# Patient Record
Sex: Male | Born: 1960 | Race: Black or African American | Hispanic: No | Marital: Single
Health system: Southern US, Community
[De-identification: ages and names within clinical notes are randomized; demographics above are authoritative.]

## PROBLEM LIST (undated history)

## (undated) DIAGNOSIS — I82409 Acute embolism and thrombosis of unspecified deep veins of unspecified lower extremity: Secondary | ICD-10-CM

## (undated) DIAGNOSIS — I10 Essential (primary) hypertension: Secondary | ICD-10-CM

## (undated) HISTORY — DX: Acute embolism and thrombosis of unspecified deep veins of unspecified lower extremity: I82.409

## (undated) HISTORY — DX: Essential (primary) hypertension: I10

---

## 2001-01-06 ENCOUNTER — Emergency Department (HOSPITAL_COMMUNITY): Admission: EM | Admit: 2001-01-06 | Discharge: 2001-01-06 | Payer: Self-pay

## 2002-10-10 ENCOUNTER — Encounter: Payer: Self-pay | Admitting: Emergency Medicine

## 2002-10-10 ENCOUNTER — Emergency Department (HOSPITAL_COMMUNITY): Admission: EM | Admit: 2002-10-10 | Discharge: 2002-10-10 | Payer: Self-pay | Admitting: Emergency Medicine

## 2017-12-03 ENCOUNTER — Emergency Department (HOSPITAL_COMMUNITY)
Admission: EM | Admit: 2017-12-03 | Discharge: 2017-12-03 | Disposition: A | Discharge status: Home / Self Care | Payer: Managed Care, Other (non HMO) | Attending: Physician Assistant | Admitting: Physician Assistant

## 2017-12-03 ENCOUNTER — Ambulatory Visit (HOSPITAL_BASED_OUTPATIENT_CLINIC_OR_DEPARTMENT_OTHER)
Admit: 2017-12-03 | Discharge: 2017-12-03 | Disposition: A | Discharge status: Home / Self Care | Payer: Managed Care, Other (non HMO)

## 2017-12-03 ENCOUNTER — Ambulatory Visit (HOSPITAL_COMMUNITY)
Admission: EM | Admit: 2017-12-03 | Discharge: 2017-12-03 | Disposition: A | Discharge status: Nursing Home (Includes ICF) | Payer: Managed Care, Other (non HMO) | Source: Home / Self Care | Attending: Internal Medicine | Admitting: Internal Medicine

## 2017-12-03 ENCOUNTER — Encounter (HOSPITAL_COMMUNITY): Payer: Self-pay | Admitting: Emergency Medicine

## 2017-12-03 ENCOUNTER — Encounter (HOSPITAL_COMMUNITY): Payer: Self-pay | Admitting: Physician Assistant

## 2017-12-03 ENCOUNTER — Other Ambulatory Visit: Payer: Self-pay

## 2017-12-03 DIAGNOSIS — Z833 Family history of diabetes mellitus: Secondary | ICD-10-CM

## 2017-12-03 DIAGNOSIS — I82411 Acute embolism and thrombosis of right femoral vein: Secondary | ICD-10-CM | POA: Insufficient documentation

## 2017-12-03 DIAGNOSIS — Z8249 Family history of ischemic heart disease and other diseases of the circulatory system: Secondary | ICD-10-CM | POA: Insufficient documentation

## 2017-12-03 DIAGNOSIS — F17200 Nicotine dependence, unspecified, uncomplicated: Secondary | ICD-10-CM | POA: Diagnosis not present

## 2017-12-03 DIAGNOSIS — F17210 Nicotine dependence, cigarettes, uncomplicated: Secondary | ICD-10-CM

## 2017-12-03 DIAGNOSIS — M7989 Other specified soft tissue disorders: Secondary | ICD-10-CM

## 2017-12-03 DIAGNOSIS — R2241 Localized swelling, mass and lump, right lower limb: Secondary | ICD-10-CM

## 2017-12-03 DIAGNOSIS — I82441 Acute embolism and thrombosis of right tibial vein: Secondary | ICD-10-CM | POA: Insufficient documentation

## 2017-12-03 DIAGNOSIS — I82431 Acute embolism and thrombosis of right popliteal vein: Secondary | ICD-10-CM

## 2017-12-03 LAB — CBC WITH DIFFERENTIAL/PLATELET
Basophils Absolute: 0.1 10*3/uL (ref 0.0–0.1)
Basophils Relative: 1 %
Eosinophils Absolute: 0.4 10*3/uL (ref 0.0–0.7)
Eosinophils Relative: 4 %
HCT: 41.2 % (ref 39.0–52.0)
Hemoglobin: 13.8 g/dL (ref 13.0–17.0)
Lymphocytes Relative: 37 %
Lymphs Abs: 3.7 10*3/uL (ref 0.7–4.0)
MCH: 29.7 pg (ref 26.0–34.0)
MCHC: 33.5 g/dL (ref 30.0–36.0)
MCV: 88.6 fL (ref 78.0–100.0)
Monocytes Absolute: 0.7 10*3/uL (ref 0.1–1.0)
Monocytes Relative: 7 %
Neutro Abs: 5.3 10*3/uL (ref 1.7–7.7)
Neutrophils Relative %: 51 %
Platelets: 350 10*3/uL (ref 150–400)
RBC: 4.65 MIL/uL (ref 4.22–5.81)
RDW: 13.6 % (ref 11.5–15.5)
WBC: 10 10*3/uL (ref 4.0–10.5)

## 2017-12-03 LAB — COMPREHENSIVE METABOLIC PANEL
ALK PHOS: 67 U/L (ref 38–126)
ALT: 28 U/L (ref 17–63)
ANION GAP: 8 (ref 5–15)
AST: 24 U/L (ref 15–41)
Albumin: 3.1 g/dL — ABNORMAL LOW (ref 3.5–5.0)
BILIRUBIN TOTAL: 0.5 mg/dL (ref 0.3–1.2)
BUN: 9 mg/dL (ref 6–20)
CALCIUM: 8.7 mg/dL — AB (ref 8.9–10.3)
CO2: 27 mmol/L (ref 22–32)
Chloride: 105 mmol/L (ref 101–111)
Creatinine, Ser: 0.88 mg/dL (ref 0.61–1.24)
Glucose, Bld: 88 mg/dL (ref 65–99)
Potassium: 3.6 mmol/L (ref 3.5–5.1)
SODIUM: 140 mmol/L (ref 135–145)
TOTAL PROTEIN: 7.2 g/dL (ref 6.5–8.1)

## 2017-12-03 LAB — PROTIME-INR
INR: 1.04
PROTHROMBIN TIME: 13.5 s (ref 11.4–15.2)

## 2017-12-03 MED ORDER — RIVAROXABAN (XARELTO) VTE STARTER PACK (15 & 20 MG): ORAL_TABLET | ORAL | 0 refills | Status: DC

## 2017-12-03 MED ORDER — RIVAROXABAN 15 MG PO TABS
15.0000 mg | ORAL_TABLET | Freq: Two times a day (BID) | ORAL | Status: DC
  Administered 2017-12-03: 15 mg via ORAL
  Filled 2017-12-03: qty 1

## 2017-12-03 MED ORDER — RIVAROXABAN (XARELTO) EDUCATION KIT FOR DVT/PE PATIENTS
PACK | Freq: Once | Status: AC
  Administered 2017-12-03: 17:00:00
  Filled 2017-12-03: qty 1

## 2017-12-03 NOTE — ED Triage Notes (Addendum)
PT seen at [facility] and sent to ED after vascular US showed....."acute DVT in the right femoral, popliteal, posterior tibial, and peroneal veins."  Reports R groin pain and R leg pain x 2 weeks.  Denies SOB.  Spoke with EDP about pt and orders received.

## 2017-12-03 NOTE — ED Notes (Signed)
Pharmacy to come down to review Xarelto packet with patient

## 2017-12-03 NOTE — ED Provider Notes (Signed)
[place]    CSN: [id number] Arrival date & time: 12/03/17  1204     History   Chief Complaint Chief Complaint  Patient presents with  . Leg Pain    HPI Nahum RAWN QUIROA is a 56 y.o. male.   56 year old male comes in for 1 week history of right leg swelling.  He states that whole right lower leg has been tight, taking Tylenol without relief.  He denies any injuries.  Is able to walk without problems.  He has had recent long travel to [place] in a train.  Current every day smoker, 2 packs/day, about 30 years.  Denies chest pain, shortness of breath, palpitations, weakness, dizziness.  Denies history of heart disease.  Denies erythema, has had increased warmth of the right leg.  Denies fever, chills, night sweats.      History reviewed. No pertinent past medical history.  There are no active problems to display for this patient.   History reviewed. No pertinent surgical history.     Home Medications    Prior to Admission medications   Medication Sig Start Date End Date Taking? Authorizing Provider  Rivaroxaban 15 & 20 MG TBPK Take as directed on package: Start with one 15mg  tablet by mouth twice a day with food. On Day 22, switch to one 20mg  tablet once a day with food. 12/03/17   Mackuen, Fredia Sorrow, MD    Family History Family History  Problem Relation Age of Onset  . Hypertension Mother   . Diabetes Mother     Social History Social History   Tobacco Use  . Smoking status: Current Every Day Smoker  . Smokeless tobacco: Never Used  Substance Use Topics  . Alcohol use: Yes  . Drug use: No     Allergies   Patient has no known allergies.   Review of Systems Review of Systems  Reason unable to perform ROS: See HPI as above.     Physical Exam Triage Vital Signs ED Triage Vitals [12/03/17 1234]  Enc Vitals Group     BP (!) 187/110     Pulse Rate 74     Resp 16     Temp 97.8 F (36.6 C)     Temp Source Oral     SpO2 95 %   Weight      Height      Head Circumference      Peak Flow      Pain Score      Pain Loc      Pain Edu?      Excl. in [place]?    No data found.  Updated Vital Signs BP (!) 187/110 (BP Location: Left Arm) Comment: Notified RN  Pulse 74   Temp 97.8 F (36.6 C) (Oral)   Resp 16   SpO2 95%    Physical Exam  Constitutional: He is oriented to person, place, and time. He appears well-developed and well-nourished. No distress.  HENT:  Head: Normocephalic and atraumatic.  Eyes: Conjunctivae are normal. Pupils are equal, round, and reactive to light.  Neck: Normal range of motion. Neck supple.  Cardiovascular: Normal rate, regular rhythm and normal heart sounds. Exam reveals no gallop and no friction rub.  No murmur heard. Pulmonary/Chest: Effort normal and breath sounds normal. He has no wheezes. He has no rales.  Musculoskeletal:  Right lower leg swelling.  No obvious erythema.  Increased warmth.  No tenderness to palpation.  +2 pitting edema up to the knee.  Full range of motion of knee and ankle.  Strength normal and equal bilaterally.  Sensation intact and equal bilaterally.  Negative Homans.  Right calf-- 18 in (1/3 below knee), 12 in (1/3 above ankle) Left calf-- 16 in (1/3 below knee), 10 in (1/3 above ankle)  Neurological: He is alert and oriented to person, place, and time.  Skin: Skin is warm and dry.     UC Treatments / Results  Labs (all labs ordered are listed, but only abnormal results are displayed) Labs Reviewed - No data to display  EKG  EKG Interpretation None       Radiology No results found.  Procedures Procedures (including critical care time)  Medications Ordered in UC Medications - No data to display   Initial Impression / Assessment and Plan / UC Course  I have reviewed the triage vital signs and the nursing notes.  Pertinent labs & imaging results that were available during my care of the patient were reviewed by me and considered in my  medical decision making (see chart for details).    Given history and exam, will discharge patient for DVT ultrasound.  Informed patient we will give further instructions after DVT results.  Patient expresses understanding and agrees to plan.  Contacted by vascular ultrasound tech, positive DVT results.  Patient informed to go to the emergency department for further evaluation.    Final Clinical Impressions(s) / UC Diagnoses   Final diagnoses:  Right leg swelling    ED Discharge Orders        Ordered    VAS [place] LOWER EXTREMITY VENOUS (DVT)     12/03/17 [place], Amy V, PA-C 12/03/17 1644

## 2017-12-03 NOTE — Discharge Instructions (Addendum)
You saw our case management, you will need to follow-up with them for primary care physician appointment.  Will be given the first little bit of Xarelto.  Please follow the instructions about this medication very carefully.  Do not take medication such as ibuprofen.  Follow-up with your primary care within 2 weeks.  Information on my medicine - XARELTO (rivaroxaban)  This medication education was reviewed with me or my healthcare representative as part of my discharge preparation.  WHY WAS XARELTO PRESCRIBED FOR YOU? Xarelto was prescribed to treat blood clots that may have been found in the veins of your legs (deep vein thrombosis) or in your lungs (pulmonary embolism) and to reduce the risk of them occurring again.  WHAT DO YOU NEED TO KNOW ABOUT XARELTO? The starting dose is one 15 mg tablet taken TWICE daily with food for the FIRST 21 DAYS then on 1/21  the dose is changed to one 20 mg tablet taken ONCE A DAY with your evening meal.  DO NOT stop taking Xarelto without talking to the health care provider who prescribed the medication.  Refill your prescription for 20 mg tablets before you run out.  After discharge, you should have regular check-up appointments with your healthcare provider that is prescribing your Xarelto.  In the future your dose may need to be changed if your kidney function changes by a significant amount.  WHAT DO YOU DO IF YOU MISS A DOSE? If you are taking Xarelto TWICE DAILY and you miss a dose, take it as soon as you remember. You may take two 15 mg tablets (total 30 mg) at the same time then resume your regularly scheduled 15 mg twice daily the next day.  If you are taking Xarelto ONCE DAILY and you miss a dose, take it as soon as you remember on the same day then continue your regularly scheduled once daily regimen the next day. Do not take two doses of Xarelto at the same time.   IMPORTANT SAFETY INFORMATION Xarelto is a blood thinner medicine that can  cause bleeding. You should call your healthcare provider right away if you experience any of the following: Bleeding from an injury or your nose that does not stop. Unusual colored urine (red or dark brown) or unusual colored stools (red or black). Unusual bruising for unknown reasons. A serious fall or if you hit your head (even if there is no bleeding).  Some medicines may interact with Xarelto and might increase your risk of bleeding while on Xarelto. To help avoid this, consult your healthcare provider or pharmacist prior to using any new prescription or non-prescription medications, including herbals, vitamins, non-steroidal anti-inflammatory drugs (NSAIDs) and supplements.  This website has more information on Xarelto: [email or web address]-benson.[email or web address]

## 2017-12-03 NOTE — Care Management (Signed)
ED CM consulted concerning patient being started on Xarelto and needing to establish primary health care. CM met with patient and wife at bedside patient confirmed information. CM discussed 30 day trail free Xarelto savings card and how to redeem. CM offered assistance with referral to PCP patient is agreeable. CM provided information for the [facility] on [address], Information placed on patient's AVS Patient verbalized understanding and teach back done. No further ED CM needs identified

## 2017-12-03 NOTE — Progress Notes (Signed)
VASCULAR LAB PRELIMINARY  PRELIMINARY  PRELIMINARY  PRELIMINARY  Right lower extremity venous duplex completed.    Preliminary report:  There is acute DVT in the right femoral, popliteal, posterior tibial, and peroneal veins.    Called report to Cathlean Sauer, PA-C who asked for patient to check in to ED.  Danyal Adorno, RVT 12/03/2017, 2:23 PM

## 2017-12-03 NOTE — ED Provider Notes (Signed)
[place] [facility] EMERGENCY DEPARTMENT Provider Note   CSN: [id number] Arrival date & time: 12/03/17  1432     History   Chief Complaint Chief Complaint  Patient presents with  . DVT    HPI Dustin Jones is a 56 y.o. male.  HPI   Patient is a 56 year old male presenting with DVT.  Patient was seen outpatient clinic and sent for ultrasound.  Ultrasound is positive.  Patient had recent 9-hour long trip to [place] on a train.  And then a subsequent 9-hour trip back down 2 days later.  Patient did not walk at all during this change it.  I believe that this is the provoking episode.  Patient has no PCP no allergies no past medical history.  History reviewed. No pertinent past medical history.  There are no active problems to display for this patient.   History reviewed. No pertinent surgical history.     Home Medications    Prior to Admission medications   Not on File    Family History Family History  Problem Relation Age of Onset  . Hypertension Mother   . Diabetes Mother     Social History Social History   Tobacco Use  . Smoking status: Current Every Day Smoker  . Smokeless tobacco: Never Used  Substance Use Topics  . Alcohol use: Yes  . Drug use: No     Allergies   Patient has no known allergies.   Review of Systems Review of Systems  Constitutional: Negative for fatigue and fever.  Respiratory: Negative for cough, chest tightness and shortness of breath.   Cardiovascular: Positive for leg swelling.  All other systems reviewed and are negative.    Physical Exam Updated Vital Signs BP (!) 200/119 (BP Location: Right Arm)   Pulse 66   Temp 98.2 F (36.8 C) (Oral)   Resp 16   Ht '5\' 11"'$  (1.803 m)   Wt 99.8 kg (220 lb)   SpO2 97%   BMI 30.68 kg/m   Physical Exam  Constitutional: He is oriented to person, place, and time. He appears well-nourished.  HENT:  Head: Normocephalic.  Eyes: Conjunctivae are normal.    Cardiovascular: Normal rate and regular rhythm.  Pulmonary/Chest: Effort normal and breath sounds normal. No stridor. No respiratory distress.  Musculoskeletal:  Right lower externally with swelling.  Neurological: He is oriented to person, place, and time. Sensory deficit:    Skin: Skin is warm and dry. He is not diaphoretic.  Psychiatric: He has a normal mood and affect. His behavior is normal.     ED Treatments / Results  Labs (all labs ordered are listed, but only abnormal results are displayed) Labs Reviewed  CBC WITH DIFFERENTIAL/PLATELET  COMPREHENSIVE METABOLIC PANEL  PROTIME-INR    EKG  EKG Interpretation None       Radiology No results found.  Procedures Procedures (including critical care time)  Medications Ordered in ED Medications  rivaroxaban (XARELTO) Education Kit for DVT/PE patients (not administered)  Rivaroxaban (XARELTO) tablet 15 mg (not administered)     Initial Impression / Assessment and Plan / ED Course  I have reviewed the triage vital signs and the nursing notes.  Pertinent labs & imaging results that were available during my care of the patient were reviewed by me and considered in my medical decision making (see chart for details).     Patient is a 56 year old male presenting with DVT.  Patient was seen outpatient clinic and sent for ultrasound.  Ultrasound  is positive.  Patient had recent 9-hour long trip to [place] on a train.  And then a subsequent 9-hour trip back down 2 days later.  Patient did not walk at all during this change it.  I believe that this is the provoking episode.  Patient has no PCP no allergies no past medical history.  4:15 PM Patient seems a good candidate for outpatient NOACt.  Will discuss with pharmacy and case management.  Receiving case management saw patient and he is agreeable for discharge and has a plan for outpatient care.  Final Clinical Impressions(s) / ED Diagnoses   Final diagnoses:  None     ED Discharge Orders    None       Macarthur Critchley, MD 12/03/17 2017

## 2017-12-03 NOTE — ED Notes (Signed)
Patient able to ambulate independently  

## 2017-12-03 NOTE — Discharge Instructions (Addendum)
Go to the ED for ultrasound of your leg.

## 2017-12-03 NOTE — ED Triage Notes (Signed)
Right leg is swollen, no injury.  Woke a week ago with right groin pain.  Leg is throbbing.  Right leg has pitting edema and is tight.

## 2017-12-04 LAB — POCT I-STAT, CHEM 8
BUN: 10 mg/dL (ref 6–20)
CHLORIDE: 102 mmol/L (ref 101–111)
Calcium, Ion: 1.16 mmol/L (ref 1.15–1.40)
Creatinine, Ser: 0.8 mg/dL (ref 0.61–1.24)
GLUCOSE: 105 mg/dL — AB (ref 65–99)
HEMATOCRIT: 41 % (ref 39.0–52.0)
HEMOGLOBIN: 13.9 g/dL (ref 13.0–17.0)
POTASSIUM: 4 mmol/L (ref 3.5–5.1)
SODIUM: 143 mmol/L (ref 135–145)
TCO2: 28 mmol/L (ref 22–32)

## 2017-12-20 ENCOUNTER — Ambulatory Visit (INDEPENDENT_AMBULATORY_CARE_PROVIDER_SITE_OTHER): Payer: Managed Care, Other (non HMO) | Admitting: Family Medicine

## 2017-12-20 ENCOUNTER — Encounter: Payer: Self-pay | Admitting: Family Medicine

## 2017-12-20 VITALS — BP 160/98 | Temp 98.2°F | Resp 16 | Ht 71.0 in | Wt 223.0 lb

## 2017-12-20 DIAGNOSIS — Z1211 Encounter for screening for malignant neoplasm of colon: Secondary | ICD-10-CM

## 2017-12-20 DIAGNOSIS — Z131 Encounter for screening for diabetes mellitus: Secondary | ICD-10-CM

## 2017-12-20 DIAGNOSIS — F17200 Nicotine dependence, unspecified, uncomplicated: Secondary | ICD-10-CM | POA: Diagnosis not present

## 2017-12-20 DIAGNOSIS — Z23 Encounter for immunization: Secondary | ICD-10-CM | POA: Diagnosis not present

## 2017-12-20 DIAGNOSIS — Z86718 Personal history of other venous thrombosis and embolism: Secondary | ICD-10-CM

## 2017-12-20 DIAGNOSIS — I10 Essential (primary) hypertension: Secondary | ICD-10-CM | POA: Diagnosis not present

## 2017-12-20 LAB — POCT URINALYSIS DIP (DEVICE)
BILIRUBIN URINE: NEGATIVE
GLUCOSE, UA: NEGATIVE mg/dL
Hgb urine dipstick: NEGATIVE
KETONES UR: NEGATIVE mg/dL
Leukocytes, UA: NEGATIVE
Nitrite: NEGATIVE
PROTEIN: NEGATIVE mg/dL
SPECIFIC GRAVITY, URINE: 1.02 (ref 1.005–1.030)
Urobilinogen, UA: 0.2 mg/dL (ref 0.0–1.0)
pH: 7.5 (ref 5.0–8.0)

## 2017-12-20 LAB — POCT GLYCOSYLATED HEMOGLOBIN (HGB A1C): Hemoglobin A1C: 5.6

## 2017-12-20 MED ORDER — AMLODIPINE BESYLATE 5 MG PO TABS: 5.0000 mg | ORAL_TABLET | Freq: Every day | ORAL | 5 refills | Status: DC

## 2017-12-20 MED ORDER — RIVAROXABAN 20 MG PO TABS: 20.0000 mg | ORAL_TABLET | Freq: Every day | ORAL | 4 refills | Status: DC

## 2017-12-20 NOTE — Patient Instructions (Addendum)
Patient has a history of lower extremity DVT, will continue Xarelto as previously prescribed.  You will be on Xarelto for a total of 6 months.  Take medication consistently, try not to skip doses.  Will check your creatinine level every 3 months while on Xarelto.  Continue to look for signs of bleeding (urine, stools, gums). Your blood pressure is 168/98, which is above goal.  With hypertension, blood pressure goal is less than 140/90.  We will start a trial of amlodipine 5 mg daily with breakfast.  Check blood pressures periodically.  You will return in 1 week for blood pressure check make sure that you take your medication prior to arrival.  He will follow-up in 1 month for hypertension, and we will do a prostate exam at that time. I have sent a referral to gastroenterology for a consult for screening colonoscopy. I have also sent a referral to ophthalmology for routine eye exam due to your history of hypertension.  - Continue medication, monitor blood pressure at home. Continue DASH diet. Reminder to go to the ER if any CP, SOB, nausea, dizziness, severe HA, changes vision/speech, left arm numbness and tingling and jaw pain.       DASH Eating Plan DASH stands for "Dietary Approaches to Stop Hypertension." The DASH eating plan is a healthy eating plan that has been shown to reduce high blood pressure (hypertension). It may also reduce your risk for type 2 diabetes, heart disease, and stroke. The DASH eating plan may also help with weight loss. What are tips for following this plan? General guidelines  Avoid eating more than 2,300 mg (milligrams) of salt (sodium) a day. If you have hypertension, you may need to reduce your sodium intake to 1,500 mg a day.  Limit alcohol intake to no more than 1 drink a day for nonpregnant women and 2 drinks a day for men. One drink equals 12 oz of beer, 5 oz of wine, or 1 oz of hard liquor.  Work with your health care provider to maintain a healthy body  weight or to lose weight. Ask what an ideal weight is for you.  Get at least 30 minutes of exercise that causes your heart to beat faster (aerobic exercise) most days of the week. Activities may include walking, swimming, or biking.  Work with your health care provider or diet and nutrition specialist (dietitian) to adjust your eating plan to your individual calorie needs. Reading food labels  Check food labels for the amount of sodium per serving. Choose foods with less than 5 percent of the Daily Value of sodium. Generally, foods with less than 300 mg of sodium per serving fit into this eating plan.  To find whole grains, look for the word "whole" as the first word in the ingredient list. Shopping  Buy products labeled as "low-sodium" or "no salt added."  Buy fresh foods. Avoid canned foods and premade or frozen meals. Cooking  Avoid adding salt when cooking. Use salt-free seasonings or herbs instead of table salt or sea salt. Check with your health care provider or pharmacist before using salt substitutes.  Do not fry foods. Cook foods using healthy methods such as baking, boiling, grilling, and broiling instead.  Cook with heart-healthy oils, such as olive, canola, soybean, or sunflower oil. Meal planning   Eat a balanced diet that includes: ? 5 or more servings of fruits and vegetables each day. At each meal, try to fill half of your plate with fruits and vegetables. ?  Up to 6-8 servings of whole grains each day. ? Less than 6 oz of lean meat, poultry, or fish each day. A 3-oz serving of meat is about the same size as a deck of cards. One egg equals 1 oz. ? 2 servings of low-fat dairy each day. ? A serving of nuts, seeds, or beans 5 times each week. ? Heart-healthy fats. Healthy fats called Omega-3 fatty acids are found in foods such as flaxseeds and coldwater fish, like sardines, salmon, and mackerel.  Limit how much you eat of the following: ? Canned or prepackaged  foods. ? Food that is high in trans fat, such as fried foods. ? Food that is high in saturated fat, such as fatty meat. ? Sweets, desserts, sugary drinks, and other foods with added sugar. ? Full-fat dairy products.  Do not salt foods before eating.  Try to eat at least 2 vegetarian meals each week.  Eat more home-cooked food and less restaurant, buffet, and fast food.  When eating at a restaurant, ask that your food be prepared with less salt or no salt, if possible. What foods are recommended? The items listed may not be a complete list. Talk with your dietitian about what dietary choices are best for you. Grains Whole-grain or whole-wheat bread. Whole-grain or whole-wheat pasta. Brown rice. [place]. Bulgur. Whole-grain and low-sodium cereals. Pita bread. Low-fat, low-sodium crackers. Whole-wheat flour tortillas. Vegetables Fresh or frozen vegetables (raw, steamed, roasted, or grilled). Low-sodium or reduced-sodium tomato and vegetable juice. Low-sodium or reduced-sodium tomato sauce and tomato paste. Low-sodium or reduced-sodium canned vegetables. Fruits All fresh, dried, or frozen fruit. Canned fruit in natural juice (without added sugar). Meat and other protein foods Skinless chicken or [place]. Ground chicken or [place]. Pork with fat trimmed off. Fish and seafood. Egg whites. Dried beans, peas, or lentils. Unsalted nuts, nut butters, and seeds. Unsalted canned beans. Lean cuts of beef with fat trimmed off. Low-sodium, lean deli meat. Dairy Low-fat (1%) or fat-free (skim) milk. Fat-free, low-fat, or reduced-fat cheeses. Nonfat, low-sodium ricotta or cottage cheese. Low-fat or nonfat yogurt. Low-fat, low-sodium cheese. Fats and oils Soft margarine without trans fats. Vegetable oil. Low-fat, reduced-fat, or light mayonnaise and salad dressings (reduced-sodium). Canola, safflower, olive, soybean, and sunflower oils. Avocado. Seasoning and other foods Herbs. Spices. Seasoning  mixes without salt. Unsalted popcorn and pretzels. Fat-free sweets. What foods are not recommended? The items listed may not be a complete list. Talk with your dietitian about what dietary choices are best for you. Grains Baked goods made with fat, such as croissants, muffins, or some breads. Dry pasta or rice meal packs. Vegetables Creamed or fried vegetables. Vegetables in a cheese sauce. Regular canned vegetables (not low-sodium or reduced-sodium). Regular canned tomato sauce and paste (not low-sodium or reduced-sodium). Regular tomato and vegetable juice (not low-sodium or reduced-sodium). Angie Fava. Olives. Fruits Canned fruit in a light or heavy syrup. Fried fruit. Fruit in cream or butter sauce. Meat and other protein foods Fatty cuts of meat. Ribs. Fried meat. Berniece Salines. Sausage. Bologna and other processed lunch meats. Salami. Fatback. Hotdogs. Bratwurst. Salted nuts and seeds. Canned beans with added salt. Canned or smoked fish. Whole eggs or egg yolks. Chicken or [place] with skin. Dairy Whole or 2% milk, cream, and half-and-half. Whole or full-fat cream cheese. Whole-fat or sweetened yogurt. Full-fat cheese. Nondairy creamers. Whipped toppings. Processed cheese and cheese spreads. Fats and oils Butter. Stick margarine. Lard. Shortening. Ghee. Bacon fat. Tropical oils, such as coconut, palm kernel, or palm oil.  Seasoning and other foods Salted popcorn and pretzels. Onion salt, garlic salt, seasoned salt, table salt, and sea salt. Worcestershire sauce. Tartar sauce. Barbecue sauce. Teriyaki sauce. Soy sauce, including reduced-sodium. Steak sauce. Canned and packaged gravies. Fish sauce. Oyster sauce. Cocktail sauce. Horseradish that you find on the shelf. Ketchup. Mustard. Meat flavorings and tenderizers. Bouillon cubes. Hot sauce and Tabasco sauce. Premade or packaged marinades. Premade or packaged taco seasonings. Relishes. Regular salad dressings. Where to find more information:  National  Heart, Lung, and [place]: https://wilson-eaton.com/  American Heart Association: www.heart.org Summary  The DASH eating plan is a healthy eating plan that has been shown to reduce high blood pressure (hypertension). It may also reduce your risk for type 2 diabetes, heart disease, and stroke.  With the DASH eating plan, you should limit salt (sodium) intake to 2,300 mg a day. If you have hypertension, you may need to reduce your sodium intake to 1,500 mg a day.  When on the DASH eating plan, aim to eat more fresh fruits and vegetables, whole grains, lean proteins, low-fat dairy, and heart-healthy fats.  Work with your health care provider or diet and nutrition specialist (dietitian) to adjust your eating plan to your individual calorie needs. This information is not intended to replace advice given to you by your health care provider. Make sure you discuss any questions you have with your health care provider. Document Released: 11/10/2011 Document Revised: 11/14/2016 Document Reviewed: 11/14/2016 Elsevier Interactive Patient Education  [employer].

## 2017-12-20 NOTE — Progress Notes (Signed)
Subjective:    Patient ID: Dustin Jones, male    DOB: 07-03-1961, 57 y.o.   MRN: [id number]  HPI Dustin Jones, a 57 year old male presents to establish care.  Patient states that he has been generally healthy and has not had a primary provider.  Patient was evaluated in the emergency department and urgent care on 12/03/2017.  He states that he had a long train ride to [place] that was greater than 8 hours.  After arriving home patient noticed that he had extreme right leg swelling.  He reported to urgent care and was transition to the emergency department.  Patient was found to have an acute DVT to the right femoral vein.  No right femoral obstruction was noted.  Patient was discharged home on anticoagulation therapy.  He has been taking Xarelto consistently since hospital discharge.  Patient was also noted to have an elevated blood pressure during previous emergency room visit.  He was not started on antihypertensive therapy at the time and has not been diagnosed with hypertension.  He currently denies dizziness, chest pains, heart palpitations, syncope, nausea, vomiting, or dysuria.  Patient endorses swelling to right lower extremity.  Patient is a chronic everyday tobacco user.  He states that he has been smoking since he was 57 years old.  He states that he smokes 1/2 pack of cigarettes per day. Past Medical History:  Diagnosis Date  . DVT, lower extremity ([place])   . Hypertension    Social History   Socioeconomic History  . Marital status: Single    Spouse name: Not on file  . Number of children: Not on file  . Years of education: Not on file  . Highest education level: Not on file  Social Needs  . Financial resource strain: Not on file  . Food insecurity - worry: Not on file  . Food insecurity - inability: Not on file  . Transportation needs - medical: Not on file  . Transportation needs - non-medical: Not on file  Occupational History  . Not on file  Tobacco Use  .  Smoking status: Current Every Day Smoker    Packs/day: 0.50  . Smokeless tobacco: Never Used  Substance and Sexual Activity  . Alcohol use: No    Frequency: Never  . Drug use: No  . Sexual activity: No  Other Topics Concern  . Not on file  Social History Narrative  . Not on file   Immunization History  Administered Date(s) Administered  . Pneumococcal Polysaccharide-23 12/20/2017  . Tdap 12/20/2017   Review of Systems  Constitutional: Negative.  Negative for fatigue, fever and unexpected weight change.  Eyes: Negative for photophobia, redness and visual disturbance.  Respiratory: Negative.   Cardiovascular: Positive for leg swelling.  Endocrine: Negative for polydipsia, polyphagia and polyuria.  Genitourinary: Negative.   Musculoskeletal: Negative.   Allergic/Immunologic: Negative.   Neurological: Negative.   Hematological: Negative.   Psychiatric/Behavioral: Negative.        Objective:   Physical Exam BP (!) 160/98 (BP Location: Left Arm, Patient Position: Sitting, Cuff Size: Normal) Comment: manually  Temp 98.2 F (36.8 C) (Oral)   Resp 16   Ht 5\' 11"  (1.803 m)   Wt 223 lb (101.2 kg)   SpO2 97%   BMI 31.10 kg/m   General Appearance:    Alert, cooperative, no distress, appears stated age  Head:    Normocephalic, without obvious abnormality, atraumatic  Eyes:    PERRL, conjunctiva/corneas clear, EOM's intact,  fundi    benign, both eyes       Ears:    Normal TM's and external ear canals, both ears  Nose:   Nares normal, septum midline, mucosa normal, no drainage   or sinus tenderness  Throat:   Lips, mucosa, and tongue normal; teeth and gums normal  Neck:   Supple, symmetrical, trachea midline, no adenopathy;       thyroid:  No enlargement/tenderness/nodules; no carotid   bruit or JVD  Back:     Symmetric, no curvature, ROM normal, no CVA tenderness  Lungs:     Clear to auscultation bilaterally, respirations unlabored  Chest wall:    No tenderness or  deformity  Heart:    Regular rate and rhythm, S1 and S2 normal, no murmur, rub   or gallop  Abdomen:     Soft, non-tender, bowel sounds active all four quadrants,    no masses, no organomegaly  Extremities:   Extremities normal, atraumatic, no cyanosis. Right lower extremity, 2 + pitting edema  Pulses:   2+ and symmetric all extremities  Skin:   Skin color, texture, turgor normal, no rashes or lesions  Lymph nodes:   Cervical, supraclavicular, and axillary nodes normal  Neurologic:   CNII-XII intact. Normal strength, sensation and reflexes      throughout       BP (!) 160/98 (BP Location: Left Arm, Patient Position: Sitting, Cuff Size: Normal) Comment: manually  Temp 98.2 F (36.8 C) (Oral)   Resp 16   Ht 5\' 11"  (1.803 m)   Wt 223 lb (101.2 kg)   SpO2 97%   BMI 31.10 kg/m  Assessment & Plan:  1. Essential hypertension Blood pressure is above goal at 160/98.  We will start a trial of amlodipine 5 mg daily.  Will return in 1 week for blood pressure check.  Review urinalysis, no proteinuria present.  Will review renal functioning as results become available - Comprehensive metabolic panel - Ambulatory referral to Ophthalmology - TSH - Microalbumin/Creatinine Ratio, Urine  2. Diabetes mellitus screening - HgB A1c  3. Colon cancer screening - Ambulatory referral to Gastroenterology  4. History of DVT of lower extremity We will continue Xarelto for 6 months as prescribed.  We will also review creatinine level as results become available  5. Tobacco dependence Smoking cessation instruction/counseling given:  counseled patient on the dangers of tobacco use, advised patient to stop smoking, and reviewed strategies to maximize success  6. Need for Tdap vaccination - Tdap vaccine greater than or equal to 7yo IM  7. Immunization due - Pneumococcal polysaccharide vaccine 23-valent greater than or equal to 2yo subcutaneous/IM   RTC: One week for blood pressure check in 1 month for  hypertension and prostate exam   Dustin Pounds  MSN, FNP-C [facility] [address] [place], [place] [address] [phone]

## 2017-12-21 LAB — COMPREHENSIVE METABOLIC PANEL
ALT: 22 IU/L (ref 0–44)
AST: 23 IU/L (ref 0–40)
Albumin/Globulin Ratio: 1.3 (ref 1.2–2.2)
Albumin: 4.1 g/dL (ref 3.5–5.5)
Alkaline Phosphatase: 76 IU/L (ref 39–117)
BILIRUBIN TOTAL: 0.3 mg/dL (ref 0.0–1.2)
BUN/Creatinine Ratio: 10 (ref 9–20)
BUN: 10 mg/dL (ref 6–24)
CHLORIDE: 101 mmol/L (ref 96–106)
CO2: 27 mmol/L (ref 20–29)
CREATININE: 0.98 mg/dL (ref 0.76–1.27)
Calcium: 9.2 mg/dL (ref 8.7–10.2)
GFR calc non Af Amer: 86 mL/min/{1.73_m2} (ref >59)
GFR, EST AFRICAN AMERICAN: 99 mL/min/{1.73_m2} (ref >59)
Globulin, Total: 3.1 g/dL (ref 1.5–4.5)
Glucose: 98 mg/dL (ref 65–99)
Potassium: 4.4 mmol/L (ref 3.5–5.2)
Sodium: 140 mmol/L (ref 134–144)
TOTAL PROTEIN: 7.2 g/dL (ref 6.0–8.5)

## 2017-12-21 LAB — MICROALBUMIN / CREATININE URINE RATIO
Creatinine, Urine: 86.7 mg/dL
MICROALBUM., U, RANDOM: 21.9 ug/mL
Microalb/Creat Ratio: 25.3 mg/g creat (ref 0.0–30.0)

## 2017-12-21 LAB — TSH: TSH: 3.56 u[IU]/mL (ref 0.450–4.500)

## 2017-12-27 MED FILL — AMLODIPINE BESYLATE 5 MG TA: 5 | 30 days supply | Qty: 30 | Fill #0

## 2017-12-27 MED FILL — XARELTO 20 MG TABLET: 20 | 30 days supply | Qty: 30 | Fill #0

## 2018-01-03 ENCOUNTER — Ambulatory Visit: Payer: Managed Care, Other (non HMO)

## 2018-01-03 ENCOUNTER — Other Ambulatory Visit: Payer: Self-pay | Admitting: Family Medicine

## 2018-01-03 VITALS — BP 158/90

## 2018-01-03 DIAGNOSIS — I10 Essential (primary) hypertension: Secondary | ICD-10-CM

## 2018-01-03 MED ORDER — AMLODIPINE BESYLATE 10 MG PO TABS: 10.0000 mg | ORAL_TABLET | Freq: Every day | ORAL | 5 refills | Status: DC

## 2018-01-03 NOTE — Progress Notes (Signed)
  Dustin Jones, a 57 year old male with a history of hypertension presented for a bp check. Blood pressure remains above goal. Will increase Amlodipine to the following:   Meds ordered this encounter  Medications  . amLODipine (NORVASC) 10 MG tablet    Sig: Take 1 tablet (10 mg total) by mouth daily.    Dispense:  30 tablet    Refill:  5    Will follow up in 1 week for bp check.     [name]  MSN, FNP-C [facility] [address] [place], [place] [address] [phone]

## 2018-01-10 ENCOUNTER — Ambulatory Visit (INDEPENDENT_AMBULATORY_CARE_PROVIDER_SITE_OTHER): Payer: Managed Care, Other (non HMO) | Admitting: Family Medicine

## 2018-01-10 VITALS — BP 124/86 | HR 64

## 2018-01-10 DIAGNOSIS — I10 Essential (primary) hypertension: Secondary | ICD-10-CM | POA: Diagnosis not present

## 2018-01-10 NOTE — Progress Notes (Signed)
Patient blood pressure was stable and he was advise no changes to his medication.

## 2018-01-15 ENCOUNTER — Other Ambulatory Visit: Payer: Self-pay

## 2018-01-15 DIAGNOSIS — I10 Essential (primary) hypertension: Secondary | ICD-10-CM

## 2018-01-15 MED ORDER — AMLODIPINE BESYLATE 10 MG PO TABS: 10.0000 mg | ORAL_TABLET | Freq: Every day | ORAL | 5 refills | Status: AC

## 2018-01-17 ENCOUNTER — Encounter (HOSPITAL_COMMUNITY): Payer: Self-pay | Admitting: Emergency Medicine

## 2018-01-17 ENCOUNTER — Emergency Department (HOSPITAL_COMMUNITY): Payer: Managed Care, Other (non HMO)

## 2018-01-17 ENCOUNTER — Emergency Department (HOSPITAL_COMMUNITY)
Admission: EM | Admit: 2018-01-17 | Discharge: 2018-01-17 | Disposition: A | Discharge status: Home / Self Care | Payer: Managed Care, Other (non HMO) | Attending: Emergency Medicine | Admitting: Emergency Medicine

## 2018-01-17 ENCOUNTER — Other Ambulatory Visit: Payer: Self-pay

## 2018-01-17 DIAGNOSIS — S8392XA Sprain of unspecified site of left knee, initial encounter: Secondary | ICD-10-CM

## 2018-01-17 DIAGNOSIS — F17200 Nicotine dependence, unspecified, uncomplicated: Secondary | ICD-10-CM | POA: Insufficient documentation

## 2018-01-17 DIAGNOSIS — X509XXA Other and unspecified overexertion or strenuous movements or postures, initial encounter: Secondary | ICD-10-CM | POA: Insufficient documentation

## 2018-01-17 DIAGNOSIS — Y9301 Activity, walking, marching and hiking: Secondary | ICD-10-CM | POA: Diagnosis not present

## 2018-01-17 DIAGNOSIS — S8992XA Unspecified injury of left lower leg, initial encounter: Secondary | ICD-10-CM | POA: Diagnosis present

## 2018-01-17 DIAGNOSIS — Z7901 Long term (current) use of anticoagulants: Secondary | ICD-10-CM | POA: Insufficient documentation

## 2018-01-17 DIAGNOSIS — I10 Essential (primary) hypertension: Secondary | ICD-10-CM | POA: Diagnosis not present

## 2018-01-17 DIAGNOSIS — Z79899 Other long term (current) drug therapy: Secondary | ICD-10-CM | POA: Insufficient documentation

## 2018-01-17 DIAGNOSIS — Y9289 Other specified places as the place of occurrence of the external cause: Secondary | ICD-10-CM | POA: Diagnosis not present

## 2018-01-17 DIAGNOSIS — Y999 Unspecified external cause status: Secondary | ICD-10-CM | POA: Diagnosis not present

## 2018-01-17 MED ORDER — ACETAMINOPHEN 325 MG PO TABS
650.0000 mg | ORAL_TABLET | Freq: Once | ORAL | Status: AC
  Administered 2018-01-17: 650 mg via ORAL
  Filled 2018-01-17: qty 2

## 2018-01-17 NOTE — Discharge Instructions (Signed)
As we discussed today, your x-rays did not reveal any broken bones.  But there is concern for possible tendon or ligament injury.  This will need to be evaluated by orthopedic doctor which I have provided in the referral section.  Call their office tomorrow to arrange for an appointment.  Follow the RICE (Rest, Ice, Compression, Elevation) protocol as directed.   You can take Tylenol or Ibuprofen as directed for pain. You can alternate Tylenol and Ibuprofen every 4 hours. If you take Tylenol at 1pm, then you can take Ibuprofen at 5pm. Then you can take Tylenol again at 9pm.   Return to the emergency department for any worsening pain, redness or swelling of the knee, fevers, numbness/weakness of the foot or any other worsening or concerning symptoms.

## 2018-01-17 NOTE — ED Provider Notes (Signed)
[facility] EMERGENCY DEPARTMENT Provider Note   CSN: [id number] Arrival date & time: 01/17/18  1327     History   Chief Complaint Chief Complaint  Patient presents with  . Knee Injury    HPI Dustin Jones is a 57 y.o. male with PMH/o HTN, DVT (currently on Xarelto) who presents for evaluation of left knee pain that began last night at approximately 6 pm. Patient reports that he was walking down a hill when his foot got caught, causing his knee to twist around.  He reports at that time he heard a pop sensation.  He states that he was still able to bear weight on the leg after the incident.  He reports swelling and pain to the anterior aspect of the left knee.  Patient reports that he has not taken any medications for the pain.  He is still able to ambulate but with worsening pain.  He denies any overlying warmth, erythema, fevers, numbness.  The history is provided by the patient.    Past Medical History:  Diagnosis Date  . DVT, lower extremity ([place])   . Hypertension     Patient Active Problem List   Diagnosis Date Noted  . Essential hypertension 12/20/2017    History reviewed. No pertinent surgical history.     Home Medications    Prior to Admission medications   Medication Sig Start Date End Date Taking? Authorizing Provider  amLODipine (NORVASC) 10 MG tablet Take 1 tablet (10 mg total) by mouth daily. 01/15/18   Scot Jun, FNP  rivaroxaban (XARELTO) 20 MG TABS tablet Take 1 tablet (20 mg total) by mouth daily with supper. 12/20/17   Dorena Dew, FNP  Rivaroxaban 15 & 20 MG TBPK Take as directed on package: Start with one 15mg  tablet by mouth twice a day with food. On Day 22, switch to one 20mg  tablet once a day with food. 12/03/17   Mackuen, Fredia Sorrow, MD    Family History Family History  Problem Relation Age of Onset  . Hypertension Mother   . Diabetes Mother     Social History Social History   Tobacco Use  . Smoking  status: Current Every Day Smoker    Packs/day: 0.50  . Smokeless tobacco: Never Used  Substance Use Topics  . Alcohol use: No    Frequency: Never  . Drug use: No     Allergies   Patient has no known allergies.   Review of Systems Review of Systems  Constitutional: Negative for fever.  Musculoskeletal:       Knee Pain  Skin: Negative for color change.  Neurological: Negative for weakness and numbness.     Physical Exam Updated Vital Signs BP (!) 136/103 (BP Location: Right Arm)   Pulse 65   Temp 98.6 F (37 C) (Oral)   Resp 16   Ht 6' (1.829 m)   Wt 101.2 kg (223 lb)   SpO2 99%   BMI 30.24 kg/m   Physical Exam  Constitutional: He appears well-developed and well-nourished.  Sitting comfortably on examination table  HENT:  Head: Normocephalic and atraumatic.  Eyes: Conjunctivae and EOM are normal. Right eye exhibits no discharge. Left eye exhibits no discharge. No scleral icterus.  Cardiovascular:  Pulses:      Dorsalis pedis pulses are 2+ on the right side, and 2+ on the left side.  Pulmonary/Chest: Effort normal.  Musculoskeletal:  Tenderness palpation overlying the anterior aspect of the left knee with overlying soft  tissue swelling.  No warmth or erythema noted.  Flexion/extension intact.  Patient is unable to hold the left lower extremity in extension against gravity.  Negative posterior and anterior drawer test.  No instability noted on varus or valgus stress.  No abnormalities of the right lower extremity.  No tenderness palpation to bilateral ankles.  No deformity or crepitus noted.  Neurological: He is alert.  Skin: Skin is warm and dry.  Psychiatric: He has a normal mood and affect. His speech is normal and behavior is normal.  Nursing note and vitals reviewed.    ED Treatments / Results  Labs (all labs ordered are listed, but only abnormal results are displayed) Labs Reviewed - No data to display  EKG  EKG Interpretation None        Radiology Dg Knee Complete 4 Views Left  Result Date: 01/17/2018 CLINICAL DATA:  Left knee pain. EXAM: LEFT KNEE - COMPLETE 4+ VIEW COMPARISON:  None. FINDINGS: There is no fracture or dislocation. There is prominent soft tissue swelling overlying the patella and distal quadriceps tendon. Are some dystrophic calcifications in the distal quadriceps tendon. Joint effusion. Tiny marginal osteophyte on the lower pole of the patella. IMPRESSION: Probable rupture of the distal quadriceps tendon.  Joint effusion. Electronically Signed   By: Lorriane Shire M.D.   On: 01/17/2018 16:24    Procedures Procedures (including critical care time)  Medications Ordered in ED Medications  acetaminophen (TYLENOL) tablet 650 mg (650 mg Oral Given 01/17/18 1550)     Initial Impression / Assessment and Plan / ED Course  I have reviewed the triage vital signs and the nursing notes.  Pertinent labs & imaging results that were available during my care of the patient were reviewed by me and considered in my medical decision making (see chart for details).     57 y.o. male past medical history of hypertension and DVT who presents for evaluation of left knee pain that began after a mechanical fall yesterday.  He was walking down a hill when his foot got caught in the ground, causing his knee to completely twist and turn.  At that time, he heard a pop sensation.  Has been able to ambulate but with worsening pain.  No numbness, fevers, warmth, redness. Patient is afebrile, non-toxic appearing, sitting comfortably on examination table. Vital signs reviewed and stable. Patient is neurovascularly intact. On exam, patient has tenderness to the anterior aspect of his left knee.  There does appear to be some overlying soft tissue swelling consistent with an effusion.  No warmth or erythema that would be suggestive of septic arthritis.  Patient is unable to hold his left lower extremity in extension against gravity.   History/physical exam is concerning for a tendon rupture versus ligament sprain.  X-rays ordered at triage.  Patient is currently undergoing treatment for DVT of the right lower extremity.  Patient has equal DP pulses in bilateral lower extremities and good distal cap refill.  History/physical exam not concerning for DVT of left lower extremity, septic arthritis, acute arterial embolism.  X-rays reviewed.  Negative for any acute fracture dislocation.  Discussed results with patient.  Given findings on x-ray and given patient's findings on exam that appear to be consistent with tendon injury, will plan to treat as tendon rupture.  Patient instructed to follow-up with referred outpatient orthopedic doctor.  Conservatives therapies discussed with patient. Patient had ample opportunity for questions and discussion. All patient's questions were answered with full understanding. Strict return precautions  discussed. Patient expresses understanding and agreement to plan.     Final Clinical Impressions(s) / ED Diagnoses   Final diagnoses:  Sprain of left knee, unspecified ligament, initial encounter    ED Discharge Orders    None       Desma Mcgregor 01/17/18 1826    Hayden Rasmussen, MD 01/18/18 1134

## 2018-01-17 NOTE — ED Provider Notes (Signed)
  Patient placed in Quick Look pathway, seen and evaluated for chief complaint of left knee pain after an injury today.   Pertinent H&P findings include mild swelling to left anterior knee. Speech is clear and coherent, non-toxic appearing, no respiratory distress.  .  Based on initial evaluation, labs are not indicated and radiology studies are indicated.  Patient counseled on process, plan, and necessity for staying for completing the evaluation.  Vitals:   01/17/18 1520  BP: (!) 157/103  Pulse: 79  Resp: 16  Temp: 98.6 F (37 C)  TempSrc: Oral  SpO2: 96%      Waynetta Pean, PA-C 01/17/18 1545    Hayden Rasmussen, MD 01/18/18 1133

## 2018-01-17 NOTE — Progress Notes (Signed)
Orthopedic Tech Progress Note Patient Details:  Dustin Jones Aug 31, 1961 [id number]  Ortho Devices Type of Ortho Device: Crutches, Knee Immobilizer Ortho Device/Splint Location: LLE Ortho Device/Splint Interventions: Ordered, Application   Post Interventions Patient Tolerated: Well Instructions Provided: Care of device   Braulio Bosch 01/17/2018, 5:57 PM

## 2018-01-17 NOTE — ED Triage Notes (Signed)
Pt presents with L knee pain after walking to job; pt states felt a pop; swelling noted

## 2018-01-22 ENCOUNTER — Encounter: Payer: Self-pay | Admitting: Family Medicine

## 2018-01-22 ENCOUNTER — Ambulatory Visit (INDEPENDENT_AMBULATORY_CARE_PROVIDER_SITE_OTHER): Payer: Self-pay | Admitting: Physician Assistant

## 2018-01-22 ENCOUNTER — Encounter (INDEPENDENT_AMBULATORY_CARE_PROVIDER_SITE_OTHER): Payer: Self-pay | Admitting: Physician Assistant

## 2018-01-22 ENCOUNTER — Ambulatory Visit (INDEPENDENT_AMBULATORY_CARE_PROVIDER_SITE_OTHER): Payer: Managed Care, Other (non HMO) | Admitting: Physician Assistant

## 2018-01-22 DIAGNOSIS — S76112A Strain of left quadriceps muscle, fascia and tendon, initial encounter: Secondary | ICD-10-CM | POA: Insufficient documentation

## 2018-01-22 DIAGNOSIS — M25562 Pain in left knee: Secondary | ICD-10-CM | POA: Diagnosis not present

## 2018-01-22 DIAGNOSIS — G8929 Other chronic pain: Secondary | ICD-10-CM | POA: Diagnosis not present

## 2018-01-22 NOTE — Progress Notes (Signed)
Office Visit Note   Patient: Dustin Jones           Date of Birth: 08/12/1961           MRN: [id number] Visit Date: 01/22/2018              Requested by: Dustin Dew, FNP [address], [place] [address] PCP: Dustin Dew, FNP   Assessment & Plan: Visit Diagnoses:  1. Chronic pain of left knee   2. Quadriceps tendon rupture, left, initial encounter     Plan: Impression is left knee acute quadriceps tendon rupture.  At this point we will have Dustin Jones continue in his knee immobilizer.  We will obtain an MRI of the left knee, and he will follow-up with [place] after.  He will call with concerns or questions in the meantime.  Follow-Up Instructions: Return in about 10 days (around 02/01/2018) for after mri.   Orders:  Orders Placed This Encounter  Procedures  . MR Knee Left w/o contrast   No orders of the defined types were placed in this encounter.     Procedures: No procedures performed   Clinical Data: No additional findings.   Subjective: Chief Complaint  Patient presents with  . Left Knee - Pain    HPI Dustin Jones is a pleasant 57 year old gentleman new patient who presents to our clinic today with a new injury to his left knee.  On 01/16/2018, he was in the parking lot at work walking down the hill to the building when he slipped twisting and flexing his left knee.  He had immediate pain and swelling.  He was unable to get up off the ground.  He was seen in the ED following day where x-rays were obtained.  These were negative for fracture/dislocation.  He was placed in a knee immobilizer and told to follow-up with [place].  He comes in today for follow-up.  He has pain to the anterior aspect of his knee.  This is constant but is tolerable.  This is aggravated with walking.  He is unable to walk without his knee immobilizer.  Of note, he is on Xarelto for a blood clot he developed to the right lower extremity back in December 2019 following a train ride to  [place].  He is supposed to be on this for 6 months.  Review of Systems as detailed in HPI.  All others are negative.   Objective: Vital Signs: There were no vitals taken for this visit.  Physical Exam well-developed well-nourished gentleman no acute distress.  Alert and oriented x3.  Ortho Exam examination of his left knee reveals a 2+ effusion.  Minimal tenderness throughout.  He does have a palpable defect to the distal quad tendon.  He is unable to actively straighten his leg.  Is unable to straight leg raise.  No calf tenderness.  He is neurovascular intact distally.  Specialty Comments:  No specialty comments available.  Imaging: No new imaging today   PMFS History: Patient Active Problem List   Diagnosis Date Noted  . Quadriceps tendon rupture, left, initial encounter 01/22/2018  . Essential hypertension 12/20/2017   Past Medical History:  Diagnosis Date  . DVT, lower extremity ([place])   . Hypertension     Family History  Problem Relation Age of Onset  . Hypertension Mother   . Diabetes Mother     History reviewed. No pertinent surgical history. Social History   Occupational History  . Not on  file  Tobacco Use  . Smoking status: Current Every Day Smoker    Packs/day: 0.50  . Smokeless tobacco: Never Used  Substance and Sexual Activity  . Alcohol use: No    Frequency: Never  . Drug use: No  . Sexual activity: No

## 2018-01-24 ENCOUNTER — Ambulatory Visit (INDEPENDENT_AMBULATORY_CARE_PROVIDER_SITE_OTHER): Payer: Managed Care, Other (non HMO) | Admitting: Family Medicine

## 2018-01-24 ENCOUNTER — Encounter: Payer: Self-pay | Admitting: Family Medicine

## 2018-01-24 VITALS — BP 134/72 | HR 69 | Temp 98.2°F | Resp 16 | Ht 71.0 in | Wt 221.0 lb

## 2018-01-24 DIAGNOSIS — I10 Essential (primary) hypertension: Secondary | ICD-10-CM | POA: Diagnosis not present

## 2018-01-24 DIAGNOSIS — Z86718 Personal history of other venous thrombosis and embolism: Secondary | ICD-10-CM

## 2018-01-24 DIAGNOSIS — Z7901 Long term (current) use of anticoagulants: Secondary | ICD-10-CM | POA: Diagnosis not present

## 2018-01-24 DIAGNOSIS — F17200 Nicotine dependence, unspecified, uncomplicated: Secondary | ICD-10-CM | POA: Diagnosis not present

## 2018-01-24 NOTE — Patient Instructions (Addendum)
Will continue Amlodipine 10 mg daily  Continue medication, monitor blood pressure at home. Continue DASH diet. Reminder to go to the ER if any CP, SOB, nausea, dizziness, severe HA, changes vision/speech, left arm numbness and tingling and jaw pain.   DASH Eating Plan DASH stands for "Dietary Approaches to Stop Hypertension." The DASH eating plan is a healthy eating plan that has been shown to reduce high blood pressure (hypertension). It may also reduce your risk for type 2 diabetes, heart disease, and stroke. The DASH eating plan may also help with weight loss. What are tips for following this plan? General guidelines  Avoid eating more than 2,300 mg (milligrams) of salt (sodium) a day. If you have hypertension, you may need to reduce your sodium intake to 1,500 mg a day.  Limit alcohol intake to no more than 1 drink a day for nonpregnant women and 2 drinks a day for men. One drink equals 12 oz of beer, 5 oz of wine, or 1 oz of hard liquor.  Work with your health care provider to maintain a healthy body weight or to lose weight. Ask what an ideal weight is for you.  Get at least 30 minutes of exercise that causes your heart to beat faster (aerobic exercise) most days of the week. Activities may include walking, swimming, or biking.  Work with your health care provider or diet and nutrition specialist (dietitian) to adjust your eating plan to your individual calorie needs. Reading food labels  Check food labels for the amount of sodium per serving. Choose foods with less than 5 percent of the Daily Value of sodium. Generally, foods with less than 300 mg of sodium per serving fit into this eating plan.  To find whole grains, look for the word "whole" as the first word in the ingredient list. Shopping  Buy products labeled as "low-sodium" or "no salt added."  Buy fresh foods. Avoid canned foods and premade or frozen meals. Cooking  Avoid adding salt when cooking. Use salt-free  seasonings or herbs instead of table salt or sea salt. Check with your health care provider or pharmacist before using salt substitutes.  Do not fry foods. Cook foods using healthy methods such as baking, boiling, grilling, and broiling instead.  Cook with heart-healthy oils, such as olive, canola, soybean, or sunflower oil. Meal planning   Eat a balanced diet that includes: ? 5 or more servings of fruits and vegetables each day. At each meal, try to fill half of your plate with fruits and vegetables. ? Up to 6-8 servings of whole grains each day. ? Less than 6 oz of lean meat, poultry, or fish each day. A 3-oz serving of meat is about the same size as a deck of cards. One egg equals 1 oz. ? 2 servings of low-fat dairy each day. ? A serving of nuts, seeds, or beans 5 times each week. ? Heart-healthy fats. Healthy fats called Omega-3 fatty acids are found in foods such as flaxseeds and coldwater fish, like sardines, salmon, and mackerel.  Limit how much you eat of the following: ? Canned or prepackaged foods. ? Food that is high in trans fat, such as fried foods. ? Food that is high in saturated fat, such as fatty meat. ? Sweets, desserts, sugary drinks, and other foods with added sugar. ? Full-fat dairy products.  Do not salt foods before eating.  Try to eat at least 2 vegetarian meals each week.  Eat more home-cooked food and less restaurant,  buffet, and fast food.  When eating at a restaurant, ask that your food be prepared with less salt or no salt, if possible. What foods are recommended? The items listed may not be a complete list. Talk with your dietitian about what dietary choices are best for you. Grains Whole-grain or whole-wheat bread. Whole-grain or whole-wheat pasta. Brown rice. [place]. Bulgur. Whole-grain and low-sodium cereals. Pita bread. Low-fat, low-sodium crackers. Whole-wheat flour tortillas. Vegetables Fresh or frozen vegetables (raw, steamed, roasted,  or grilled). Low-sodium or reduced-sodium tomato and vegetable juice. Low-sodium or reduced-sodium tomato sauce and tomato paste. Low-sodium or reduced-sodium canned vegetables. Fruits All fresh, dried, or frozen fruit. Canned fruit in natural juice (without added sugar). Meat and other protein foods Skinless chicken or [place]. Ground chicken or [place]. Pork with fat trimmed off. Fish and seafood. Egg whites. Dried beans, peas, or lentils. Unsalted nuts, nut butters, and seeds. Unsalted canned beans. Lean cuts of beef with fat trimmed off. Low-sodium, lean deli meat. Dairy Low-fat (1%) or fat-free (skim) milk. Fat-free, low-fat, or reduced-fat cheeses. Nonfat, low-sodium ricotta or cottage cheese. Low-fat or nonfat yogurt. Low-fat, low-sodium cheese. Fats and oils Soft margarine without trans fats. Vegetable oil. Low-fat, reduced-fat, or light mayonnaise and salad dressings (reduced-sodium). Canola, safflower, olive, soybean, and sunflower oils. Avocado. Seasoning and other foods Herbs. Spices. Seasoning mixes without salt. Unsalted popcorn and pretzels. Fat-free sweets. What foods are not recommended? The items listed may not be a complete list. Talk with your dietitian about what dietary choices are best for you. Grains Baked goods made with fat, such as croissants, muffins, or some breads. Dry pasta or rice meal packs. Vegetables Creamed or fried vegetables. Vegetables in a cheese sauce. Regular canned vegetables (not low-sodium or reduced-sodium). Regular canned tomato sauce and paste (not low-sodium or reduced-sodium). Regular tomato and vegetable juice (not low-sodium or reduced-sodium). Angie Fava. Olives. Fruits Canned fruit in a light or heavy syrup. Fried fruit. Fruit in cream or butter sauce. Meat and other protein foods Fatty cuts of meat. Ribs. Fried meat. Berniece Salines. Sausage. Bologna and other processed lunch meats. Salami. Fatback. Hotdogs. Bratwurst. Salted nuts and seeds. Canned beans  with added salt. Canned or smoked fish. Whole eggs or egg yolks. Chicken or [place] with skin. Dairy Whole or 2% milk, cream, and half-and-half. Whole or full-fat cream cheese. Whole-fat or sweetened yogurt. Full-fat cheese. Nondairy creamers. Whipped toppings. Processed cheese and cheese spreads. Fats and oils Butter. Stick margarine. Lard. Shortening. Ghee. Bacon fat. Tropical oils, such as coconut, palm kernel, or palm oil. Seasoning and other foods Salted popcorn and pretzels. Onion salt, garlic salt, seasoned salt, table salt, and sea salt. Worcestershire sauce. Tartar sauce. Barbecue sauce. Teriyaki sauce. Soy sauce, including reduced-sodium. Steak sauce. Canned and packaged gravies. Fish sauce. Oyster sauce. Cocktail sauce. Horseradish that you find on the shelf. Ketchup. Mustard. Meat flavorings and tenderizers. Bouillon cubes. Hot sauce and Tabasco sauce. Premade or packaged marinades. Premade or packaged taco seasonings. Relishes. Regular salad dressings. Where to find more information:  National Heart, Lung, and [place]: https://wilson-eaton.com/  American Heart Association: www.heart.org Summary  The DASH eating plan is a healthy eating plan that has been shown to reduce high blood pressure (hypertension). It may also reduce your risk for type 2 diabetes, heart disease, and stroke.  With the DASH eating plan, you should limit salt (sodium) intake to 2,300 mg a day. If you have hypertension, you may need to reduce your sodium intake to 1,500 mg a day.  When  on the DASH eating plan, aim to eat more fresh fruits and vegetables, whole grains, lean proteins, low-fat dairy, and heart-healthy fats.  Work with your health care provider or diet and nutrition specialist (dietitian) to adjust your eating plan to your individual calorie needs. This information is not intended to replace advice given to you by your health care provider. Make sure you discuss any questions you have with your health  care provider. Document Released: 11/10/2011 Document Revised: 11/14/2016 Document Reviewed: 11/14/2016 Elsevier Interactive Patient Education  2018 [place] Risks of Smoking Smoking cigarettes is very bad for your health. Tobacco smoke has over 200 known poisons in it. It contains the poisonous gases nitrogen oxide and carbon monoxide. There are over 60 chemicals in tobacco smoke that cause cancer. Smoking is difficult to quit because a chemical in tobacco, called nicotine, causes addiction or dependence. When you smoke and inhale, nicotine is absorbed rapidly into the bloodstream through your lungs. Both inhaled and non-inhaled nicotine may be addictive. What are the risks of cigarette smoke? Cigarette smokers have an increased risk of many serious medical problems, including:  Lung cancer.  Lung disease, such as pneumonia, bronchitis, and emphysema.  Chest pain (angina) and heart attack because the heart is not getting enough oxygen.  Heart disease and peripheral blood vessel disease.  High blood pressure (hypertension).  Stroke.  Oral cancer, including cancer of the lip, mouth, or voice box.  Bladder cancer.  Pancreatic cancer.  Cervical cancer.  Pregnancy complications, including premature birth.  Stillbirths and smaller newborn babies, birth defects, and genetic damage to sperm.  Early menopause.  Lower estrogen level for women.  Infertility.  Facial wrinkles.  Blindness.  Increased risk of broken bones (fractures).  Senile dementia.  Stomach ulcers and internal bleeding.  Delayed wound healing and increased risk of complications during surgery.  Even smoking lightly shortens your life expectancy by several years.  Because of secondhand smoke exposure, children of smokers have an increased risk of the following:  Sudden infant death syndrome (SIDS).  Respiratory infections.  Lung cancer.  Heart disease.  Ear infections.  What  are the benefits of quitting? There are many health benefits of quitting smoking. Here are some of them:  Within days of quitting smoking, your risk of having a heart attack decreases, your blood flow improves, and your lung capacity improves. Blood pressure, pulse rate, and breathing patterns start returning to normal soon after quitting.  Within months, your lungs may clear up completely.  Quitting for 10 years reduces your risk of developing lung cancer and heart disease to almost that of a nonsmoker.  People who quit may see an improvement in their overall quality of life.  How do I quit smoking? Smoking is an addiction with both physical and psychological effects, and longtime habits can be hard to change. Your health care provider can recommend:  Programs and community resources, which may include group support, education, or talk therapy.  Prescription medicines to help reduce cravings.  Nicotine replacement products, such as patches, gum, and nasal sprays. Use these products only as directed. Do not replace cigarette smoking with electronic cigarettes, which are commonly called e-cigarettes. The safety of e-cigarettes is not known, and some may contain harmful chemicals.  A combination of two or more of these methods.  Where to find more information:  American Lung Association: www.lung.org  American Cancer Society: www.cancer.org Summary  Smoking cigarettes is very bad for your health. Cigarette smokers have  an increased risk of many serious medical problems, including several cancers, heart disease, and stroke.  Smoking is an addiction with both physical and psychological effects, and longtime habits can be hard to change.  By stopping right away, you can greatly reduce the risk of medical problems for you and your family.  To help you quit smoking, your health care provider can recommend programs, community resources, prescription medicines, and nicotine replacement  products such as patches, gum, and nasal sprays. This information is not intended to replace advice given to you by your health care provider. Make sure you discuss any questions you have with your health care provider. Document Released: 12/29/2004 Document Revised: 11/25/2016 Document Reviewed: 11/25/2016 Elsevier Interactive Patient Education  2017 [employer].

## 2018-01-24 NOTE — Progress Notes (Signed)
Subjective:    Patient ID: Dustin Jones, male    DOB: 06-Mar-1961, 57 y.o.   MRN: [id number]  HPI Dustin Jones, a 57 year old male with a history of DVT and hypertension presents for a 1 month follow up of hypertension. Patient has been taking all prescribed medications consistently. He sustained a quadriceps tendon rupture 1 week ago and is currently followed by orthopedic services. He says that pain is controlled on current regimen.   Patient has a history of DVT. He is on chronic anticoagulation therapy. He denies any signs of bleeding.   Patient has a history of hypertension. He has been taking amlodipine 10 mg consistnelty over the past several months. He currently denies dizziness, chest pains, heart palpitations, syncope, nausea, vomiting, or dysuria.  Patient endorses swelling to right lower extremity.  Patient is a chronic everyday tobacco user.  He states that he has been smoking since he was 57 years old.  He states that he smokes 1/2 pack of cigarettes per day. Past Medical History:  Diagnosis Date  . DVT, lower extremity ([place])   . Hypertension    Social History   Socioeconomic History  . Marital status: Single    Spouse name: Not on file  . Number of children: Not on file  . Years of education: Not on file  . Highest education level: Not on file  Social Needs  . Financial resource strain: Not on file  . Food insecurity - worry: Not on file  . Food insecurity - inability: Not on file  . Transportation needs - medical: Not on file  . Transportation needs - non-medical: Not on file  Occupational History  . Not on file  Tobacco Use  . Smoking status: Current Every Day Smoker    Packs/day: 0.50  . Smokeless tobacco: Never Used  Substance and Sexual Activity  . Alcohol use: No    Frequency: Never  . Drug use: No  . Sexual activity: No  Other Topics Concern  . Not on file  Social History Narrative  . Not on file   Immunization History  Administered Date(s)  Administered  . Pneumococcal Polysaccharide-23 12/20/2017  . Tdap 12/20/2017   Review of Systems  Constitutional: Negative.  Negative for fatigue, fever and unexpected weight change.  Eyes: Negative for photophobia, redness and visual disturbance.  Respiratory: Negative.   Cardiovascular: Negative.   Gastrointestinal: Negative.   Endocrine: Negative for polydipsia, polyphagia and polyuria.  Genitourinary: Negative.   Musculoskeletal: Negative.   Allergic/Immunologic: Negative.   Neurological: Negative.   Hematological: Negative.   Psychiatric/Behavioral: Negative.        Objective:   Physical Exam BP 134/72 (BP Location: Right Arm, Patient Position: Sitting, Cuff Size: Large) Comment: manually  Pulse 69   Temp 98.2 F (36.8 C) (Oral)   Resp 16   Ht 5\' 11"  (1.803 m)   Wt 221 lb (100.2 kg)   SpO2 100%   BMI 30.82 kg/m   General Appearance:    Alert, cooperative, no distress, appears stated age  Head:    Normocephalic, without obvious abnormality, atraumatic  Eyes:    PERRL, conjunctiva/corneas clear, EOM's intact, fundi    benign, both eyes       Ears:    Normal TM's and external ear canals, both ears  Nose:   Nares normal, septum midline, mucosa normal, no drainage   or sinus tenderness  Throat:   Lips, mucosa, and tongue normal; teeth and gums normal  Neck:  Supple, symmetrical, trachea midline, no adenopathy;       thyroid:  No enlargement/tenderness/nodules; no carotid   bruit or JVD  Back:     Symmetric, no curvature, ROM normal, no CVA tenderness  Lungs:     Clear to auscultation bilaterally, respirations unlabored  Chest wall:    No tenderness or deformity  Heart:    Regular rate and rhythm, S1 and S2 normal, no murmur, rub   or gallop  Abdomen:     Soft, non-tender, bowel sounds active all four quadrants,    no masses, no organomegaly  Extremities:   Extremities normal, atraumatic, no cyanosis.Patient has immobilizer to left leg.   Pulses:   2+ and  symmetric all extremities  Skin:   Skin color, texture, turgor normal, no rashes or lesions  Lymph nodes:   Cervical, supraclavicular, and axillary nodes normal  Neurologic:   CNII-XII intact. Normal strength, sensation and reflexes      throughout       BP 134/72 (BP Location: Right Arm, Patient Position: Sitting, Cuff Size: Large) Comment: manually  Pulse 69   Temp 98.2 F (36.8 C) (Oral)   Resp 16   Ht 5\' 11"  (1.803 m)   Wt 221 lb (100.2 kg)   SpO2 100%   BMI 30.82 kg/m  Assessment & Plan:  1. Essential hypertension Blood pressure is controlled on current medication regimen.  Will continue amlodipine 10 mg daily.  Continue medication, monitor blood pressure at home. Continue DASH diet. Reminder to go to the ER if any CP, SOB, nausea, dizziness, severe HA, changes vision/speech, left arm numbness and tingling and jaw pain. - Basic Metabolic Panel; Future  2. Tobacco dependence Smoking cessation instruction/counseling given:  counseled patient on the dangers of tobacco use, advised patient to stop smoking, and reviewed strategies to maximize success  3. Chronic anticoagulation Continue Xarelto 20 mg daily for 6 months as directed Will review creatinine level as results become available  RTC: 3 months for labs, 6 months for hypertension  [name]  MSN, FNP-C Patient [place] [address] [place], [place] [address] [phone]

## 2018-01-28 DIAGNOSIS — Z7901 Long term (current) use of anticoagulants: Secondary | ICD-10-CM | POA: Insufficient documentation

## 2018-01-28 DIAGNOSIS — Z86718 Personal history of other venous thrombosis and embolism: Secondary | ICD-10-CM | POA: Insufficient documentation

## 2018-02-05 ENCOUNTER — Ambulatory Visit (INDEPENDENT_AMBULATORY_CARE_PROVIDER_SITE_OTHER): Payer: Managed Care, Other (non HMO) | Admitting: Orthopaedic Surgery

## 2018-02-06 MED FILL — XARELTO 20 MG TABLET: 20 | 30 days supply | Qty: 30 | Fill #1

## 2018-02-19 ENCOUNTER — Encounter (INDEPENDENT_AMBULATORY_CARE_PROVIDER_SITE_OTHER): Payer: Self-pay | Admitting: *Deleted

## 2018-03-18 MED FILL — XARELTO 20 MG TABLET: 20 | 30 days supply | Qty: 30 | Fill #2

## 2018-03-19 MED FILL — AMLODIPINE BESYLATE 5 MG TA: 5 | 30 days supply | Qty: 30 | Fill #1

## 2018-04-30 MED FILL — XARELTO 20 MG TABLET: 20 | 30 days supply | Qty: 30 | Fill #3

## 2018-05-01 MED FILL — AMLODIPINE BESYLATE 5 MG TA: 5 | 30 days supply | Qty: 30 | Fill #2

## 2018-05-24 ENCOUNTER — Ambulatory Visit: Payer: Managed Care, Other (non HMO) | Admitting: Family Medicine

## 2018-09-21 MED FILL — XARELTO 20 MG TABLET: 20 | 30 days supply | Qty: 30 | Fill #4

## 2018-09-21 MED FILL — AMLODIPINE BESYLATE 5 MG TA: 5 | 30 days supply | Qty: 30 | Fill #3

## 2019-03-22 ENCOUNTER — Encounter: Payer: Self-pay | Admitting: *Deleted

## 2019-03-22 NOTE — Congregational Nurse Program (Signed)
[place] Screening: Patient states he has hypertension that is being managed by HCTZ. Currently receiving his HCTZ but is not able to receive his Xarelto for his Hx of blood clots.   Referral sent to [facility] for case management assistance with getting his Xarelto.

## 2019-03-25 ENCOUNTER — Telehealth: Payer: Self-pay | Admitting: Pediatric Intensive Care

## 2019-03-25 NOTE — Telephone Encounter (Signed)
Left HIPAA compliant message for client on [facility] .VM room 515. Advised to call back at his convenience. [phone]

## 2019-03-27 IMAGING — DX DG KNEE COMPLETE 4+V*L*
5 series · 5 of 5 positions shown · non-contrast
Comparison: None.

CLINICAL DATA: Left knee pain.

EXAM:
LEFT KNEE - COMPLETE 4+ VIEW

[knee ap]
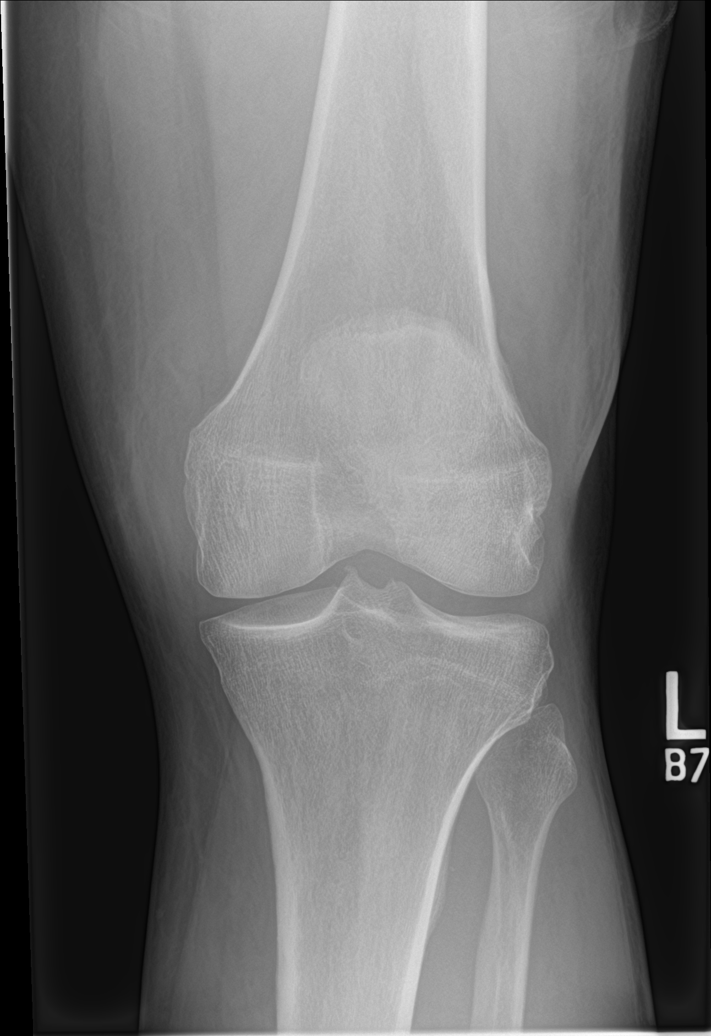

[knee lat]
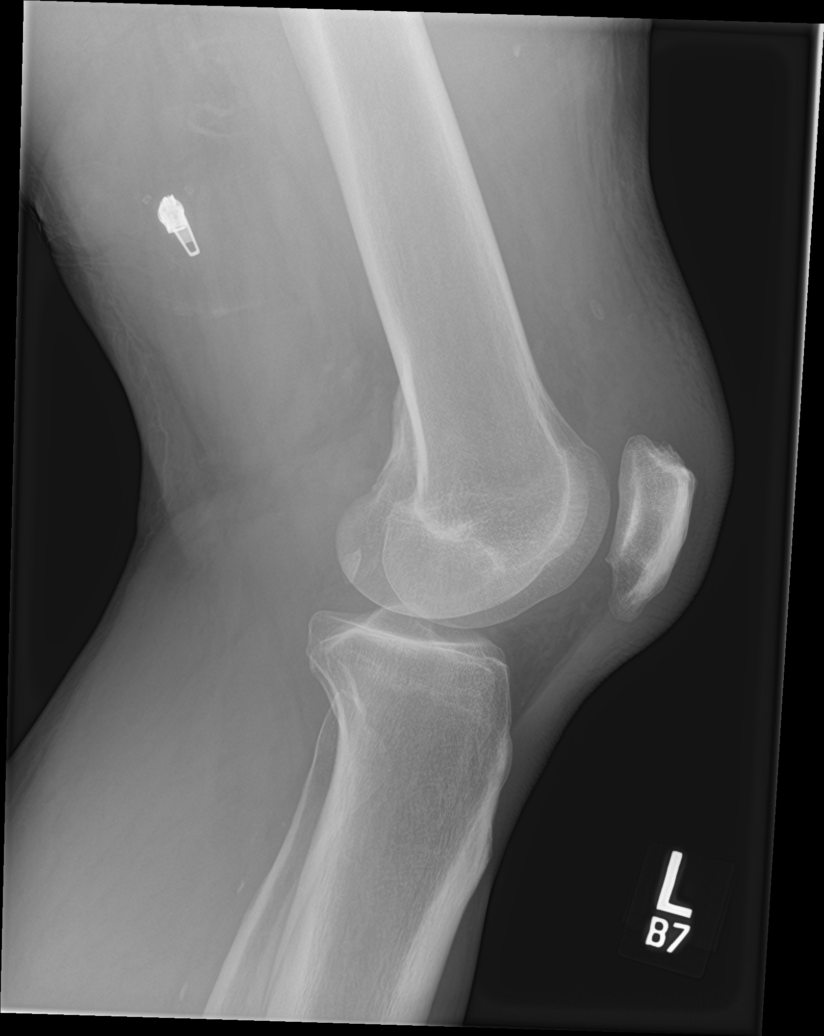

[knee obl (1 of 3)]
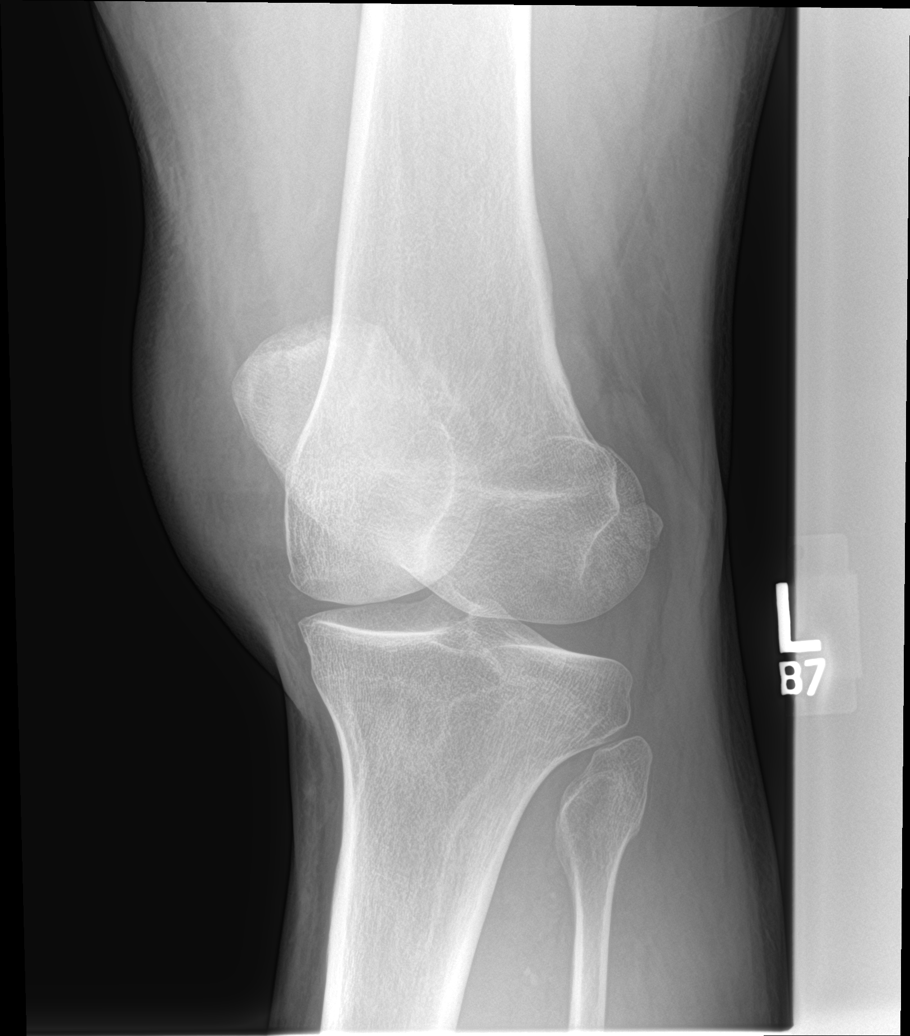

[knee obl (2 of 3)]
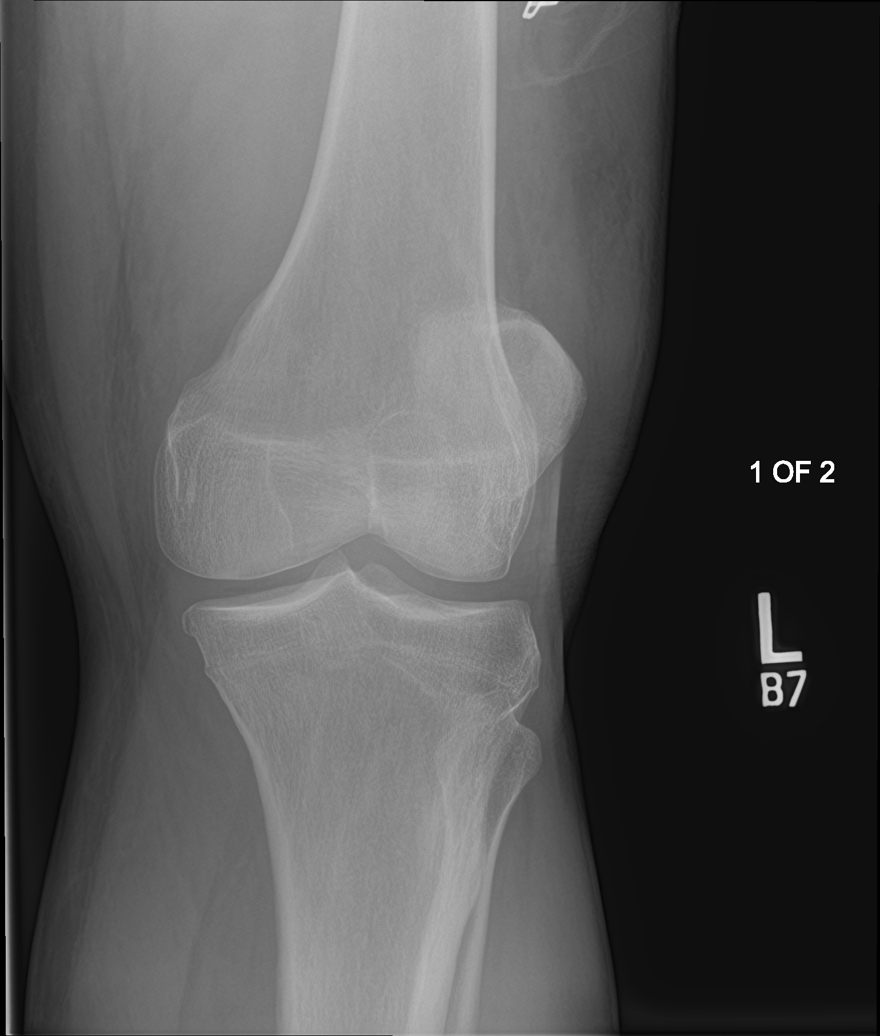

[knee obl (3 of 3)]
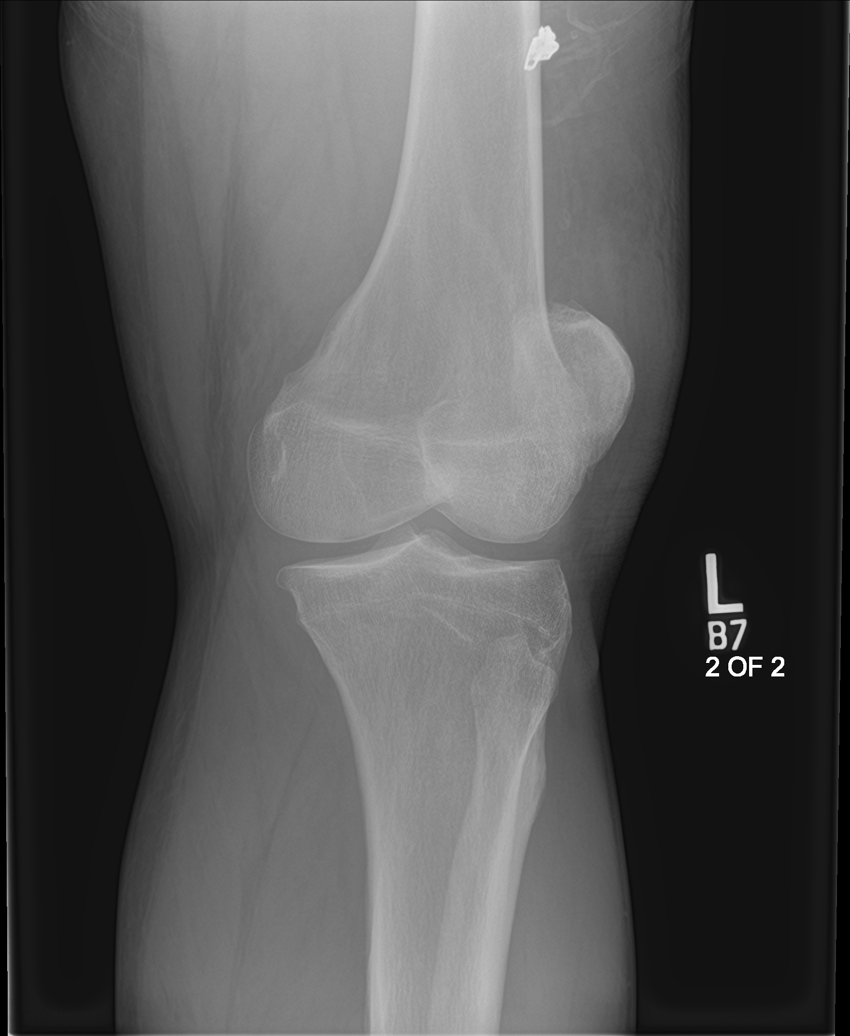

[5 of 5 positions shown; findings below may reference images not displayed]

FINDINGS: There is no fracture or dislocation. There is prominent soft tissue
swelling overlying the patella and distal quadriceps tendon. Are
some dystrophic calcifications in the distal quadriceps tendon.
Joint effusion. Tiny marginal osteophyte on the lower pole of the
patella.
IMPRESSION: Probable rupture of the distal quadriceps tendon.  Joint effusion.

## 2019-03-29 ENCOUNTER — Other Ambulatory Visit: Payer: Self-pay | Admitting: Family Medicine

## 2019-03-29 DIAGNOSIS — Z86718 Personal history of other venous thrombosis and embolism: Secondary | ICD-10-CM

## 2019-03-29 NOTE — Progress Notes (Signed)
[place] Screening performed. Temperature, PHQ-9, and need for medical care and medications assessed. Patient on Xarelto. Stated he did not have medications, but has pharmacy that was filling. Had patient call the pharmacy and they will reach out to provider for refills.Arnold Long RN MSN

## 2019-04-01 ENCOUNTER — Encounter: Payer: Self-pay | Admitting: Pediatric Intensive Care

## 2019-04-01 NOTE — Congregational Nurse Program (Signed)
  Dept: [place] Nurse Program Note  Date of Encounter: 04/01/2019  Past Medical History: Past Medical History:  Diagnosis Date  . DVT, lower extremity ([place])   . Hypertension     Encounter Details: Client referral from [facility] hotel team. See at [facility]. Cleint states that he has a history of DVT and is supposed to be taking Xarelto. He has not had this since February. He also takes HCTZ. He has been a patient at [facility]. CN offers PCP referral. Client would like first available. CN advised she would contact him via hotel phone to let him know when his PCP appointment is. Client agrees to this. Lisette Abu RN BSN CNP [phone]

## 2019-04-08 NOTE — Progress Notes (Signed)
[place] Screening performed. Temperature, PHQ-9, and need for medical care and medications assessed. Patient states that he has been off Xarelto for over a month. Has appointment on May 17th at [facility]. Referral sent to [facility].  Arnold Long RN MSN

## 2019-04-08 NOTE — Congregational Nurse Program (Signed)
Closing encounter per request. 

## 2019-04-12 NOTE — Progress Notes (Signed)
[place] Screening performed. Temperature, PHQ-9, and need for medical care and medications assessed. Patient reports that he has not received his medication from [facility]. Referral to [place] to check on status of medications.  Arnold Long RN MSN

## 2019-04-21 NOTE — Progress Notes (Signed)
[facility] Screening performed. Temperature, PHQ-9, and need for medical care and medications assessed. No additional needs assessed at this time.  Amber Williard RN MSN 

## 2019-04-26 NOTE — Progress Notes (Signed)
C[facility] Hotel Screening performed. Temperature, PHQ-9, and need for medical care and medications assessed. No additional needs assessed at this time.  Jermine Bibbee RN MSN 

## 2019-04-30 ENCOUNTER — Telehealth: Payer: Self-pay | Admitting: *Deleted

## 2019-04-30 DIAGNOSIS — Z20822 Contact with and (suspected) exposure to covid-19: Secondary | ICD-10-CM

## 2019-04-30 NOTE — Telephone Encounter (Signed)
Order placed for COVID-19 testing.  

## 2019-05-19 NOTE — Progress Notes (Signed)
COVID Hotel Screening performed. Temperature, PHQ-9, and need for medical care and medications assessed. No additional needs assessed at this time.  Dustin Kuntzman  MSN, RN 

## 2019-07-09 NOTE — Progress Notes (Signed)
COVID-19 Screening performed. Temperature, PHQ-9, and medication assessment. Patient reports being on several medications that are about to run out and he does not have access to obtain additional medications. Refused referrals.   Jobe Igo MSN, RN

## 2019-08-02 NOTE — Progress Notes (Signed)
COVID-19 Screening performed. Temperature, PHQ-9, and need for medical care and medications assessed. No additional needs assessed at this time.  Hajra Port MSN, RN 

## 2019-08-02 NOTE — Progress Notes (Signed)
COVID-19 Screening performed. Temperature, PHQ-9, and need for medical care and medications assessed. No additional needs assessed at this time.  Korinna Tat MSN, RN 

## 2019-08-27 NOTE — Progress Notes (Signed)
COVID-19 Screening performed. Temperature, PHQ-9, and need for medical care and medications assessed. No additional needs assessed at this time.  Aruna Nestler MSN, RN 

## 2019-08-28 NOTE — Congregational Nurse Program (Signed)
  Dept: [place] Nurse Program Note  Date of Encounter: 08/28/2019  Past Medical History: Past Medical History:  Diagnosis Date  . DVT, lower extremity ([place])   . Hypertension     Encounter Details: CNP Questionnaire - 08/28/19 1814      Questionnaire   Patient Status  Not Applicable    Race  Black or African American    Location Patient Served At  Not Applicable    Insurance  Not Applicable    Uninsured  Uninsured (NEW 1x/quarter)    Food  No food insecurities    Housing/Utilities  No permanent housing    Transportation  Within past 12 months, lack of transportation negatively impacted life    Medication  No medication insecurities    Medical Provider  Yes    Referrals  Primary Care Provider/Clinic;Area Agency    ED Visit Averted  Not Applicable    Life-Saving Intervention Made  Not Applicable     Initial visit today . Has been at the shelter for over a month ,previously was shelter in place at the hotel . Doing okay states his blood pressure is usually high. Smoker ,usually  1 pack every other day . Counseled regarding smoking and high blood pressure and the effects on the heart. He states he understands and knows he needs to work on it. Counseled regarding decreasing it little by little. Has gained weight since April little exercise ,counseled regarding a 30 minute walk 3-4 times a week if possible. He is seen at [facility] by NP and takes his medications. Next visit is [id number].Woking with his brother part time right now and housing is almost there as he is waiting on the final inspection. Will monitor blood pressure weekly as it is really high,he had just smoked when his blood pressure was taken today ,offered smoking classes states he hates classes .

## 2019-09-03 NOTE — [facility] Nurse Program (Signed)
  Dept: [facility] Nurse Program Note  Date of Encounter: 09/03/2019  Past Medical History: Past Medical History:  Diagnosis Date  . 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 Hypertension     Encounter Details: CNP Questionnaire - 09/03/19 1811      Questionnaire   Patient Status  Not Applicable    Race  Black or African [employer]  Not Applicable    Uninsured  Uninsured (Subsequent visits/quarter)    Food  No food insecurities    Housing/Utilities  No permanent housing    Transportation  No transportation needs    Interpersonal Safety  Yes, feel physically and emotionally safe where you currently live    Medication  Yes, have medication insecurities    Medical Provider  Yes    Referrals  Primary Care Provider/Clinic    ED Visit Averted  Not Applicable    Life-Saving Intervention Made  Not Applicable     worked today but in to get his blood pressure checked . Today its better than last week but still high ,discussed his food choices and exercise . States he is working on both .Will see NP at [facility] next Monday and show her his blood pressure reading. Nurse informed him thatnshe sent a note to NP regarding his reading and that she will review his medications . He will e in need of refills also Apt had inspection ,lights to be turned on soon and he will get to move out. Very happy for him wished him success . Will monitor until he moves out.

## 2019-09-11 ENCOUNTER — Telehealth: Payer: Self-pay

## 2019-09-11 NOTE — Telephone Encounter (Signed)
NP responded to blood pressure reading send secure mail last week as a follow up recheck Increase clients Lisinopril to 40 mg per day. Nurse left messages in clients mail box as he was not in working late. He had an appointment on [phone] to see NP hopefully she informed client and was able to call in refills on medication. Ask client to stop by nurses office this week to verify all changes !

## 2019-09-17 ENCOUNTER — Telehealth: Payer: Self-pay

## 2019-09-17 NOTE — Telephone Encounter (Signed)
Client presented on 09-11-2019 as nurse was leaving the site with  A request to contact his PCP regarding his medication refills .Nurse sent NP a message  Client can pick his medications up on Friday from [place] he states . He is aware of blood pressure  Increase as he was able to keep his appointment with NP on 09-09-2019

## 2021-07-14 ENCOUNTER — Encounter (HOSPITAL_COMMUNITY): Payer: Self-pay | Admitting: *Deleted

## 2021-07-14 ENCOUNTER — Other Ambulatory Visit: Payer: Self-pay

## 2021-07-14 ENCOUNTER — Ambulatory Visit (HOSPITAL_COMMUNITY)
Admission: EM | Admit: 2021-07-14 | Discharge: 2021-07-14 | Disposition: A | Discharge status: Home / Self Care | Payer: Managed Care, Other (non HMO)

## 2021-07-14 DIAGNOSIS — R7303 Prediabetes: Secondary | ICD-10-CM | POA: Insufficient documentation

## 2021-07-14 DIAGNOSIS — I10 Essential (primary) hypertension: Secondary | ICD-10-CM | POA: Insufficient documentation

## 2021-07-14 DIAGNOSIS — H66002 Acute suppurative otitis media without spontaneous rupture of ear drum, left ear: Secondary | ICD-10-CM | POA: Insufficient documentation

## 2021-07-14 DIAGNOSIS — J208 Acute bronchitis due to other specified organisms: Secondary | ICD-10-CM | POA: Insufficient documentation

## 2021-07-14 DIAGNOSIS — Z1152 Encounter for screening for COVID-19: Secondary | ICD-10-CM | POA: Insufficient documentation

## 2021-07-14 MED ORDER — ALBUTEROL SULFATE HFA 108 (90 BASE) MCG/ACT IN AERS: 1.0000 | INHALATION_SPRAY | Freq: Four times a day (QID) | RESPIRATORY_TRACT | 0 refills | Status: AC | PRN

## 2021-07-14 MED ORDER — AMOXICILLIN 875 MG PO TABS: 875.0000 mg | ORAL_TABLET | Freq: Two times a day (BID) | ORAL | 0 refills | Status: AC

## 2021-07-14 MED ORDER — PREDNISONE 10 MG (21) PO TBPK: ORAL_TABLET | Freq: Every day | ORAL | 0 refills | Status: AC

## 2021-07-14 NOTE — Discharge Instructions (Addendum)
-  Prednisone taper for cough/bronchitis. I recommend taking this in the morning as it could give you energy.  Avoid NSAIDs like ibuprofen and alleve while taking this medication as they can increase your risk of stomach upset and even GI bleeding when in combination with a steroid. You can continue tylenol (acetaminophen) up to '1000mg'$  3x daily. -Start the antibiotic-Amoxicillin, 1 pill every 12 hours for 7 days.  You can take this with food like with breakfast and dinner. -Albuterol inhaler as needed for cough, wheezing, shortness of breath -Please check your blood pressure at home or at the pharmacy. If this continues to be >140/90, follow-up with your primary care provider for further blood pressure management/ medication titration. If you develop chest pain, shortness of breath, vision changes, the worst headache of your life- head straight to the ED or call 911.

## 2021-07-14 NOTE — ED Triage Notes (Signed)
Reports starting with "head cold" approx 1 wk ago; now c/o some chest congestion and cough along with left ear pressure.  No known fevers.

## 2021-07-14 NOTE — ED Provider Notes (Signed)
Dustin Jones    CSN: [id number] Arrival date & time: 07/14/21  1612      History   Chief Complaint Chief Complaint  Patient presents with   Cough    HPI Dustin Jones is a 60 y.o. male presenting with cough x7 days following viral URI. Medical history hypertension, DVT. Denies hx pulm ds. Initially with viral symptoms, these have overall improved but the cough has persisted and now with left ear pain and muffled hearing.  Describes this as worse at night, nonproductive. Wheezing but no shortness of breath. Denies dizziness, tinnitus, chest pain, weakness. Denies fevers/chills, n/v/d, shortness of breath, chest pain,  facial pain, teeth pain, headaches, sore throat, loss of taste/smell, swollen lymph nodes.   HPI  Past Medical History:  Diagnosis Date   DVT, lower extremity ([place])    Hypertension     Patient Active Problem List   Diagnosis Date Noted   Chronic anticoagulation 01/28/2018   History of DVT of lower extremity 01/28/2018   Quadriceps tendon rupture, left, initial encounter 01/22/2018   Essential hypertension 12/20/2017    History reviewed. No pertinent surgical history.     Home Medications    Prior to Admission medications   Medication Sig Start Date End Date Taking? Authorizing Provider  albuterol (VENTOLIN HFA) 108 (90 Base) MCG/ACT inhaler Inhale 1-2 puffs into the lungs every 6 (six) hours as needed for wheezing or shortness of breath. 07/14/21  Yes Hazel Sams, PA-C  amLODipine (NORVASC) 10 MG tablet Take 1 tablet (10 mg total) by mouth daily. 01/15/18  Yes Scot Jun, FNP  amoxicillin (AMOXIL) 875 MG tablet Take 1 tablet (875 mg total) by mouth 2 (two) times daily for 7 days. 07/14/21 07/21/21 Yes Hazel Sams, PA-C  predniSONE (STERAPRED UNI-PAK 21 TAB) 10 MG (21) TBPK tablet Take by mouth daily. Take 6 tabs by mouth daily  for 2 days, then 5 tabs for 2 days, then 4 tabs for 2 days, then 3 tabs for 2 days, 2 tabs for 2 days,  then 1 tab by mouth daily for 2 days 07/14/21  Yes Phillip Heal, Sherlon Handing, PA-C  UNKNOWN TO PATIENT Additional HTN meds (unk names)   Yes [provider]    Family History Family History  Problem Relation Age of Onset   Hypertension Mother    Diabetes Mother     Social History Social History   Tobacco Use   Smoking status: Every Day    Packs/day: 0.50    Types: Cigarettes   Smokeless tobacco: Never  Vaping Use   Vaping Use: Never used  Substance Use Topics   Alcohol use: Yes    Comment: occasionally   Drug use: Not Currently    Comment: > 21 yrs ago     Allergies   Patient has no known allergies.   Review of Systems Review of Systems  Constitutional:  Negative for appetite change, chills and fever.  HENT:  Positive for congestion and ear pain. Negative for rhinorrhea, sinus pressure, sinus pain and sore throat.   Eyes:  Negative for redness and visual disturbance.  Respiratory:  Positive for cough and wheezing. Negative for chest tightness and shortness of breath.   Cardiovascular:  Negative for chest pain and palpitations.  Gastrointestinal:  Negative for abdominal pain, constipation, diarrhea, nausea and vomiting.  Genitourinary:  Negative for dysuria, frequency and urgency.  Musculoskeletal:  Negative for myalgias.  Neurological:  Negative for dizziness, weakness and headaches.  Psychiatric/Behavioral:  Negative for confusion.   All other systems reviewed and are negative.   Physical Exam Triage Vital Signs ED Triage Vitals [07/14/21 1728]  Enc Vitals Group     BP (!) 170/99     Pulse Rate 66     Resp 20     Temp 98.6 F (37 C)     Temp Source Oral     SpO2 95 %     Weight      Height      Head Circumference      Peak Flow      Pain Score 0     Pain Loc      Pain Edu?      Excl. in [place]?    No data found.  Updated Vital Signs BP (!) 170/99   Pulse 66   Temp 98.6 F (37 C) (Oral)   Resp 20   SpO2 95%   Visual Acuity Right Eye Distance:    Left Eye Distance:   Bilateral Distance:    Right Eye Near:   Left Eye Near:    Bilateral Near:     Physical Exam Vitals reviewed.  Constitutional:      General: He is not in acute distress.    Appearance: Normal appearance. He is not ill-appearing.  HENT:     Head: Normocephalic and atraumatic.     Right Ear: Tympanic membrane, ear canal and external ear normal. No tenderness. No middle ear effusion. There is no impacted cerumen. Tympanic membrane is not perforated, erythematous, retracted or bulging.     Left Ear: Ear canal and external ear normal. Tenderness present.  No middle ear effusion. There is no impacted cerumen. Tympanic membrane is erythematous and bulging. Tympanic membrane is not perforated or retracted.     Ears:     Comments: L cervical lymphadenopathy     Nose: Nose normal. No congestion.     Mouth/Throat:     Mouth: Mucous membranes are moist.     Pharynx: Uvula midline. No oropharyngeal exudate or posterior oropharyngeal erythema.  Eyes:     Extraocular Movements: Extraocular movements intact.     Pupils: Pupils are equal, round, and reactive to light.  Cardiovascular:     Rate and Rhythm: Normal rate and regular rhythm.     Heart sounds: Normal heart sounds.  Pulmonary:     Effort: Pulmonary effort is normal.     Breath sounds: Normal breath sounds. No decreased breath sounds, wheezing, rhonchi or rales.  Abdominal:     Palpations: Abdomen is soft.     Tenderness: There is no abdominal tenderness. There is no guarding or rebound.  Neurological:     General: No focal deficit present.     Mental Status: He is alert and oriented to person, place, and time.  Psychiatric:        Mood and Affect: Mood normal.        Behavior: Behavior normal.        Thought Content: Thought content normal.        Judgment: Judgment normal.     UC Treatments / Results  Labs (all labs ordered are listed, but only abnormal results are displayed) Labs Reviewed  SARS  CORONAVIRUS 2 (TAT 6-24 HRS)    EKG   Radiology No results found.  Procedures Procedures (including critical care time)  Medications Ordered in UC Medications - No data to display  Initial Impression / Assessment and Plan / UC Course  I have  reviewed the triage vital signs and the nursing notes.  Pertinent labs & imaging results that were available during my care of the patient were reviewed by me and considered in my medical decision making (see chart for details).     This patient is a very pleasant 60 y.o. year old male presenting with viral bronchitis and L AOM following viral URI. Covid PCR sent. Today this pt is afebrile nontachycardic nontachypneic, oxygenating well on room air, no wheezes rhonchi or rales.  History DVT, no longer on anticoagulation, no current leg pain or calf swelling,nontachycardic. Prediabetic- sugars running 110s per pt. Hypertension- continue current regimen. Prednisone taper, amoxicillin, albuterol inhaler sent. ED return precautions discussed. Patient verbalizes understanding and agreement. He is vaccinated for covid19.  Final Clinical Impressions(s) / UC Diagnoses   Final diagnoses:  Non-recurrent acute suppurative otitis media of left ear without spontaneous rupture of tympanic membrane  Viral bronchitis  Encounter for screening for COVID-19  Prediabetes  Essential hypertension     Discharge Instructions      -Prednisone taper for cough/bronchitis. I recommend taking this in the morning as it could give you energy.  Avoid NSAIDs like ibuprofen and alleve while taking this medication as they can increase your risk of stomach upset and even GI bleeding when in combination with a steroid. You can continue tylenol (acetaminophen) up to '1000mg'$  3x daily. -Start the antibiotic-Amoxicillin, 1 pill every 12 hours for 7 days.  You can take this with food like with breakfast and dinner. -Albuterol inhaler as needed for cough, wheezing, shortness of  breath -Please check your blood pressure at home or at the pharmacy. If this continues to be >140/90, follow-up with your primary care provider for further blood pressure management/ medication titration. If you develop chest pain, shortness of breath, vision changes, the worst headache of your life- head straight to the ED or call 911.      ED Prescriptions     Medication Sig Dispense Auth. Provider   amoxicillin (AMOXIL) 875 MG tablet Take 1 tablet (875 mg total) by mouth 2 (two) times daily for 7 days. 14 tablet Hazel Sams, PA-C   predniSONE (STERAPRED UNI-PAK 21 TAB) 10 MG (21) TBPK tablet Take by mouth daily. Take 6 tabs by mouth daily  for 2 days, then 5 tabs for 2 days, then 4 tabs for 2 days, then 3 tabs for 2 days, 2 tabs for 2 days, then 1 tab by mouth daily for 2 days 42 tablet Hazel Sams, PA-C   albuterol (VENTOLIN HFA) 108 (90 Base) MCG/ACT inhaler Inhale 1-2 puffs into the lungs every 6 (six) hours as needed for wheezing or shortness of breath. 1 each Hazel Sams, PA-C      PDMP not reviewed this encounter.   Hazel Sams, PA-C 07/14/21 1812

## 2021-07-15 LAB — SARS CORONAVIRUS 2 (TAT 6-24 HRS): SARS Coronavirus 2: NEGATIVE

## 2021-08-08 ENCOUNTER — Emergency Department (HOSPITAL_COMMUNITY): Payer: Self-pay

## 2021-08-08 ENCOUNTER — Other Ambulatory Visit: Payer: Self-pay

## 2021-08-08 ENCOUNTER — Emergency Department (HOSPITAL_COMMUNITY)
Admission: EM | Admit: 2021-08-08 | Discharge: 2021-08-08 | Disposition: A | Discharge status: Home / Self Care | Payer: Self-pay | Attending: Emergency Medicine | Admitting: Emergency Medicine

## 2021-08-08 DIAGNOSIS — D72829 Elevated white blood cell count, unspecified: Secondary | ICD-10-CM | POA: Insufficient documentation

## 2021-08-08 DIAGNOSIS — Z79899 Other long term (current) drug therapy: Secondary | ICD-10-CM | POA: Insufficient documentation

## 2021-08-08 DIAGNOSIS — R55 Syncope and collapse: Secondary | ICD-10-CM | POA: Insufficient documentation

## 2021-08-08 DIAGNOSIS — Y9289 Other specified places as the place of occurrence of the external cause: Secondary | ICD-10-CM | POA: Insufficient documentation

## 2021-08-08 DIAGNOSIS — W01198A Fall on same level from slipping, tripping and stumbling with subsequent striking against other object, initial encounter: Secondary | ICD-10-CM | POA: Insufficient documentation

## 2021-08-08 DIAGNOSIS — S01111A Laceration without foreign body of right eyelid and periocular area, initial encounter: Secondary | ICD-10-CM | POA: Insufficient documentation

## 2021-08-08 DIAGNOSIS — I10 Essential (primary) hypertension: Secondary | ICD-10-CM | POA: Insufficient documentation

## 2021-08-08 DIAGNOSIS — J189 Pneumonia, unspecified organism: Secondary | ICD-10-CM | POA: Insufficient documentation

## 2021-08-08 DIAGNOSIS — R0789 Other chest pain: Secondary | ICD-10-CM | POA: Insufficient documentation

## 2021-08-08 DIAGNOSIS — Y990 Civilian activity done for income or pay: Secondary | ICD-10-CM | POA: Insufficient documentation

## 2021-08-08 DIAGNOSIS — H2101 Hyphema, right eye: Secondary | ICD-10-CM | POA: Insufficient documentation

## 2021-08-08 DIAGNOSIS — Z20822 Contact with and (suspected) exposure to covid-19: Secondary | ICD-10-CM | POA: Insufficient documentation

## 2021-08-08 DIAGNOSIS — F17210 Nicotine dependence, cigarettes, uncomplicated: Secondary | ICD-10-CM | POA: Insufficient documentation

## 2021-08-08 DIAGNOSIS — R0602 Shortness of breath: Secondary | ICD-10-CM | POA: Insufficient documentation

## 2021-08-08 LAB — TROPONIN I (HIGH SENSITIVITY)
Troponin I (High Sensitivity): 14 ng/L (ref <18)
Troponin I (High Sensitivity): 18 ng/L — ABNORMAL HIGH (ref <18)

## 2021-08-08 LAB — BASIC METABOLIC PANEL
Anion gap: 7 (ref 5–15)
BUN: 16 mg/dL (ref 6–20)
CO2: 25 mmol/L (ref 22–32)
Calcium: 8.7 mg/dL — ABNORMAL LOW (ref 8.9–10.3)
Chloride: 106 mmol/L (ref 98–111)
Creatinine, Ser: 1.01 mg/dL (ref 0.61–1.24)
GFR, Estimated: >60 mL/min (ref >60)
Glucose, Bld: 116 mg/dL — ABNORMAL HIGH (ref 70–99)
Potassium: 3.9 mmol/L (ref 3.5–5.1)
Sodium: 138 mmol/L (ref 135–145)

## 2021-08-08 LAB — CBC WITH DIFFERENTIAL/PLATELET
Abs Immature Granulocytes: 0 10*3/uL (ref 0.00–0.07)
Basophils Absolute: 0 10*3/uL (ref 0.0–0.1)
Basophils Relative: 0 %
Eosinophils Absolute: 0.4 10*3/uL (ref 0.0–0.5)
Eosinophils Relative: 1 %
HCT: 45.6 % (ref 39.0–52.0)
Hemoglobin: 14.8 g/dL (ref 13.0–17.0)
Lymphocytes Relative: 89 %
Lymphs Abs: 40 10*3/uL — ABNORMAL HIGH (ref 0.7–4.0)
MCH: 30 pg (ref 26.0–34.0)
MCHC: 32.5 g/dL (ref 30.0–36.0)
MCV: 92.5 fL (ref 80.0–100.0)
Monocytes Absolute: 1.3 10*3/uL — ABNORMAL HIGH (ref 0.1–1.0)
Monocytes Relative: 3 %
Neutro Abs: 3.1 10*3/uL (ref 1.7–7.7)
Neutrophils Relative %: 7 %
Platelets: 208 10*3/uL (ref 150–400)
RBC: 4.93 MIL/uL (ref 4.22–5.81)
RDW: 14.6 % (ref 11.5–15.5)
WBC: 44.9 10*3/uL — ABNORMAL HIGH (ref 4.0–10.5)
nRBC: 0 /100 WBC
nRBC: 0.2 % (ref 0.0–0.2)

## 2021-08-08 LAB — URINALYSIS, ROUTINE W REFLEX MICROSCOPIC
Bacteria, UA: NONE SEEN
Bilirubin Urine: NEGATIVE
Glucose, UA: NEGATIVE mg/dL
Ketones, ur: NEGATIVE mg/dL
Leukocytes,Ua: NEGATIVE
Nitrite: NEGATIVE
Protein, ur: NEGATIVE mg/dL
Specific Gravity, Urine: 1.012 (ref 1.005–1.030)
pH: 6 (ref 5.0–8.0)

## 2021-08-08 LAB — RESP PANEL BY RT-PCR (FLU A&B, COVID) ARPGX2
Influenza A by PCR: NEGATIVE
Influenza B by PCR: NEGATIVE
SARS Coronavirus 2 by RT PCR: NEGATIVE

## 2021-08-08 LAB — MAGNESIUM: Magnesium: 2.3 mg/dL (ref 1.7–2.4)

## 2021-08-08 LAB — BRAIN NATRIURETIC PEPTIDE: B Natriuretic Peptide: 35.3 pg/mL (ref 0.0–100.0)

## 2021-08-08 MED ORDER — DOXYCYCLINE HYCLATE 100 MG PO CAPS: 100.0000 mg | ORAL_CAPSULE | Freq: Two times a day (BID) | ORAL | 0 refills | Status: AC

## 2021-08-08 MED ORDER — ACETAMINOPHEN 325 MG PO TABS: 650.0000 mg | ORAL_TABLET | Freq: Four times a day (QID) | ORAL | 0 refills | Status: AC | PRN

## 2021-08-08 MED ORDER — DOXYCYCLINE HYCLATE 100 MG PO TABS
100.0000 mg | ORAL_TABLET | Freq: Once | ORAL | Status: AC
  Administered 2021-08-08: 100 mg via ORAL
  Filled 2021-08-08: qty 1

## 2021-08-08 MED ORDER — LIDOCAINE-EPINEPHRINE-TETRACAINE (LET) TOPICAL GEL
3.0000 mL | Freq: Once | TOPICAL | Status: AC
  Administered 2021-08-08: 3 mL via TOPICAL
  Filled 2021-08-08: qty 3

## 2021-08-08 NOTE — ED Triage Notes (Signed)
Pt BIB EMS from work Dustin Jones) for syncopal episode. Pt fell and struck head. +LOC. No blood thinner use. Lac above right eye with swelling. Denies CP/SHOB, nausea, or neck/back pain. BGL 120.   BP 150/112 HR52 RR 18 SpO2 96% RA

## 2021-08-08 NOTE — ED Notes (Signed)
Pt verbalizes understanding of discharge instructions. Opportunity for questions and answers were provided. Pt discharged from the ED.   ?

## 2021-08-08 NOTE — ED Provider Notes (Signed)
[facility] Provider Note   CSN: [id number] Arrival date & time: 08/08/21  0759     History Chief Complaint  Patient presents with   Loss of Consciousness    Dustin Jones is a 60 y.o. male.  Pt is a 60 yo male presenting via EMS for fall. Pt states he was a work when he had loc. Admits to head trauma with laceration to right eyebrow. Denies blood thinner use. LOC was witnessed and lasting seconds only. Denies chest pain or sob. Denies presyncopal symptoms.   The history is provided by the patient. No language interpreter was used.  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     Family History  Problem Relation Age of Onset   Hypertension Mother    Diabetes Mother     Social History   Tobacco Use   Smoking status: Every Day    Packs/day: 0.50    Types: Cigarettes   Smokeless tobacco: Never  Vaping Use   Vaping Use: Never used  Substance Use Topics   Alcohol use: Yes    Comment: occasionally   Drug use: Not Currently    Comment: > 21 yrs ago    Home Medications Prior to Admission medications   Medication Sig Start Date End Date Taking? Authorizing Provider  acetaminophen (TYLENOL) 325 MG tablet Take 2 tablets (650 mg total) by mouth every 6 (six) hours as needed. [id number]  Yes Campbell Stall P, DO  doxycycline (VIBRAMYCIN) 100 MG capsule Take 1 capsule (100 mg  total) by mouth 2 (two) times daily for 10 days. 08/08/21 0000000 Yes Campbell Stall P, DO  albuterol (VENTOLIN HFA) 108 (90 Base) MCG/ACT inhaler Inhale 1-2 puffs into the lungs every 6 (six) hours as needed for wheezing or shortness of breath. 07/14/21   Hazel Sams, PA-C  amLODipine (NORVASC) 10 MG tablet Take 1 tablet (10 mg total) by mouth daily. 01/15/18   Scot Jun, FNP  cloNIDine (CATAPRES) 0.3 MG tablet Take 0.3 mg by mouth at bedtime. 06/17/21   [provider]  FLOMAX 0.4 MG CAPS capsule Take 0.4 mg by mouth at bedtime. 06/17/21   [provider]  hydrALAZINE (APRESOLINE) 100 MG tablet Take 100 mg by mouth 2 (two) times daily. 06/17/21   [provider]  hydrochlorothiazide (HYDRODIURIL) 25 MG tablet Take 25 mg by mouth daily. 06/17/21   [provider]  lisinopril (ZESTRIL) 40 MG tablet Take 40 mg by mouth daily. 06/17/21   [provider]  predniSONE (STERAPRED UNI-PAK 21 TAB) 10 MG (21) TBPK tablet Take by mouth daily. Take 6 tabs by mouth daily  for 2 days, then 5 tabs for 2 days, then 4 tabs for 2 days, then 3 tabs for 2 days, 2 tabs for 2 days, then 1 tab by mouth daily for 2 days 07/14/21   Phillip Heal,  Sherlon Handing, PA-C  UNKNOWN TO PATIENT Additional HTN meds (unk names)    [provider]    Allergies    Patient has no known allergies.  Review of Systems   Review of Systems  Constitutional:  Negative for chills and fever.  HENT:  Negative for ear pain and sore throat.   Eyes:  Negative for pain and visual disturbance.  Respiratory:  Positive for cough. Negative for shortness of breath.   Cardiovascular:  Positive for syncope. Negative for chest pain and palpitations.  Gastrointestinal:  Negative for abdominal pain and vomiting.  Genitourinary:  Negative for dysuria and hematuria.  Musculoskeletal:  Negative for arthralgias and back pain.  Skin:  Negative for color change and rash.  Neurological:  Positive for syncope and  light-headedness. Negative for seizures.  All other systems reviewed and are negative.  Physical Exam Updated Vital Signs BP (!) 157/106   Pulse 70   Temp 98.6 F (37 C) (Oral)   Resp 11   SpO2 94%   Physical Exam Vitals and nursing note reviewed.  Constitutional:      Appearance: He is well-developed.  HENT:     Head:   Eyes:     General: Vision grossly intact.     Extraocular Movements: Extraocular movements intact.     Conjunctiva/sclera: Conjunctivae normal.     Pupils: Pupils are equal, round, and reactive to light.  Cardiovascular:     Rate and Rhythm: Normal rate and regular rhythm.     Heart sounds: No murmur heard. Pulmonary:     Effort: Pulmonary effort is normal. No respiratory distress.     Breath sounds: Normal breath sounds.  Abdominal:     Palpations: Abdomen is soft.     Tenderness: There is no abdominal tenderness.  Musculoskeletal:     Cervical back: Neck supple. Bony tenderness present.     Thoracic back: No bony tenderness.     Lumbar back: No bony tenderness.  Skin:    General: Skin is warm and dry.  Neurological:     Mental Status: He is alert.     GCS: GCS eye subscore is 4. GCS verbal subscore is 5. GCS motor subscore is 6.     Cranial Nerves: Cranial nerves are intact.     Sensory: Sensation is intact.     Motor: Motor function is intact.     Coordination: Coordination is intact.    ED Results / Procedures / Treatments   Labs (all labs ordered are listed, but only abnormal results are displayed) Labs Reviewed  CBC WITH DIFFERENTIAL/PLATELET - Abnormal; Notable for the following components:      Result Value   WBC 44.9 (*)    Lymphs Abs 40.0 (*)    Monocytes Absolute 1.3 (*)    All other components within normal limits  BASIC METABOLIC PANEL - Abnormal; Notable for the following components:   Glucose, Bld 116 (*)    Calcium 8.7 (*)    All other components within normal limits  URINALYSIS, ROUTINE W REFLEX MICROSCOPIC - Abnormal;  Notable for the following components:   Color, Urine STRAW (*)    Hgb urine dipstick SMALL (*)    All other components within normal limits  TROPONIN I (HIGH SENSITIVITY) - Abnormal; Notable for the following components:   Troponin I (High Sensitivity) 18 (*)    All other components within normal limits  RESP PANEL BY RT-PCR (FLU A&B, COVID) ARPGX2  BRAIN NATRIURETIC PEPTIDE  MAGNESIUM  PATHOLOGIST SMEAR REVIEW  TROPONIN I (HIGH SENSITIVITY)    EKG None  Radiology CT HEAD WO CONTRAST (5MM)  Result Date: 08/08/2021 CLINICAL DATA:  Fall, facial trauma EXAM: CT HEAD WITHOUT CONTRAST CT CERVICAL SPINE WITHOUT CONTRAST TECHNIQUE: Multidetector CT imaging of the head and cervical spine was performed following the standard protocol without intravenous contrast. Multiplanar CT image reconstructions of the cervical spine were also generated. COMPARISON:  None. FINDINGS: CT HEAD FINDINGS Brain: No evidence of acute infarction, hemorrhage, hydrocephalus, extra-axial collection or mass lesion/mass effect. Vascular: Negative for hyperdense vessel Skull: Negative Sinuses/Orbits: Mild mucosal edema paranasal sinuses. Right periorbital soft tissue hematoma and laceration. Other: None CT CERVICAL SPINE FINDINGS Alignment: Prominent cervical kyphosis.  3 mm anterolisthesis C4-5 Skull base and vertebrae: Negative for fracture Soft tissues and spinal canal: Multiple subcentimeter lymph nodes in the neck bilaterally. No pathologic adenopathy. Disc levels: Multilevel disc and facet degeneration throughout the cervical spine. Anterolisthesis at C4-5 likely is degenerative. Foraminal encroachment bilaterally due to spurring. Upper chest: Lung apices clear bilaterally. Other: None IMPRESSION: 1. Negative CT of the head 2. Right periorbital soft tissue hematoma and laceration 3. Negative for cervical fracture. Cervical spondylosis and kyphosis. Electronically Signed   By: Franchot Gallo M.D.   On: 08/08/2021 09:12   CT  Chest Wo Contrast  Result Date: 08/08/2021 CLINICAL DATA:  Abnormal xray - lung nodule, >= 1 cm Abnormal xray for possible adenopathy, presented to ED for syncope, WBC 44 EXAM: CT CHEST WITHOUT CONTRAST TECHNIQUE: Multidetector CT imaging of the chest was performed following the standard protocol without IV contrast. COMPARISON:  None. FINDINGS: Cardiovascular: Normal cardiac size.No pericardial disease.Mild coronary artery calcifications.The thoracic aorta is unremarkable. Mediastinum/Nodes: There is bilateral axillary and left supraclavicular lymphadenopathy. For reference: - Left axillary lymph node measures 1.5 cm short axis (series 2, image 31). - Right axillary lymph node measures 1.7 cm short axis (series 3, image 36). - Left supraclavicular lymph node measures 1.6 cm short axis (series 3, image 8). There are a few prominent mediastinal nodes technically nonenlarged by size criteria. The thyroid is unremarkable.Esophagus is unremarkable.The trachea is unremarkable. Lungs/Pleura: There are peripheral left basilar opacities which could be due to volume loss or infection. Mild hypoventilatory changes.No suspicious pulmonary nodules or masses.No pleural effusion.No pneumothorax. Upper Abdomen: Diffuse hepatic steatosis. There are a few prominent epigastric lymph nodes, not technically enlarged by size criteria. Left adrenal thickening without discrete nodule. Musculoskeletal: Multilevel degenerative changes of the spine. No acute osseous abnormality. No suspicious lytic or blastic lesions. IMPRESSION: Bilateral axillary and left supraclavicular lymphadenopathy. Few prominent mediastinal and epigastric lymph nodes. Peripheral left basilar opacity which could be due to focal volume loss or developing infection. Diffuse hepatic steatosis. Electronically Signed   By: Maurine Simmering M.D.   On: 08/08/2021 10:05   CT Cervical Spine Wo Contrast  Result Date: 08/08/2021 CLINICAL DATA:  Fall, facial trauma EXAM: CT HEAD  WITHOUT CONTRAST CT CERVICAL SPINE WITHOUT CONTRAST TECHNIQUE: Multidetector CT imaging of the head and cervical spine was performed following the standard protocol without intravenous contrast. Multiplanar CT image reconstructions of the cervical spine were also generated. COMPARISON:  None. FINDINGS: CT HEAD FINDINGS Brain: No evidence of acute infarction, hemorrhage, hydrocephalus, extra-axial collection or mass lesion/mass effect. Vascular: Negative for hyperdense vessel Skull: Negative Sinuses/Orbits: Mild mucosal edema paranasal sinuses. Right periorbital soft tissue hematoma and laceration. Other: None CT CERVICAL SPINE FINDINGS Alignment: Prominent cervical kyphosis.  3 mm anterolisthesis C4-5 Skull base and vertebrae: Negative for fracture  Soft tissues and spinal canal: Multiple subcentimeter lymph nodes in the neck bilaterally. No pathologic adenopathy. Disc levels: Multilevel disc and facet degeneration throughout the cervical spine. Anterolisthesis at C4-5 likely is degenerative. Foraminal encroachment bilaterally due to spurring. Upper chest: Lung apices clear bilaterally. Other: None IMPRESSION: 1. Negative CT of the head 2. Right periorbital soft tissue hematoma and laceration 3. Negative for cervical fracture. Cervical spondylosis and kyphosis. Electronically Signed   By: Franchot Gallo M.D.   On: 08/08/2021 09:12   DG Chest Portable 1 View  Result Date: 08/08/2021 CLINICAL DATA:  Syncope EXAM: PORTABLE CHEST 1 VIEW COMPARISON:  None. FINDINGS: Top-normal heart size. Mild fullness in the AP window region. Otherwise normal mediastinal contour. No pneumothorax. No pleural effusion. Lungs appear clear, with no acute consolidative airspace disease and no pulmonary edema. IMPRESSION: 1. Mild fullness in the AP window region, cannot exclude adenopathy. Suggest further evaluation with chest CT with IV contrast. 2. No active pulmonary disease. Electronically Signed   By: Ilona Sorrel M.D.   On: 08/08/2021  08:35    Procedures .Marland KitchenLaceration Repair  Date/Time: 08/08/2021 1:39 PM Performed by: Lianne Cure, DO Authorized by: Lianne Cure, DO   Consent:    Consent obtained:  Verbal   Consent given by:  Patient   Risks, benefits, and alternatives were discussed: yes     Risks discussed:  Infection, pain and poor cosmetic result   Alternatives discussed:  No treatment Universal protocol:    Procedure explained and questions answered to patient or proxy's satisfaction: yes     Patient identity confirmed:  Verbally with patient Anesthesia:    Anesthesia method: LET. Treatment:    Irrigation solution:  Tap water   Visualized foreign bodies/material removed: no     Debridement:  None   Undermining:  None   Scar revision: no   Skin repair:    Repair method:  Sutures   Suture size:  5-0   Suture technique:  Simple interrupted   Number of sutures:  10 Repair type:    Repair type:  Complex Post-procedure details:    Dressing:  Sterile dressing   Procedure completion:  Tolerated well, no immediate complications .Critical Care  Date/Time: 08/08/2021 1:40 PM Performed by: Lianne Cure, DO Authorized by: Lianne Cure, DO   Critical care provider statement:    Critical care time (minutes):  65   Critical care was necessary to treat or prevent imminent or life-threatening deterioration of the following conditions: syncope with head trauma and extensive wound repair on face/eyelid.   Critical care was time spent personally by me on the following activities:  Discussions with consultants, evaluation of patient's response to treatment, examination of patient, ordering and performing treatments and interventions, ordering and review of laboratory studies, ordering and review of radiographic studies, pulse oximetry, re-evaluation of patient's condition, obtaining history from patient or surrogate and review of old charts   Care discussed with comment:  ENT/Trauma consult   Medications Ordered  in ED Medications  lidocaine-EPINEPHrine-tetracaine (LET) topical gel (3 mLs Topical Given 08/08/21 1000)  doxycycline (VIBRA-TABS) tablet 100 mg (100 mg Oral Given 08/08/21 1354)    ED Course  I have reviewed the triage vital signs and the nursing notes.  Pertinent labs & imaging results that were available during my care of the patient were reviewed by me and considered in my medical decision making (see chart for details).    MDM Rules/Calculators/A&P  7:49 AM  60 yo male presenting via EMS for syncope at work with blunt head trauma. Pt has stellate laceration to right supraorbital/lateral region. Normal EOM. PERRLA. Vision grossly intact.   CT head and neck demonstrates: 1. Negative CT of the head 2. Right periorbital soft tissue hematoma and laceration 3. Negative for cervical fracture. Cervical spondylosis and kyphosis.  Dr. Wilburn Cornelia, on call for ENT/plastics/trauma, evaluated CT images with recommendations for laceration repair in ED and follow up outpatient in 10 days with wound care recs.   Wound irrigated and closed with approx 10 sutures. Tdap given.   Of note patient has leukocytosis of 44. Pt denies any fevers, chills, nausea, vomiting, diarrhea, or urinary complaints. No other signs/symptoms of sepsis. Abdomen soft and nontender-no abdominal imaging recommended at this time. UA negative for UTI. Covid pending. CXR demonstrates possible adenopathy. CT chest demonstrates:  -Bilateral axillary and left supraclavicular lymphadenopathy. Few prominent mediastinal and epigastric lymph nodes. -Peripheral left basilar opacity which could be due to focal volume loss or developing infection.  -Diffuse hepatic steatosis.  Findings discussed with patient and recommendations to follow with pcp.   Patient in no distress and overall condition improved here in the ED. Detailed discussions were had with the patient regarding current findings, and need for close f/u  with PCP or on call doctor. The patient has been instructed to return immediately if the symptoms worsen in any way for re-evaluation. Patient verbalized understanding and is in agreement with current care plan. All questions answered prior to discharge.  Final Clinical Impression(s) / ED Diagnoses Final diagnoses:  Syncope and collapse  Community acquired pneumonia, unspecified laterality  Laceration of skin of right eyelid and periocular area, initial encounter  Hyphema of right eye    Rx / DC Orders ED Discharge Orders          Ordered    doxycycline (VIBRAMYCIN) 100 MG capsule  2 times daily        08/08/21 1344    acetaminophen (TYLENOL) 325 MG tablet  Every 6 hours PRN        08/08/21 1344             Lianne Cure, DO [id number] [phone]

## 2021-08-08 NOTE — Discharge Instructions (Addendum)
Please follow up with specialist for suture removal of 10 sutures and evaluation of extensive complex laceration repair in 10 days.   -Keep wound dry and clean -Cover with gauze after showers -Fill prescription and take for pneumonia  You also have a Hyphema (blood around the eye) -Decrease heavy lifting -Sleep upright for the next 3 days -No alcohol or motrin -Tylenol for pain -Return to ED immediately for vision changes

## 2021-08-11 LAB — PATHOLOGIST SMEAR REVIEW

## 2022-09-01 ENCOUNTER — Emergency Department (HOSPITAL_COMMUNITY): Payer: Medicaid Other

## 2022-09-01 ENCOUNTER — Inpatient Hospital Stay (HOSPITAL_COMMUNITY): Payer: Medicaid Other

## 2022-09-01 ENCOUNTER — Inpatient Hospital Stay (HOSPITAL_COMMUNITY): Payer: Medicaid Other | Admitting: Certified Registered"

## 2022-09-01 ENCOUNTER — Inpatient Hospital Stay (HOSPITAL_COMMUNITY)
Admission: EM | Admit: 2022-09-01 | Discharge: 2022-12-05 | DRG: 003 | Disposition: E | Payer: Medicaid Other | Attending: Internal Medicine | Admitting: Internal Medicine

## 2022-09-01 ENCOUNTER — Encounter (HOSPITAL_COMMUNITY): Admission: EM | Disposition: E | Payer: Self-pay | Source: Home / Self Care | Attending: Internal Medicine

## 2022-09-01 ENCOUNTER — Encounter (HOSPITAL_COMMUNITY): Payer: Self-pay | Admitting: Neurosurgery

## 2022-09-01 DIAGNOSIS — I611 Nontraumatic intracerebral hemorrhage in hemisphere, cortical: Secondary | ICD-10-CM | POA: Diagnosis present

## 2022-09-01 DIAGNOSIS — I10 Essential (primary) hypertension: Secondary | ICD-10-CM | POA: Diagnosis present

## 2022-09-01 DIAGNOSIS — I607 Nontraumatic subarachnoid hemorrhage from unspecified intracranial artery: Secondary | ICD-10-CM | POA: Diagnosis present

## 2022-09-01 DIAGNOSIS — J189 Pneumonia, unspecified organism: Secondary | ICD-10-CM | POA: Diagnosis not present

## 2022-09-01 DIAGNOSIS — I629 Nontraumatic intracranial hemorrhage, unspecified: Secondary | ICD-10-CM

## 2022-09-01 DIAGNOSIS — G919 Hydrocephalus, unspecified: Secondary | ICD-10-CM | POA: Diagnosis present

## 2022-09-01 DIAGNOSIS — R319 Hematuria, unspecified: Secondary | ICD-10-CM | POA: Diagnosis present

## 2022-09-01 DIAGNOSIS — J15211 Pneumonia due to Methicillin susceptible Staphylococcus aureus: Secondary | ICD-10-CM | POA: Diagnosis not present

## 2022-09-01 DIAGNOSIS — R6520 Severe sepsis without septic shock: Secondary | ICD-10-CM | POA: Clinically undetermined

## 2022-09-01 DIAGNOSIS — F32A Depression, unspecified: Secondary | ICD-10-CM | POA: Diagnosis present

## 2022-09-01 DIAGNOSIS — J9811 Atelectasis: Secondary | ICD-10-CM | POA: Diagnosis not present

## 2022-09-01 DIAGNOSIS — R197 Diarrhea, unspecified: Secondary | ICD-10-CM | POA: Diagnosis not present

## 2022-09-01 DIAGNOSIS — Z66 Do not resuscitate: Secondary | ICD-10-CM | POA: Diagnosis not present

## 2022-09-01 DIAGNOSIS — I69320 Aphasia following cerebral infarction: Secondary | ICD-10-CM | POA: Diagnosis not present

## 2022-09-01 DIAGNOSIS — G935 Compression of brain: Secondary | ICD-10-CM | POA: Diagnosis present

## 2022-09-01 DIAGNOSIS — I6389 Other cerebral infarction: Secondary | ICD-10-CM

## 2022-09-01 DIAGNOSIS — R1312 Dysphagia, oropharyngeal phase: Secondary | ICD-10-CM | POA: Diagnosis not present

## 2022-09-01 DIAGNOSIS — I615 Nontraumatic intracerebral hemorrhage, intraventricular: Secondary | ICD-10-CM | POA: Diagnosis present

## 2022-09-01 DIAGNOSIS — Z9911 Dependence on respirator [ventilator] status: Secondary | ICD-10-CM

## 2022-09-01 DIAGNOSIS — E860 Dehydration: Secondary | ICD-10-CM | POA: Diagnosis present

## 2022-09-01 DIAGNOSIS — E1165 Type 2 diabetes mellitus with hyperglycemia: Secondary | ICD-10-CM | POA: Diagnosis present

## 2022-09-01 DIAGNOSIS — E8770 Fluid overload, unspecified: Secondary | ICD-10-CM | POA: Diagnosis not present

## 2022-09-01 DIAGNOSIS — Z20822 Contact with and (suspected) exposure to covid-19: Secondary | ICD-10-CM | POA: Diagnosis present

## 2022-09-01 DIAGNOSIS — R509 Fever, unspecified: Secondary | ICD-10-CM

## 2022-09-01 DIAGNOSIS — I618 Other nontraumatic intracerebral hemorrhage: Secondary | ICD-10-CM

## 2022-09-01 DIAGNOSIS — I161 Hypertensive emergency: Secondary | ICD-10-CM | POA: Diagnosis present

## 2022-09-01 DIAGNOSIS — R600 Localized edema: Secondary | ICD-10-CM | POA: Diagnosis present

## 2022-09-01 DIAGNOSIS — J95851 Ventilator associated pneumonia: Secondary | ICD-10-CM | POA: Diagnosis not present

## 2022-09-01 DIAGNOSIS — N179 Acute kidney failure, unspecified: Secondary | ICD-10-CM | POA: Diagnosis not present

## 2022-09-01 DIAGNOSIS — C9110 Chronic lymphocytic leukemia of B-cell type not having achieved remission: Secondary | ICD-10-CM | POA: Diagnosis present

## 2022-09-01 DIAGNOSIS — Z930 Tracheostomy status: Secondary | ICD-10-CM | POA: Diagnosis not present

## 2022-09-01 DIAGNOSIS — B9689 Other specified bacterial agents as the cause of diseases classified elsewhere: Secondary | ICD-10-CM | POA: Diagnosis not present

## 2022-09-01 DIAGNOSIS — I82513 Chronic embolism and thrombosis of femoral vein, bilateral: Secondary | ICD-10-CM | POA: Diagnosis present

## 2022-09-01 DIAGNOSIS — G911 Obstructive hydrocephalus: Secondary | ICD-10-CM

## 2022-09-01 DIAGNOSIS — D696 Thrombocytopenia, unspecified: Secondary | ICD-10-CM | POA: Diagnosis not present

## 2022-09-01 DIAGNOSIS — W19XXXA Unspecified fall, initial encounter: Secondary | ICD-10-CM | POA: Diagnosis not present

## 2022-09-01 DIAGNOSIS — I619 Nontraumatic intracerebral hemorrhage, unspecified: Secondary | ICD-10-CM | POA: Diagnosis present

## 2022-09-01 DIAGNOSIS — I720 Aneurysm of carotid artery: Secondary | ICD-10-CM | POA: Diagnosis present

## 2022-09-01 DIAGNOSIS — I469 Cardiac arrest, cause unspecified: Secondary | ICD-10-CM | POA: Diagnosis not present

## 2022-09-01 DIAGNOSIS — J9602 Acute respiratory failure with hypercapnia: Secondary | ICD-10-CM | POA: Diagnosis not present

## 2022-09-01 DIAGNOSIS — I4729 Other ventricular tachycardia: Secondary | ICD-10-CM | POA: Diagnosis not present

## 2022-09-01 DIAGNOSIS — R590 Localized enlarged lymph nodes: Secondary | ICD-10-CM | POA: Diagnosis present

## 2022-09-01 DIAGNOSIS — J9801 Acute bronchospasm: Secondary | ICD-10-CM | POA: Diagnosis not present

## 2022-09-01 DIAGNOSIS — Z515 Encounter for palliative care: Secondary | ICD-10-CM

## 2022-09-01 DIAGNOSIS — Z7189 Other specified counseling: Secondary | ICD-10-CM

## 2022-09-01 DIAGNOSIS — J9601 Acute respiratory failure with hypoxia: Secondary | ICD-10-CM | POA: Diagnosis present

## 2022-09-01 DIAGNOSIS — L8990 Pressure ulcer of unspecified site, unspecified stage: Secondary | ICD-10-CM | POA: Insufficient documentation

## 2022-09-01 DIAGNOSIS — I472 Ventricular tachycardia, unspecified: Secondary | ICD-10-CM | POA: Diagnosis present

## 2022-09-01 DIAGNOSIS — J1569 Pneumonia due to other gram-negative bacteria: Secondary | ICD-10-CM | POA: Diagnosis present

## 2022-09-01 DIAGNOSIS — Z6841 Body Mass Index (BMI) 40.0 and over, adult: Secondary | ICD-10-CM | POA: Diagnosis not present

## 2022-09-01 DIAGNOSIS — I610 Nontraumatic intracerebral hemorrhage in hemisphere, subcortical: Principal | ICD-10-CM | POA: Diagnosis present

## 2022-09-01 DIAGNOSIS — G8194 Hemiplegia, unspecified affecting left nondominant side: Secondary | ICD-10-CM | POA: Diagnosis present

## 2022-09-01 DIAGNOSIS — R7989 Other specified abnormal findings of blood chemistry: Secondary | ICD-10-CM | POA: Diagnosis not present

## 2022-09-01 DIAGNOSIS — E870 Hyperosmolality and hypernatremia: Secondary | ICD-10-CM

## 2022-09-01 DIAGNOSIS — R4182 Altered mental status, unspecified: Secondary | ICD-10-CM | POA: Diagnosis present

## 2022-09-01 DIAGNOSIS — R531 Weakness: Secondary | ICD-10-CM

## 2022-09-01 DIAGNOSIS — N171 Acute kidney failure with acute cortical necrosis: Secondary | ICD-10-CM | POA: Diagnosis not present

## 2022-09-01 DIAGNOSIS — I491 Atrial premature depolarization: Secondary | ICD-10-CM | POA: Diagnosis present

## 2022-09-01 DIAGNOSIS — D72829 Elevated white blood cell count, unspecified: Secondary | ICD-10-CM

## 2022-09-01 DIAGNOSIS — Z79899 Other long term (current) drug therapy: Secondary | ICD-10-CM

## 2022-09-01 DIAGNOSIS — E875 Hyperkalemia: Secondary | ICD-10-CM | POA: Diagnosis not present

## 2022-09-01 DIAGNOSIS — Y848 Other medical procedures as the cause of abnormal reaction of the patient, or of later complication, without mention of misadventure at the time of the procedure: Secondary | ICD-10-CM | POA: Diagnosis not present

## 2022-09-01 DIAGNOSIS — A419 Sepsis, unspecified organism: Secondary | ICD-10-CM | POA: Diagnosis not present

## 2022-09-01 DIAGNOSIS — J690 Pneumonitis due to inhalation of food and vomit: Secondary | ICD-10-CM | POA: Diagnosis not present

## 2022-09-01 DIAGNOSIS — E876 Hypokalemia: Secondary | ICD-10-CM | POA: Diagnosis not present

## 2022-09-01 DIAGNOSIS — Z87891 Personal history of nicotine dependence: Secondary | ICD-10-CM

## 2022-09-01 DIAGNOSIS — G928 Other toxic encephalopathy: Secondary | ICD-10-CM | POA: Diagnosis present

## 2022-09-01 DIAGNOSIS — Z5900 Homelessness unspecified: Secondary | ICD-10-CM

## 2022-09-01 DIAGNOSIS — Z781 Physical restraint status: Secondary | ICD-10-CM

## 2022-09-01 DIAGNOSIS — I639 Cerebral infarction, unspecified: Secondary | ICD-10-CM | POA: Diagnosis not present

## 2022-09-01 DIAGNOSIS — D72823 Leukemoid reaction: Secondary | ICD-10-CM | POA: Diagnosis not present

## 2022-09-01 DIAGNOSIS — R8281 Pyuria: Secondary | ICD-10-CM | POA: Diagnosis present

## 2022-09-01 DIAGNOSIS — E8729 Other acidosis: Secondary | ICD-10-CM | POA: Diagnosis not present

## 2022-09-01 DIAGNOSIS — L89892 Pressure ulcer of other site, stage 2: Secondary | ICD-10-CM | POA: Clinically undetermined

## 2022-09-01 DIAGNOSIS — B370 Candidal stomatitis: Secondary | ICD-10-CM | POA: Diagnosis not present

## 2022-09-01 DIAGNOSIS — R609 Edema, unspecified: Secondary | ICD-10-CM | POA: Diagnosis not present

## 2022-09-01 DIAGNOSIS — R1310 Dysphagia, unspecified: Secondary | ICD-10-CM | POA: Diagnosis present

## 2022-09-01 DIAGNOSIS — K429 Umbilical hernia without obstruction or gangrene: Secondary | ICD-10-CM | POA: Diagnosis present

## 2022-09-01 DIAGNOSIS — E861 Hypovolemia: Secondary | ICD-10-CM | POA: Diagnosis not present

## 2022-09-01 DIAGNOSIS — I160 Hypertensive urgency: Secondary | ICD-10-CM | POA: Diagnosis present

## 2022-09-01 DIAGNOSIS — L853 Xerosis cutis: Secondary | ICD-10-CM | POA: Diagnosis present

## 2022-09-01 DIAGNOSIS — E8809 Other disorders of plasma-protein metabolism, not elsewhere classified: Secondary | ICD-10-CM | POA: Diagnosis present

## 2022-09-01 DIAGNOSIS — R042 Hemoptysis: Secondary | ICD-10-CM | POA: Diagnosis not present

## 2022-09-01 DIAGNOSIS — I951 Orthostatic hypotension: Secondary | ICD-10-CM | POA: Diagnosis not present

## 2022-09-01 HISTORY — PX: RADIOLOGY WITH ANESTHESIA: SHX6223

## 2022-09-01 HISTORY — PX: IR ANGIO INTRA EXTRACRAN SEL INTERNAL CAROTID BILAT MOD SED: IMG5363

## 2022-09-01 HISTORY — PX: IR TRANSCATH/EMBOLIZ: IMG695

## 2022-09-01 HISTORY — PX: IR ANGIO INTRA EXTRACRAN SEL INTERNAL CAROTID UNI L MOD SED: IMG5361

## 2022-09-01 HISTORY — PX: IR ANGIOGRAM FOLLOW UP STUDY: IMG697

## 2022-09-01 LAB — LIPID PANEL
Cholesterol: 131 mg/dL (ref 0–200)
HDL: 15 mg/dL — ABNORMAL LOW (ref >40)
LDL Cholesterol: UNDETERMINED mg/dL (ref 0–99)
Total CHOL/HDL Ratio: 8.7 RATIO
Triglycerides: 860 mg/dL — ABNORMAL HIGH (ref <150)
VLDL: UNDETERMINED mg/dL (ref 0–40)

## 2022-09-01 LAB — URINALYSIS, ROUTINE W REFLEX MICROSCOPIC
Bilirubin Urine: NEGATIVE
Glucose, UA: NEGATIVE mg/dL
Ketones, ur: NEGATIVE mg/dL
Nitrite: NEGATIVE
Protein, ur: 100 mg/dL — AB
RBC / HPF: >50 RBC/hpf — ABNORMAL HIGH (ref 0–5)
Specific Gravity, Urine: 1.033 — ABNORMAL HIGH (ref 1.005–1.030)
WBC, UA: >50 WBC/hpf — ABNORMAL HIGH (ref 0–5)
pH: 5 (ref 5.0–8.0)

## 2022-09-01 LAB — DIFFERENTIAL
Abs Immature Granulocytes: 0.23 10*3/uL — ABNORMAL HIGH (ref 0.00–0.07)
Basophils Absolute: 0.1 10*3/uL (ref 0.0–0.1)
Basophils Relative: 0 %
Eosinophils Absolute: 0 10*3/uL (ref 0.0–0.5)
Eosinophils Relative: 0 %
Immature Granulocytes: 0 %
Lymphocytes Relative: 83 %
Lymphs Abs: 58.7 10*3/uL — ABNORMAL HIGH (ref 0.7–4.0)
Monocytes Absolute: 2.5 10*3/uL — ABNORMAL HIGH (ref 0.1–1.0)
Monocytes Relative: 4 %
Neutro Abs: 9 10*3/uL — ABNORMAL HIGH (ref 1.7–7.7)
Neutrophils Relative %: 13 %
WBC Morphology: ABNORMAL

## 2022-09-01 LAB — ECHOCARDIOGRAM COMPLETE
AR max vel: 2.54 cm2
AV Area VTI: 2.4 cm2
AV Area mean vel: 2.36 cm2
AV Mean grad: 6 mmHg
AV Peak grad: 11.6 mmHg
Ao pk vel: 1.7 m/s
Area-P 1/2: 2.87 cm2
Height: 67 in
S' Lateral: 3 cm
Weight: 3527.36 oz

## 2022-09-01 LAB — CBC
HCT: 51.2 % (ref 39.0–52.0)
Hemoglobin: 15.5 g/dL (ref 13.0–17.0)
MCH: 29.4 pg (ref 26.0–34.0)
MCHC: 30.3 g/dL (ref 30.0–36.0)
MCV: 97 fL (ref 80.0–100.0)
Platelets: 212 10*3/uL (ref 150–400)
RBC: 5.28 MIL/uL (ref 4.22–5.81)
RDW: 13.5 % (ref 11.5–15.5)
WBC: 70.5 10*3/uL (ref 4.0–10.5)
nRBC: 0.1 % (ref 0.0–0.2)

## 2022-09-01 LAB — RESP PANEL BY RT-PCR (FLU A&B, COVID) ARPGX2
Influenza A by PCR: NEGATIVE
Influenza B by PCR: NEGATIVE
SARS Coronavirus 2 by RT PCR: NEGATIVE

## 2022-09-01 LAB — I-STAT CHEM 8, ED
BUN: 12 mg/dL (ref 8–23)
Calcium, Ion: 1.01 mmol/L — ABNORMAL LOW (ref 1.15–1.40)
Chloride: 100 mmol/L (ref 98–111)
Creatinine, Ser: 0.9 mg/dL (ref 0.61–1.24)
Glucose, Bld: 162 mg/dL — ABNORMAL HIGH (ref 70–99)
HCT: 51 % (ref 39.0–52.0)
Hemoglobin: 17.3 g/dL — ABNORMAL HIGH (ref 13.0–17.0)
Potassium: 3.6 mmol/L (ref 3.5–5.1)
Sodium: 138 mmol/L (ref 135–145)
TCO2: 23 mmol/L (ref 22–32)

## 2022-09-01 LAB — PROTIME-INR
INR: 1.1 (ref 0.8–1.2)
Prothrombin Time: 14.1 seconds (ref 11.4–15.2)

## 2022-09-01 LAB — POCT I-STAT 7, (LYTES, BLD GAS, ICA,H+H)
Acid-base deficit: 1 mmol/L (ref 0.0–2.0)
Bicarbonate: 24.4 mmol/L (ref 20.0–28.0)
Calcium, Ion: 1.06 mmol/L — ABNORMAL LOW (ref 1.15–1.40)
HCT: 45 % (ref 39.0–52.0)
Hemoglobin: 15.3 g/dL (ref 13.0–17.0)
O2 Saturation: 89 %
Patient temperature: 36.7
Potassium: 4.1 mmol/L (ref 3.5–5.1)
Sodium: 137 mmol/L (ref 135–145)
TCO2: 26 mmol/L (ref 22–32)
pCO2 arterial: 42.9 mmHg (ref 32–48)
pH, Arterial: 7.362 (ref 7.35–7.45)
pO2, Arterial: 58 mmHg — ABNORMAL LOW (ref 83–108)

## 2022-09-01 LAB — I-STAT ARTERIAL BLOOD GAS, ED
Acid-base deficit: 1 mmol/L (ref 0.0–2.0)
Bicarbonate: 26.4 mmol/L (ref 20.0–28.0)
Calcium, Ion: 1.17 mmol/L (ref 1.15–1.40)
HCT: 50 % (ref 39.0–52.0)
Hemoglobin: 17 g/dL (ref 13.0–17.0)
O2 Saturation: 89 %
Potassium: 3.6 mmol/L (ref 3.5–5.1)
Sodium: 137 mmol/L (ref 135–145)
TCO2: 28 mmol/L (ref 22–32)
pCO2 arterial: 54.5 mmHg — ABNORMAL HIGH (ref 32–48)
pH, Arterial: 7.293 — ABNORMAL LOW (ref 7.35–7.45)
pO2, Arterial: 63 mmHg — ABNORMAL LOW (ref 83–108)

## 2022-09-01 LAB — RAPID URINE DRUG SCREEN, HOSP PERFORMED
Amphetamines: NOT DETECTED
Barbiturates: NOT DETECTED
Benzodiazepines: NOT DETECTED
Cocaine: NOT DETECTED
Opiates: NOT DETECTED
Tetrahydrocannabinol: NOT DETECTED

## 2022-09-01 LAB — HEMOGLOBIN A1C
Hgb A1c MFr Bld: 6.5 % — ABNORMAL HIGH (ref 4.8–5.6)
Mean Plasma Glucose: 139.85 mg/dL

## 2022-09-01 LAB — LDL CHOLESTEROL, DIRECT: Direct LDL: 80 mg/dL (ref 0–99)

## 2022-09-01 LAB — COMPREHENSIVE METABOLIC PANEL
ALT: 49 U/L — ABNORMAL HIGH (ref 0–44)
AST: 64 U/L — ABNORMAL HIGH (ref 15–41)
Albumin: 3.9 g/dL (ref 3.5–5.0)
Alkaline Phosphatase: 74 U/L (ref 38–126)
Anion gap: 13 (ref 5–15)
BUN: 11 mg/dL (ref 8–23)
CO2: 23 mmol/L (ref 22–32)
Calcium: 8.9 mg/dL (ref 8.9–10.3)
Chloride: 102 mmol/L (ref 98–111)
Creatinine, Ser: 1.05 mg/dL (ref 0.61–1.24)
GFR, Estimated: 48 mL/min — ABNORMAL LOW (ref >60)
Glucose, Bld: 163 mg/dL — ABNORMAL HIGH (ref 70–99)
Potassium: 4.3 mmol/L (ref 3.5–5.1)
Sodium: 138 mmol/L (ref 135–145)
Total Bilirubin: 0.9 mg/dL (ref 0.3–1.2)
Total Protein: 7.6 g/dL (ref 6.5–8.1)

## 2022-09-01 LAB — MRSA NEXT GEN BY PCR, NASAL: MRSA by PCR Next Gen: NOT DETECTED

## 2022-09-01 LAB — ETHANOL: Alcohol, Ethyl (B): <10 mg/dL (ref <10)

## 2022-09-01 LAB — CBG MONITORING, ED: Glucose-Capillary: 160 mg/dL — ABNORMAL HIGH (ref 70–99)

## 2022-09-01 LAB — APTT: aPTT: 21 seconds — ABNORMAL LOW (ref 24–36)

## 2022-09-01 SURGERY — IR WITH ANESTHESIA
Anesthesia: General

## 2022-09-01 MED ORDER — ROCURONIUM BROMIDE 10 MG/ML (PF) SYRINGE
PREFILLED_SYRINGE | INTRAVENOUS | Status: DC | PRN
  Administered 2022-09-01 (×2): 50 mg via INTRAVENOUS

## 2022-09-01 MED ORDER — LABETALOL HCL 5 MG/ML IV SOLN
20.0000 mg | Freq: Once | INTRAVENOUS | Status: DC
  Administered 2022-09-01: 10 mg via INTRAVENOUS

## 2022-09-01 MED ORDER — SODIUM CHLORIDE 0.9 % IV SOLN: INTRAVENOUS | Status: DC | PRN

## 2022-09-01 MED ORDER — PROPOFOL 1000 MG/100ML IV EMUL
INTRAVENOUS | Status: AC
  Filled 2022-09-01: qty 100

## 2022-09-01 MED ORDER — IOHEXOL 350 MG/ML SOLN
100.0000 mL | Freq: Once | INTRAVENOUS | Status: AC | PRN
  Administered 2022-09-01: 75 mL via INTRAVENOUS

## 2022-09-01 MED ORDER — PROPOFOL 1000 MG/100ML IV EMUL
0.0000 ug/kg/min | INTRAVENOUS | Status: DC
  Administered 2022-09-01: 30 ug/kg/min via INTRAVENOUS
  Administered 2022-09-01: 15 ug/kg/min via INTRAVENOUS

## 2022-09-01 MED ORDER — POLYETHYLENE GLYCOL 3350 17 G PO PACK
17.0000 g | PACK | Freq: Every day | ORAL | Status: DC
  Administered 2022-09-03 – 2022-09-04 (×2): 17 g
  Filled 2022-09-01 (×2): qty 1

## 2022-09-01 MED ORDER — DEXMEDETOMIDINE HCL IN NACL 400 MCG/100ML IV SOLN
0.4000 ug/kg/h | INTRAVENOUS | Status: DC
  Administered 2022-09-01: 0.4 ug/kg/h via INTRAVENOUS
  Administered 2022-09-01: 0.5 ug/kg/h via INTRAVENOUS
  Administered 2022-09-02: 0.4 ug/kg/h via INTRAVENOUS
  Filled 2022-09-01 (×4): qty 100

## 2022-09-01 MED ORDER — CLEVIDIPINE BUTYRATE 0.5 MG/ML IV EMUL
INTRAVENOUS | Status: AC
  Filled 2022-09-01: qty 50

## 2022-09-01 MED ORDER — ETOMIDATE 2 MG/ML IV SOLN
INTRAVENOUS | Status: AC | PRN
  Administered 2022-09-01: 20 mg via INTRAVENOUS

## 2022-09-01 MED ORDER — ACETAMINOPHEN 325 MG PO TABS
650.0000 mg | ORAL_TABLET | ORAL | Status: DC | PRN
  Administered 2022-09-27: 650 mg via ORAL
  Filled 2022-09-01: qty 2

## 2022-09-01 MED ORDER — SENNOSIDES-DOCUSATE SODIUM 8.6-50 MG PO TABS
1.0000 | ORAL_TABLET | Freq: Two times a day (BID) | ORAL | Status: DC
  Filled 2022-09-01: qty 1

## 2022-09-01 MED ORDER — ORAL CARE MOUTH RINSE: 15.0000 mL | OROMUCOSAL | Status: DC | PRN

## 2022-09-01 MED ORDER — ACETAMINOPHEN 160 MG/5ML PO SOLN
650.0000 mg | ORAL | Status: DC | PRN
  Administered 2022-09-02 – 2022-10-20 (×26): 650 mg
  Filled 2022-09-01 (×27): qty 20.3

## 2022-09-01 MED ORDER — ACETAMINOPHEN 650 MG RE SUPP
650.0000 mg | RECTAL | Status: DC | PRN
  Administered 2022-09-01: 650 mg via RECTAL
  Filled 2022-09-01: qty 1

## 2022-09-01 MED ORDER — CHLORHEXIDINE GLUCONATE CLOTH 2 % EX PADS
6.0000 | MEDICATED_PAD | Freq: Every day | CUTANEOUS | Status: DC
  Administered 2022-09-01 – 2022-09-06 (×9): 6 via TOPICAL

## 2022-09-01 MED ORDER — ONDANSETRON HCL 4 MG/2ML IJ SOLN
INTRAMUSCULAR | Status: DC | PRN
  Administered 2022-09-01: 4 mg via INTRAVENOUS

## 2022-09-01 MED ORDER — FENTANYL 2500MCG IN NS 250ML (10MCG/ML) PREMIX INFUSION
50.0000 ug/h | INTRAVENOUS | Status: DC
  Administered 2022-09-01: 150 ug/h via INTRAVENOUS
  Administered 2022-09-01: 50 ug/h via INTRAVENOUS
  Filled 2022-09-01: qty 250

## 2022-09-01 MED ORDER — ORAL CARE MOUTH RINSE
15.0000 mL | OROMUCOSAL | Status: DC
  Administered 2022-09-01 – 2022-09-25 (×272): 15 mL via OROMUCOSAL

## 2022-09-01 MED ORDER — PANTOPRAZOLE SODIUM 40 MG IV SOLR
40.0000 mg | Freq: Every day | INTRAVENOUS | Status: DC
  Administered 2022-09-02: 40 mg via INTRAVENOUS
  Filled 2022-09-01: qty 10

## 2022-09-01 MED ORDER — IOHEXOL 300 MG/ML  SOLN
100.0000 mL | Freq: Once | INTRAMUSCULAR | Status: AC | PRN
  Administered 2022-09-01: 25 mL via INTRA_ARTERIAL

## 2022-09-01 MED ORDER — DOCUSATE SODIUM 50 MG/5ML PO LIQD
100.0000 mg | Freq: Two times a day (BID) | ORAL | Status: DC
  Administered 2022-09-02 – 2022-09-04 (×7): 100 mg
  Filled 2022-09-01 (×7): qty 10

## 2022-09-01 MED ORDER — FENTANYL BOLUS VIA INFUSION: 50.0000 ug | INTRAVENOUS | Status: DC | PRN

## 2022-09-01 MED ORDER — FENTANYL CITRATE PF 50 MCG/ML IJ SOSY
50.0000 ug | PREFILLED_SYRINGE | INTRAMUSCULAR | Status: DC | PRN
  Administered 2022-09-01: 100 ug via INTRAVENOUS
  Administered 2022-09-01: 50 ug via INTRAVENOUS
  Administered 2022-09-02 (×2): 100 ug via INTRAVENOUS
  Filled 2022-09-01: qty 4

## 2022-09-01 MED ORDER — IOHEXOL 300 MG/ML  SOLN
100.0000 mL | Freq: Once | INTRAMUSCULAR | Status: AC | PRN
  Administered 2022-09-01: 70 mL via INTRA_ARTERIAL

## 2022-09-01 MED ORDER — CLEVIDIPINE BUTYRATE 0.5 MG/ML IV EMUL
0.0000 mg/h | INTRAVENOUS | Status: DC
  Administered 2022-09-01: 2 mg/h via INTRAVENOUS
  Administered 2022-09-01: 32 mg/h via INTRAVENOUS
  Administered 2022-09-01: 16 mg/h via INTRAVENOUS
  Administered 2022-09-02 – 2022-09-04 (×3): 2 mg/h via INTRAVENOUS
  Administered 2022-09-05: 8 mg/h via INTRAVENOUS
  Administered 2022-09-05: 7 mg/h via INTRAVENOUS
  Administered 2022-09-05: 8 mg/h via INTRAVENOUS
  Administered 2022-09-05: 6 mg/h via INTRAVENOUS
  Administered 2022-09-06: 2.5 mg/h via INTRAVENOUS
  Administered 2022-09-07: 2 mg/h via INTRAVENOUS
  Filled 2022-09-01 (×3): qty 100
  Filled 2022-09-01: qty 50
  Filled 2022-09-01 (×4): qty 100
  Filled 2022-09-01: qty 50
  Filled 2022-09-01 (×6): qty 100

## 2022-09-01 MED ORDER — STROKE: EARLY STAGES OF RECOVERY BOOK: Freq: Once | Status: AC

## 2022-09-01 MED ORDER — ROCURONIUM BROMIDE 50 MG/5ML IV SOLN
INTRAVENOUS | Status: AC | PRN
  Administered 2022-09-01: 90 mg via INTRAVENOUS

## 2022-09-01 MED ORDER — FENTANYL CITRATE PF 50 MCG/ML IJ SOSY: 50.0000 ug | PREFILLED_SYRINGE | INTRAMUSCULAR | Status: DC | PRN

## 2022-09-01 MED ORDER — FENTANYL CITRATE PF 50 MCG/ML IJ SOSY: 50.0000 ug | PREFILLED_SYRINGE | Freq: Once | INTRAMUSCULAR | Status: DC

## 2022-09-01 MED ORDER — LABETALOL HCL 5 MG/ML IV SOLN
0.5000 mg/min | Status: DC
  Filled 2022-09-01: qty 80

## 2022-09-01 MED ORDER — EPHEDRINE SULFATE-NACL 50-0.9 MG/10ML-% IV SOSY
PREFILLED_SYRINGE | INTRAVENOUS | Status: DC | PRN
  Administered 2022-09-01: 5 mg via INTRAVENOUS

## 2022-09-01 MED ORDER — FENTANYL 2500MCG IN NS 250ML (10MCG/ML) PREMIX INFUSION: 0.0000 ug/h | INTRAVENOUS | Status: DC

## 2022-09-01 MED ORDER — DEXAMETHASONE SODIUM PHOSPHATE 10 MG/ML IJ SOLN
INTRAMUSCULAR | Status: DC | PRN
  Administered 2022-09-01: 10 mg via INTRAVENOUS

## 2022-09-01 NOTE — Progress Notes (Signed)
eLink Physician-Brief Progress Note Patient Name: Dustin Jones DOB: 12/05/1875 MRN: [id number]   Date of Service  09-17-2022  HPI/Events of Note  Agitation - Nursing request for R wrist restraint and Fentanyl IV infusion.  eICU Interventions  Plan: Fentanyl IV infusion. Titrate to RASS = 0. R soft wrist restraint X 13 hours.      Intervention Category Major Interventions: Delirium, psychosis, severe agitation - evaluation and management  Lysle Dingwall 2022/09/17, 7:38 PM

## 2022-09-01 NOTE — ED Notes (Signed)
Pt Cleviprex stopped at this time and propofol titrated. Pt systolic BP 97.

## 2022-09-01 NOTE — H&P (Signed)
Dustin Jones is an 61 y.o. male.   Chief Complaint: Intracranial hemorrhage HPI: Patient presented to the [facility] as a code stroke this morning. He is homeless and was last seen walking around at approximately 3 am. He was found this morning around 8 am and bystanders called 911. EMS noted the patient with left-sided deficits and altered mental status. CT imaging performed in the [facility] Emergency Department revealed an acute intraparenchymal hemorrhage with possible lobulated 5 mm outpouching arising from the supraclinoid right ICA that is suspicious for ruptured aneurysm. Neurosurgery is admitting for close neurological monitoring and further diagnostic testing.  No past medical history on file.   No family history on file. Social History:  has no history on file for tobacco use, alcohol use, and drug use.  Allergies: Not on File  (Not in a hospital admission)   Results for orders placed or performed during the hospital encounter of 2022-09-09 (from the past 48 hour(s))  Ethanol     Status: None   Collection Time: 09-09-2022  9:00 AM  Result Value Ref Range   Alcohol, Ethyl (B) <10 <10 mg/dL    Comment: (NOTE) Lowest detectable limit for serum alcohol is 10 mg/dL.  For medical purposes only. Performed at [facility], [address] [place], [place] [address]   Protime-INR     Status: None   Collection Time: 09-Sep-2022  9:00 AM  Result Value Ref Range   Prothrombin Time 14.1 11.4 - 15.2 seconds   INR 1.1 0.8 - 1.2    Comment: (NOTE) INR goal varies based on device and disease states. Performed at [facility], [address] [place], [place] [address]   APTT     Status: Abnormal   Collection Time: 09/09/2022  9:00 AM  Result Value Ref Range   aPTT 21 (L) 24 - 36 seconds    Comment: Performed at [facility], [address] [place], [place] [address]  CBC     Status: Abnormal   Collection Time: 09-09-22  9:00 AM  Result Value  Ref Range   WBC 70.5 (HH) 4.0 - 10.5 K/uL    Comment: REPEATED TO VERIFY WHITE COUNT CONFIRMED ON SMEAR THIS CRITICAL RESULT HAS VERIFIED AND BEEN CALLED TO P. FURROW, RN BY JULIE MACEDA DEL ANGEL ON 09 28 2023 AT 1023, AND HAS BEEN READ BACK.     RBC 5.28 4.22 - 5.81 MIL/uL   Hemoglobin 15.5 13.0 - 17.0 g/dL   HCT 25.6 38.9 - 37.3 %   MCV 97.0 80.0 - 100.0 fL   MCH 29.4 26.0 - 34.0 pg   MCHC 30.3 30.0 - 36.0 g/dL   RDW 42.8 76.8 - 11.5 %   Platelets 212 150 - 400 K/uL   nRBC 0.1 0.0 - 0.2 %    Comment: Performed at [facility], [address] [place], [place] [address]  Differential     Status: Abnormal   Collection Time: September 09, 2022  9:00 AM  Result Value Ref Range   Neutrophils Relative % 13 %   Neutro Abs 9.0 (H) 1.7 - 7.7 K/uL   Lymphocytes Relative 83 %   Lymphs Abs 58.7 (H) 0.7 - 4.0 K/uL   Monocytes Relative 4 %   Monocytes Absolute 2.5 (H) 0.1 - 1.0 K/uL   Eosinophils Relative 0 %   Eosinophils Absolute 0.0 0.0 - 0.5 K/uL   Basophils Relative 0 %   Basophils Absolute 0.1  0.0 - 0.1 K/uL   WBC Morphology Abnormal lymphocytes present     Comment: ABSOLUTE LYMPHOCYTOSIS   RBC Morphology MORPHOLOGY UNREMARKABLE    Smear Review MORPHOLOGY UNREMARKABLE    Immature Granulocytes 0 %   Abs Immature Granulocytes 0.23 (H) 0.00 - 0.07 K/uL   Smudge Cells PRESENT     Comment: Performed at [facility], [address], [place], [place] [address]  Comprehensive metabolic panel     Status: Abnormal   Collection Time: 09/18/22  9:00 AM  Result Value Ref Range   Sodium 138 135 - 145 mmol/L   Potassium 4.3 3.5 - 5.1 mmol/L   Chloride 102 98 - 111 mmol/L   CO2 23 22 - 32 mmol/L   Glucose, Bld 163 (H) 70 - 99 mg/dL    Comment: Glucose reference range applies only to samples taken after fasting for at least 8 hours.   BUN 11 8 - 23 mg/dL   Creatinine, Ser 8.46 0.61 - 1.24 mg/dL   Calcium 8.9 8.9 - 96.2 mg/dL   Total Protein 7.6 6.5 - 8.1 g/dL   Albumin 3.9 3.5 - 5.0  g/dL   AST 64 (H) 15 - 41 U/L   ALT 49 (H) 0 - 44 U/L   Alkaline Phosphatase 74 38 - 126 U/L   Total Bilirubin 0.9 0.3 - 1.2 mg/dL   GFR, Estimated 48 (L) >60 mL/min    Comment: (NOTE) Calculated using the CKD-EPI Creatinine Equation (2021)    Anion gap 13 5 - 15    Comment: Performed at [facility], [address] [place], [place] [address]  CBG monitoring, ED     Status: Abnormal   Collection Time: 18-Sep-2022  9:00 AM  Result Value Ref Range   Glucose-Capillary 160 (H) 70 - 99 mg/dL    Comment: Glucose reference range applies only to samples taken after fasting for at least 8 hours.  I-stat chem 8, ED     Status: Abnormal   Collection Time: 09-18-22  9:08 AM  Result Value Ref Range   Sodium 138 135 - 145 mmol/L   Potassium 3.6 3.5 - 5.1 mmol/L   Chloride 100 98 - 111 mmol/L   BUN 12 8 - 23 mg/dL   Creatinine, Ser 1.32 0.61 - 1.24 mg/dL   Glucose, Bld 440 (H) 70 - 99 mg/dL    Comment: Glucose reference range applies only to samples taken after fasting for at least 8 hours.   Calcium, Ion 1.01 (L) 1.15 - 1.40 mmol/L   TCO2 23 22 - 32 mmol/L   Hemoglobin 17.3 (H) 13.0 - 17.0 g/dL   HCT 10.2 72.5 - 36.6 %  I-Stat arterial blood gas, ED     Status: Abnormal   Collection Time: 09-18-22 11:22 AM  Result Value Ref Range   pH, Arterial 7.293 (L) 7.35 - 7.45   pCO2 arterial 54.5 (H) 32 - 48 mmHg   pO2, Arterial 63 (L) 83 - 108 mmHg   Bicarbonate 26.4 20.0 - 28.0 mmol/L   TCO2 28 22 - 32 mmol/L   O2 Saturation 89 %   Acid-base deficit 1.0 0.0 - 2.0 mmol/L   Sodium 137 135 - 145 mmol/L   Potassium 3.6 3.5 - 5.1 mmol/L   Calcium, Ion 1.17 1.15 - 1.40 mmol/L   HCT 50.0 39.0 - 52.0 %   Hemoglobin 17.0 13.0 - 17.0 g/dL   Sample type ARTERIAL    DG [facility] [address]  Result Date: 2022/01/01 CLINICAL DATA:  Male of unknown age code stroke presentation. Right lentiform hemorrhage with intraventricular extension on head CT. Intubated. Blunt trauma. EXAM: PORTABLE CHEST 1 VIEW  COMPARISON:  CTA head and neck today reported separately. FINDINGS: Portable AP semi upright view at 0948 hours. Satisfactory endotracheal tube tip between the clavicles and carina. Enteric tube courses to the abdomen and appears to loop in the stomach, left upper quadrant. Low lung volumes. Mediastinal contours are within normal limits. Patchy and confluent left lung, perihilar opacity. Dependent right upper lung opacity better demonstrated by CTA today. No pneumothorax or definite effusion. Paucity of bowel gas in the upper abdomen. No acute osseous abnormality identified. IMPRESSION: 1. Satisfactory ET tube and enteric tube. 2. Patchy and confluent left lung opacity, with dependent right upper lung opacity better demonstrated by CTA today. Consider aspiration and/or pneumonia. No pneumothorax or definite effusion. Electronically Signed   By: H  Hall M.D.   On: 02023/01/28 10:03   CT C-SPINE NO CHARGE  Result Date: 2022/01/01 CLINICAL DATA:  Male of unknown age code stroke presentation. Right lentiform hemorrhage with intraventricular extension on head CT. Intubated. Blunt trauma. EXAM: CT CERVICAL SPINE WITH CONTRAST TECHNIQUE: Multiplanar CT images of the cervical spine were reconstructed from contemporary CTA of the Neck. RADIATION DOSE REDUCTION: This exam was performed according to the departmental dose-optimization program which includes automated exposure control, adjustment of the mA and/or kV according to patient size and/or use of iterative reconstruction technique. CONTRAST:  No additional COMPARISON:  CT Head, CT face, and CTA Head and Neck today. FINDINGS: Alignment: Reversal of cervical lordosis. Cervicothoracic junction alignment is within normal limits. Bilateral posterior element alignment is within normal limits. Skull base and vertebrae: Visualized skull base is intact. No atlanto-occipital dissociation. C1 and C2 appear intact and aligned. No acute osseous abnormality identified. Soft  tissues and spinal canal: No prevertebral fluid or swelling. No visible canal hematoma. Note bilateral cervical lymphadenopathy, but other face and neck soft tissues are reported separately Disc levels: Congenital incomplete segmentation of C3-C4. Chronic severe disc and endplate degeneration C5-C6 through T1-T2. Associated bulky anterior endplate osteophytes, with subsequent interbody ankylosis of C7-T1. Possible developing ankylosis at C6-C7. Mild associated lower cervical spinal stenosis suspected. Upper chest: Reported separately. CT Head and CTA Head and Neck are reported separately. IMPRESSION: 1. No acute traumatic injury identified in the cervical spine. 2. CT Head and CTA Head and Neck are reported separately. 3. Nonspecific cervical Lymphadenopathy, see Face CT reported separately. 4. 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 Mild lower cervical spinal stenosis suspected. Electronically Signed   By: H  Hall M.D.   On: 02023/01/28 10:01   CT MAXILLOFACIAL W CONTRAST  Result Date: 2022/01/01 CLINICAL DATA:  Male of unknown age code stroke presentation. Right lentiform hemorrhage with intraventricular extension on head CT. Intubated. Blunt trauma. EXAM: CT MAXILLOFACIAL WITH CONTRAST TECHNIQUE: Multidetector CT imaging of the maxillofacial structures was performed with intravenous contrast. Multiplanar CT image reconstructions were also generated. RADIATION DOSE REDUCTION: This exam was performed according to the departmental dose-optimization program which includes automated exposure control, adjustment of the mA and/or kV according to patient size and/or use of iterative reconstruction technique. CONTRAST:  13mL OMNIPAQUE IOHEXOL 350 MG/ML SOLN COMPARISON:  Head CT, cervical spine CT, CTA head and neck today reported separately. FINDINGS: Osseous: Widespread  carious dentition. Mandible intact. TMJ alignment within normal limits. No acute  maxilla, zygoma, pterygoid, or nasal bone fracture. Central skull base appears intact. Cervical spine is detailed separately. Orbits: Intact orbital walls. Orbits soft tissues appear symmetric and negative. Sinuses: Paranasal sinuses and mastoids are well aerated. Tympanic cavities are clear. Soft tissues: Intubated. Fluid in the pharynx. Nonspecific adenoid hypertrophy. Retropharyngeal course of the carotid arteries. Parapharyngeal, sublingual, submandibular, masticator, and parotid spaces appear normal. Nonspecific bilateral cervical lymphadenopathy, with individual nodes up to 18 mm short axis (frequent bilateral cervical nodes of 14-15 mm short axis). No cystic or necrotic nodes. Major vascular structures are reported with the CTA separately. Limited intracranial: Reported separately. IMPRESSION: 1. No acute traumatic injury identified in the Face. 2. Nonspecific bilateral cervical lymphadenopathy. Consider an acute Lymphoproliferative disorder. HIV can have a similar appearance. Metastatic disease felt less likely. 3. Poor dentition. 4. CT Head and CTA Head and Neck are reported separately. Electronically Signed   By: Genevie Ann M.D.   On: September 22, 2022 09:57   CT ANGIO HEAD NECK W WO CM (CODE STROKE)  Result Date: Sep 22, 2022 CLINICAL DATA:  Neuro deficit, acute, stroke suspected EXAM: CT ANGIOGRAPHY HEAD AND NECK TECHNIQUE: Multidetector CT imaging of the head and neck was performed using the standard protocol during bolus administration of intravenous contrast. Multiplanar CT image reconstructions and MIPs were obtained to evaluate the vascular anatomy. Carotid stenosis measurements (when applicable) are obtained utilizing NASCET criteria, using the distal internal carotid diameter as the denominator. RADIATION DOSE REDUCTION: This exam was performed according to the departmental dose-optimization program which includes automated  exposure control, adjustment of the mA and/or kV according to patient size and/or use of iterative reconstruction technique. COMPARISON:  None Available. FINDINGS: CTA NECK FINDINGS Aortic arch: Great vessel origins are patent. Right carotid system: Limited assessment due to poor contrast bolus timing with out evidence of greater than 50% stenosis. Left carotid system: Limited assessment due to poor contrast bolus timing with out evidence of greater than 50% stenosis. Vertebral arteries: Very limited evaluation due to poor contrast bolus timing. Vertebral arteries appear patent. Skeleton: Multilevel degenerative change. Other neck: No acute findings. Upper chest: Opacities in bilateral upper lobes. Review of the MIP images confirms the above findings CTA HEAD FINDINGS Anterior circulation: Lobulated 5 mm outpouching arising from the supraclinoid right ICA (series 10, image 103 and series 9, image 98 Bilateral intracranial ICAs, MCAs and ACAs are patent without visible high-grade stenosis. Parotid mild-to-moderate bilateral intracranial ICA stenosis. Posterior circulation: Bilateral intradural vertebral arteries, basilar artery and posterior cerebral arteries are patent. Limited assessment for stenosis due to poor contrast bolus timing. Venous sinuses: As permitted by contrast timing, patent. Review of the MIP images confirms the above findings IMPRESSION: 1. Limited study due to poor contrast bolus timing. There appears to be a lobulated 5 mm outpouching arising from the supraclinoid right ICA, adjacent to the acute intraparenchymal hemorrhage and suspicious for ruptured aneurysm. Catheter arteriogram could better assess. 2. Dependent opacities in bilateral upper lobes, probably aspiration. Findings discussed with Dr. Lorrin Goodell via telephone 9:51 a.m. Electronically Signed   By: Margaretha Sheffield M.D.   On: 09-22-22 09:55   CT HEAD CODE STROKE WO CONTRAST  Result Date: 22-Sep-2022 CLINICAL DATA:  Code stroke.   Neuro deficit, acute, stroke suspected EXAM: CT HEAD WITHOUT CONTRAST TECHNIQUE: Contiguous axial images were obtained from the base of the skull through the vertex without intravenous contrast. RADIATION DOSE REDUCTION: This exam was performed according to the departmental dose-optimization program which includes automated exposure control, adjustment of the mA and/or  kV according to patient size and/or use of iterative reconstruction technique. COMPARISON:  None Available. FINDINGS: Brain: Large acute hemorrhage in the right lentiform nucleus with intraventricular extension to the lateral ventricles, third ventricle, and fourth ventricle. Acute hemorrhage measures up to approximately 7.0 x 2.3 x 3.6 cm (estimated volume of 29 mL). There may be a small amount of subdural hemorrhage along the falx. Slight rounding of the temporal horns. Approximately 7 mm of leftward midline shift at the foramen of Monro. Mild effacement of the suprasellar cistern and sulcal effacement. No evidence of acute large vascular territory infarct or mass lesion. Vascular: No hyperdense vessel identified. Skull: No acute fracture. Sinuses/Orbits: Clear visualized sinuses. No acute orbital findings. IMPRESSION: 1. Large acute hemorrhage in the right lentiform nucleus with intraventricular extension. Approximately 7 mm of leftward midline shift. 2. Slight rounding of the temporal horns, which could represent early hydrocephalus. Recommend attention on follow-up. Findings discussed with Dr. Erasmo Downer via telephone at 9:30 a.m. Electronically Signed   By: Feliberto Harts M.D.   On: 2022/09/24 09:33    Review of Systems  Unable to perform ROS: Intubated    Blood pressure 118/72, pulse 65, resp. rate 20, weight 100 kg, SpO2 100 %. Physical Exam Constitutional:      Appearance: He is obese. He is ill-appearing.     Interventions: He is intubated.  HENT:     Head: Normocephalic and atraumatic.     Nose: Nose normal.      Mouth/Throat:     Mouth: Mucous membranes are moist.     Pharynx: Oropharynx is clear.  Cardiovascular:     Rate and Rhythm: Normal rate and regular rhythm.     Pulses: Normal pulses.  Pulmonary:     Effort: He is intubated.  Abdominal:     General: There is distension.     Hernia: A hernia is present.  Musculoskeletal:     Comments: No movement to painful stimulus. Patient is on Propofol and fentanyl. Paused infusions for one minute for neuro exam.  Skin:    General: Skin is warm and dry.  Neurological:     Mental Status: He is unresponsive.     Comments: Neuro exam limited due to sedation.      Assessment/Plan Patient will be admitted to the ICU for frequent neuro checks and ventilator management. Dr. Conchita Paris will perform an arteriogram later today to better assess for vascular abnormality. There is no current indication for an external ventricular drain. Will continue to monitor patient for hydrocephalus. CCM has been consulted for ventilator management.  Floreen Comber, NP 2022-09-24, 11:28 AM

## 2022-09-01 NOTE — ED Notes (Addendum)
Pt cash, bus card and receipt found in pt right pants pocket locked up with security at this time under current pt chart name and medical record number.

## 2022-09-01 NOTE — Progress Notes (Signed)
Rec'd call from MRI and was told pt needed a KUB and CXR before taking him to MRI to r/o any foreign metal... orders placed for KUB and CXR.

## 2022-09-01 NOTE — Consult Note (Signed)
NAME:  Dustin Jones, MRN:  [id number], DOB:  12/05/1875, LOS: 0 ADMISSION DATE:  Sep 11, 2022, CONSULTATION DATE:  2022-09-11 REFERRING MD:  Maryan Rued, MD, CHIEF COMPLAINT:  Intracranial hemorrhage   History of Present Illness:  Patient with an unknown PMH  who is reportedly homeless presented to the [facility] ED as a code stroke this morning. He was last seen walking around at 3 am. Found down this morning at 8 am by bystanders who called 911. EMS reported the pt had L-sided deficits and AMS. Systolic BP reported to be in the 190's per EMS. CT imaging in ED revealed acute intraparenchymal hemorrhage with possible lobulated 27mm outpouching arising from the supraclinoid right ICA that is suspicious for ruptured aneurysm. Was admitted by Neurosurgery for close monitoring and further diagnostic testing. CCM has been consulted for ventilator management.   Pertinent  Medical History  No past medical history on file.   Significant Hospital Events: Including procedures, antibiotic start and stop dates in addition to other pertinent events     Interim History / Subjective:  As above.   Objective   Blood pressure 118/71, pulse 64, temperature 98.7 F (37.1 C), resp. rate (!) 22, height 5\' 7"  (1.702 m), weight 100 kg, SpO2 100 %.    Vent Mode: PRVC FiO2 (%):  [100 %] 100 % Set Rate:  [15 bmp-22 bmp] 22 bmp Vt Set:  [510 mL-520 mL] 520 mL PEEP:  [12 cmH20] 12 cmH20 Plateau Pressure:  [25 cmH20] 25 cmH20   Intake/Output Summary (Last 24 hours) at Sep 11, 2022 1447 Last data filed at September 11, 2022 1134 Gross per 24 hour  Intake 144.05 ml  Output 200 ml  Net -55.95 ml   Filed Weights   September 11, 2022 1009  Weight: 100 kg    Examination: General: Sedated, ill-appearing male laying in bed  HENT: Normocephalic and atraumatic. Orally intubated with hemorrhagic crust appreciated on mucosal lips. No bite marks appreciated.   Lungs: CTA bilaterally  Cardiovascular: RRR. No M/R/G Abdomen: Distended. Umbilical  hernia noted.  Extremities: Warm, dry. Lower extremity edema with severe xerosis.  Neuro: Limited exam due to pt being heavily sedated. PERL. Corneal reflex appreciated on R, not as well appreciated on L. Moving RLE.   Resolved Hospital Problem list     Assessment & Plan:  Intracranial Hemorrhage/ R MCA ruptured aneurysm Non-con CT of head demonstrating large acute hemorrhage in the R lentiform nucleus with with intraventricular extension, midline shift, and slight rounding of the temporal horns, possibly representing early hydrocephalus. On CTA, pt found to have a R MCA ruptured aneurysm. Neurosurgery consulted and following with plans for possible arterial clipping this evening. CT of the face and cervical spine did not show any acute traumatic injuries, though BL cervical lymphadenopathy was noted.  - Neuro & Neurosurg following - Triglycerides 860  - HbA1c 6.5, gluc 162   - BAC <10, f/u UDS - Echo with normal biventricular function without evidence of hemodynamically sig valvular heart disease.  - ECG: sinus rhythm, atrial premature complexes and borderline prologned QT interval - F/u brain MRI  - PT/OT consulted   Hypertensive Emergency Pt with BP as high as 269S systolic in ED. Started on cleviplex and labetalol gtt  with goal 130-150  - Cont titrated Cleviplex and labetalol   Severe Leukocytosis Pt's CBC w/ diff demonstrated significant leukocytosis to 70.5 with 50 abnormal lymphocytes. Also noted to have bilateral cervical lymphadenopathy on CT of neck. CXR also notable for a patchy and confluent L lung opacity  and dependent RUL opacity. Infectious etiologies and malignancy are on the differential, will await further imaging and pathologist review.  - Pathologist smear review pending    Best Practice (right click and "Reselect all SmartList Selections" daily)   Diet/type: NPO DVT prophylaxis: not indicated GI prophylaxis: PPI Lines: N/A Foley:  Yes, and it is still  needed Code Status:  full code   Labs   CBC: Recent Labs  Lab 2022/09/19 0900 Sep 19, 2022 0908 09-19-2022 1122  WBC 70.5*  --   --   NEUTROABS 9.0*  --   --   HGB 15.5 17.3* 17.0  HCT 51.2 51.0 50.0  MCV 97.0  --   --   PLT 212  --   --     Basic Metabolic Panel: Recent Labs  Lab 09-19-2022 0900 2022-09-19 0908 Sep 19, 2022 1122  NA 138 138 137  K 4.3 3.6 3.6  CL 102 100  --   CO2 23  --   --   GLUCOSE 163* 162*  --   BUN 11 12  --   CREATININE 1.05 0.90  --   CALCIUM 8.9  --   --    GFR: Estimated Creatinine Clearance: -7.4 mL/min (by C-G formula based on SCr of 0.9 mg/dL). Recent Labs  Lab 09/19/2022 0900  WBC 70.5*    Liver Function Tests: Recent Labs  Lab 09-19-2022 0900  AST 64*  ALT 49*  ALKPHOS 74  BILITOT 0.9  PROT 7.6  ALBUMIN 3.9   No results for input(s): "LIPASE", "AMYLASE" in the last 168 hours. No results for input(s): "AMMONIA" in the last 168 hours.  ABG    Component Value Date/Time   PHART 7.293 (L) 09/19/2022 1122   PCO2ART 54.5 (H) 2022-09-19 1122   PO2ART 63 (L) 09-19-2022 1122   HCO3 26.4 09-19-22 1122   TCO2 28 2022-09-19 1122   ACIDBASEDEF 1.0 2022/09/19 1122   O2SAT 89 09-19-22 1122     Coagulation Profile: Recent Labs  Lab 09-19-22 0900  INR 1.1    Cardiac Enzymes: No results for input(s): "CKTOTAL", "CKMB", "CKMBINDEX", "TROPONINI" in the last 168 hours.  HbA1C: Hgb A1c MFr Bld  Date/Time Value Ref Range Status  09/19/22 12:34 PM 6.5 (H) 4.8 - 5.6 % Final    Comment:    (NOTE) Pre diabetes:          5.7%-6.4%  Diabetes:              >6.4%  Glycemic control for   <7.0% adults with diabetes     CBG: Recent Labs  Lab 09/19/2022 0900  GLUCAP 160*    Review of Systems:   Unable to obtain given pt's AMS.   Past Medical History:  He  has no past medical history on file.   Surgical History:  History reviewed. No pertinent surgical history.   Social History:     Patient is reportedly homeless.  Family  History:  His family history is not on file.   Allergies Not on File   Home Medications  Prior to Admission medications   Not on File     Critical care time: 35 mins    Beaulah Dinning, MS4

## 2022-09-01 NOTE — Anesthesia Preprocedure Evaluation (Signed)
Anesthesia Evaluation  Patient identified by MRN, date of birth, ID band Patient unresponsive    Reviewed: Unable to perform ROS - Chart review onlyPreop documentation limited or incomplete due to emergent nature of procedure.  Airway Mallampati: Intubated       Dental   Pulmonary    Pulmonary exam normal        Cardiovascular Normal cardiovascular exam     Neuro/Psych    GI/Hepatic   Endo/Other    Renal/GU      Musculoskeletal   Abdominal   Peds  Hematology   Anesthesia Other Findings   Reproductive/Obstetrics                             Lab Results  Component Value Date   WBC 70.5 (HH) 22-Sep-2022   HGB 17.0 Sep 22, 2022   HCT 50.0 2022-09-22   MCV 97.0 09/22/2022   PLT 212 2022-09-22   Lab Results  Component Value Date   CREATININE 0.90 Sep 22, 2022   BUN 12 09-22-22   NA 137 22-Sep-2022   K 3.6 22-Sep-2022   CL 100 09/22/2022   CO2 23 2022/09/22    Anesthesia Physical Anesthesia Plan  ASA: 4  Anesthesia Plan: General   Post-op Pain Management:    Induction: Inhalational  PONV Risk Score and Plan: 2 and Dexamethasone, Ondansetron and Treatment may vary due to age or medical condition  Airway Management Planned: Oral ETT  Additional Equipment: Arterial line  Intra-op Plan:   Post-operative Plan: Post-operative intubation/ventilation  Informed Consent: I have reviewed the patients History and Physical, chart, labs and discussed the procedure including the risks, benefits and alternatives for the proposed anesthesia with the patient or authorized representative who has indicated his/her understanding and acceptance.     Only emergency history available  Plan Discussed with:   Anesthesia Plan Comments:         Anesthesia Quick Evaluation

## 2022-09-01 NOTE — Transfer of Care (Signed)
Immediate Anesthesia Transfer of Care Note  Patient: Dustin Jones  Procedure(s) Performed: IR WITH ANESTHESIA  Patient Location: NICU  Anesthesia Type:General  Level of Consciousness: Patient remains intubated per anesthesia plan  Airway & Oxygen Therapy: Patient placed on Ventilator (see vital sign flow sheet for setting)  Post-op Assessment: Report given to RN and Post -op Vital signs reviewed and stable  Post vital signs: Reviewed and stable  Last Vitals:  Vitals Value Taken Time  BP 139/95 08-Sep-2022 2330  Temp 36.7 C 2022-09-08 2330  Pulse 65 Sep 08, 2022 2330  Resp 14 September 08, 2022 2330  SpO2 88 % 09/08/22 2330  Vitals shown include unvalidated device data.  Last Pain:  Vitals:   09-08-22 2000  TempSrc: Bladder         Complications: No notable events documented.

## 2022-09-01 NOTE — Plan of Care (Signed)
  Problem: Clinical Measurements: Goal: Cardiovascular complication will be avoided Outcome: Progressing   Problem: Activity: Goal: Risk for activity intolerance will decrease Outcome: Progressing   Problem: Elimination: Goal: Will not experience complications related to urinary retention Outcome: Progressing   Problem: Pain Managment: Goal: General experience of comfort will improve Outcome: Progressing   Problem: Safety: Goal: Ability to remain free from injury will improve Outcome: Progressing   Problem: Skin Integrity: Goal: Risk for impaired skin integrity will decrease Outcome: Progressing

## 2022-09-01 NOTE — Anesthesia Procedure Notes (Signed)
Arterial Line Insertion Start/End10/01/2022 10:01 PM, 09/05/22 10:03 PM Performed by: Suzette Battiest, MD, Reece Agar, CRNA  Patient location: Pre-op. Emergency situation Left, radial was placed Catheter size: 20 G Hand hygiene performed , maximum sterile barriers used  and Seldinger technique used Allen's test indicative of satisfactory collateral circulation Attempts: 1 Procedure performed without using ultrasound guided technique. Following insertion, dressing applied and Biopatch. Post procedure assessment: normal and unchanged  Patient tolerated the procedure well with no immediate complications.

## 2022-09-01 NOTE — Consult Note (Signed)
NEUROLOGY CONSULTATION NOTE   Date of service: 09/11/22 Patient Name: Amadeo Garnet MRN:  [id number] DOB:  12/05/1875 Reason for consult: "Stroke code for L sided weakness, R gaze deviation" Requesting Provider: No att. providers found _ _ _   _ __   _ __ _ _  __ __   _ __   __ _  History of Present Illness  [place] Doe is a 61 y.o. male with unknown PMH significant who was last seen walking at 0330 and then found down face down on pavement. Initially evaluated by PD who called EMS. He was brought in as a stroke code for L sided weakness, poor responsiveness and R gaze. He was in C collar with dried blood in his mouth and R eye swollen.  Glucose per EMS was normal, he was significantly hypertensive to 190s systolic. He was taken to a room for intubation and then taken to CT scan for stroke code imaging.  LKW: 0330 mRS: unknown tNKASE: not offered, he has ICH Thrombectomy: not offered, he has ICH ICH score: ~30 cc, GCS 11(E2, V4, M5) and IVH NIHSS components Score: Comment  1a Level of Conscious 0[]  1[]  2[x]  3[]      1b LOC Questions 0[]  1[]  2[x]       1c LOC Commands 0[]  1[x]  2[]       2 Best Gaze 0[]  1[]  2[x]       3 Visual 0[x]  1[]  2[]  3[]      4 Facial Palsy 0[]  1[]  2[x]  3[]      5a Motor Arm - left 0[x]  1[]  2[]  3[]  4[]  UN[]    5b Motor Arm - Right 0[]  1[]  2[]  3[]  4[x]  UN[]    6a Motor Leg - Left 0[]  1[]  2[x]  3[]  4[]  UN[]    6b Motor Leg - Right 0[]  1[]  2[]  3[x]  4[]  UN[]    7 Limb Ataxia 0[x]  1[]  2[]  3[]  UN[]     8 Sensory 0[]  1[]  2[x]  UN[]      9 Best Language 0[]  1[x]  2[]  3[]      10 Dysarthria 0[]  1[]  2[x]  UN[]      11 Extinct. and Inattention 0[x]  1[]  2[]     Hard to assess  TOTAL: 23       ROS  Unable to get ROS given the acuity of the situation. Past History  No past medical history on file.  No family history on file. Social History   Socioeconomic History   Marital status: Unknown    Spouse name: Not on file   Number of children: Not on file   Years of  education: Not on file   Highest education level: Not on file  Occupational History   Not on file  Tobacco Use   Smoking status: Not on file   Smokeless tobacco: Not on file  Substance and Sexual Activity   Alcohol use: Not on file   Drug use: Not on file   Sexual activity: Not on file  Other Topics Concern   Not on file  Social History Narrative   Not on file   Social Determinants of Health   Financial Resource Strain: Not on file  Food Insecurity: Not on file  Transportation Needs: Not on file  Physical Activity: Not on file  Stress: Not on file  Social Connections: Not on file   Not on File  Medications  (Not in a hospital admission)    Vitals   There were no vitals filed for this visit.   There is no height or weight on  file to calculate BMI.  Physical Exam   General: Laying comfortably in bed; somnolent HENT: dried blood inmouth. Normal external appearance of ears and nose. Neck: In C collar. CV: No JVD. No peripheral edema. Pulmonary: Symmetric Chest rise. Normal respiratory effort. Abdomen: round, seems to have hernia, non tender. Ext: No cyanosis, edema, or deformity Skin: No rash. Normal palpation of skin. Musculoskeletal: Normal digits and nails by inspection. No clubbing.  Neurologic Examination  Mental status/Cognition: somnolent, partially opens eyes to vigorous tactile stim. Oriented to self, does not answer any more orientation questions. Sonorous respirations. Speech/language: severely dysarthric, answers his name. Not able to get much and exam had to be brief given concern for airway. Cranial nerves:   CN II L pupil round and reactive. Hard to visualize R pupil given signifcant eyelid edema.   CN III,IV,VI Gaze deviation to the right, does not cross midline.   CN V Corneals positive BL   CN VII ?L facial droop, hard to assess given he is in C collar.   CN VIII Opens eyes to loucd voice.   CN IX & X Unable to assess. Concerns for airway given  sonrous respirations.   CN XI Unable to assess given he is in C collar.   CN XII midline tongue   Motor:  Muscle bulk: normal, tone flaccid in LUE. LUE: no movement RUE: holdsoff the bed with no drift. LLE: minimal movement to pinch RLE: spontaneously moves it at the knee. Does seem to withdraw.  Sensation:  Light touch Localizes in RUE, withdraws in RLE. Minimal movement in LLE to pinch, no response to pinch in LUE.   Pin prick    Temperature    Vibration   Proprioception    Coordination/Complex Motor:  Unable to assess.  Labs   CBC: No results for input(s): "WBC", "NEUTROABS", "HGB", "HCT", "MCV", "PLT" in the last 168 hours.  Basic Metabolic Panel: No results found for: "NA", "K", "CO2", "GLUCOSE", "BUN", "CREATININE", "CALCIUM", "GFRNONAA", "GFRAA" Lipid Panel: No results found for: "LDLCALC" HgbA1c: No results found for: "HGBA1C" Urine Drug Screen: No results found for: "LABOPIA", "COCAINSCRNUR", "LABBENZ", "AMPHETMU", "THCU", "LABBARB"  Alcohol Level No results found for: "ETH"  CT Head without contrast(Personally reviewed): 1. Large acute hemorrhage in the right lentiform nucleus with intraventricular extension. Approximately 7 mm of leftward midline shift. 2. Slight rounding of the temporal horns, which could represent early hydrocephalus. Recommend attention on follow-up.  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 male with unknown hx p/w large ICH in the R lentiform nucleus with intraventricular extension and ? Early hydrocephalus. Etiology likely aneurysmal rupture in the setting of hypertensive emergency.  Recommendations   Large acute hemorrhage in the right lentiform nucleus with intraventricular extension with brain compression with midline shift and ICH score of 3 and probable developing hydrocephalus. 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 - Admit to ICU - Stability scan in 6 hours or STAT with any neurological decline - Frequent neuro checks; q42min for 1 hour, then q1hour - No antiplatelets or anticoagulants due to ICH - SCD for DVT prophylaxis, pharmacological DVT ppx at 24 hours if ICH is stable - Blood pressure control with goal systolic 130 - 150, cleverplex and labetalol GTT. - Stroke labs, HgbA1c, fasting lipid panel - MRI brain with and without contrast when stabilized to evaluate for underlying mass -  CT angio head and neck evaluate for spot sign or underlying vascular abnormality. - Risk factor modification - Echocardiogram - PT consult, OT consult, Speech consult. - Given aneurysmal rupture and bleed, neurosurgery to admit. We will signoff.  Hypertensive Emergency: - Goal SBP as above. BP was as high as 563S systolic in the ED.  Presumed full code, unable to verify allergies, no family or next of kin identified at this time.     This patient is critically ill and at significant risk of neurological worsening, death and care requires constant monitoring of vital signs, hemodynamics,respiratory and cardiac monitoring, neurological assessment, discussion with family, other specialists and medical decision making of high complexity. I spent 50 minutes of neurocritical care time  in the care of  this patient. This was time spent independent of any time provided by nurse practitioner or PA.  Donnetta Simpers [facility] Pager Number [phone] 2022/09/14  9:41 AM  ______________________________________________________________________   Thank you for the opportunity to take part in the care of this patient. If you have any further questions, please contact the neurology consultation attending.  Signed,  [place] Pager Number [phone] _ _ _   _ __   _ __ _ _  __ __   _ __   __ _

## 2022-09-01 NOTE — Progress Notes (Addendum)
eLink Physician-Brief Progress Note Patient Name: [name] DOB: 12/05/1875 MRN: [id number]   Date of Service  10-01-2022  HPI/Events of Note  Hypoxia - pO2 = 58 on ABG with sat = 8+ likely d/t de-recruitment when bagged w/o PEEP in IR as patient was on PEEP = 10 prior to IR. FiO2 increased to 100% and PEEP increased to 12 by RT and SAT = 93%. Now on 100%/PRVC 22/TV 520/P 12.   eICU Interventions  Continue present ventilator management     Intervention Category Major Interventions: Hypoxemia - evaluation and management;Respiratory failure - evaluation and management  Lysle Dingwall 10/01/22, 11:54 PM

## 2022-09-01 NOTE — ED Triage Notes (Signed)
Pt BIB by [facility]. Pt found on ground per [facility]. Phone numbers for [facility] [phone] and [phone]. Pt last known well 0330 on 09/28/22. History limited. Pt lethargic and not responsive upon arrival. Pt in c-collar placed via EMS.  Pt placed in room 35 for RSI.

## 2022-09-01 NOTE — ED Notes (Signed)
Neurosurgeon at bedside at this time. Discussed pt care and clinical status. Pt to go for procedure later tonight 7/8.

## 2022-09-01 NOTE — Brief Op Note (Signed)
  NEUROSURGERY BRIEF OPERATIVE  NOTE   PREOP DX: Right Intraparenchymal hemorrhage, RICA aneurysm  POSTOP DX: Same  PROCEDURE: Diagnostic cerebral angiogram, Coil embolization of RICA terminus aneurysm  SURGEON: Dr. Consuella Lose, MD  ANESTHESIA: GETA  EBL: Minimal  SPECIMENS: None  COMPLICATIONS: None  CONDITION: Stable to recovery  FINDINGS (Full report in [facility]): 1. Successful coil embolization of RICA terminus aneurysm as the likely source of hemorrhage   Consuella Lose, MD [facility]

## 2022-09-01 NOTE — ED Provider Notes (Signed)
[facility] EMERGENCY DEPARTMENT Provider Note   CSN: [id number] Arrival date & time: 09/07/2022  [id number]  An emergency department physician performed an initial assessment on this suspected stroke patient at 0915 (post intubation).  History  Chief Complaint  Patient presents with   Code Stroke    [place] Doe is a 61 y.o. male.  Pt presenting today as a Doe as code stroke.  Pt is homeless and was last seen by people where he stays at 3am walking around.  They found him this morning around 8am face down and called 911.  Paramedics noted LVO positive with left sided deficits, high blood pressure and AMS.  BS wnl.  Pt transferred here for further care.  The history is provided by the EMS personnel ([facility]standard where he stays).       Home Medications Prior to Admission medications   Not on File      Allergies    Patient has no allergy information on record.    Review of Systems   Review of Systems  Physical Exam Updated Vital Signs BP (!) 171/93   Pulse 87   Resp 19   SpO2 91%  Physical Exam Vitals and nursing note reviewed.  Constitutional:      General: He is not in acute distress.    Appearance: He is well-developed. He is ill-appearing.     Comments: Obtunded and but will occasionally try to say something  HENT:     Head: Normocephalic and atraumatic.     Mouth/Throat:     Comments: Dried blood in the mouth with poor dentition but no lacerations present Eyes:     Conjunctiva/sclera: Conjunctivae normal.     Pupils: Pupils are equal, round, and reactive to light.  Cardiovascular:     Rate and Rhythm: Regular rhythm. Tachycardia present.     Heart sounds: No murmur heard. Pulmonary:     Effort: Pulmonary effort is normal. No respiratory distress.     Breath sounds: Normal breath sounds. No wheezing or rales.  Abdominal:     General: There is no distension.     Palpations: Abdomen is soft.     Tenderness: There is no abdominal tenderness.  There is no guarding or rebound.  Musculoskeletal:        General: No tenderness. Normal range of motion.     Cervical back: Normal range of motion and neck supple.  Skin:    General: Skin is warm and dry.     Findings: No erythema or rash.  Neurological:     Comments: Flaccid paralysis of the left upper and lower ext.  Left sided facial droop.  Eyes deviated to the right and will go to midline but will not pass midline.  Aphasia and slurred speech  Psychiatric:     Comments: altered     ED Results / Procedures / Treatments   Labs (all labs ordered are listed, but only abnormal results are displayed) Labs Reviewed  APTT - Abnormal; Notable for the following components:      Result Value   aPTT 21 (*)    All other components within normal limits  COMPREHENSIVE METABOLIC PANEL - Abnormal; Notable for the following components:   Glucose, Bld 163 (*)    AST 64 (*)    ALT 49 (*)    GFR, Estimated 48 (*)    All other components within normal limits  I-STAT CHEM 8, ED - Abnormal; Notable for the following components:  Glucose, Bld 162 (*)    Calcium, Ion 1.01 (*)    Hemoglobin 17.3 (*)    All other components within normal limits  CBG MONITORING, ED - Abnormal; Notable for the following components:   Glucose-Capillary 160 (*)    All other components within normal limits  RESP PANEL BY RT-PCR (FLU A&B, COVID) ARPGX2  ETHANOL  PROTIME-INR  CBC  DIFFERENTIAL  RAPID URINE DRUG SCREEN, HOSP PERFORMED  URINALYSIS, ROUTINE W REFLEX MICROSCOPIC  HEMOGLOBIN A1C  LIPID PANEL    EKG EKG Interpretation  Date/Time:  Thursday 09/09/2022 09:09:04 EDT Ventricular Rate:  70 PR Interval:  148 QRS Duration: 141 QT Interval:  427 QTC Calculation: 461 R Axis:   61 Text Interpretation: Sinus arrhythmia Nonspecific intraventricular conduction delay Confirmed by Gwyneth Sprout ([id number]) on September 09, 2022 9:39:47 AM  Radiology DG Chest Port 1 View  Result Date: Sep 09, 2022 CLINICAL  DATA:  Male of unknown age code stroke presentation. Right lentiform hemorrhage with intraventricular extension on head CT. Intubated. Blunt trauma. EXAM: PORTABLE CHEST 1 VIEW COMPARISON:  CTA head and neck today reported separately. FINDINGS: Portable AP semi upright view at 0948 hours. Satisfactory endotracheal tube tip between the clavicles and carina. Enteric tube courses to the abdomen and appears to loop in the stomach, left upper quadrant. Low lung volumes. Mediastinal contours are within normal limits. Patchy and confluent left lung, perihilar opacity. Dependent right upper lung opacity better demonstrated by CTA today. No pneumothorax or definite effusion. Paucity of bowel gas in the upper abdomen. No acute osseous abnormality identified. IMPRESSION: 1. Satisfactory ET tube and enteric tube. 2. Patchy and confluent left lung opacity, with dependent right upper lung opacity better demonstrated by CTA today. Consider aspiration and/or pneumonia. No pneumothorax or definite effusion. Electronically Signed   By: Odessa Fleming M.D.   On: Sep 09, 2022 10:03   CT C-SPINE NO CHARGE  Result Date: 2022/09/09 CLINICAL DATA:  Male of unknown age code stroke presentation. Right lentiform hemorrhage with intraventricular extension on head CT. Intubated. Blunt trauma. EXAM: CT CERVICAL SPINE WITH CONTRAST TECHNIQUE: Multiplanar CT images of the cervical spine were reconstructed from contemporary CTA of the Neck. RADIATION DOSE REDUCTION: This exam was performed according to the departmental dose-optimization program which includes automated exposure control, adjustment of the mA and/or kV according to patient size and/or use of iterative reconstruction technique. CONTRAST:  No additional COMPARISON:  CT Head, CT face, and CTA Head and Neck today. FINDINGS: Alignment: Reversal of cervical lordosis. Cervicothoracic junction alignment is within normal limits. Bilateral posterior element alignment is within normal limits. Skull  base and vertebrae: Visualized skull base is intact. No atlanto-occipital dissociation. C1 and C2 appear intact and aligned. No acute osseous abnormality identified. Soft tissues and spinal canal: No prevertebral fluid or swelling. No visible canal hematoma. Note bilateral cervical lymphadenopathy, but other face and neck soft tissues are reported separately Disc levels: Congenital incomplete segmentation of C3-C4. Chronic severe disc and endplate degeneration C5-C6 through T1-T2. Associated bulky anterior endplate osteophytes, with subsequent interbody ankylosis of C7-T1. Possible developing ankylosis at C6-C7. Mild associated lower cervical spinal stenosis suspected. Upper chest: Reported separately. CT Head and CTA Head and Neck are reported separately. IMPRESSION: 1. No acute traumatic injury identified in the cervical spine. 2. CT Head and CTA Head and Neck are reported separately. 3. Nonspecific cervical Lymphadenopathy, see Face CT reported separately. 4. 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  C7-T1 ankylosis. Mild lower cervical spinal stenosis suspected. Electronically Signed   By: Odessa Fleming M.D.   On: 09/26/2022 10:01   CT MAXILLOFACIAL W CONTRAST  Result Date: 09-26-2022 CLINICAL DATA:  Male of unknown age code stroke presentation. Right lentiform hemorrhage with intraventricular extension on head CT. Intubated. Blunt trauma. EXAM: CT MAXILLOFACIAL WITH CONTRAST TECHNIQUE: Multidetector CT imaging of the maxillofacial structures was performed with intravenous contrast. Multiplanar CT image reconstructions were also generated. RADIATION DOSE REDUCTION: This exam was performed according to the departmental dose-optimization program which includes automated exposure control, adjustment of the mA and/or kV according to patient size and/or use of iterative reconstruction technique.  CONTRAST:  75mL OMNIPAQUE IOHEXOL 350 MG/ML SOLN COMPARISON:  Head CT, cervical spine CT, CTA head and neck today reported separately. FINDINGS: Osseous: Widespread carious dentition. Mandible intact. TMJ alignment within normal limits. No acute maxilla, zygoma, pterygoid, or nasal bone fracture. Central skull base appears intact. Cervical spine is detailed separately. Orbits: Intact orbital walls. Orbits soft tissues appear symmetric and negative. Sinuses: Paranasal sinuses and mastoids are well aerated. Tympanic cavities are clear. Soft tissues: Intubated. Fluid in the pharynx. Nonspecific adenoid hypertrophy. Retropharyngeal course of the carotid arteries. Parapharyngeal, sublingual, submandibular, masticator, and parotid spaces appear normal. Nonspecific bilateral cervical lymphadenopathy, with individual nodes up to 18 mm short axis (frequent bilateral cervical nodes of 14-15 mm short axis). No cystic or necrotic nodes. Major vascular structures are reported with the CTA separately. Limited intracranial: Reported separately. IMPRESSION: 1. No acute traumatic injury identified in the Face. 2. Nonspecific bilateral cervical lymphadenopathy. Consider an acute Lymphoproliferative disorder. HIV can have a similar appearance. Metastatic disease felt less likely. 3. Poor dentition. 4. CT Head and CTA Head and Neck are reported separately. Electronically Signed   By: Odessa Fleming M.D.   On: 26-Sep-2022 09:57   CT ANGIO HEAD NECK W WO CM (CODE STROKE)  Result Date: September 26, 2022 CLINICAL DATA:  Neuro deficit, acute, stroke suspected EXAM: CT ANGIOGRAPHY HEAD AND NECK TECHNIQUE: Multidetector CT imaging of the head and neck was performed using the standard protocol during bolus administration of intravenous contrast. Multiplanar CT image reconstructions and MIPs were obtained to evaluate the vascular anatomy. Carotid stenosis measurements (when applicable) are obtained utilizing NASCET criteria, using the distal internal  carotid diameter as the denominator. RADIATION DOSE REDUCTION: This exam was performed according to the departmental dose-optimization program which includes automated exposure control, adjustment of the mA and/or kV according to patient size and/or use of iterative reconstruction technique. COMPARISON:  None Available. FINDINGS: CTA NECK FINDINGS Aortic arch: Great vessel origins are patent. Right carotid system: Limited assessment due to poor contrast bolus timing with out evidence of greater than 50% stenosis. Left carotid system: Limited assessment due to poor contrast bolus timing with out evidence of greater than 50% stenosis. Vertebral arteries: Very limited evaluation due to poor contrast bolus timing. Vertebral arteries appear patent. Skeleton: Multilevel degenerative change. Other neck: No acute findings. Upper chest: Opacities in bilateral upper lobes. Review of the MIP images confirms the above findings CTA HEAD FINDINGS Anterior circulation: Lobulated 5 mm outpouching arising from the supraclinoid right ICA (series 10, image 103 and series 9, image 98 Bilateral intracranial ICAs, MCAs and ACAs are patent without visible high-grade stenosis. Parotid mild-to-moderate bilateral intracranial ICA stenosis. Posterior circulation: Bilateral intradural vertebral arteries, basilar artery and posterior cerebral arteries are patent. Limited assessment for stenosis due to poor contrast bolus timing. Venous sinuses: As permitted by contrast timing, patent. Review of  the MIP images confirms the above findings IMPRESSION: 1. Limited study due to poor contrast bolus timing. There appears to be a lobulated 5 mm outpouching arising from the supraclinoid right ICA, adjacent to the acute intraparenchymal hemorrhage and suspicious for ruptured aneurysm. Catheter arteriogram could better assess. 2. Dependent opacities in bilateral upper lobes, probably aspiration. Findings discussed with Dr. Derry Lory via telephone 9:51  a.m. Electronically Signed   By: Feliberto Harts M.D.   On: 09/27/2022 09:55   CT HEAD CODE STROKE WO CONTRAST  Result Date: 09/27/2022 CLINICAL DATA:  Code stroke.  Neuro deficit, acute, stroke suspected EXAM: CT HEAD WITHOUT CONTRAST TECHNIQUE: Contiguous axial images were obtained from the base of the skull through the vertex without intravenous contrast. RADIATION DOSE REDUCTION: This exam was performed according to the departmental dose-optimization program which includes automated exposure control, adjustment of the mA and/or kV according to patient size and/or use of iterative reconstruction technique. COMPARISON:  None Available. FINDINGS: Brain: Large acute hemorrhage in the right lentiform nucleus with intraventricular extension to the lateral ventricles, third ventricle, and fourth ventricle. Acute hemorrhage measures up to approximately 7.0 x 2.3 x 3.6 cm (estimated volume of 29 mL). There may be a small amount of subdural hemorrhage along the falx. Slight rounding of the temporal horns. Approximately 7 mm of leftward midline shift at the foramen of [place]ro. Mild effacement of the suprasellar cistern and sulcal effacement. No evidence of acute large vascular territory infarct or mass lesion. Vascular: No hyperdense vessel identified. Skull: No acute fracture. Sinuses/Orbits: Clear visualized sinuses. No acute orbital findings. IMPRESSION: 1. Large acute hemorrhage in the right lentiform nucleus with intraventricular extension. Approximately 7 mm of leftward midline shift. 2. Slight rounding of the temporal horns, which could represent early hydrocephalus. Recommend attention on follow-up. Findings discussed with Dr. Erasmo Downer via telephone at 9:30 a.m. Electronically Signed   By: Feliberto Harts M.D.   On: 09-27-2022 09:33    Procedures Procedure Name: Intubation Date/Time: September 27, 2022 9:39 AM  Performed by: Gwyneth Sprout, MDPre-anesthesia Checklist: Patient identified, Suction available  and Patient being monitored Oxygen Delivery Method: Ambu bag Preoxygenation: Pre-oxygenation with 100% oxygen Induction Type: Rapid sequence Ventilation: Mask ventilation without difficulty Laryngoscope Size: 4 and Glidescope Grade View: Grade IV Tube size: 8.0 mm Number of attempts: 1 Airway Equipment and Method: Video-laryngoscopy Placement Confirmation: ETT inserted through vocal cords under direct vision, Positive ETCO2, CO2 detector and Breath sounds checked- equal and bilateral Secured at: 24 cm Tube secured with: ETT holder Dental Injury: Teeth and Oropharynx as per pre-operative assessment  Difficulty Due To: Difficult Airway- due to limited oral opening and Difficult Airway- due to dentition        Medications Ordered in ED Medications   stroke: early stages of recovery book (has no administration in time range)  acetaminophen (TYLENOL) tablet 650 mg (has no administration in time range)    Or  acetaminophen (TYLENOL) 160 MG/5ML solution 650 mg (has no administration in time range)    Or  acetaminophen (TYLENOL) suppository 650 mg (has no administration in time range)  senna-docusate (Senokot-S) tablet 1 tablet (has no administration in time range)  pantoprazole (PROTONIX) injection 40 mg (has no administration in time range)  clevidipine (CLEVIPREX) infusion 0.5 mg/mL (32 mg/hr Intravenous Rate/Dose Change September 27, 2022 0955)  etomidate (AMIDATE) injection (20 mg Intravenous Given 2022/09/27 0912)  rocuronium (ZEMURON) injection (90 mg Intravenous Given 09-27-22 0912)  labetalol (NORMODYNE) infusion 5 mg/mL (has no administration in time range)  iohexol (OMNIPAQUE) 350  MG/ML injection 100 mL (75 mLs Intravenous Contrast Given 08/19/2022 0947)    ED Course/ Medical Decision Making/ A&P                           Medical Decision Making Amount and/or Complexity of Data Reviewed Labs: ordered. Radiology: ordered.  Risk Prescription drug management. Decision regarding  hospitalization.   Pt with multiple medical problems and comorbidities and presenting today with a complaint that caries a high risk for morbidity and mortality.  Here today after being found down altered with left-sided deficits.  Because he was LVO positive he was called a code stroke in route by paramedics.  Upon arrival here there was question about patient protecting his airway as he was significantly altered, aphasic and mental status was waxing and waning.  The decision was made to intubate the patient for airway protection prior to going to scanner.  Concern for intracranial hemorrhage, versus subarachnoid hemorrhage from aneurysm bleed, traumatic bleed versus ischemic stroke.  Also concern for possible alcohol intoxication, metabolic encephalopathy.  Patient also was noted to have a blood pressure of 269/141.  He was started on propofol after intubation which was uncomplicated.  Breath sounds were equal bilaterally and good visualization of the tube going through the cords.  In the scanner patient did become hypoxic most likely related to body habitus and positioning.  With increased PEEP oxygen saturation did improve.  Patient's blood pressure remained elevated with propofol and he was started on Cleviprex. I have independently visualized and interpreted pt's images today.  CT of the head showed a large intracranial hemorrhage.  CT of the cervical spine and face were also done because patient was found down on his face.  CTA showed a lobulated 5 mm outpouching arising from the supraclinoid right ICA concerning for aneurysm rupture.  Chest x-ray did show significant opacity and infiltrates on the left concerning for possible aspiration.  CT of the cervical spine and face are negative for acute fracture.  After Cleviprex was maxed out patient is still hypertensive and was started on labetalol drip.  Dr. Randal BubaKhalquidina also paged with neuro surgery IR for possible aneurysm evaluation and clipping.  Patient  remained on elevated PEEP due to concern for aspiration pneumonia.  I independently interpreted patient's EKG and labs today.  EKG without acute findings, Chem-8 with stable creatinine of 1.9, normal INR, CBC with a white count pending but otherwise normal hemoglobin and platelets.  Patient will need admission to the ICU.  Possible IR procedure.  He remains critically ill.  CRITICAL CARE Performed by: Romano Stigger Total critical care time: 45 minutes Critical care time was exclusive of separately billable procedures and treating other patients. Critical care was necessary to treat or prevent imminent or life-threatening deterioration. Critical care was time spent personally by me on the following activities: development of treatment plan with patient and/or surrogate as well as nursing, discussions with consultants, evaluation of patient's response to treatment, examination of patient, obtaining history from patient or surrogate, ordering and performing treatments and interventions, ordering and review of laboratory studies, ordering and review of radiographic studies, pulse oximetry and re-evaluation of patient's condition.           Final Clinical Impression(s) / ED Diagnoses Final diagnoses:  Intracranial hemorrhage (HCC)  Acute respiratory failure with hypoxia (HCC)  Aspiration pneumonia of both lungs, unspecified aspiration pneumonia type, unspecified part of lung (HCC)    Rx / DC Orders ED  Discharge Orders     None         Blanchie Dessert, MD 09/22/22 1016

## 2022-09-01 NOTE — Progress Notes (Signed)
Pt was transported to [facility] from ED with no apparent complications. [facility] RT given report.

## 2022-09-01 NOTE — H&P (Deleted)
NEUROLOGY CONSULTATION NOTE   Date of service: September 07, 2022 Patient Name: Dustin Jones MRN:  [id number] DOB:  12/05/1875 Reason for consult: "Stroke code for L sided weakness, R gaze deviation" Requesting Provider: No att. providers found _ _ _   _ __   _ __ _ _  __ __   _ __   __ _  History of Present Illness  Dustin Jones is a 61 y.o. male with unknown PMH significant who was last seen walking at 0330 and then found down face down on pavement. Initially evaluated by PD who called EMS. He was brought in as a stroke code for L sided weakness, poor responsiveness and R gaze. He was in C collar with dried blood in his mouth and R eye swollen.  Glucose per EMS was normal, he was significantly hypertensive to 190s systolic. He was taken to a room for intubation and then taken to CT scan for stroke code imaging.  LKW: 0330 mRS: unknown tNKASE: not offered, he has ICH Thrombectomy: not offered, he has ICH ICH score: ~30 cc, GCS 11(E2, V4, M5) and IVH NIHSS components Score: Comment  1a Level of Conscious 0[]  1[]  2[x]  3[]      1b LOC Questions 0[]  1[]  2[x]       1c LOC Commands 0[]  1[x]  2[]       2 Best Gaze 0[]  1[]  2[x]       3 Visual 0[x]  1[]  2[]  3[]      4 Facial Palsy 0[]  1[]  2[x]  3[]      5a Motor Arm - left 0[x]  1[]  2[]  3[]  4[]  UN[]    5b Motor Arm - Right 0[]  1[]  2[]  3[]  4[x]  UN[]    6a Motor Leg - Left 0[]  1[]  2[x]  3[]  4[]  UN[]    6b Motor Leg - Right 0[]  1[]  2[]  3[x]  4[]  UN[]    7 Limb Ataxia 0[x]  1[]  2[]  3[]  UN[]     8 Sensory 0[]  1[]  2[x]  UN[]      9 Best Language 0[]  1[x]  2[]  3[]      10 Dysarthria 0[]  1[]  2[x]  UN[]      11 Extinct. and Inattention 0[x]  1[]  2[]     Hard to assess  TOTAL: 23       ROS  Unable to get ROS given the acuity of the situation. Past History  No past medical history on file.  No family history on file. Social History   Socioeconomic History   Marital status: Unknown    Spouse name: Not on file   Number of children: Not on file   Years of  education: Not on file   Highest education level: Not on file  Occupational History   Not on file  Tobacco Use   Smoking status: Not on file   Smokeless tobacco: Not on file  Substance and Sexual Activity   Alcohol use: Not on file   Drug use: Not on file   Sexual activity: Not on file  Other Topics Concern   Not on file  Social History Narrative   Not on file   Social Determinants of Health   Financial Resource Strain: Not on file  Food Insecurity: Not on file  Transportation Needs: Not on file  Physical Activity: Not on file  Stress: Not on file  Social Connections: Not on file   Not on File  Medications  (Not in a hospital admission)    Vitals   There were no vitals filed for this visit.   There is no height or weight on  file to calculate BMI.  Physical Exam   General: Laying comfortably in bed; somnolent HENT: dried blood inmouth. Normal external appearance of ears and nose. Neck: In C collar. CV: No JVD. No peripheral edema. Pulmonary: Symmetric Chest rise. Normal respiratory effort. Abdomen: round, seems to have hernia, non tender. Ext: No cyanosis, edema, or deformity Skin: No rash. Normal palpation of skin. Musculoskeletal: Normal digits and nails by inspection. No clubbing.  Neurologic Examination  Mental status/Cognition: somnolent, partially opens eyes to vigorous tactile stim. Oriented to self, does not answer any more orientation questions. Sonorous respirations. Speech/language: severely dysarthric, answers his name. Not able to get much and exam had to be brief given concern for airway. Cranial nerves:   CN II L pupil round and reactive. Hard to visualize R pupil given signifcant eyelid edema.   CN III,IV,VI Gaze deviation to the right, does not cross midline.   CN V Corneals positive BL   CN VII ?L facial droop, hard to assess given he is in C collar.   CN VIII Opens eyes to loucd voice.   CN IX & X Unable to assess. Concerns for airway given  sonrous respirations.   CN XI Unable to assess given he is in C collar.   CN XII midline tongue   Motor:  Muscle bulk: normal, tone flaccid in LUE. LUE: no movement RUE: holdsoff the bed with no drift. LLE: minimal movement to pinch RLE: spontaneously moves it at the knee. Does seem to withdraw.  Sensation:  Light touch Localizes in RUE, withdraws in RLE. Minimal movement in LLE to pinch, no response to pinch in LUE.   Pin prick    Temperature    Vibration   Proprioception    Coordination/Complex Motor:  Unable to assess.  Labs   CBC: No results for input(s): "WBC", "NEUTROABS", "HGB", "HCT", "MCV", "PLT" in the last 168 hours.  Basic Metabolic Panel: No results found for: "NA", "K", "CO2", "GLUCOSE", "BUN", "CREATININE", "CALCIUM", "GFRNONAA", "GFRAA" Lipid Panel: No results found for: "LDLCALC" HgbA1c: No results found for: "HGBA1C" Urine Drug Screen: No results found for: "LABOPIA", "COCAINSCRNUR", "LABBENZ", "AMPHETMU", "THCU", "LABBARB"  Alcohol Level No results found for: "ETH"  CT Head without contrast(Personally reviewed): 1. Large acute hemorrhage in the right lentiform nucleus with intraventricular extension. Approximately 7 mm of leftward midline shift. 2. Slight rounding of the temporal horns, which could represent early hydrocephalus. Recommend attention on follow-up.  CT angio Head and Neck with contrast: pending  MRI Brain: pending  Impression   Dustin Jones is a 61 y.o. male with unknown hx p/w large ICH in the R lentiform nucleus with intraventricular extension and ? Early hydrocephalus. He was intubated for concern for inability to protect his airway.  Recommendations   Large acute hemorrhage in the right lentiform nucleus with intraventricular extension with brain compression with midline shift and ICH score of 3 and probable developing hydrocephalus: - Admit to ICU - Stability scan in 6 hours or STAT with any neurological decline - Frequent  neuro checks; q9min for 1 hour, then q1hour - No antiplatelets or anticoagulants due to [place] - SCD for DVT prophylaxis, pharmacological DVT ppx at 24 hours if ICH is stable - Blood pressure control with goal systolic 557 - 322, cleverplex and labetalol PRN - Stroke labs, HgbA1c, fasting lipid panel - MRI brain with and without contrast when stabilized to evaluate for underlying mass - CT angio head and neck evaluate for spot sign or underlying vascular abnormality. -  Risk factor modification - Echocardiogram - PT consult, OT consult, Speech consult. - Stroke team to follow  Acute hypoxic respiratory failure due to above: - intubated in the ED. - consult PCCM for vent management  Hypertensive Emergency: - Goal SBP as above. BP was as high as 270s systolic in the ED.  Presumed full code, unable to verify allergies, no family or next of kin identified at this time.   This patient is critically ill and at significant risk of neurological worsening, death and care requires constant monitoring of vital signs, hemodynamics,respiratory and cardiac monitoring, neurological assessment, discussion with family, other specialists and medical decision making of high complexity. I spent 50 minutes of neurocritical care time  in the care of  this patient. This was time spent independent of any time provided by nurse practitioner or PA.  Erick Blinks [facility] Pager Number [phone] 09/21/22  9:41 AM  ______________________________________________________________________   Thank you for the opportunity to take part in the care of this patient. If you have any further questions, please contact the neurology consultation attending.  Signed,  Erick Blinks [facility] Pager Number [phone] _ _ _   _ __   _ __ _ _  __ __   _ __   __ _

## 2022-09-01 NOTE — Progress Notes (Signed)
Attempted to give report to Short Stay at [phone] but no answer.... will try again.

## 2022-09-01 NOTE — Code Documentation (Signed)
Stroke Response Nurse Documentation Code Documentation  Dustin Jones is a 61 y.o. male arriving to [facility]  via [facility] on 2022/09/25 with unknown past medical history. Code stroke was activated by EMS.   Patient is homeless and was LKW walking around at 0330 this morning. Later found down and unable to wake up.   Stroke team at the bedside on patient arrival. Labs drawn and patient taken to ED room for intubation. Delay in intubation while obtaining PIV access. Once intubated, patient cleared for CT by Dr. Maryan Rued. Patient to CT with team. NIHSS 28, see documentation for details and code stroke times. Patient with decreased LOC, disoriented, not following commands, right gaze preference , left hemianopia, left facial droop, left arm weakness, bilateral leg weakness, left decreased sensation, Global aphasia , dysarthria , and Sensory  neglect on exam. The following imaging was completed:  CT Head and CTA. Patient is not a candidate for IV Thrombolytic due to [place]. Labetalol given and cleviprex gtt started. Titrated to max rate. Order modified to increase gtt max. Max reached again. Labetalol gtt ordered. Patient coughing intermittently. Prop gtt increased and fentanyl gtt added. Bloody copious secretions noted in mouth- suctioned. RRT and EDP aware. Fentanyl gtt added for vent agitation.   Care Plan:  Neurosurgery to admit to ICU and PCCM consulted; hourly VS and NIHSS with sbp goal 130-150.   Bedside handoff with ED RN.    Earma Reading  Stroke Response RN

## 2022-09-01 NOTE — ED Notes (Signed)
Pt transported on continuous monitoring with respiratory therapy to CT with drips infusing see MAR. CT completed. Pt belongings and security slip provided to the ICU.

## 2022-09-01 NOTE — Progress Notes (Addendum)
  NEUROSURGERY PROGRESS NOTE   History reviewed in EMR and bedside RN.  Briefly, the patient was apparently found lying facedown on the sidewalk and brought in by bystanders.  He was apparently last seen walking around at approximately 3:00 this morning, but found by bystanders at 8 AM this morning.  Emergency personnel noted left-sided weakness and code stroke was activated.  Patient required intubation for airway protection.  CT scan demonstrated a large intraparenchymal hematoma, with CT angiogram suggesting possible intracranial aneurysm although difficult to interpret due to poor contrast bolus timing.  Patient does not have any family or friends available to obtain any past history.  EXAM:  BP 109/71   Pulse 63   Temp 99 F (37.2 C)   Resp (!) 22   Ht 5\' 7"  (1.702 m)   Wt 100 kg   SpO2 98%   BMI 34.53 kg/m   Intubated, on minimal propofol Pupils 3 mm and reactive bilaterally (+) Cough/gag Withdrawal to central pain on the right No significant movement to central pain on the left  IMAGING: CT scan of the head was personally reviewed and demonstrates a large right-sided lenticulostriate intraparenchymal hematoma with a small tail down into the substantia innominata/anterior perforated substance.  The CT angiogram is difficult to interpret due to poor bolus of the contrast however there is suggestion of an anteriorly and superiorly directed aneurysm at the ICA terminus which appears to be in close proximity to the small intraparenchymal hemorrhage.   IMPRESSION:  Red Christians found down with large intraparenchymal hematoma and possibility of intracranial aneurysm as the source of the hemorrhage.  PLAN: -We will plan on admitting the patient to the intensive care unit -Continue with SBP goal less than 140 mmHg -Vent management per PCCM -We will plan on diagnostic cerebral angiogram for more definitive diagnosis of intracranial aneurysm with possibility of endovascular aneurysm  treatment  Unfortunately, the patient does not have any family or friends available to provide consent on the patient's behalf.  We will therefore plan on proceeding with the above procedure with emergency implied consent.  Consuella Lose, MD [facility]

## 2022-09-02 ENCOUNTER — Inpatient Hospital Stay (HOSPITAL_COMMUNITY): Payer: Medicaid Other

## 2022-09-02 ENCOUNTER — Encounter (HOSPITAL_COMMUNITY): Payer: Self-pay | Admitting: Neurosurgery

## 2022-09-02 DIAGNOSIS — J9601 Acute respiratory failure with hypoxia: Secondary | ICD-10-CM

## 2022-09-02 DIAGNOSIS — J690 Pneumonitis due to inhalation of food and vomit: Secondary | ICD-10-CM

## 2022-09-02 LAB — CBC
HCT: 44.3 % (ref 39.0–52.0)
Hemoglobin: 14.6 g/dL (ref 13.0–17.0)
MCH: 30.7 pg (ref 26.0–34.0)
MCHC: 33 g/dL (ref 30.0–36.0)
MCV: 93.1 fL (ref 80.0–100.0)
Platelets: 193 10*3/uL (ref 150–400)
RBC: 4.76 MIL/uL (ref 4.22–5.81)
RDW: 13.8 % (ref 11.5–15.5)
WBC: 61.8 10*3/uL (ref 4.0–10.5)
nRBC: 0 % (ref 0.0–0.2)

## 2022-09-02 LAB — GLUCOSE, CAPILLARY
Glucose-Capillary: 142 mg/dL — ABNORMAL HIGH (ref 70–99)
Glucose-Capillary: 131 mg/dL — ABNORMAL HIGH (ref 70–99)
Glucose-Capillary: 120 mg/dL — ABNORMAL HIGH (ref 70–99)
Glucose-Capillary: 138 mg/dL — ABNORMAL HIGH (ref 70–99)

## 2022-09-02 LAB — POCT I-STAT 7, (LYTES, BLD GAS, ICA,H+H)
Acid-base deficit: 3 mmol/L — ABNORMAL HIGH (ref 0.0–2.0)
Bicarbonate: 20.1 mmol/L (ref 20.0–28.0)
Calcium, Ion: 0.85 mmol/L — CL (ref 1.15–1.40)
HCT: 34 % — ABNORMAL LOW (ref 39.0–52.0)
Hemoglobin: 11.6 g/dL — ABNORMAL LOW (ref 13.0–17.0)
O2 Saturation: 100 %
Patient temperature: 99
Potassium: 3.3 mmol/L — ABNORMAL LOW (ref 3.5–5.1)
Sodium: 144 mmol/L (ref 135–145)
TCO2: 21 mmol/L — ABNORMAL LOW (ref 22–32)
pCO2 arterial: 30.1 mmHg — ABNORMAL LOW (ref 32–48)
pH, Arterial: 7.435 (ref 7.35–7.45)
pO2, Arterial: 192 mmHg — ABNORMAL HIGH (ref 83–108)

## 2022-09-02 LAB — BASIC METABOLIC PANEL
Anion gap: 9 (ref 5–15)
BUN: 26 mg/dL — ABNORMAL HIGH (ref 8–23)
CO2: 25 mmol/L (ref 22–32)
Calcium: 8.1 mg/dL — ABNORMAL LOW (ref 8.9–10.3)
Chloride: 105 mmol/L (ref 98–111)
Creatinine, Ser: 1.49 mg/dL — ABNORMAL HIGH (ref 0.61–1.24)
GFR, Estimated: 31 mL/min — ABNORMAL LOW (ref >60)
Glucose, Bld: 134 mg/dL — ABNORMAL HIGH (ref 70–99)
Potassium: 5 mmol/L (ref 3.5–5.1)
Sodium: 139 mmol/L (ref 135–145)

## 2022-09-02 LAB — PHOSPHORUS
Phosphorus: 4.5 mg/dL (ref 2.5–4.6)
Phosphorus: 5.9 mg/dL — ABNORMAL HIGH (ref 2.5–4.6)

## 2022-09-02 LAB — TRIGLYCERIDES: Triglycerides: 190 mg/dL — ABNORMAL HIGH (ref <150)

## 2022-09-02 LAB — PROCALCITONIN: Procalcitonin: 0.84 ng/mL

## 2022-09-02 LAB — MAGNESIUM
Magnesium: 2.2 mg/dL (ref 1.7–2.4)
Magnesium: 2.2 mg/dL (ref 1.7–2.4)

## 2022-09-02 MED ORDER — NIMODIPINE 30 MG PO CAPS
60.0000 mg | ORAL_CAPSULE | ORAL | Status: AC
  Administered 2022-09-05 – 2022-09-18 (×3): 60 mg via ORAL
  Filled 2022-09-02 (×30): qty 2

## 2022-09-02 MED ORDER — KETAMINE HCL 50 MG/5ML IJ SOSY
PREFILLED_SYRINGE | INTRAMUSCULAR | Status: AC
  Filled 2022-09-02: qty 5

## 2022-09-02 MED ORDER — NIMODIPINE 6 MG/ML PO SOLN
60.0000 mg | ORAL | Status: AC
  Administered 2022-09-03 – 2022-09-23 (×116): 60 mg
  Filled 2022-09-02 (×123): qty 10

## 2022-09-02 MED ORDER — PROPOFOL 1000 MG/100ML IV EMUL
0.0000 ug/kg/min | INTRAVENOUS | Status: DC
  Administered 2022-09-02: 20 ug/kg/min via INTRAVENOUS
  Administered 2022-09-02: 40 ug/kg/min via INTRAVENOUS
  Administered 2022-09-02: 20 ug/kg/min via INTRAVENOUS
  Administered 2022-09-02: 50 ug/kg/min via INTRAVENOUS
  Administered 2022-09-03: 30 ug/kg/min via INTRAVENOUS
  Administered 2022-09-03 (×2): 50 ug/kg/min via INTRAVENOUS
  Administered 2022-09-03: 20 ug/kg/min via INTRAVENOUS
  Administered 2022-09-03: 50 ug/kg/min via INTRAVENOUS
  Administered 2022-09-03: 30 ug/kg/min via INTRAVENOUS
  Administered 2022-09-03: 50 ug/kg/min via INTRAVENOUS
  Administered 2022-09-04 (×2): 40 ug/kg/min via INTRAVENOUS
  Administered 2022-09-04: 35 ug/kg/min via INTRAVENOUS
  Filled 2022-09-02 (×4): qty 100
  Filled 2022-09-02: qty 200
  Filled 2022-09-02 (×6): qty 100

## 2022-09-02 MED ORDER — VITAL HIGH PROTEIN PO LIQD: 1000.0000 mL | ORAL | Status: DC

## 2022-09-02 MED ORDER — NIMODIPINE 30 MG PO CAPS: 60.0000 mg | ORAL_CAPSULE | ORAL | Status: DC

## 2022-09-02 MED ORDER — PANTOPRAZOLE 2 MG/ML SUSPENSION
40.0000 mg | Freq: Every day | ORAL | Status: DC
  Administered 2022-09-02 – 2022-10-04 (×32): 40 mg
  Filled 2022-09-02 (×33): qty 20

## 2022-09-02 MED ORDER — FENTANYL 2500MCG IN NS 250ML (10MCG/ML) PREMIX INFUSION
50.0000 ug/h | INTRAVENOUS | Status: DC
  Administered 2022-09-02: 50 ug/h via INTRAVENOUS
  Administered 2022-09-03 (×2): 150 ug/h via INTRAVENOUS
  Administered 2022-09-04 – 2022-09-05 (×4): 200 ug/h via INTRAVENOUS
  Filled 2022-09-02 (×5): qty 250

## 2022-09-02 MED ORDER — PROSOURCE TF20 ENFIT COMPATIBL EN LIQD
60.0000 mL | Freq: Every day | ENTERAL | Status: DC
  Administered 2022-09-02 – 2022-09-05 (×4): 60 mL
  Filled 2022-09-02 (×4): qty 60

## 2022-09-02 MED ORDER — FENTANYL CITRATE PF 50 MCG/ML IJ SOSY
PREFILLED_SYRINGE | INTRAMUSCULAR | Status: AC
  Filled 2022-09-02: qty 2

## 2022-09-02 MED ORDER — ETOMIDATE 2 MG/ML IV SOLN
20.0000 mg | Freq: Once | INTRAVENOUS | Status: AC
  Administered 2022-09-02: 20 mg via INTRAVENOUS

## 2022-09-02 MED ORDER — FENTANYL BOLUS VIA INFUSION
50.0000 ug | INTRAVENOUS | Status: DC | PRN
  Administered 2022-09-04: 50 ug via INTRAVENOUS
  Administered 2022-09-05 – 2022-09-09 (×9): 100 ug via INTRAVENOUS

## 2022-09-02 MED ORDER — SODIUM CHLORIDE 0.9 % IV SOLN
3.0000 g | Freq: Four times a day (QID) | INTRAVENOUS | Status: DC
  Administered 2022-09-02 – 2022-09-04 (×8): 3 g via INTRAVENOUS
  Filled 2022-09-02 (×8): qty 8

## 2022-09-02 MED ORDER — SODIUM CHLORIDE 0.9 % IV SOLN
2.0000 g | Freq: Once | INTRAVENOUS | Status: AC
  Administered 2022-09-02: 2 g via INTRAVENOUS
  Filled 2022-09-02: qty 20

## 2022-09-02 MED ORDER — PROPOFOL 1000 MG/100ML IV EMUL
INTRAVENOUS | Status: AC
  Filled 2022-09-02: qty 100

## 2022-09-02 MED ORDER — GADOPICLENOL 0.5 MMOL/ML IV SOLN
10.0000 mL | Freq: Once | INTRAVENOUS | Status: AC | PRN
  Administered 2022-09-02: 10 mL via INTRAVENOUS

## 2022-09-02 MED ORDER — MIDAZOLAM HCL 2 MG/2ML IJ SOLN
INTRAMUSCULAR | Status: AC
  Filled 2022-09-02: qty 2

## 2022-09-02 MED ORDER — SENNOSIDES-DOCUSATE SODIUM 8.6-50 MG PO TABS: 1.0000 | ORAL_TABLET | Freq: Two times a day (BID) | ORAL | Status: DC

## 2022-09-02 MED ORDER — FENTANYL CITRATE PF 50 MCG/ML IJ SOSY
50.0000 ug | PREFILLED_SYRINGE | INTRAMUSCULAR | Status: DC | PRN
  Administered 2022-09-02: 100 ug via INTRAVENOUS
  Filled 2022-09-02: qty 2

## 2022-09-02 MED ORDER — FENTANYL CITRATE PF 50 MCG/ML IJ SOSY: 50.0000 ug | PREFILLED_SYRINGE | INTRAMUSCULAR | Status: DC | PRN

## 2022-09-02 MED ORDER — INSULIN ASPART 100 UNIT/ML IJ SOLN
0.0000 [IU] | INTRAMUSCULAR | Status: DC
  Administered 2022-09-02 – 2022-09-03 (×6): 2 [IU] via SUBCUTANEOUS
  Administered 2022-09-03 – 2022-09-04 (×3): 3 [IU] via SUBCUTANEOUS
  Administered 2022-09-04 (×5): 2 [IU] via SUBCUTANEOUS
  Administered 2022-09-04 – 2022-09-05 (×2): 3 [IU] via SUBCUTANEOUS
  Administered 2022-09-05: 5 [IU] via SUBCUTANEOUS
  Administered 2022-09-05 (×2): 3 [IU] via SUBCUTANEOUS
  Administered 2022-09-05: 5 [IU] via SUBCUTANEOUS
  Administered 2022-09-05: 3 [IU] via SUBCUTANEOUS
  Administered 2022-09-06: 2 [IU] via SUBCUTANEOUS
  Administered 2022-09-06: 5 [IU] via SUBCUTANEOUS
  Administered 2022-09-06: 3 [IU] via SUBCUTANEOUS
  Administered 2022-09-06: 2 [IU] via SUBCUTANEOUS
  Administered 2022-09-06 – 2022-09-07 (×4): 3 [IU] via SUBCUTANEOUS
  Administered 2022-09-07: 2 [IU] via SUBCUTANEOUS
  Administered 2022-09-07: 5 [IU] via SUBCUTANEOUS
  Administered 2022-09-07: 3 [IU] via SUBCUTANEOUS
  Administered 2022-09-08: 5 [IU] via SUBCUTANEOUS
  Administered 2022-09-08: 8 [IU] via SUBCUTANEOUS
  Administered 2022-09-08: 5 [IU] via SUBCUTANEOUS
  Administered 2022-09-08: 3 [IU] via SUBCUTANEOUS
  Administered 2022-09-08 (×3): 5 [IU] via SUBCUTANEOUS
  Administered 2022-09-09 (×4): 3 [IU] via SUBCUTANEOUS
  Administered 2022-09-09: 5 [IU] via SUBCUTANEOUS
  Administered 2022-09-10 (×3): 3 [IU] via SUBCUTANEOUS
  Administered 2022-09-10 (×2): 8 [IU] via SUBCUTANEOUS
  Administered 2022-09-10 (×2): 3 [IU] via SUBCUTANEOUS
  Administered 2022-09-11 (×2): 5 [IU] via SUBCUTANEOUS
  Administered 2022-09-11 (×3): 3 [IU] via SUBCUTANEOUS
  Administered 2022-09-11 – 2022-09-12 (×2): 2 [IU] via SUBCUTANEOUS
  Administered 2022-09-12 (×3): 5 [IU] via SUBCUTANEOUS
  Administered 2022-09-12 – 2022-09-13 (×4): 3 [IU] via SUBCUTANEOUS
  Administered 2022-09-13: 5 [IU] via SUBCUTANEOUS
  Administered 2022-09-13 (×2): 3 [IU] via SUBCUTANEOUS
  Administered 2022-09-14 (×3): 5 [IU] via SUBCUTANEOUS
  Administered 2022-09-14 (×2): 2 [IU] via SUBCUTANEOUS
  Administered 2022-09-15 (×3): 3 [IU] via SUBCUTANEOUS
  Administered 2022-09-16 – 2022-09-18 (×9): 2 [IU] via SUBCUTANEOUS
  Administered 2022-09-18: 3 [IU] via SUBCUTANEOUS
  Administered 2022-09-19 – 2022-09-20 (×7): 2 [IU] via SUBCUTANEOUS
  Administered 2022-09-20 (×3): 3 [IU] via SUBCUTANEOUS
  Administered 2022-09-21 – 2022-09-24 (×16): 2 [IU] via SUBCUTANEOUS
  Administered 2022-09-24 (×2): 3 [IU] via SUBCUTANEOUS
  Administered 2022-09-24 – 2022-09-25 (×5): 2 [IU] via SUBCUTANEOUS
  Administered 2022-09-25: 3 [IU] via SUBCUTANEOUS
  Administered 2022-09-25 – 2022-09-26 (×5): 2 [IU] via SUBCUTANEOUS
  Administered 2022-09-26 (×3): 3 [IU] via SUBCUTANEOUS
  Administered 2022-09-27 (×5): 2 [IU] via SUBCUTANEOUS
  Administered 2022-09-27: 3 [IU] via SUBCUTANEOUS
  Administered 2022-09-27: 2 [IU] via SUBCUTANEOUS
  Administered 2022-09-28 (×2): 3 [IU] via SUBCUTANEOUS
  Administered 2022-09-28: 2 [IU] via SUBCUTANEOUS
  Administered 2022-09-28: 3 [IU] via SUBCUTANEOUS
  Administered 2022-09-29 – 2022-10-01 (×8): 2 [IU] via SUBCUTANEOUS
  Administered 2022-10-01: 3 [IU] via SUBCUTANEOUS
  Administered 2022-10-02 – 2022-10-03 (×6): 2 [IU] via SUBCUTANEOUS
  Administered 2022-10-03: 3 [IU] via SUBCUTANEOUS
  Administered 2022-10-03 – 2022-10-18 (×31): 2 [IU] via SUBCUTANEOUS

## 2022-09-02 MED ORDER — ETOMIDATE 2 MG/ML IV SOLN
INTRAVENOUS | Status: AC
  Filled 2022-09-02: qty 20

## 2022-09-02 MED ORDER — SUCCINYLCHOLINE CHLORIDE 200 MG/10ML IV SOSY
PREFILLED_SYRINGE | INTRAVENOUS | Status: AC
  Filled 2022-09-02: qty 10

## 2022-09-02 MED ORDER — ROCURONIUM BROMIDE 10 MG/ML (PF) SYRINGE
100.0000 mg | PREFILLED_SYRINGE | Freq: Once | INTRAVENOUS | Status: AC
  Administered 2022-09-02: 100 mg via INTRAVENOUS

## 2022-09-02 MED ORDER — POLYETHYLENE GLYCOL 3350 17 G PO PACK: 17.0000 g | PACK | Freq: Every day | ORAL | Status: DC

## 2022-09-02 MED ORDER — OSMOLITE 1.5 CAL PO LIQD
1000.0000 mL | ORAL | Status: DC
  Administered 2022-09-02: 1000 mL

## 2022-09-02 MED ORDER — FENTANYL CITRATE PF 50 MCG/ML IJ SOSY
100.0000 ug | PREFILLED_SYRINGE | Freq: Once | INTRAMUSCULAR | Status: AC
  Administered 2022-09-02: 100 ug via INTRAVENOUS

## 2022-09-02 MED ORDER — DOCUSATE SODIUM 50 MG/5ML PO LIQD: 100.0000 mg | Freq: Two times a day (BID) | ORAL | Status: DC

## 2022-09-02 MED ORDER — ROCURONIUM BROMIDE 10 MG/ML (PF) SYRINGE
PREFILLED_SYRINGE | INTRAVENOUS | Status: AC
  Filled 2022-09-02: qty 10

## 2022-09-02 MED ORDER — FENTANYL CITRATE PF 50 MCG/ML IJ SOSY: 50.0000 ug | PREFILLED_SYRINGE | Freq: Once | INTRAMUSCULAR | Status: DC

## 2022-09-02 NOTE — Progress Notes (Signed)
Initial Nutrition Assessment  DOCUMENTATION CODES:   Obesity unspecified  INTERVENTION:   Initiate tube feeding via OG tube: Osmolite 1.5 at 55 ml/h (1320 ml per day) Prosource TF20 60 ml daily  Provides 2060 kcal, 102 gm protein, 1003 ml free water daily   NUTRITION DIAGNOSIS:   Inadequate oral intake related to inability to eat as evidenced by NPO status.  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   Pt discussed during ICU rounds and with RN and MD.  Pt on vent support with fiO2 of 100%  Medications reviewed and include: colace, SSI, protonix, miralax  Cleviprex @ 2 ml/hr Propofol @ 12 ml/hr provides: 316 kcal   Labs reviewed: TG: 190 A1C: 6.5 CBG: 160  16 F OG tube; per xray tube is gastric    NUTRITION - FOCUSED PHYSICAL EXAM:  Flowsheet Row Most Recent Value  Orbital Region No depletion  Upper Arm Region No depletion  Thoracic and Lumbar Region No depletion  Buccal Region No depletion  Temple Region No depletion  Clavicle Bone Region No depletion  Clavicle and Acromion Bone Region No depletion  Scapular Bone Region No depletion  Dorsal Hand No depletion  Patellar Region No depletion  Anterior Thigh Region No depletion  Posterior Calf Region No depletion  Edema (RD Assessment) None  Hair Reviewed  Eyes Reviewed  Mouth Reviewed  Skin Reviewed  Nails Reviewed       Diet Order:   Diet Order             Diet NPO time specified  Diet effective now                   EDUCATION NEEDS:   No education needs have been identified at this time  Skin:  Skin Assessment: Skin Integrity Issues: Skin Integrity Issues:: Stage  II Stage II: R leg (pretibial) - old healing ulcer  Last BM:  unknown  Height:   Ht Readings from Last 1 Encounters:  17-Sep-2022 5\' 7"  (1.702 m)    Weight:   Wt Readings from Last 1 Encounters:  09-17-22 100 kg    BMI:  Body mass index is 34.53 kg/m.  Estimated Nutritional Needs:   Kcal:  2000-2200  Protein:  100-120 grams  Fluid:  >2 L/day  Lockie Pares., RD, LDN, CNSC See AMiON for contact information

## 2022-09-02 NOTE — Progress Notes (Signed)
Patient transported to MRI and back without any complications.  

## 2022-09-02 NOTE — Progress Notes (Signed)
120 mL fentanyl wasted in stericycle with Alvera Singh RN.

## 2022-09-02 NOTE — Progress Notes (Signed)
OT Cancellation Note  Patient Details Name: [name] MRN: [id number] DOB: 12/05/1875   Cancelled Treatment:    Reason Eval/Treat Not Completed: Active bedrest order (will assess when medically ready and activity orders updated.)  [facility] 09/02/2022, 7:56 AM Maurie Boettcher, OT/L   Acute OT Clinical Specialist Acute Rehabilitation Services Pager [phone] Office [phone]

## 2022-09-02 NOTE — Progress Notes (Signed)
PT Cancellation Note  Patient Details Name: Dustin Jones MRN: [id number] DOB: 12/05/1875   Cancelled Treatment:    Reason Eval/Treat Not Completed: Patient not medically ready;Active bedrest order. Intubated/sedated. PT to follow.   Lorriane Shire 09/02/2022, 8:16 AM

## 2022-09-02 NOTE — Progress Notes (Signed)
SLP Cancellation Note  Patient Details Name: Dustin Jones MRN: [id number] DOB: 12/05/1875   Cancelled treatment:       Reason Eval/Treat Not Completed: Patient not medically ready (on vent). Will f/u as able.    Osie Bond., M.A. [employer] Office [phone]  Secure chat preferred  09/02/2022, 8:03 AM

## 2022-09-02 NOTE — Progress Notes (Addendum)
NAME:  Dustin Jones, MRN:  [id number], DOB:  12/05/1875, LOS: 1 ADMISSION DATE:  2022-09-28, CONSULTATION DATE:  09/28/2022 REFERRING MD:  Anitra Lauth, MD, CHIEF COMPLAINT:  Intracranial hemorrhage   History of Present Illness:  Patient with an unknown PMH  who is reportedly homeless presented to the [facility] ED as a code stroke this morning. He was last seen walking around at 3 am. Found down this morning at 8 am by bystanders who called 911. EMS reported the pt had L-sided deficits and AMS. Systolic BP reported to be in the 190's per EMS. CT imaging in ED revealed acute intraparenchymal hemorrhage with possible lobulated 24mm outpouching arising from the supraclinoid right ICA that is suspicious for ruptured aneurysm. Was admitted by Neurosurgery for close monitoring and further diagnostic testing. CCM has been consulted for ventilator management.   Pertinent  Medical History  No past medical history on file.   Significant Hospital Events: Including procedures, antibiotic start and stop dates in addition to other pertinent events   9/28 admitted. Intracranial Hemorrhage/ R MCA ruptured aneurysm Non-con CT of head demonstrating large acute hemorrhage in the R lentiform nucleus with with intraventricular extension, midline shift, and slight rounding of the temporal horns, possibly representing early hydrocephalus. On CTA, pt found to have a R MCA ruptured aneurysm. Neurosurgery consulted and following with plans for possible arterial clipping this evening. CT of the face and cervical spine did not show any acute traumatic injuries, though BL cervical lymphadenopathy was noted. Echo with normal biventricular function without evidence of hemodynamically sig valvular heart disease.  9/29 MRI brain 1. Large acute intraparenchymal hemorrhage centered in the rightlentiform nucleus, with extension into the right-greater-than-left lateral ventricle, third ventricle, and fourth ventricle, overall similar to the prior  CT. Mild mass effect and 5 mm of right-to-left midline shift. No hydrocephalus. 2. Small areas of acute infarct in the bilateral occipital lobes, bilateral frontal lobes, and right parietal lobe. Spiking fever. Has sig leukocytosis. Cultures sent. Unasyn started for aspiration   Interim History / Subjective:  Following commands intermittently   Objective   Blood pressure 123/82, pulse 63, temperature 99 F (37.2 C), resp. rate (Abnormal) 23, height 5\' 7"  (1.702 m), weight 100 kg, SpO2 92 %.    Vent Mode: PRVC FiO2 (%):  [70 %-100 %] 100 % Set Rate:  [22 bmp] 22 bmp Vt Set:  [520 mL] 520 mL PEEP:  [10 cmH20-12 cmH20] 12 cmH20 Plateau Pressure:  [21 cmH20-23 cmH20] 21 cmH20   Intake/Output Summary (Last 24 hours) at 09/02/2022 0946 Last data filed at 09/02/2022 0900 Gross per 24 hour  Intake 1367.26 ml  Output 1580 ml  Net -212.74 ml   Filed Weights   09/28/2022 1009  Weight: 100 kg    Examination:  General: Obese male currently sedated on ventilator he is intermittently following commands seems to have some left-sided weakness HEENT normocephalic atraumatic orally intubated pupils equal reactive Pulmonary scattered rhonchi equal breath sounds bilaterally portable chest x-ray from yesterday showing diffuse bilateral patchy airspace disease Cardiac regular rate and rhythm Abdomen soft nontender Extremities warm dry Neuro will follow commands intermittently left side weak GU cl yellow   Resolved Hospital Problem list   Hypertensive emergency   Assessment & Plan:  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 c/b R>L IVH (no hydrocephalus)  Mild acute encephalopathy  MRI w/out much change  Plan Serial neuro checks  SBP goal 130-150, titrate cleveprex  Avoid fevers, HOB >30 euglycemia  Defer timing  of DVT prophylaxis to n surg Will hold off on VT antihypertensives for now as I am a little concerned about SIRS response ans possible evolving sepsis    Acute  hypoxic respiratory failure w/ aspiration PNA/ ALI  Still requiring high FIO2 and peep Plan Ensure LTVW at 57ml/kh Cont to titrate PEEP/FIO2 Respiratory culture  PCT algo  Start Unasyn  Am cxr PAD protocol w/ prop and PRN fent for RASS -2  Fever and Leukocytosis/sepsis 2/2 aspiration   Plan Repeat cbc today  F/u pending smear BC sent Resp culture ordered Abx as above  Ck HIV  Hyperglycemia  Plan Ssi    Best Practice (right click and "Reselect all SmartList Selections" daily)   Diet/type: tubefeeds DVT prophylaxis: SCD GI prophylaxis: PPI Lines: N/A Foley:  Yes, and it is still needed Code Status:  full code  Critical care time: 32 min    Erick Colace ACNP-BC [place] Pager # [phone] OR # [phone] if no answer

## 2022-09-02 NOTE — Progress Notes (Signed)
Self extubated on very high vent settings, somnolent. Reintubated emergently. Hypertensive after, give multiple sedation boluses of fentanyl and etomidate, restarted on propofol. CXR and follow up ABG ordered.  PEEP increased to 14, Pplat 22.  Julian Hy, DO 09/02/22 7:13 PM [facility]

## 2022-09-02 NOTE — Progress Notes (Addendum)
STROKE TEAM PROGRESS NOTE   INTERVAL HISTORY Patient is seen in his room with no family at the bedside.  Yesterday, he was found down outside, was brought to the hospital, intubated and found to have an [place] in the right lentiform nucleus.  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  Patient has been hemodynamically stable and no longer requires Cleviprex.  His neurological exam is stable.  He remains sedated and intubated hence neurological exam is limited.  Blood pressure adequately controlled  Vitals:   09/02/22 1300 09/02/22 1330 09/02/22 1400 09/02/22 1430  BP: 111/69 106/68 110/70 111/74  Pulse: 65 65 67 67  Resp: 13 14 14 19   Temp: 99 F (37.2 C) 99 F (37.2 C) 99.1 F (37.3 C) 99.3 F (37.4 C)  TempSrc:      SpO2: 97% 97% 97% 97%  Weight:      Height:       CBC:  Recent Labs  Lab 09/07/22 0900 2022-09-07 0908 09/02/22 1058 09/02/22 1134  WBC 70.5*  --  61.8*  --   NEUTROABS 9.0*  --   --   --   HGB 15.5   < > 14.6 11.6*  HCT 51.2   < > 44.3 34.0*  MCV 97.0  --  93.1  --   PLT 212  --  193  --    < > = values in this interval not displayed.   Basic Metabolic Panel:  Recent Labs  Lab 09/07/2022 0900 09/07/2022 0908 09-07-22 1122 09/02/22 1058 09/02/22 1134  NA 138 138   < > 139 144  K 4.3 3.6   < > 5.0 3.3*  CL 102 100  --  105  --   CO2 23  --   --  25  --   GLUCOSE 163* 162*  --  134*  --   BUN 11 12  --  26*  --   CREATININE 1.05 0.90  --  1.49*  --   CALCIUM 8.9  --   --  8.1*  --   MG  --   --   --  2.2  --   PHOS  --   --   --  4.5  --    < > = values in this interval not displayed.   Lipid Panel:  Recent Labs  Lab 09/07/22 1236 09/02/22 0804  CHOL 131  --   TRIG 860* 190*  HDL 15*  --   CHOLHDL 8.7  --   VLDL UNABLE TO CALCULATE IF TRIGLYCERIDE OVER 400 mg/dL  --   LDLCALC UNABLE TO CALCULATE IF TRIGLYCERIDE OVER 400 mg/dL  --    HgbA1c:  Recent Labs  Lab 09/07/22 1234  HGBA1C 6.5*   Urine Drug Screen:  Recent Labs  Lab  09-07-2022 1932  LABOPIA NONE DETECTED  COCAINSCRNUR NONE DETECTED  LABBENZ NONE DETECTED  AMPHETMU NONE DETECTED  THCU NONE DETECTED  LABBARB NONE DETECTED    Alcohol Level  Recent Labs  Lab 09/07/22 0900  ETH <10    IMAGING past 24 hours MR BRAIN W WO CONTRAST  Result Date: 09/02/2022 CLINICAL DATA:  Hemorrhagic stroke EXAM: MRI HEAD WITHOUT AND WITH CONTRAST TECHNIQUE: Multiplanar, multiecho pulse sequences of the brain and surrounding structures were obtained without and with intravenous contrast. CONTRAST:  10 mL Vueway COMPARISON:  No prior MRI, correlation is made with CT head 07-Sep-2022 FINDINGS: Brain: Large acute intraparenchymal hemorrhage centered in the right lentiform nucleus, measuring  approximately 6.6 x 2.2 x 3.3 cm (AP x TR x CC) (estimated volume 24 mL), overall similar to the prior CT. Small amount of surrounding T2 hyperintense signal, likely edema. Redemonstrated extension into the right-greater-than-left lateral ventricle, third ventricle, and fourth ventricle. Mild mass effect and 5 mm of right-to-left midline shift. Small area of restricted diffusion in the right occipital lobe (series 5, image 81), with ADC correlate (series 6, image 27), likely acute infarct. Additional smaller foci of acute infarct are noted in the right parietal lobe (series 5, image 91 and 97), left anterior frontal lobe (series 5, image 83), right anterior and inferior frontal lobe (series 5, image 94 and 82), and left occipital lobe (series 5, image 83). No hydrocephalus. Vascular: Normal arterial flow voids. Susceptibility is seen in the location of the previously noted right ICA terminus aneurysm, possibly related to coiling. Otherwise normal arterial and venous enhancement. Skull and upper cervical spine: Normal marrow signal. Sinuses/Orbits: Mild mucosal thickening in the maxillary sinuses. The orbits are unremarkable. Other: Trace fluid in the mastoid air cells. IMPRESSION: 1. Large acute  intraparenchymal hemorrhage centered in the right lentiform nucleus, with extension into the right-greater-than-left lateral ventricle, third ventricle, and fourth ventricle, overall similar to the prior CT. Mild mass effect and 5 mm of right-to-left midline shift. No hydrocephalus. 2. Small areas of acute infarct in the bilateral occipital lobes, bilateral frontal lobes, and right parietal lobe. These results will be called to the ordering clinician or representative by the Radiologist Assistant, and communication documented in the PACS or [employer]. Electronically Signed   By: Wiliam Ke M.D.   On: 09/02/2022 04:02   DG Abd 1 View  Result Date: 09/05/2022 CLINICAL DATA:  Screening for metal prior to MRI. EXAM: ABDOMEN - 1 VIEW COMPARISON:  None Available. FINDINGS: Rectal temperature probe in place. NG tube in the stomach. No additional radiopaque foreign body. Nonobstructive bowel gas pattern. No free air or organomegaly. IMPRESSION: NG tube in the stomach.  Rectal temperature probe in place. No acute findings. Electronically Signed   By: Charlett Nose M.D.   On: 2022-09-05 20:15   CT HEAD WO CONTRAST  Result Date: 09-05-2022 CLINICAL DATA:  Stroke, hemorrhagic Neuro deficit, acute, stroke suspected EXAM: CT HEAD WITHOUT CONTRAST TECHNIQUE: Contiguous axial images were obtained from the base of the skull through the vertex without intravenous contrast. RADIATION DOSE REDUCTION: This exam was performed according to the departmental dose-optimization program which includes automated exposure control, adjustment of the mA and/or kV according to patient size and/or use of iterative reconstruction technique. COMPARISON:  CT head from the same day. FINDINGS: Brain: Unchanged size of the acute intraparenchymal hemorrhage centered in the right lentiform nucleus with mildly increased intraventricular extension. Similar ventricular size and mass effect. No evidence of acute large vascular territory infarct  or mass lesion. Vascular: No hyperdense vessel identified. Skull: No acute fracture. Sinuses/Orbits: Clear sinuses.  No acute orbital findings. Other: No mastoid effusions. IMPRESSION: Unchanged size of the acute intraparenchymal hemorrhage centered in the right lentiform nucleus with mildly increased intraventricular extension. Similar ventricular size and mass effect. Electronically Signed   By: Feliberto Harts M.D.   On: 2022-09-05 16:27    PHYSICAL EXAM General:  Intubated, middle-aged patient in no acute distress who is sedated and intubated Respiratory: Respirations synchronous with ventilator Neurological (on sedation with propofol): Eyes are closed.  PERRL, oculocephalic reflex, cough and gag present.  He opens eyes partially to sternal rub.  He is aphasic and  does not follow commands.  Withdraws RUE and RLE to noxious stimuli, no movement of LUE and LLE.  ASSESSMENT/PLAN Mr. Dustin Jones is a 61 y.o. male with unknown PMH presenting after he was found down outside.  Patient was brought to the hospital, intubated for airway protection and found to have an ICH in the right lentiform nucleus.  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  code Stroke CT head large ICH in right lentiform nucleus with IVH and 65mm midline shift, rounding of temporal horns CTA head & neck lobulated 22mm outpouching from supraclinoid right ICA, adjacent to IPH MRI  large acute IPH in right lentiform nucleus with IVH and 42mm midline shift, no hydrocephalus, small areas of acute infarct in bilateral occipital lobes, bilateral frontal lobes and right parietal lobe 2D Echo EF 60-65%, grade 1 diastolic dysfunction, interatrial septum not well visualized LDL UNABLE TO CALCULATE IF TRIGLYCERIDE OVER 400 mg/dL [id number] 6.5 VTE  prophylaxis - SCDs    Diet   Diet NPO time specified   No antithrombotic prior to admission, now on No antithromboticsecondary to IPH. Therapy recommendations:  pending Disposition:  pending  Hypertension Home meds:  unknown Stable Keep SBP 130-150 Long-term BP goal normotensive  Hyperlipidemia Home meds:  unknown LDL UNABLE TO CALCULATE IF TRIGLYCERIDE OVER 400 mg/dL, goal < 70 Add statin at discharge if indicated  Diabetes type II Controlled Home meds:  unknown HgbA1c 6.5, goal < 7.0 CBGs Recent Labs    09-23-2022 0900 09/02/22 1214  GLUCAP 160* 142*    SSI  Respiratory failure Patient was intubated for airway protection Ventilator management per CCM Attempt to wean from ventilator when possible  Other Stroke Risk Factors Obesity, Body mass index is 34.53 kg/m., BMI >/= 30 associated with increased stroke risk, recommend weight loss, diet and exercise as appropriate   Other Active Problems Patient's name, next of kin and medical history are unknown  Hospital day # 1  Cortney E Ernestina Columbia , MSN, AGACNP-BC [facility] See Amion for schedule and pager information 09/02/2022 3:34 PM   STROKE MD NOTE :  I have personally obtained history,examined this patient, reviewed notes, independently viewed imaging studies, participated in medical decision making and plan of care.ROS completed by me personally and pertinent positives fully documented  I have made any additions or clarifications directly to the above note. Agree with note above.  Patient presented with altered mental status and unresponsiveness secondary to large right basal ganglia intracerebral hemorrhage due to ruptured terminal right ICA aneurysm with intraventricular extension.  Continue close neurological observation and strict blood pressure control with systolic goal 130-150 for the first 24 hours and then below 160.  Continue ventilatory support for respiratory failure as per critical care  team.  Nimodipine for prevention of vasospasm.  Serial transcranial Doppler studies to look for vasospasm.  DVT and GI prophylaxis.  Maintain euvolemia and normothermia normoglycemia.  Continue ongoing critical care as per pulmonary critical care team and neurosurgery.  Stroke team will sign off.  Call for questions.  No family available at bedside.This patient is critically ill and at significant risk of neurological worsening, death and care requires constant monitoring of vital signs, hemodynamics,respiratory and cardiac monitoring, extensive review of multiple databases, frequent neurological assessment, discussion with family, other specialists and medical  decision making of high complexity.I have made any additions or clarifications directly to the above note.This critical care time does not reflect procedure time, or teaching time or supervisory time of PA/NP/Med Resident etc but could involve care discussion time.  I spent 30 minutes of neurocritical care time  in the care of  this patient.     Delia Heady, MD Medical Director [facility] Stroke Center Pager: [phone] 09/02/2022 5:08 PM   To contact Stroke Continuity provider, please refer to [email or web address]. After hours, contact General Neurology

## 2022-09-02 NOTE — Progress Notes (Signed)
RT called to bedside as pt had self extubated and was on NRB. RT assisted CCM MD in re-intubation of pt with no complication.

## 2022-09-02 NOTE — Progress Notes (Signed)
eLink Physician-Brief Progress Note Patient Name: Dustin Jones DOB: 12/05/1875 MRN: [id number]   Date of Service  09/02/2022  HPI/Events of Note  Nursing requests for: 1. Review CXR and abdominal film for ETT and OGT position. Review of CXR reveals ETT distal tip in satisfactory position approximately 4.6 cm above the carina. OGT tip ans side port terminate in satisfactory position in the stomach. 2. No post intubation ABG - Nursing request for ABG.  eICU Interventions  Plan: ETT and OGT both in satisfactory position. OK to use OGT. ABG now.     Intervention Category Major Interventions: Respiratory failure - evaluation and management  [name] 09/02/2022, 11:30 PM

## 2022-09-02 NOTE — Progress Notes (Signed)
  NEUROSURGERY PROGRESS NOTE   No issues overnight.   EXAM:  BP 112/71   Pulse 63   Temp 98.6 F (37 C)   Resp 15   Ht 5\' 7"  (1.702 m)   Wt 100 kg   SpO2 95%   BMI 34.53 kg/m   Arouses easily to voice Opens eyes Pupils reactive Breathing spontaneously Following commands RUE/RLE briskly No movement LUE/LLE Right groin soft  IMPRESSION:  Dustin Jones post-hemorrhage day #1, POD#1 s/p coil embolization of RICA terminus aneurysm with unusual primary intraparenchymal hemorrhage and no significant SAH. Appears neurologically improved today with stable left hemiplegia.  VDRF  PLAN: - Cont vent mgmt per PCCM - SBP control with goal < 162mmHg   Consuella Lose, MD [facility]

## 2022-09-02 NOTE — Progress Notes (Signed)
Patient having desaturation, recruitment maneuver done per ARDS protocol with Sp02 increasing to 97%.

## 2022-09-02 NOTE — Procedures (Signed)
Arterial Line Insertion Start/End9/29/2023 12:00 PM, 09/02/2022 12:20 PM  Patient location: ICU. Preanesthetic checklist: patient identified and timeout performed Right, radial was placed Catheter size: 20 G Hand hygiene performed  and maximum sterile barriers used  Allen's test indicative of satisfactory collateral circulation Attempts: 2 Procedure performed without using ultrasound guided technique. Following insertion, Biopatch and dressing applied. Post procedure assessment: normal  Patient tolerated the procedure well with no immediate complications.

## 2022-09-02 NOTE — Progress Notes (Cosign Needed)
NAME:  Dustin Jones, MRN:  [id number], DOB:  12/05/1875, LOS: 1 ADMISSION DATE:  2022/09/06, CONSULTATION DATE:  09/06/2022 REFERRING MD:  Maryan Rued, MD, CHIEF COMPLAINT:  Intracranial hemorrhage   History of Present Illness:  Unknown age male patient with unknown PMHx presented to the [facility] ED as a code stroke on the morning of 9/28. He is homeless and was last seen walking around at approximately 3 am on 9\28, and he was found around 8 am on 9/28 by bystanders and 911 was activated. EMS noted the patient with left-sided deficits and altered mental status. On arrival to [facility] ED, he required intubation for airway protection.  CT scan demonstrated a large intraparenchymal hematoma, with CT angiogram suggesting possible intracranial aneurysm with possible lobulated 5 mm outpouching arising from the supraclinoid right ICA that is suspicious for ruptured aneurysm.  Pertinent  Medical History  Unable to obtain  Significant Hospital Events: Including procedures, antibiotic start and stop dates in addition to other pertinent events   9\28- Admitted to the Neuro Trauma ICU.  9\28- Coil embolization of RICA terminus aneurysm  Interim History / Subjective:  Follows commands after moderate stimulaion Objective   Blood pressure 124/80, pulse 64, temperature 99 F (37.2 C), resp. rate 17, height 5\' 7"  (1.702 m), weight 100 kg, SpO2 93 %.    Vent Mode: PRVC FiO2 (%):  [70 %-100 %] 100 % Set Rate:  [15 bmp-22 bmp] 22 bmp Vt Set:  [510 mL-520 mL] 520 mL PEEP:  [10 cmH20-12 cmH20] 12 cmH20 Plateau Pressure:  [23 cmH20-25 cmH20] 23 cmH20   Intake/Output Summary (Last 24 hours) at 09/02/2022 0735 Last data filed at 09/02/2022 4098 Gross per 24 hour  Intake 1364.79 ml  Output 1380 ml  Net -15.21 ml   Filed Weights   09/06/22 1009  Weight: 100 kg    Examination: General: Obese and ill-appearing on full mechanical vent support.  HENT: [place]\AT, moist mucus membranes. Symmetrical facial movements.   Lungs: Clear and equal bilateral breath sounds.  Cardiovascular: Regular rate and rhythm without murmurs or gallops. 2+ central pulses.  Abdomen: Obese, soft, and non-tender. No organomegaly and normoactive bowel sounds.   Extremities: RUE\RLE moves against gravity with 2+ pulses and strong hand grasp and foot movement. LUE\LLE no movement against gravity, but pt is able to move toes and fingers.  Neuro: Eyes closed, does not open on command but closes eyes tighter. Awakes with moderate stimulus, and is able to follow commands for physical exam.    Resolved Hospital Problem list     Assessment & Plan:  Acute Intraparenchymal Hemorrhage s\p Coil Embolization of RICA Aneurysm: Serial neuro checks  HOB >30 degrees.  SBP goals of 130-150, use cleveprex to titrate to goal.  Defer Lovenox for DVT to neuro   Acute Hypoxic Resp Failure with Aspiration Pneumonia.  Sputum Culture Maintain Vt of 6 mL\kg  Titrate PEEP and FiO2  Repeat CXR in AM PAD protocol w/ prop and PRN fent for RASS -2 Start Unasyn 3gm   Leukocytosis with Fever and possible early sepsis of unknown etiology presumably secondary to aspiration. Repeat CBC Blood Cultures x2  Sputum Culture  Abx as above.   Hypoglycemia. S\S insulin   Best Practice (right click and "Reselect all SmartList Selections" daily)   Diet/type: NPO DVT prophylaxis: SCD GI prophylaxis: PPI Lines: Arterial Line and yes and it is still needed Foley:  Yes, and it is still needed Code Status:  full code   Labs  CBC: Recent Labs  Lab 08/24/2022 0900 08/23/2022 0908 08/18/2022 1122 08/09/2022 2335  WBC 70.5*  --   --   --   NEUTROABS 9.0*  --   --   --   HGB 15.5 17.3* 17.0 15.3  HCT 51.2 51.0 50.0 45.0  MCV 97.0  --   --   --   PLT 212  --   --   --     Basic Metabolic Panel: Recent Labs  Lab 08/20/2022 0900 08/26/2022 0908 08/16/2022 1122 08/09/2022 2335  NA 138 138 137 137  K 4.3 3.6 3.6 4.1  CL 102 100  --   --   CO2 23  --   --   --    GLUCOSE 163* 162*  --   --   BUN 11 12  --   --   CREATININE 1.05 0.90  --   --   CALCIUM 8.9  --   --   --    GFR: Estimated Creatinine Clearance: -7.4 mL/min (by C-G formula based on SCr of 0.9 mg/dL). Recent Labs  Lab 08/21/2022 0900  WBC 70.5*    Liver Function Tests: Recent Labs  Lab 08/19/2022 0900  AST 64*  ALT 49*  ALKPHOS 74  BILITOT 0.9  PROT 7.6  ALBUMIN 3.9   No results for input(s): "LIPASE", "AMYLASE" in the last 168 hours. No results for input(s): "AMMONIA" in the last 168 hours.  ABG    Component Value Date/Time   PHART 7.362 08/09/2022 2335   PCO2ART 42.9 08/24/2022 2335   PO2ART 58 (L) 08/24/2022 2335   HCO3 24.4 08/24/2022 2335   TCO2 26 08/08/2022 2335   ACIDBASEDEF 1.0 08/18/2022 2335   O2SAT 89 08/27/2022 2335     Coagulation Profile: Recent Labs  Lab 08/26/2022 0900  INR 1.1    Cardiac Enzymes: No results for input(s): "CKTOTAL", "CKMB", "CKMBINDEX", "TROPONINI" in the last 168 hours.  HbA1C: Hgb A1c MFr Bld  Date/Time Value Ref Range Status  08/14/2022 12:34 PM 6.5 (H) 4.8 - 5.6 % Final    Comment:    (NOTE) Pre diabetes:          5.7%-6.4%  Diabetes:              >6.4%  Glycemic control for   <7.0% adults with diabetes     CBG: Recent Labs  Lab 09/02/2022 0900  GLUCAP 160*    Review of Systems:   Unable to perform d\t currently intubated.   Past Medical History:  He has no past medical history on file. Unable to obtain d\t pt being intubated and no friends or family are availab  Surgical History:  Unable to obtain d\t pt being intubated and no friends or family are available.   Social History:  Unable to obtain d\t pt being intubated and no friends or family are available.  Family History:  Unable to obtain d\t pt being intubated and no friends or family are available.  Allergies: Unable to obtain d\t pt being intubated and no friends or family are available.  Home Medications  Prior to Admission medications    Not on File     Critical care time: 35 minutes     Audie Box Student-NP

## 2022-09-02 NOTE — Procedures (Addendum)
Intubation Procedure Note  Dustin Jones  [id number]  12/05/1875  Date:09/02/22  Time:7:10 PM   Provider Performing: Julian Hy    Procedure: Intubation (89211)  Indication(s) Respiratory Failure  Consent Risks of the procedure as well as the alternatives and risks of each were explained to the patient and/or caregiver.  Consent for the procedure was obtained and is signed in the bedside chart   Anesthesia Etomidate, Fentanyl, and Rocuronium   Time Out Verified patient identification, verified procedure, site/side was marked, verified correct patient position, special equipment/implants available, medications/allergies/relevant history reviewed, required imaging and test results available.   Sterile Technique Usual hand hygeine, masks, and gloves were used   Procedure Description Patient positioned in bed supine.  Sedation given as noted above.  Patient was intubated with endotracheal tube using Glidescope.  View was Grade 1 full glottis .  Number of attempts was  2 .  Colorimetric CO2 detector was consistent with tracheal placement.   Complications/Tolerance None; patient tolerated the procedure well. Chest X-ray is ordered to verify placement.   EBL Minimal   Specimen(s) None  Julian Hy, DO 09/02/22 7:10 PM [facility]

## 2022-09-03 LAB — TRIGLYCERIDES: Triglycerides: 304 mg/dL — ABNORMAL HIGH (ref <150)

## 2022-09-03 LAB — BASIC METABOLIC PANEL
Anion gap: 11 (ref 5–15)
Anion gap: 10 (ref 5–15)
BUN: 52 mg/dL — ABNORMAL HIGH (ref 8–23)
BUN: 59 mg/dL — ABNORMAL HIGH (ref 8–23)
CO2: 23 mmol/L (ref 22–32)
CO2: 23 mmol/L (ref 22–32)
Calcium: 8.6 mg/dL — ABNORMAL LOW (ref 8.9–10.3)
Calcium: 7.7 mg/dL — ABNORMAL LOW (ref 8.9–10.3)
Chloride: 105 mmol/L (ref 98–111)
Chloride: 105 mmol/L (ref 98–111)
Creatinine, Ser: 3.58 mg/dL — ABNORMAL HIGH (ref 0.61–1.24)
Creatinine, Ser: 4.46 mg/dL — ABNORMAL HIGH (ref 0.61–1.24)
GFR, Estimated: 11 mL/min — ABNORMAL LOW (ref >60)
GFR, Estimated: 8 mL/min — ABNORMAL LOW (ref >60)
Glucose, Bld: 150 mg/dL — ABNORMAL HIGH (ref 70–99)
Glucose, Bld: 148 mg/dL — ABNORMAL HIGH (ref 70–99)
Potassium: 5.4 mmol/L — ABNORMAL HIGH (ref 3.5–5.1)
Potassium: 4.8 mmol/L (ref 3.5–5.1)
Sodium: 139 mmol/L (ref 135–145)
Sodium: 138 mmol/L (ref 135–145)

## 2022-09-03 LAB — POCT I-STAT 7, (LYTES, BLD GAS, ICA,H+H)
Acid-base deficit: 1 mmol/L (ref 0.0–2.0)
Acid-base deficit: 5 mmol/L — ABNORMAL HIGH (ref 0.0–2.0)
Bicarbonate: 23.4 mmol/L (ref 20.0–28.0)
Bicarbonate: 25 mmol/L (ref 20.0–28.0)
Calcium, Ion: 1.07 mmol/L — ABNORMAL LOW (ref 1.15–1.40)
Calcium, Ion: 1.14 mmol/L — ABNORMAL LOW (ref 1.15–1.40)
HCT: 42 % (ref 39.0–52.0)
HCT: 43 % (ref 39.0–52.0)
Hemoglobin: 14.3 g/dL (ref 13.0–17.0)
Hemoglobin: 14.6 g/dL (ref 13.0–17.0)
O2 Saturation: 90 %
O2 Saturation: 94 %
Patient temperature: 37.3
Patient temperature: 98.6
Potassium: 4.1 mmol/L (ref 3.5–5.1)
Potassium: 4.3 mmol/L (ref 3.5–5.1)
Sodium: 139 mmol/L (ref 135–145)
Sodium: 140 mmol/L (ref 135–145)
TCO2: 25 mmol/L (ref 22–32)
TCO2: 26 mmol/L (ref 22–32)
pCO2 arterial: 46.5 mmHg (ref 32–48)
pCO2 arterial: 58.9 mmHg — ABNORMAL HIGH (ref 32–48)
pH, Arterial: 7.208 — ABNORMAL LOW (ref 7.35–7.45)
pH, Arterial: 7.341 — ABNORMAL LOW (ref 7.35–7.45)
pO2, Arterial: 71 mmHg — ABNORMAL LOW (ref 83–108)
pO2, Arterial: 77 mmHg — ABNORMAL LOW (ref 83–108)

## 2022-09-03 LAB — CBC
HCT: 45.1 % (ref 39.0–52.0)
Hemoglobin: 13.9 g/dL (ref 13.0–17.0)
MCH: 30 pg (ref 26.0–34.0)
MCHC: 30.8 g/dL (ref 30.0–36.0)
MCV: 97.2 fL (ref 80.0–100.0)
Platelets: 185 10*3/uL (ref 150–400)
RBC: 4.64 MIL/uL (ref 4.22–5.81)
RDW: 14.2 % (ref 11.5–15.5)
WBC: 75.3 10*3/uL (ref 4.0–10.5)
nRBC: 0 % (ref 0.0–0.2)

## 2022-09-03 LAB — GLUCOSE, CAPILLARY
Glucose-Capillary: 129 mg/dL — ABNORMAL HIGH (ref 70–99)
Glucose-Capillary: 137 mg/dL — ABNORMAL HIGH (ref 70–99)
Glucose-Capillary: 137 mg/dL — ABNORMAL HIGH (ref 70–99)
Glucose-Capillary: 150 mg/dL — ABNORMAL HIGH (ref 70–99)
Glucose-Capillary: 151 mg/dL — ABNORMAL HIGH (ref 70–99)
Glucose-Capillary: 154 mg/dL — ABNORMAL HIGH (ref 70–99)

## 2022-09-03 LAB — PROCALCITONIN: Procalcitonin: 0.99 ng/mL

## 2022-09-03 LAB — MAGNESIUM: Magnesium: 2.5 mg/dL — ABNORMAL HIGH (ref 1.7–2.4)

## 2022-09-03 LAB — HIV ANTIBODY (ROUTINE TESTING W REFLEX): HIV Screen 4th Generation wRfx: NONREACTIVE

## 2022-09-03 LAB — LDL CHOLESTEROL, DIRECT: Direct LDL: 83 mg/dL (ref 0–99)

## 2022-09-03 LAB — PHOSPHORUS: Phosphorus: 7.4 mg/dL — ABNORMAL HIGH (ref 2.5–4.6)

## 2022-09-03 MED ORDER — METOPROLOL TARTRATE 25 MG/10 ML ORAL SUSPENSION
25.0000 mg | Freq: Two times a day (BID) | ORAL | Status: DC
  Administered 2022-09-03 – 2022-09-05 (×6): 25 mg
  Filled 2022-09-03 (×6): qty 10

## 2022-09-03 MED ORDER — NEPRO/CARBSTEADY PO LIQD
1000.0000 mL | ORAL | Status: DC
  Administered 2022-09-03 – 2022-09-04 (×2): 1000 mL
  Filled 2022-09-03 (×3): qty 1185

## 2022-09-03 MED ORDER — LACTATED RINGERS IV BOLUS
1000.0000 mL | Freq: Once | INTRAVENOUS | Status: AC
  Administered 2022-09-03: 1000 mL via INTRAVENOUS

## 2022-09-03 MED ORDER — SODIUM ZIRCONIUM CYCLOSILICATE 10 G PO PACK
10.0000 g | PACK | Freq: Once | ORAL | Status: AC
  Administered 2022-09-03: 10 g
  Filled 2022-09-03: qty 1

## 2022-09-03 MED ORDER — HEPARIN SODIUM (PORCINE) 5000 UNIT/ML IJ SOLN
5000.0000 [IU] | Freq: Three times a day (TID) | INTRAMUSCULAR | Status: DC
  Administered 2022-09-03 – 2022-11-09 (×198): 5000 [IU] via SUBCUTANEOUS
  Filled 2022-09-03 (×200): qty 1

## 2022-09-03 NOTE — Progress Notes (Signed)
Recruitment maneuver done due to continuous low sats. Sats are now at 94%. Will try to wean PEEP/FiO2 if sats allow.

## 2022-09-03 NOTE — Progress Notes (Signed)
eLink Physician-Brief Progress Note Patient Name: [name] DOB: 12/05/1875 MRN: [id number]   Date of Service  09/03/2022  HPI/Events of Note  Oliguria - Urine output 10 mL/hour. Bladder scan with 4 mL. Urine output for the shift = 300 mL. LVEF = 60-65%.  eICU Interventions  Plan: Bolus with LR 1 liter IV over 1 hour now.      Intervention Category Major Interventions: Other:  Valary Manahan Cornelia Copa 09/03/2022, 5:29 AM

## 2022-09-03 NOTE — Progress Notes (Addendum)
NAME:  Dustin Jones, MRN:  [id number], DOB:  12/05/1875, LOS: 2 ADMISSION DATE:  08/28/2022, CONSULTATION DATE:  08/18/2022 REFERRING MD:  Maryan Rued, MD, CHIEF COMPLAINT:  Intracranial hemorrhage   History of Present Illness:  Patient with an unknown PMH  who is reportedly homeless presented to the [facility] ED as a code stroke this morning. He was last seen walking around at 3 am. Found down this morning at 8 am by bystanders who called 911. EMS reported the pt had L-sided deficits and AMS. Systolic BP reported to be in the 190's per EMS. CT imaging in ED revealed acute intraparenchymal hemorrhage with possible lobulated 74mm outpouching arising from the supraclinoid right ICA that is suspicious for ruptured aneurysm. Was admitted by Neurosurgery for close monitoring and further diagnostic testing. CCM has been consulted for ventilator management.   Pertinent  Medical History  No past medical history on file.   Significant Hospital Events: Including procedures, antibiotic start and stop dates in addition to other pertinent events   9/28 admitted. Intracranial Hemorrhage/ R MCA ruptured aneurysm Non-con CT of head demonstrating large acute hemorrhage in the R lentiform nucleus with with intraventricular extension, midline shift, and slight rounding of the temporal horns, possibly representing early hydrocephalus. On CTA, pt found to have a R MCA ruptured aneurysm. Neurosurgery consulted and following with plans for possible arterial clipping this evening. CT of the face and cervical spine did not show any acute traumatic injuries, though BL cervical lymphadenopathy was noted. Echo with normal biventricular function without evidence of hemodynamically sig valvular heart disease.  9/29 MRI brain 1. Large acute intraparenchymal hemorrhage centered in the rightlentiform nucleus, with extension into the right-greater-than-left lateral ventricle, third ventricle, and fourth ventricle, overall similar to the prior  CT. Mild mass effect and 5 mm of right-to-left midline shift. No hydrocephalus. 2. Small areas of acute infarct in the bilateral occipital lobes, bilateral frontal lobes, and right parietal lobe. Spiking fever. Has sig leukocytosis. Cultures sent. Unasyn started for aspiration  9/29 respiratory culture >>  9/29 > self extubated, required reintubation  Interim History / Subjective:  Self extubated afternoon 9/29, required reintubation due to somnolence, poor airway protection and associated hypoxemia Some oliguria overnight, bolused 1 L IV fluids 1.00, PEEP 14, rate 28 Remains on propofol, currently 50, fentanyl 125  Objective   Blood pressure 122/65, pulse 63, temperature 98.8 F (37.1 C), temperature source Bladder, resp. rate (!) 28, height 5\' 7"  (1.702 m), weight 100 kg, SpO2 93 %.    Vent Mode: PRVC FiO2 (%):  [70 %-100 %] 90 % Set Rate:  [28 bmp] 28 bmp Vt Set:  [400 mL] 400 mL PEEP:  [10 cmH20-14 cmH20] 14 cmH20 Plateau Pressure:  [22 cmH20-25 cmH20] 25 cmH20   Intake/Output Summary (Last 24 hours) at 09/03/2022 0824 Last data filed at 09/03/2022 0744 Gross per 24 hour  Intake 1703.23 ml  Output 765 ml  Net 938.23 ml   Filed Weights   08/26/2022 1009  Weight: 100 kg    Examination:  General: Obese man, intubated and sedated HEENT ET tube in place, oropharynx clear, no significant secretions Pulmonary bilateral rhonchi, decreased breath sounds of both bases and few inspiratory crackles Cardiac regular, no murmur Abdomen obese, soft, nondistended Extremities trace pretibial edema Neuro sedated, not following commands today (did so on 9/29)  Resolved Hospital Problem list   Hypertensive emergency   Assessment & Plan:  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 c/b R>L IVH (no hydrocephalus)  Mild acute encephalopathy  MRI w/out much change  Plan -Serial neurochecks -SBP goal 130-150, currently off Cleviprex -No DVT prophylaxis for now, will  initiate when okay with neurosurgery -Nimodipine as ordered -Should be able to add enteral antihypertensives > metoprolol 25 mg twice daily on 9/30  Acute hypoxic respiratory failure w/ aspiration PNA/ ALI Self extubation 9/29, required reintubation Still requiring high FIO2 and peep Plan -Continue 60 cc/kg PRBC -Wean PEEP and FiO2 as able -Follow ABG and chest x-ray -Continue Unasyn for possible aspiration pneumonia, plan 7 days -PAD protocol for adequate sedation, RASS goal -2  Fever and profound leukocytosis, cause unclear but consider hematologic malignancy versus sepsis with large leukemoid reaction Plan -Continue Unasyn, suspected aspiration pneumonia -Follow CBC -Follow-up peripheral smear results -Follow culture results -Check HIV -May require hematology/oncology evaluation depending on peripheral smear results  Acute renal failure, now oliguric with mild hyperkalemia Plan -IV fluid bolus this morning and follow urine output -Follow BMP afternoon 9/30 -May need to involve nephrology 9/30  Hyperglycemia  Plan -Sliding-scale insulin   Best Practice (right click and "Reselect all SmartList Selections" daily)   Diet/type: tubefeeds DVT prophylaxis: SCD GI prophylaxis: PPI Lines: N/A Foley:  Yes, and it is still needed Code Status:  full code  Critical care time: 32 min      Baltazar Apo, MD, PhD 09/03/2022, 8:35 AM [facility] [phone] or if no answer before 7:00PM call [phone] For any issues after 7:00PM please call eLink [phone]

## 2022-09-03 NOTE — Progress Notes (Signed)
PT Cancellation Note  Patient Details Name: Dustin Jones MRN: [id number] DOB: 12/05/1875   Cancelled Treatment:    Reason Eval/Treat Not Completed: Medical issues which prohibited therapy remain this morning. Per discussion with RN, pt remains intubated with poor respiratory stability. Will continue to follow and evaluate as appropriate.   West Carbo, PT, DPT   Acute Rehabilitation Department   Sandra Cockayne 09/03/2022, 8:07 AM

## 2022-09-03 NOTE — Progress Notes (Signed)
OT Cancellation Note  Patient Details Name: Dustin Jones MRN: [id number] DOB: 12/05/1875   Cancelled Treatment:    Reason Eval/Treat Not Completed: Medical issues which prohibited therapy (Per RN pt's FiO2 of 100% with PEEP of 14. OT eval to f/u when medically appropriate.)  Elliot Cousin 09/03/2022, 9:24 AM

## 2022-09-03 NOTE — Progress Notes (Signed)
ABG given to MD. Recruitment maneuver done. RR changed to 35 and PEEP to 16 per CCM.

## 2022-09-03 NOTE — Progress Notes (Signed)
Patient ID: Dustin Jones, male   DOB: 12/05/1875, 61 y.o.   MRN: [id number] Intubated sedated Pupils small reactive Good cough Exam is stable Multiple medical issues though.

## 2022-09-03 NOTE — Progress Notes (Addendum)
eLink Physician-Brief Progress Note Patient Name: [name] DOB: 12/05/1875 MRN: [id number]   Date of Service  09/03/2022  HPI/Events of Note  ABG on 100%/PRVC 28/TV 400/P 12 = 7.341/46.5/77/25. Ppeak = 25  eICU Interventions  Plan: Increase PEEP to 14.  Wean FiO2 as tolerated.     Intervention Category Major Interventions: Respiratory failure - evaluation and management  Kiarrah Rausch Eugene 09/03/2022, 12:55 AM

## 2022-09-04 ENCOUNTER — Inpatient Hospital Stay (HOSPITAL_COMMUNITY): Payer: Medicaid Other

## 2022-09-04 DIAGNOSIS — N171 Acute kidney failure with acute cortical necrosis: Secondary | ICD-10-CM

## 2022-09-04 LAB — COMPREHENSIVE METABOLIC PANEL
ALT: 65 U/L — ABNORMAL HIGH (ref 0–44)
AST: 58 U/L — ABNORMAL HIGH (ref 15–41)
Albumin: 3.1 g/dL — ABNORMAL LOW (ref 3.5–5.0)
Alkaline Phosphatase: 58 U/L (ref 38–126)
Anion gap: 15 (ref 5–15)
BUN: 70 mg/dL — ABNORMAL HIGH (ref 8–23)
CO2: 21 mmol/L — ABNORMAL LOW (ref 22–32)
Calcium: 8.4 mg/dL — ABNORMAL LOW (ref 8.9–10.3)
Chloride: 103 mmol/L (ref 98–111)
Creatinine, Ser: 5.73 mg/dL — ABNORMAL HIGH (ref 0.61–1.24)
GFR, Estimated: 6 mL/min — ABNORMAL LOW (ref >60)
Glucose, Bld: 154 mg/dL — ABNORMAL HIGH (ref 70–99)
Potassium: 5.1 mmol/L (ref 3.5–5.1)
Sodium: 139 mmol/L (ref 135–145)
Total Bilirubin: 0.6 mg/dL (ref 0.3–1.2)
Total Protein: 6.4 g/dL — ABNORMAL LOW (ref 6.5–8.1)

## 2022-09-04 LAB — CBC
HCT: 43.4 % (ref 39.0–52.0)
Hemoglobin: 12.9 g/dL — ABNORMAL LOW (ref 13.0–17.0)
MCH: 30 pg (ref 26.0–34.0)
MCHC: 29.7 g/dL — ABNORMAL LOW (ref 30.0–36.0)
MCV: 100.9 fL — ABNORMAL HIGH (ref 80.0–100.0)
Platelets: 160 10*3/uL (ref 150–400)
RBC: 4.3 MIL/uL (ref 4.22–5.81)
RDW: 14.7 % (ref 11.5–15.5)
WBC: 59 10*3/uL (ref 4.0–10.5)
nRBC: 0 % (ref 0.0–0.2)

## 2022-09-04 LAB — BASIC METABOLIC PANEL
Anion gap: 14 (ref 5–15)
BUN: 81 mg/dL — ABNORMAL HIGH (ref 8–23)
CO2: 23 mmol/L (ref 22–32)
Calcium: 8.3 mg/dL — ABNORMAL LOW (ref 8.9–10.3)
Chloride: 104 mmol/L (ref 98–111)
Creatinine, Ser: 5.61 mg/dL — ABNORMAL HIGH (ref 0.61–1.24)
GFR, Estimated: 6 mL/min — ABNORMAL LOW (ref >60)
Glucose, Bld: 146 mg/dL — ABNORMAL HIGH (ref 70–99)
Potassium: 4.9 mmol/L (ref 3.5–5.1)
Sodium: 141 mmol/L (ref 135–145)

## 2022-09-04 LAB — CULTURE, RESPIRATORY W GRAM STAIN

## 2022-09-04 LAB — GLUCOSE, CAPILLARY
Glucose-Capillary: 143 mg/dL — ABNORMAL HIGH (ref 70–99)
Glucose-Capillary: 133 mg/dL — ABNORMAL HIGH (ref 70–99)
Glucose-Capillary: 135 mg/dL — ABNORMAL HIGH (ref 70–99)
Glucose-Capillary: 135 mg/dL — ABNORMAL HIGH (ref 70–99)
Glucose-Capillary: 136 mg/dL — ABNORMAL HIGH (ref 70–99)
Glucose-Capillary: 152 mg/dL — ABNORMAL HIGH (ref 70–99)

## 2022-09-04 LAB — POCT I-STAT 7, (LYTES, BLD GAS, ICA,H+H)
Acid-base deficit: 6 mmol/L — ABNORMAL HIGH (ref 0.0–2.0)
Acid-base deficit: 6 mmol/L — ABNORMAL HIGH (ref 0.0–2.0)
Acid-base deficit: 6 mmol/L — ABNORMAL HIGH (ref 0.0–2.0)
Acid-base deficit: 4 mmol/L — ABNORMAL HIGH (ref 0.0–2.0)
Bicarbonate: 22.7 mmol/L (ref 20.0–28.0)
Bicarbonate: 23.3 mmol/L (ref 20.0–28.0)
Bicarbonate: 23.5 mmol/L (ref 20.0–28.0)
Bicarbonate: 24.1 mmol/L (ref 20.0–28.0)
Calcium, Ion: 1.05 mmol/L — ABNORMAL LOW (ref 1.15–1.40)
Calcium, Ion: 1.05 mmol/L — ABNORMAL LOW (ref 1.15–1.40)
Calcium, Ion: 1.04 mmol/L — ABNORMAL LOW (ref 1.15–1.40)
Calcium, Ion: 1.03 mmol/L — ABNORMAL LOW (ref 1.15–1.40)
HCT: 40 % (ref 39.0–52.0)
HCT: 40 % (ref 39.0–52.0)
HCT: 39 % (ref 39.0–52.0)
HCT: 41 % (ref 39.0–52.0)
Hemoglobin: 13.6 g/dL (ref 13.0–17.0)
Hemoglobin: 13.6 g/dL (ref 13.0–17.0)
Hemoglobin: 13.3 g/dL (ref 13.0–17.0)
Hemoglobin: 13.9 g/dL (ref 13.0–17.0)
O2 Saturation: 98 %
O2 Saturation: 96 %
O2 Saturation: 98 %
O2 Saturation: 98 %
Patient temperature: 37.1
Patient temperature: 98
Patient temperature: 98
Patient temperature: 37.3
Potassium: 4.4 mmol/L (ref 3.5–5.1)
Potassium: 4.4 mmol/L (ref 3.5–5.1)
Potassium: 4.4 mmol/L (ref 3.5–5.1)
Potassium: 4.5 mmol/L (ref 3.5–5.1)
Sodium: 136 mmol/L (ref 135–145)
Sodium: 139 mmol/L (ref 135–145)
Sodium: 138 mmol/L (ref 135–145)
Sodium: 139 mmol/L (ref 135–145)
TCO2: 24 mmol/L (ref 22–32)
TCO2: 25 mmol/L (ref 22–32)
TCO2: 25 mmol/L (ref 22–32)
TCO2: 26 mmol/L (ref 22–32)
pCO2 arterial: 56 mmHg — ABNORMAL HIGH (ref 32–48)
pCO2 arterial: 63.6 mmHg — ABNORMAL HIGH (ref 32–48)
pCO2 arterial: 60.6 mmHg — ABNORMAL HIGH (ref 32–48)
pCO2 arterial: 57.1 mmHg — ABNORMAL HIGH (ref 32–48)
pH, Arterial: 7.216 — ABNORMAL LOW (ref 7.35–7.45)
pH, Arterial: 7.17 — CL (ref 7.35–7.45)
pH, Arterial: 7.194 — CL (ref 7.35–7.45)
pH, Arterial: 7.234 — ABNORMAL LOW (ref 7.35–7.45)
pO2, Arterial: 119 mmHg — ABNORMAL HIGH (ref 83–108)
pO2, Arterial: 102 mmHg (ref 83–108)
pO2, Arterial: 136 mmHg — ABNORMAL HIGH (ref 83–108)
pO2, Arterial: 129 mmHg — ABNORMAL HIGH (ref 83–108)

## 2022-09-04 LAB — URINALYSIS, ROUTINE W REFLEX MICROSCOPIC
Bilirubin Urine: NEGATIVE
Glucose, UA: NEGATIVE mg/dL
Ketones, ur: NEGATIVE mg/dL
Nitrite: NEGATIVE
Protein, ur: 100 mg/dL — AB
RBC / HPF: >50 RBC/hpf — ABNORMAL HIGH (ref 0–5)
Specific Gravity, Urine: 1.023 (ref 1.005–1.030)
WBC, UA: >50 WBC/hpf — ABNORMAL HIGH (ref 0–5)
pH: 5 (ref 5.0–8.0)

## 2022-09-04 LAB — MAGNESIUM: Magnesium: 2.7 mg/dL — ABNORMAL HIGH (ref 1.7–2.4)

## 2022-09-04 LAB — PHOSPHORUS: Phosphorus: 10.1 mg/dL — ABNORMAL HIGH (ref 2.5–4.6)

## 2022-09-04 LAB — PROTEIN / CREATININE RATIO, URINE
Creatinine, Urine: 215 mg/dL
Protein Creatinine Ratio: 0.49 mg/mg{Cre} — ABNORMAL HIGH (ref 0.00–0.15)
Total Protein, Urine: 106 mg/dL

## 2022-09-04 LAB — TRIGLYCERIDES: Triglycerides: 404 mg/dL — ABNORMAL HIGH (ref <150)

## 2022-09-04 LAB — PROCALCITONIN: Procalcitonin: 0.99 ng/mL

## 2022-09-04 MED ORDER — MIDAZOLAM-SODIUM CHLORIDE 100-0.9 MG/100ML-% IV SOLN
0.0000 mg/h | INTRAVENOUS | Status: DC
  Administered 2022-09-04 (×3): 2 mg/h via INTRAVENOUS
  Administered 2022-09-05 (×2): 6 mg/h via INTRAVENOUS
  Administered 2022-09-06: 7 mg/h via INTRAVENOUS
  Administered 2022-09-07: 10 mg/h via INTRAVENOUS
  Administered 2022-09-07: 7 mg/h via INTRAVENOUS
  Administered 2022-09-08: 10 mg/h via INTRAVENOUS
  Administered 2022-09-08: 6 mg/h via INTRAVENOUS
  Administered 2022-09-09 – 2022-09-10 (×3): 8 mg/h via INTRAVENOUS
  Filled 2022-09-04 (×11): qty 100

## 2022-09-04 MED ORDER — CEFAZOLIN SODIUM-DEXTROSE 1-4 GM/50ML-% IV SOLN
1.0000 g | Freq: Two times a day (BID) | INTRAVENOUS | Status: DC
  Administered 2022-09-04 – 2022-09-06 (×5): 1 g via INTRAVENOUS
  Filled 2022-09-04 (×5): qty 50

## 2022-09-04 MED ORDER — MIDAZOLAM BOLUS VIA INFUSION
0.0000 mg | INTRAVENOUS | Status: DC | PRN
  Administered 2022-09-04: 1 mg via INTRAVENOUS
  Administered 2022-09-05 – 2022-09-06 (×3): 2 mg via INTRAVENOUS
  Administered 2022-09-07: 1 mg via INTRAVENOUS
  Administered 2022-09-08: 4 mg via INTRAVENOUS
  Administered 2022-09-08 – 2022-09-09 (×3): 2 mg via INTRAVENOUS

## 2022-09-04 MED ORDER — SODIUM CHLORIDE 0.9 % IV SOLN: 3.0000 g | INTRAVENOUS | Status: DC

## 2022-09-04 MED ORDER — FUROSEMIDE 10 MG/ML IJ SOLN
20.0000 mg | Freq: Once | INTRAMUSCULAR | Status: AC
  Administered 2022-09-04: 20 mg via INTRAVENOUS
  Filled 2022-09-04: qty 2

## 2022-09-04 MED ORDER — FUROSEMIDE 10 MG/ML IJ SOLN
160.0000 mg | Freq: Once | INTRAVENOUS | Status: AC
  Administered 2022-09-04: 160 mg via INTRAVENOUS
  Filled 2022-09-04: qty 10

## 2022-09-04 MED ORDER — SODIUM BICARBONATE 8.4 % IV SOLN
INTRAVENOUS | Status: DC
  Filled 2022-09-04 (×3): qty 1000

## 2022-09-04 NOTE — Progress Notes (Signed)
PT Cancellation Note  Patient Details Name: Dustin Jones MRN: [id number] DOB: 12/05/1875   Cancelled Treatment:    Reason Eval/Treat Not Completed: Patient not medically ready at this time. Remains intubated, sedated, and with FiO2 of 70% at this time. Will continue to follow and evaluate as appropriate.   West Carbo, PT, DPT   Acute Rehabilitation Department   Sandra Cockayne 09/04/2022, 12:35 PM

## 2022-09-04 NOTE — Progress Notes (Signed)
PHARMACY NOTE:  ANTIMICROBIAL RENAL DOSAGE ADJUSTMENT  Current antimicrobial regimen includes a mismatch between antimicrobial dosage and estimated renal function.  As per policy approved by the Pharmacy & Therapeutics and Medical Executive Committees, the antimicrobial dosage will be adjusted accordingly.  Current antimicrobial dosage:  Unasyn 3g IV every 6 hours  Indication: PNA  Renal Function:  Estimated Creatinine Clearance: -1.3 mL/min (A) (by C-G formula based on SCr of 5.73 mg/dL (H)). []      On intermittent HD, scheduled: []      On CRRT    Antimicrobial dosage has been changed to:  Unasyn 3g IV every 24 hours  Additional comments:   Thank you for allowing pharmacy to be a part of this patient's care.  Alycia Rossetti, PharmD, BCPS Infectious Diseases Clinical Pharmacist 09/04/2022 7:24 AM   **Pharmacist phone directory can now be found on [email or web address] ([email or web address]).  Listed under [place].

## 2022-09-04 NOTE — Progress Notes (Signed)
NAME:  Dustin Jones, MRN:  [id number], DOB:  12/05/1875, LOS: 3 ADMISSION DATE:  Sep 26, 2022, CONSULTATION DATE:  September 26, 2022 REFERRING MD:  Anitra Lauth, MD, CHIEF COMPLAINT:  Intracranial hemorrhage   History of Present Illness:  Patient with an unknown PMH  who is reportedly homeless presented to the [facility] ED as a code stroke this morning. He was last seen walking around at 3 am. Found down this morning at 8 am by bystanders who called 911. EMS reported the pt had L-sided deficits and AMS. Systolic BP reported to be in the 190's per EMS. CT imaging in ED revealed acute intraparenchymal hemorrhage with possible lobulated 28mm outpouching arising from the supraclinoid right ICA that is suspicious for ruptured aneurysm. Was admitted by Neurosurgery for close monitoring and further diagnostic testing. CCM has been consulted for ventilator management.   Pertinent  Medical History  No past medical history on file.   Significant Hospital Events: Including procedures, antibiotic start and stop dates in addition to other pertinent events   9/28 admitted. Intracranial Hemorrhage/ R MCA ruptured aneurysm Non-con CT of head demonstrating large acute hemorrhage in the R lentiform nucleus with with intraventricular extension, midline shift, and slight rounding of the temporal horns, possibly representing early hydrocephalus. On CTA, pt found to have a R MCA ruptured aneurysm. Neurosurgery consulted and following with plans for possible arterial clipping this evening. CT of the face and cervical spine did not show any acute traumatic injuries, though BL cervical lymphadenopathy was noted. Echo with normal biventricular function without evidence of hemodynamically sig valvular heart disease.  9/29 MRI brain 1. Large acute intraparenchymal hemorrhage centered in the rightlentiform nucleus, with extension into the right-greater-than-left lateral ventricle, third ventricle, and fourth ventricle, overall similar to the prior  CT. Mild mass effect and 5 mm of right-to-left midline shift. No hydrocephalus. 2. Small areas of acute infarct in the bilateral occipital lobes, bilateral frontal lobes, and right parietal lobe. Spiking fever. Has sig leukocytosis. Cultures sent. Unasyn started for aspiration  9/29 respiratory culture >>  9/29 > self extubated, required reintubation  Interim History / Subjective:  Only 172 cc urine output over last 24 hours.  Progressive acute renal failure High vent needs: 0.90, PEEP 16, rate 35, 6 cc/kg.  ARDS physiology Propofol 35, fentanyl 150 Peripheral smear still pending WBC 59  Objective   Blood pressure 127/69, pulse 63, temperature 98.8 F (37.1 C), resp. rate (!) 35, height 5\' 7"  (1.702 m), weight 127.8 kg, SpO2 96 %.    Vent Mode: PRVC FiO2 (%):  [90 %-100 %] 90 % Set Rate:  [28 bmp-35 bmp] 35 bmp Vt Set:  [400 mL] 400 mL PEEP:  [14 cmH20-16 cmH20] 16 cmH20 Plateau Pressure:  [23 cmH20-25 cmH20] 25 cmH20   Intake/Output Summary (Last 24 hours) at 09/04/2022 11/04/2022 Last data filed at 09/04/2022 0700 Gross per 24 hour  Intake 3452.59 ml  Output 147 ml  Net 3305.59 ml   Filed Weights   Sep 26, 2022 1009 09/04/22 0155  Weight: 100 kg 127.8 kg    Examination:  General: Obese man in abated and sedated HEENT ET tube in place, moist, somewhat enlarged tongue, no lesions Pulmonary bilateral rhonchi, decreased breath sounds both bases Cardiac regular, no murmur Abdomen obese, slightly distended, soft, umbilical hernia Extremities 1+ peripheral edema Neuro sedated, does arouse to voice, tracks  Resolved Hospital Problem list   Hypertensive emergency   Assessment & Plan:  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 c/b R>L IVH (no  hydrocephalus)  Mild acute encephalopathy  MRI w/out much change  Plan -Serial neurochecks -SBP goal 130-150 (off Cleviprex) -Deferring DVT prophylaxis for now, will initiate when okay with neurosurgery -Continue enteral  metoprolol -Continue scheduled nimodipine  Acute hypoxic respiratory failure w/ aspiration PNA/ ALI Self extubation 9/29, required reintubation Still requiring high FIO2 and peep Plan -ARDS ventilator strategy with 6 cc/kg PRVC -Wean PEEP and FiO2 as able -Follow chest x-ray and ABG -PAD protocol, requiring deeper sedation at this time given ventilator needs.  May need to consider paralytics and/or proning -Continue Unasyn for possible aspiration pneumonia, plan for 7 days total   Fever and profound leukocytosis, cause unclear but consider hematologic malignancy versus sepsis with large leukemoid reaction Plan -Continue Unasyn, suspected aspiration event -Follow culture results -Follow his CBC, has improved some but still profoundly elevated.  Peripheral smear is pending. -HIV negative -We will consult hematology oncology depending on peripheral smear results.  Concerning for possible malignancy given lymphocyte predominance on differential   Acute renal failure, now oliguric/anuric with mild hyperkalemia Plan -Has failed to respond to IV fluid boluses, +4.5 L total -We will consult nephrology 10/1.  Suspect that he is headed for hemodialysis.  May want to consider a Lasix challenge  Hyperglycemia  Plan -Sliding scale insulin   Best Practice (right click and "Reselect all SmartList Selections" daily)   Diet/type: tubefeeds DVT prophylaxis: SCD GI prophylaxis: PPI Lines: N/A Foley:  Yes, and it is still needed Code Status:  full code  Critical care time: 34 min      Baltazar Apo, MD, PhD 09/04/2022, 8:14 AM [facility] [phone] or if no answer before 7:00PM call [phone] For any issues after 7:00PM please call eLink [phone]

## 2022-09-04 NOTE — Consult Note (Signed)
[place] [facility] Admit Date: 2022/10/01 09/04/2022 Arita Miss Requesting Physician:  Delton Coombes MD  Reason for Consult:  AKI  HPI:  Unnamed male of unclear age, reportedly homeless, brought to the ED 9/28 after being found by bystanders with encephalopathy and left-sided deficits.  Blood pressures were elevated and imaging in the ED identified acute intraparenchymal hemorrhage with concern for ruptured aneurysm.  Patient admitted to the ICU, intubated.  He has undergone clipping of his aneurysm.  Course complicated by likely aspiration event with pneumonia resultant ARDS.  Further has persistent leukocytosis/lymphocytosis worrisome for underlying malignancy.  Patient has normal GFR at baseline.  Has had progressive worsening of creatinine from presentation.  Had quite a few contrast exposures in the first portion of his hospitalization.  No hypotension.  Urine output following each day.  No other obvious nephrotoxic exposures.  Urine at presentation with hematuria, pyuria, trace proteinuria, unclear if this was after a Foley catheter which is currently present.  No renal imaging.  PMH Incudes: Unknown, presumed hypertension   Creatinine, Ser (mg/dL)  Date Value  30/16/0109 5.73 (H)  09/03/2022 4.46 (H)  09/03/2022 3.58 (H)  09/02/2022 1.49 (H)  10/01/2022 0.90  10-01-22 1.05  ] I/Os: I/O last 3 completed shifts: In: 4978.9 [I.V.:1510.4; NG/GT:1787.5; IV Piggyback:1681] Out: 487 [Urine:487]   ROS Unable to complete balance of 12 systems given neurological status  PMH History reviewed. No pertinent past medical history. PSH  Past Surgical History:  Procedure Laterality Date   RADIOLOGY WITH ANESTHESIA N/A 2022-10-01   Procedure: IR WITH ANESTHESIA;  Surgeon: Lisbeth Renshaw, MD;  Location: [facility] OR;  Service: Radiology;  Laterality: N/A;   FH History reviewed. No pertinent family history. SH  has no history on file for tobacco use, alcohol use, and drug use. Allergies Not on  File Home medications Prior to Admission medications   Not on File    Current Medications Scheduled Meds:  Chlorhexidine Gluconate Cloth  6 each Topical Daily   docusate  100 mg Per Tube BID   feeding supplement (PROSource TF20)  60 mL Per Tube Daily   heparin injection (subcutaneous)  5,000 Units Subcutaneous Q8H   insulin aspart  0-15 Units Subcutaneous Q4H   metoprolol tartrate  25 mg Per Tube BID   niMODipine  60 mg Oral Q4H   Or   niMODipine  60 mg Per Tube Q4H   mouth rinse  15 mL Mouth Rinse Q2H   pantoprazole  40 mg Per Tube Daily   polyethylene glycol  17 g Per Tube Daily   Continuous Infusions:  ampicillin-sulbactam (UNASYN) IV     clevidipine Stopped (09/03/22 0223)   feeding supplement (NEPRO CARB STEADY) 55 mL/hr at 09/04/22 0700   fentaNYL infusion INTRAVENOUS 200 mcg/hr (09/04/22 1041)   propofol (DIPRIVAN) infusion 40 mcg/kg/min (09/04/22 1042)   PRN Meds:.acetaminophen **OR** acetaminophen (TYLENOL) oral liquid 160 mg/5 mL **OR** acetaminophen, fentaNYL, mouth rinse  CBC Recent Labs  Lab 10/01/2022 0900 October 01, 2022 0908 09/02/22 1058 09/02/22 1134 09/03/22 0626 09/03/22 1551 09/04/22 0150 09/04/22 0413  WBC 70.5*  --  61.8*  --  75.3*  --  59.0*  --   NEUTROABS 9.0*  --   --   --   --   --   --   --   HGB 15.5   < > 14.6   < > 13.9 14.3 12.9* 13.6  HCT 51.2   < > 44.3   < > 45.1 42.0 43.4 40.0  MCV 97.0  --  93.1  --  97.2  --  100.9*  --   PLT 212  --  193  --  185  --  160  --    < > = values in this interval not displayed.   Basic Metabolic Panel Recent Labs  Lab Sep 25, 2022 0900 September 25, 2022 0908 Sep 25, 2022 1122 09/02/22 1058 09/02/22 1134 09/02/22 1808 09/03/22 0020 09/03/22 0626 09/03/22 1545 09/03/22 1551 09/04/22 0150 09/04/22 0413  NA 138 138   < > 139 144  --  140 139 138 139 139 136  K 4.3 3.6   < > 5.0 3.3*  --  4.1 5.4* 4.8 4.3 5.1 4.4  CL 102 100  --  105  --   --   --  105 105  --  103  --   CO2 23  --   --  25  --   --   --  23 23   --  21*  --   GLUCOSE 163* 162*  --  134*  --   --   --  150* 148*  --  154*  --   BUN 11 12  --  26*  --   --   --  52* 59*  --  70*  --   CREATININE 1.05 0.90  --  1.49*  --   --   --  3.58* 4.46*  --  5.73*  --   CALCIUM 8.9  --   --  8.1*  --   --   --  8.6* 7.7*  --  8.4*  --   PHOS  --   --   --  4.5  --  5.9*  --  7.4*  --   --  10.1*  --    < > = values in this interval not displayed.    Physical Exam  Blood pressure 136/68, pulse 63, temperature 98.8 F (37.1 C), resp. rate (!) 35, height 5\' 7"  (1.702 m), weight 127.8 kg, SpO2 97 %. GEN: African-American male, middle-aged, intubated, not awake or responsive ENT: ET tube in oropharynx CV: Regular, normal S1 and S2 PULM: Coarse breath sounds bilaterally ABD: Soft, nontender GU: Foley catheter in place SKIN: No rashes or lesions EXT: Trace edema bilaterally  Assessment Male of unk age admitted with AMS, intraparenchymal hemorrhage secondary to ruptured aneurysm status post coiling, hypertensive emergency and now with AKI.  AKI, likely ATN 2/2 contrast + VDRF Normal baselin GFR No renal imaging, has foley UA with pyuria and hematuria at admission, ? foley Progressive oliguria Likely to require RRT Intraparenchymal hemorrhage secondary to ruptured aneurysm s/p coiling, per neurosurgery VDRF/ARDS secondary to aspiration with pneumonia on Unasyn, per CCM Persistent lymphocytosis, concern for underlying hematological malignancy Hypertensive emergency, on nimodipine, blood pressures improved Unknown identity; Reportedly homeless  Plan Lasix challenge 160 IV today Repeat UA, check UP/C High risk for requiring RRT Follow closely Daily Renal Panel, Strict I/Os, Avoid nephrotoxins (NSAIDs, judicious IV Contrast)   Rexene Agent, MD  09/04/2022, 11:07 AM

## 2022-09-04 NOTE — Progress Notes (Signed)
Patient ID: Dustin Jones, male   DOB: 12/05/1875, 61 y.o.   MRN: [id number] BP (!) 164/71   Pulse 72   Temp 98.8 F (37.1 C)   Resp (!) 35   Ht 5\' 7"  (1.702 m)   Wt 127.8 kg   SpO2 98%   BMI 44.13 kg/m  Not responsive. Intubated, sedated +cough, +corneals Perrl, small pupils No change since yesterday. Has acute kidney injury, pneumonia Aneurysm coiled.  Multiple medical issues being addressed.

## 2022-09-04 NOTE — Progress Notes (Signed)
eLink Physician-Brief Progress Note Patient Name: [name] DOB: 12/05/1875 MRN: [id number]   Date of Service  09/04/2022  HPI/Events of Note  Notified of urine output of 103ml over the last hour.  It appears pt has been oliguric throughout the day. Creatinine uptrending as well.  He has been given multiple fluid  boluses without much response.  K 5.1, requiring FiO2 100%, PEEP 16  eICU Interventions  Will hold off further boluses given high O2 requirement.  Would need nephrology evaluation in AM.  Will give trial of lasix in the meantime        Regina 09/04/2022, 3:02 AM

## 2022-09-04 NOTE — Progress Notes (Signed)
Pharmacy Antibiotic Note  Dustin Jones is a 61 y.o. male admitted on September 15, 2022 and now found to have MSSA PNA. Pharmacy has been consulted for Cefazolin dosing.  Noted AKI with minimal UOP - will require a reduction in dose for this.   Plan: - D/c Unasyn - Start Cefazolin 1g IV every 12 hours - Will continue to follow renal function, culture results, LOT, and antibiotic de-escalation plans   Height: 5\' 7"  (170.2 cm) Weight: 127.8 kg (281 lb 12 oz) IBW/kg (Calculated) : 66.1  Temp (24hrs), Avg:98.9 F (37.2 C), Min:98.6 F (37 C), Max:99.3 F (37.4 C)  Recent Labs  Lab 09/15/2022 0900 2022-09-15 0908 09/02/22 1058 09/03/22 0626 09/03/22 1545 09/04/22 0150  WBC 70.5*  --  61.8* 75.3*  --  59.0*  CREATININE 1.05 0.90 1.49* 3.58* 4.46* 5.73*    Estimated Creatinine Clearance: -1.3 mL/min (A) (by C-G formula based on SCr of 5.73 mg/dL (H)).    Not on File  Antimicrobials this admission: Unasyn 9/29 >> 10/1 Cefazolin 10/1 >>  Dose adjustments this admission: N/a  Microbiology results: 9/29 RCx >> MSSA  Thank you for allowing pharmacy to be a part of this patient's care.  Alycia Rossetti, PharmD, BCPS Infectious Diseases Clinical Pharmacist 09/04/2022 3:08 PM   **Pharmacist phone directory can now be found on [email or web address] ([email or web address]).  Listed under [place].

## 2022-09-04 DEATH — deceased

## 2022-09-05 ENCOUNTER — Inpatient Hospital Stay (HOSPITAL_COMMUNITY): Payer: Medicaid Other

## 2022-09-05 DIAGNOSIS — I639 Cerebral infarction, unspecified: Secondary | ICD-10-CM

## 2022-09-05 LAB — POCT I-STAT 7, (LYTES, BLD GAS, ICA,H+H)
Acid-Base Excess: 2 mmol/L (ref 0.0–2.0)
Acid-base deficit: 6 mmol/L — ABNORMAL HIGH (ref 0.0–2.0)
Acid-base deficit: 2 mmol/L (ref 0.0–2.0)
Bicarbonate: 22.4 mmol/L (ref 20.0–28.0)
Bicarbonate: 26 mmol/L (ref 20.0–28.0)
Bicarbonate: 29.6 mmol/L — ABNORMAL HIGH (ref 20.0–28.0)
Calcium, Ion: 0.97 mmol/L — ABNORMAL LOW (ref 1.15–1.40)
Calcium, Ion: 0.98 mmol/L — ABNORMAL LOW (ref 1.15–1.40)
Calcium, Ion: 1.04 mmol/L — ABNORMAL LOW (ref 1.15–1.40)
HCT: 37 % — ABNORMAL LOW (ref 39.0–52.0)
HCT: 35 % — ABNORMAL LOW (ref 39.0–52.0)
HCT: 36 % — ABNORMAL LOW (ref 39.0–52.0)
Hemoglobin: 12.6 g/dL — ABNORMAL LOW (ref 13.0–17.0)
Hemoglobin: 11.9 g/dL — ABNORMAL LOW (ref 13.0–17.0)
Hemoglobin: 12.2 g/dL — ABNORMAL LOW (ref 13.0–17.0)
O2 Saturation: 98 %
O2 Saturation: 97 %
O2 Saturation: 95 %
Patient temperature: 37.2
Patient temperature: 99.3
Patient temperature: 37.4
Potassium: 3.6 mmol/L (ref 3.5–5.1)
Potassium: 3.5 mmol/L (ref 3.5–5.1)
Potassium: 3.8 mmol/L (ref 3.5–5.1)
Sodium: 144 mmol/L (ref 135–145)
Sodium: 142 mmol/L (ref 135–145)
Sodium: 141 mmol/L (ref 135–145)
TCO2: 24 mmol/L (ref 22–32)
TCO2: 28 mmol/L (ref 22–32)
TCO2: 31 mmol/L (ref 22–32)
pCO2 arterial: 57.1 mmHg — ABNORMAL HIGH (ref 32–48)
pCO2 arterial: 58.7 mmHg — ABNORMAL HIGH (ref 32–48)
pCO2 arterial: 63.2 mmHg — ABNORMAL HIGH (ref 32–48)
pH, Arterial: 7.203 — ABNORMAL LOW (ref 7.35–7.45)
pH, Arterial: 7.256 — ABNORMAL LOW (ref 7.35–7.45)
pH, Arterial: 7.28 — ABNORMAL LOW (ref 7.35–7.45)
pO2, Arterial: 137 mmHg — ABNORMAL HIGH (ref 83–108)
pO2, Arterial: 108 mmHg (ref 83–108)
pO2, Arterial: 92 mmHg (ref 83–108)

## 2022-09-05 LAB — PATHOLOGIST SMEAR REVIEW

## 2022-09-05 LAB — GLUCOSE, CAPILLARY
Glucose-Capillary: 183 mg/dL — ABNORMAL HIGH (ref 70–99)
Glucose-Capillary: 179 mg/dL — ABNORMAL HIGH (ref 70–99)
Glucose-Capillary: 201 mg/dL — ABNORMAL HIGH (ref 70–99)
Glucose-Capillary: 182 mg/dL — ABNORMAL HIGH (ref 70–99)
Glucose-Capillary: 190 mg/dL — ABNORMAL HIGH (ref 70–99)
Glucose-Capillary: 216 mg/dL — ABNORMAL HIGH (ref 70–99)

## 2022-09-05 LAB — BASIC METABOLIC PANEL
Anion gap: 14 (ref 5–15)
BUN: 90 mg/dL — ABNORMAL HIGH (ref 8–23)
CO2: 24 mmol/L (ref 22–32)
Calcium: 8.2 mg/dL — ABNORMAL LOW (ref 8.9–10.3)
Chloride: 103 mmol/L (ref 98–111)
Creatinine, Ser: 5.23 mg/dL — ABNORMAL HIGH (ref 0.61–1.24)
GFR, Estimated: 7 mL/min — ABNORMAL LOW (ref >60)
Glucose, Bld: 166 mg/dL — ABNORMAL HIGH (ref 70–99)
Potassium: 4.3 mmol/L (ref 3.5–5.1)
Sodium: 141 mmol/L (ref 135–145)

## 2022-09-05 LAB — CBC
HCT: 42.3 % (ref 39.0–52.0)
Hemoglobin: 13.2 g/dL (ref 13.0–17.0)
MCH: 30.8 pg (ref 26.0–34.0)
MCHC: 31.2 g/dL (ref 30.0–36.0)
MCV: 98.6 fL (ref 80.0–100.0)
Platelets: 163 10*3/uL (ref 150–400)
RBC: 4.29 MIL/uL (ref 4.22–5.81)
RDW: 14.6 % (ref 11.5–15.5)
WBC: 57.5 10*3/uL (ref 4.0–10.5)
nRBC: 0 % (ref 0.0–0.2)

## 2022-09-05 LAB — TRIGLYCERIDES: Triglycerides: 376 mg/dL — ABNORMAL HIGH (ref <150)

## 2022-09-05 LAB — PHOSPHORUS: Phosphorus: 10.8 mg/dL — ABNORMAL HIGH (ref 2.5–4.6)

## 2022-09-05 LAB — MAGNESIUM: Magnesium: 3 mg/dL — ABNORMAL HIGH (ref 1.7–2.4)

## 2022-09-05 MED ORDER — OSMOLITE 1.5 CAL PO LIQD
1000.0000 mL | ORAL | Status: DC
  Administered 2022-09-05 – 2022-09-28 (×25): 1000 mL
  Filled 2022-09-05 (×10): qty 1000

## 2022-09-05 MED ORDER — SODIUM BICARBONATE 8.4 % IV SOLN
100.0000 meq | Freq: Once | INTRAVENOUS | Status: AC
  Administered 2022-09-05: 100 meq via INTRAVENOUS
  Filled 2022-09-05: qty 100
  Filled 2022-09-05: qty 50

## 2022-09-05 MED ORDER — HYDRALAZINE HCL 25 MG PO TABS
25.0000 mg | ORAL_TABLET | Freq: Three times a day (TID) | ORAL | Status: DC
  Administered 2022-09-05 – 2022-09-07 (×6): 25 mg
  Filled 2022-09-05 (×6): qty 1

## 2022-09-05 MED ORDER — PROSOURCE TF20 ENFIT COMPATIBL EN LIQD
60.0000 mL | Freq: Two times a day (BID) | ENTERAL | Status: DC
  Administered 2022-09-05 – 2022-10-06 (×60): 60 mL
  Filled 2022-09-05 (×60): qty 60

## 2022-09-05 MED ORDER — FUROSEMIDE 10 MG/ML IJ SOLN
80.0000 mg | Freq: Three times a day (TID) | INTRAMUSCULAR | Status: AC
  Administered 2022-09-05 (×3): 80 mg via INTRAVENOUS
  Filled 2022-09-05 (×3): qty 8

## 2022-09-05 NOTE — Progress Notes (Signed)
Pharmacy Antibiotic Note  Dustin Jones is a 61 y.o. male admitted on 09/21/22 and now found to have MSSA PNA. Pharmacy has been consulted for Cefazolin dosing.  Remains in AKI. Based on estimated age of 11 years, CrCl currently 20 mL/min. UOP 0.7 mL/kg/hr after receiving Lasix 160mg . Nephrology consulted.  Plan: Continue cefazolin 1g Q12H. 7-day stop date entered for 10/6 Will continue to monitor renal function for dose changes.  Height: 5\' 7"  (170.2 cm) Weight: 130.3 kg (287 lb 4.2 oz) IBW/kg (Calculated) : 66.1  Temp (24hrs), Avg:99 F (37.2 C), Min:98.6 F (37 C), Max:99.3 F (37.4 C)  Recent Labs  Lab 09-21-22 0900 September 21, 2022 0908 09/02/22 1058 09/03/22 0626 09/03/22 1545 09/04/22 0150 09/04/22 1629 09/05/22 0501  WBC 70.5*  --  61.8* 75.3*  --  59.0*  --  57.5*  CREATININE 1.05   < > 1.49* 3.58* 4.46* 5.73* 5.61* 5.23*   < > = values in this interval not displayed.     Estimated Creatinine Clearance: -1.5 mL/min (A) (by C-G formula based on SCr of 5.23 mg/dL (H)).    Not on File  Antimicrobials this admission: Unasyn 9/29 >> 10/1 Cefazolin 10/1 >> 10/6  Microbiology results: 9/29 RCx >> MSSA  Thank you for allowing pharmacy to be a part of this patient's care.  Erskine Speed, PharmD Clinical Pharmacist 09/05/2022 10:54 AM

## 2022-09-05 NOTE — Progress Notes (Signed)
SLP Cancellation Note  Patient Details Name: Dustin Jones MRN: [id number] DOB: 12/05/1875   Cancelled treatment:       Reason Eval/Treat Not Completed: Patient not medically ready   Trachelle Low, Katherene Ponto 09/05/2022, 8:38 AM

## 2022-09-05 NOTE — Progress Notes (Signed)
Nutrition Follow-up  DOCUMENTATION CODES:   Obesity unspecified  INTERVENTION:   Tube feeding via OG tube: D/C Nepro  Osmolite 1.5 at 55 ml/h (1320 ml per day) Prosource TF20 60 ml BID  Provides 2140 kcal, 122 gm protein, 1003 ml free water daily   NUTRITION DIAGNOSIS:   Inadequate oral intake related to inability to eat as evidenced by NPO status. Ongoing.   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   Pt discussed during ICU rounds and with RN and MD.  Pt on vent support with fiO2 of 60%, pt with ARDS and suspected aspiration PNA.  Per Renal  pt with AKI but no plans for RRT at this time. Plan for lasix challenge.  Per MD pt with severe leukocytosis/lymphocytosis with suspected underlying hematological malignancy.   9/29 TF started 9/30 TF changed to Nepro providing: 2456 kcal and 127 grams of protein    Medications reviewed and include: colace, lasix 80 mg TID, SSI, nimotop, protonix, miralax  Cleviprex @ 16 ml/hr provides: 768 kcal  Na bicarb @ 75 ml/hr   Labs reviewed: PO4 10.8 trending up  WBC: 57.5 trending down  A1C: 6.5 CBG: 179-201  16 F OG tube; per xray tube is gastric  UOP: 2210 ml    Diet Order:   Diet Order             Diet NPO time specified  Diet effective now                   EDUCATION NEEDS:   No education needs have been identified at this time  Skin:  Skin Assessment: Skin Integrity Issues: Skin Integrity Issues:: Stage II Stage II: R leg (pretibial) - old healing ulcer  Last BM:  200 ml via rectal tube + 3 occurances x 24 hours  Height:   Ht Readings from Last 1 Encounters:   08/09/2022 _0  (1.702 m)    Weight:   Wt Readings from Last 1 Encounters:  09/05/22 130.3 kg    BMI:  Body mass index is 44.99 kg/m.  Estimated Nutritional Needs:   Kcal:  2000-2200  Protein:  100-120 grams  Fluid:  >2 L/day  Lockie Pares., RD, LDN, CNSC See AMiON for contact information

## 2022-09-05 NOTE — Progress Notes (Signed)
VASCULAR LAB    TCD has been performed.  See CV proc for preliminary results.   Mandeep Ferch, RVT 09/05/2022, 3:09 PM

## 2022-09-05 NOTE — Progress Notes (Signed)
PT Cancellation Note  Patient Details Name: [name] MRN: [id number] DOB: 12/05/1875   Cancelled Treatment:    Reason Eval/Treat Not Completed: Patient not medically ready; remains on hi vent support, paralytics and now dealing with possible CRRT needs.  PT will sign off and anticipate new order when stable.    Reginia Naas 09/05/2022, 9:05 AM Magda Kiel, PT Acute Rehabilitation Services Office:[phone] 09/05/2022

## 2022-09-05 NOTE — Progress Notes (Addendum)
[facility] Progress Note Patient Name: Dustin Jones DOB: 1961-04-07 MRN: [id number]   Date of Service  09/05/2022  HPI/Events of Note  Notified of ABG result - 7.280/63.2/92 on TV 500, rate 25, PEEP 10, 60% FiO2.  Peak pressure at 25.  CXR showed ETT 5cm above the carina.    eICU Interventions  Increase tidal volume to 530 and repeat ABG tonight.      Intervention Category Intermediate Interventions: Diagnostic test evaluation  Elsie Lincoln 09/05/2022, 8:38 PM  12:26 AM ABG 7.32/54.6/85. Ionized calcium at 1.01, K 3.4. Crea 5.23.   Plan> Continue on current vent settings.  Right wrist restrains renewed per request.  Replete calcium.   Will await BMP before correcting K.    5:29 AM Repeat ABG 7.296/60.8/83.  TV increased from 500 to 530.  Peak pressure 27. Ionized calcium 1.06.   Plan> Increase tidal volume further to 550.  Calcium gluconate 1g IV ordered.

## 2022-09-05 NOTE — Progress Notes (Addendum)
NAME:  Amadeo Garnet, MRN:  [id number], DOB:  12/05/1875, LOS: 4 ADMISSION DATE:  09/29/2022, CONSULTATION DATE:  09-29-2022 REFERRING MD:  Anitra Lauth, MD, CHIEF COMPLAINT:  Intracranial hemorrhage   History of Present Illness:  Patient with an unknown PMH  who is reportedly homeless presented to the [facility] ED as a code stroke this morning. He was last seen walking around at 3 am. Found down this morning at 8 am by bystanders who called 911. EMS reported the pt had L-sided deficits and AMS. Systolic BP reported to be in the 190's per EMS. CT imaging in ED revealed acute intraparenchymal hemorrhage with possible lobulated 18mm outpouching arising from the supraclinoid right ICA that is suspicious for ruptured aneurysm. Was admitted by Neurosurgery for close monitoring and further diagnostic testing. CCM has been consulted for ventilator management.   Pertinent  Medical History  No past medical history on file.   Significant Hospital Events: Including procedures, antibiotic start and stop dates in addition to other pertinent events   9/28 admitted. Intracranial Hemorrhage/ R MCA ruptured aneurysm Non-con CT of head demonstrating large acute hemorrhage in the R lentiform nucleus with with intraventricular extension, midline shift, and slight rounding of the temporal horns, possibly representing early hydrocephalus. On CTA, pt found to have a R MCA ruptured aneurysm. Neurosurgery consulted and following with plans for possible arterial clipping this evening. CT of the face and cervical spine did not show any acute traumatic injuries, though BL cervical lymphadenopathy was noted. Echo with normal biventricular function without evidence of hemodynamically sig valvular heart disease.  9/28 complete echo with LVEF of 60-65%, no regional wall motion abnormalities but with grade 1 diastolic dysfunction.  Normal right ventricular systolic function.  No valvular abnormalities. 9/29 MRI brain 1. Large acute  intraparenchymal hemorrhage centered in the rightlentiform nucleus, with extension into the right-greater-than-left lateral ventricle, third ventricle, and fourth ventricle, overall similar to the prior CT. Mild mass effect and 5 mm of right-to-left midline shift. No hydrocephalus. 2. Small areas of acute infarct in the bilateral occipital lobes, bilateral frontal lobes, and right parietal lobe. Spiking fever. Has sig leukocytosis. Cultures sent. Unasyn started for aspiration  9/29 respiratory culture positive for MSSA 9/29 > self extubated, required reintubation  Interim History / Subjective:  Increased urine output status post 160 mg Lasix challenge yesterday, currently 0.7 cc/KG/hour with a total output of 2.2 L over the past 24 hours.  Patient started with loose stools, tolerating tube feeds at 55 mL/hour. Patient was persistently acidotic overnight despite bicarb gtt, overnight provider ordered an additional 2 ampoules of bicarb.  Lowered FiO2 needs, but still with elevated vent needs: 0.70, PEEP 10, rate 35, tidal volume 400 mL. Propofol discontinued given elevated triglycerides, patient on midazolam gtt. for sedation.  Cleviprex restarted given persistent hypertension   Objective   Blood pressure (!) 140/72, pulse 75, temperature 99.1 F (37.3 C), resp. rate (!) 25, height 5\' 7"  (1.702 m), weight 130.3 kg, SpO2 95 %.    Vent Mode: PRVC FiO2 (%):  [70 %-80 %] 70 % Set Rate:  [35 bmp] 35 bmp Vt Set:  [400 mL] 400 mL PEEP:  [10 cmH20-14 cmH20] 10 cmH20 Plateau Pressure:  [22 cmH20-26 cmH20] 26 cmH20   Intake/Output Summary (Last 24 hours) at 09/05/2022 0846 Last data filed at 09/05/2022 0802 Gross per 24 hour  Intake 3464.56 ml  Output 2470 ml  Net 994.56 ml    Filed Weights   2022/09/29 1009 09/04/22 0155 09/05/22 0453  Weight: 100 kg 127.8 kg 130.3 kg    Examination:  General: large body habitus, intubated, laying towards left side HEENT:  ET tube in place, moist, somewhat  enlarged tongue, no lesions, lateral left eye scleral extrusion Pulmonary:  Coarse apical breath sounds bilaterally, diminished breath sounds in lower lung fields bilaterally Cardiac: regular rate and rhythm, no murmur Abdomen: obese, slightly distended, soft, paraumbilical hernia Extremities 1+ peripheral edema Neuo: sedated, moves right side when turned   Resolved Hospital Problem list   Hypertensive emergency   Assessment & Plan:  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 c/b R>L IVH (no hydrocephalus)  Mild acute encephalopathy  Patient has new onset left eye sclera extrusion that is likely fluid dependent on body positioning. Pupils at 22mm, delayed reflex, symmetric. Neuro exam limited with augmentation of Midazolam drip 2/2 to increased plateau pressure.  Per nursing patient only uses right side when agitated (noticed when turning and with oral hygiene care). Cleviprex restarted given elevated BP.  -Serial neurochecks -SBP goal 130-150  -Continue with heparin for DVT prophylaxis -Continue enteral metoprolol -Continue scheduled nimodipine  Acute hypoxic respiratory failure w/ aspiration PNA/ ALI Self extubation 9/29, required reintubation Respiratory acidosis Improved oxygenation status; however, patient is persistently with respiratory acidosis and has high ventilator needs. Vent setting modified at bedside to 0.65, PEEP 10, RR 25, Tidal Volume 5108ml for ARDS precautions. Height measured at 5'8. Triglycerides at 376; propofol initially stopped due to triglyceride level greater than 800 when initially checked. -Wean PEEP and FiO2 as able.   -Continue cefazolin 1gram q12hrs for a total of 7 days -Continue bicarb gtt, but with decreased rate -Continue midazolam gtt. for sedation -Follow CXR, and ABG status post vent changes, -PAD protocol, requiring deeper sedation at this time given ventilator needs.     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  Patient is HIV negative currently on day 4/7 of antibiotics, unasyn transitioned to cefazolin given MSSA found on tracheal aspirate. Dosing and regimen per pharmacy.  Plan -Heme/onc consulted, appreciate ongoing recs -Flow cytometry per heme-onc recommendations -Continue cefazolin 1 g every 12 hours for a total of 7 days -Trend CBC  AKI Likely ATN 2/2 to contrast + VDRF, currently not on CRRT. Has responded well to 160 mg Lasix challenge, with 2.2 L of UOP/24 hours at 0.7 cc/KG/hour.  Repeat UA on 10/1 improved, but still with hematuria, and elevated WBCs in urine.  UPC elevated to 0.49. Plan -Nephro consulted, appreciate ongoing recommendations -Lasix 80 mg TID, per nephro  -Daily renal panel -Strict I's and O's -Avoid nephrotoxins -Decrease obligate IV fluid input as much as possible  Hyperglycemia  -CBG, with goal of 140-180 -Sliding scale insulin  Bowel motility -Scale back bowel regimen given frequency of loose stools   Best Practice (right click and "Reselect all SmartList Selections" daily)   Diet/type: tubefeeds DVT prophylaxis: systemic heparin GI prophylaxis: PPI Lines: Arterial Line and yes and it is still needed Foley:  Yes, and it is still needed Code Status:  full code  Damaris Schooner MS4, [facility]

## 2022-09-05 NOTE — Anesthesia Postprocedure Evaluation (Signed)
Anesthesia Post Note  Patient: Dustin Jones  Procedure(s) Performed: IR WITH ANESTHESIA     Patient location during evaluation: SICU Anesthesia Type: General Level of consciousness: sedated Pain management: pain level controlled Vital Signs Assessment: post-procedure vital signs reviewed and stable Respiratory status: patient remains intubated per anesthesia plan Cardiovascular status: stable Postop Assessment: no apparent nausea or vomiting Anesthetic complications: no   No notable events documented.  Last Vitals:  Vitals:   09/05/22 0740 09/05/22 0800  BP:  (!) 140/72  Pulse: 78 75  Resp: (!) 35 (!) 25  Temp:  37.3 C  SpO2: 95% 95%    Last Pain:  Vitals:   09/04/22 2000  TempSrc: Bladder  PainSc:                  Dustin Jones

## 2022-09-05 NOTE — Progress Notes (Signed)
Patient ID: Dustin Jones, male   DOB: 12/05/1875, 61 y.o.   MRN: [id number] [facility] Progress Note   Assessment/ Plan:   1. Acute kidney Injury: Suspected ATN with intercurrent injury from contrast exposure.  Decent response to diuretic overnight with documented 2.2 L urine output after bolus furosemide dose earlier.  Creatinine slightly better this morning.  Will begin scheduled furosemide 80 mg 3 times a day for volume unloading.  No acute indications for dialysis noted. 2.  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 3.  Ventilator dependent respiratory failure: With ARDS and suspected aspiration pneumonia.  On management with Unasyn.  Increase rate of sodium bicarbonate drip to assist with compensation of respiratory acidosis. 4.  Severe leukocytosis/lymphocytosis: Suspected underlying hematological malignancy with left shift in the setting of aspiration pneumonia.  Subjective:   Arterial blood gas earlier this morning showed respiratory acidosis with inadequate metabolic compensation for which he is on bicarbonate drip.   Objective:   BP (!) 140/60 (BP Location: Right Arm)   Pulse 74   Temp 99.3 F (37.4 C)   Resp (!) 25   Ht 5\' 7"  (1.702 m)   Wt 130.3 kg   SpO2 97%   BMI 44.99 kg/m   Intake/Output Summary (Last 24 hours) at 09/05/2022 1017 Last data filed at 09/05/2022 0802 Gross per 24 hour  Intake 3282.36 ml  Output 2435 ml  Net 847.36 ml   Weight change: 2.5 kg  Physical Exam: Gen: Intubated, unresponsive CVS: Pulse regular rhythm, normal rate, S1 and S2 normal Resp: Anteriorly coarse breath sounds bilaterally without distinct rales or rhonchi, on ventilator Abd: Firm, moderately generalized protuberance, tympanitic on exam.  Bowel sounds normal Ext: Trace lower extremity edema  Imaging: DG Chest Port 1 View  Result Date:  09/05/2022 CLINICAL DATA:  Respiratory failure. EXAM: PORTABLE CHEST 1 VIEW COMPARISON:  09/04/2022 FINDINGS: 0528 hours. Low volume film. The cardio pericardial silhouette is enlarged. Endotracheal tube tip is approximately 5.0 cm above the base of the carina. The NG tube passes into the stomach although the distal tip position is not included on the film. There is pulmonary vascular congestion with probable interstitial edema. Left greater than right basilar atelectasis again noted with probable small left effusion. Telemetry leads overlie the chest. IMPRESSION: 1. Low volume film with pulmonary vascular congestion and probable interstitial edema. 2. Left greater than right basilar atelectasis with probable small left effusion. Electronically Signed   By: Misty Stanley M.D.   On: 09/05/2022 06:26   DG Chest Port 1 View  Result Date: 09/04/2022 CLINICAL DATA:  Respiratory failure EXAM: PORTABLE CHEST 1 VIEW COMPARISON:  09/02/2022 FINDINGS: Endotracheal tube 5 cm from carina. NG tube extends the stomach. LEFT basilar atelectasis and small effusions similar prior. No pneumothorax. No pulmonary edema IMPRESSION: 1. No significant change. 2. Stable support apparatus. 3. LEFT basilar atelectasis and effusion. Electronically Signed   By: Suzy Bouchard M.D.   On: 09/04/2022 09:30    Labs: BMET Recent Labs  Lab 2022-09-19 0900 2022-09-19 0908 09-19-22 1122 09/02/22 1058 09/02/22 1134 09/02/22 1808 09/03/22 0020 09/03/22 0626 09/03/22 1545 09/03/22 1551 09/04/22 0150 09/04/22 0413 09/04/22 1223 09/04/22 1548 09/04/22 1629 09/04/22 2037 09/05/22 0411 09/05/22 0501  NA 138 138   < > 139   < >  --    < > 139 138   < > 139 136 139 138 141 139 144 141  K 4.3 3.6   < > 5.0   < >  --    < > 5.4* 4.8   < > 5.1 4.4 4.4 4.4 4.9 4.5 3.6 4.3  CL 102 100  --  105  --   --   --  105 105  --  103  --   --   --  104  --   --  103  CO2 23  --   --  25  --   --   --  23 23  --  21*  --   --   --  23  --   --   24  GLUCOSE 163* 162*  --  134*  --   --   --  150* 148*  --  154*  --   --   --  146*  --   --  166*  BUN 11 12  --  26*  --   --   --  52* 59*  --  70*  --   --   --  81*  --   --  90*  CREATININE 1.05 0.90  --  1.49*  --   --   --  3.58* 4.46*  --  5.73*  --   --   --  5.61*  --   --  5.23*  CALCIUM 8.9  --   --  8.1*  --   --   --  8.6* 7.7*  --  8.4*  --   --   --  8.3*  --   --  8.2*  PHOS  --   --   --  4.5  --  5.9*  --  7.4*  --   --  10.1*  --   --   --   --   --   --  10.8*   < > = values in this interval not displayed.   CBC Recent Labs  Lab Sep 26, 2022 0900 2022-09-26 0908 09/02/22 1058 09/02/22 1134 09/03/22 0626 09/03/22 1551 09/04/22 0150 09/04/22 0413 09/04/22 1548 09/04/22 2037 09/05/22 0411 09/05/22 0501  WBC 70.5*  --  61.8*  --  75.3*  --  59.0*  --   --   --   --  57.5*  NEUTROABS 9.0*  --   --   --   --   --   --   --   --   --   --   --   HGB 15.5   < > 14.6   < > 13.9   < > 12.9*   < > 13.3 13.9 12.6* 13.2  HCT 51.2   < > 44.3   < > 45.1   < > 43.4   < > 39.0 41.0 37.0* 42.3  MCV 97.0  --  93.1  --  97.2  --  100.9*  --   --   --   --  98.6  PLT 212  --  193  --  185  --  160  --   --   --   --  163   < > = values in this interval not displayed.    Medications:     Chlorhexidine Gluconate Cloth  6 each Topical Daily   docusate  100 mg Per Tube BID   feeding supplement (PROSource TF20)  60 mL Per Tube Daily   heparin injection (subcutaneous)  5,000 Units Subcutaneous Q8H   insulin aspart  0-15 Units Subcutaneous Q4H   metoprolol tartrate  25 mg Per Tube BID   niMODipine  60 mg Oral Q4H   Or   niMODipine  60 mg Per Tube Q4H   mouth rinse  15 mL Mouth Rinse Q2H   pantoprazole  40 mg Per Tube Daily   polyethylene glycol  17 g Per Tube Daily    Zetta Bills, MD 09/05/2022, 10:17 AM

## 2022-09-05 NOTE — Progress Notes (Signed)
eLink Physician-Brief Progress Note Patient Name: Dustin Jones DOB: 12/05/1875 MRN: [id number]   Date of Service  09/05/2022  HPI/Events of Note  Pt remains with uncompesated respiratory acidosis.  On max vent settings. Already on bicarb drip  eICU Interventions  Placed order to give 2 more amps sodium bicarbonate.         [place] 09/05/2022, 5:35 AM

## 2022-09-05 NOTE — Progress Notes (Signed)
  OT Sign off Note  Patient Details Name: NEWEL OIEN MRN: [id number] DOB: [birth year]   Cancelled Treatment:    Reason Eval/Treat Not Completed: Patient not medically ready (not medically ready; remains on hi vent support, paralytics and now dealing with possible CRRT needs.)  Jeri Modena 09/05/2022, 2:48 PM

## 2022-09-06 ENCOUNTER — Inpatient Hospital Stay (HOSPITAL_COMMUNITY): Payer: Medicaid Other

## 2022-09-06 DIAGNOSIS — J9602 Acute respiratory failure with hypercapnia: Secondary | ICD-10-CM

## 2022-09-06 LAB — TRIGLYCERIDES: Triglycerides: 197 mg/dL — ABNORMAL HIGH (ref <150)

## 2022-09-06 LAB — POCT I-STAT 7, (LYTES, BLD GAS, ICA,H+H)
Acid-Base Excess: 1 mmol/L (ref 0.0–2.0)
Acid-Base Excess: 2 mmol/L (ref 0.0–2.0)
Acid-Base Excess: 3 mmol/L — ABNORMAL HIGH (ref 0.0–2.0)
Bicarbonate: 27.1 mmol/L (ref 20.0–28.0)
Bicarbonate: 28.1 mmol/L — ABNORMAL HIGH (ref 20.0–28.0)
Bicarbonate: 29.6 mmol/L — ABNORMAL HIGH (ref 20.0–28.0)
Calcium, Ion: 1.01 mmol/L — ABNORMAL LOW (ref 1.15–1.40)
Calcium, Ion: 1.03 mmol/L — ABNORMAL LOW (ref 1.15–1.40)
Calcium, Ion: 1.06 mmol/L — ABNORMAL LOW (ref 1.15–1.40)
HCT: 31 % — ABNORMAL LOW (ref 39.0–52.0)
HCT: 32 % — ABNORMAL LOW (ref 39.0–52.0)
HCT: 35 % — ABNORMAL LOW (ref 39.0–52.0)
Hemoglobin: 10.5 g/dL — ABNORMAL LOW (ref 13.0–17.0)
Hemoglobin: 10.9 g/dL — ABNORMAL LOW (ref 13.0–17.0)
Hemoglobin: 11.9 g/dL — ABNORMAL LOW (ref 13.0–17.0)
O2 Saturation: 94 %
O2 Saturation: 94 %
O2 Saturation: 95 %
Patient temperature: 37.2
Patient temperature: 37.6
Patient temperature: 99.3
Potassium: 3.4 mmol/L — ABNORMAL LOW (ref 3.5–5.1)
Potassium: 3.4 mmol/L — ABNORMAL LOW (ref 3.5–5.1)
Potassium: 3.6 mmol/L (ref 3.5–5.1)
Sodium: 141 mmol/L (ref 135–145)
Sodium: 142 mmol/L (ref 135–145)
Sodium: 142 mmol/L (ref 135–145)
TCO2: 28 mmol/L (ref 22–32)
TCO2: 30 mmol/L (ref 22–32)
TCO2: 31 mmol/L (ref 22–32)
pCO2 arterial: 40.5 mmHg (ref 32–48)
pCO2 arterial: 54.6 mmHg — ABNORMAL HIGH (ref 32–48)
pCO2 arterial: 60.8 mmHg — ABNORMAL HIGH (ref 32–48)
pH, Arterial: 7.296 — ABNORMAL LOW (ref 7.35–7.45)
pH, Arterial: 7.323 — ABNORMAL LOW (ref 7.35–7.45)
pH, Arterial: 7.435 (ref 7.35–7.45)
pO2, Arterial: 69 mmHg — ABNORMAL LOW (ref 83–108)
pO2, Arterial: 83 mmHg (ref 83–108)
pO2, Arterial: 85 mmHg (ref 83–108)

## 2022-09-06 LAB — CBC WITH DIFFERENTIAL/PLATELET
Abs Immature Granulocytes: 0 10*3/uL (ref 0.00–0.07)
Basophils Absolute: 0 10*3/uL (ref 0.0–0.1)
Basophils Relative: 0 %
Eosinophils Absolute: 0.4 10*3/uL (ref 0.0–0.5)
Eosinophils Relative: 1 %
HCT: 34.8 % — ABNORMAL LOW (ref 39.0–52.0)
Hemoglobin: 11.4 g/dL — ABNORMAL LOW (ref 13.0–17.0)
Lymphocytes Relative: 80 %
Lymphs Abs: 31.6 10*3/uL — ABNORMAL HIGH (ref 0.7–4.0)
MCH: 31.1 pg (ref 26.0–34.0)
MCHC: 32.8 g/dL (ref 30.0–36.0)
MCV: 94.8 fL (ref 80.0–100.0)
Monocytes Absolute: 0.4 10*3/uL (ref 0.1–1.0)
Monocytes Relative: 1 %
Neutro Abs: 7.1 10*3/uL (ref 1.7–7.7)
Neutrophils Relative %: 18 %
Platelets: 143 10*3/uL — ABNORMAL LOW (ref 150–400)
RBC: 3.67 MIL/uL — ABNORMAL LOW (ref 4.22–5.81)
RDW: 14.3 % (ref 11.5–15.5)
WBC: 39.5 10*3/uL — ABNORMAL HIGH (ref 4.0–10.5)
nRBC: 0 % (ref 0.0–0.2)
nRBC: 0 /100 WBC

## 2022-09-06 LAB — RENAL FUNCTION PANEL
Albumin: 2.3 g/dL — ABNORMAL LOW (ref 3.5–5.0)
Anion gap: 16 — ABNORMAL HIGH (ref 5–15)
BUN: 104 mg/dL — ABNORMAL HIGH (ref 8–23)
CO2: 28 mmol/L (ref 22–32)
Calcium: 8.2 mg/dL — ABNORMAL LOW (ref 8.9–10.3)
Chloride: 99 mmol/L (ref 98–111)
Creatinine, Ser: 4.66 mg/dL — ABNORMAL HIGH (ref 0.61–1.24)
GFR, Estimated: 14 mL/min — ABNORMAL LOW (ref >60)
Glucose, Bld: 188 mg/dL — ABNORMAL HIGH (ref 70–99)
Phosphorus: 7.5 mg/dL — ABNORMAL HIGH (ref 2.5–4.6)
Potassium: 3.8 mmol/L (ref 3.5–5.1)
Sodium: 143 mmol/L (ref 135–145)

## 2022-09-06 LAB — SURGICAL PATHOLOGY

## 2022-09-06 LAB — GLUCOSE, CAPILLARY
Glucose-Capillary: 221 mg/dL — ABNORMAL HIGH (ref 70–99)
Glucose-Capillary: 148 mg/dL — ABNORMAL HIGH (ref 70–99)
Glucose-Capillary: 199 mg/dL — ABNORMAL HIGH (ref 70–99)
Glucose-Capillary: 186 mg/dL — ABNORMAL HIGH (ref 70–99)
Glucose-Capillary: 135 mg/dL — ABNORMAL HIGH (ref 70–99)

## 2022-09-06 LAB — MAGNESIUM: Magnesium: 2.7 mg/dL — ABNORMAL HIGH (ref 1.7–2.4)

## 2022-09-06 MED ORDER — CALCIUM GLUCONATE-NACL 1-0.675 GM/50ML-% IV SOLN
1.0000 g | Freq: Once | INTRAVENOUS | Status: AC
  Administered 2022-09-06: 1000 mg via INTRAVENOUS
  Filled 2022-09-06: qty 50

## 2022-09-06 MED ORDER — FUROSEMIDE 10 MG/ML IJ SOLN
100.0000 mg | Freq: Three times a day (TID) | INTRAVENOUS | Status: AC
  Administered 2022-09-06 (×3): 100 mg via INTRAVENOUS
  Filled 2022-09-06 (×3): qty 10

## 2022-09-06 MED ORDER — DOCUSATE SODIUM 50 MG/5ML PO LIQD: 100.0000 mg | Freq: Two times a day (BID) | ORAL | Status: DC | PRN

## 2022-09-06 MED ORDER — FENTANYL CITRATE (PF) 2500 MCG/50ML IJ SOLN
50.0000 ug/h | Status: DC
  Administered 2022-09-06 – 2022-09-10 (×5): 200 ug/h via INTRAVENOUS
  Administered 2022-09-11: 100 ug/h via INTRAVENOUS
  Filled 2022-09-06 (×7): qty 100

## 2022-09-06 MED ORDER — SODIUM BICARBONATE 650 MG PO TABS
1300.0000 mg | ORAL_TABLET | Freq: Three times a day (TID) | ORAL | Status: DC
  Administered 2022-09-06 – 2022-09-09 (×10): 1300 mg
  Filled 2022-09-06 (×10): qty 2

## 2022-09-06 MED ORDER — INSULIN ASPART 100 UNIT/ML IV SOLN
4.0000 [IU] | Freq: Four times a day (QID) | INTRAVENOUS | Status: DC
  Administered 2022-09-06 – 2022-09-07 (×8): 4 [IU] via SUBCUTANEOUS

## 2022-09-06 MED ORDER — POLYETHYLENE GLYCOL 3350 17 G PO PACK: 17.0000 g | PACK | Freq: Every day | ORAL | Status: DC | PRN

## 2022-09-06 MED ORDER — METOPROLOL TARTRATE 25 MG/10 ML ORAL SUSPENSION
50.0000 mg | Freq: Two times a day (BID) | ORAL | Status: DC
  Administered 2022-09-06 – 2022-09-14 (×15): 50 mg
  Filled 2022-09-06 (×15): qty 20

## 2022-09-06 NOTE — Progress Notes (Addendum)
NAME:  SAMIR ISHAQ, MRN:  [id number], DOB:  [birth year], LOS: 5 ADMISSION DATE:  09-05-22, CONSULTATION DATE:  09-05-22 REFERRING MD:  Maryan Rued, MD, CHIEF COMPLAINT:  Intracranial hemorrhage   History of Present Illness:  Patient with a mostly unknown medical history who originally presented to [facility] ED as a code stroke  He was last seen walking around at 3 am. Found down  at 8 am by bystanders who called 911. EMS reported the pt had L-sided deficits and AMS. Systolic BP reported to be in the 190's per EMS. CT imaging in ED revealed acute intraparenchymal hemorrhage with possible lobulated 70mm outpouching arising from the supraclinoid right ICA that is suspicious for ruptured aneurysm. Was admitted by Neurosurgery for close monitoring and further diagnostic testing. CCM was been consulted for ventilator management.   Pertinent  Medical History  Hypertension History of DVT Hx Etoh use, not quantified HX of Tobacco use, not quantified  Significant Hospital Events: Including procedures, antibiotic start and stop dates in addition to other pertinent events   9/28 admitted. Intracranial Hemorrhage/ R MCA ruptured aneurysm Non-con CT of head demonstrating large acute hemorrhage in the R lentiform nucleus with with intraventricular extension, midline shift, and slight rounding of the temporal horns, possibly representing early hydrocephalus. On CTA, pt found to have a R MCA ruptured aneurysm. Neurosurgery consulted and following with plans for possible arterial clipping this evening. CT of the face and cervical spine did not show any acute traumatic injuries, though BL cervical lymphadenopathy was noted. Echo with normal biventricular function without evidence of hemodynamically sig valvular heart disease.  9/28 complete echo with LVEF of 60-65%, no regional wall motion abnormalities but with grade 1 diastolic dysfunction.  Normal right ventricular systolic function.  No valvular abnormalities. 9/29  MRI brain 1. Large acute intraparenchymal hemorrhage centered in the rightlentiform nucleus, with extension into the right-greater-than-left lateral ventricle, third ventricle, and fourth ventricle, overall similar to the prior CT. Mild mass effect and 5 mm of right-to-left midline shift. No hydrocephalus. 2. Small areas of acute infarct in the bilateral occipital lobes, bilateral frontal lobes, and right parietal lobe. Spiking fever. Has sig leukocytosis. Cultures sent. Unasyn started for aspiration  9/29 respiratory culture positive for MSSA 9/29 self extubated, required reintubation 10/2 Family identified   Interim History / Subjective:  Patient continues to put out adequate amount of urine with 2.1L over the past 24 hours at 0.7 cc/kg/hr status post initial 160 mg lasix challenge on 10/1 and subsequent  80 mg lasix TID regimen on 10/2. Still ~7.4 L net fluid positive since admission with drips and tube feeds. Patient is tolerating tube feeds well, loose stools have improved.   Overnight provider paged due to ABG of 7.280/63.2/92 with vent settings of: TV 500, rate 25, PEEP 10, 60% FiO2.  Peak pressure at 25. Overnight provider increased TV to 530 and rechecked ABG, which initially improved to 7.32/54.6/85/28.1. TV was subsequently increased to 550 mL due to repeat ABG of  7.296/60.8/83/29.6  Transcranial VAS US Doppler obtained yesterday afternoon, but study was limited because there was difficulty in obtaining the left transtemporal window.   Family was identified, and came to see patient yesterday.  Per nursing, family reports the patient has a history of hypertension and history of DVT.  He reportedly takes antihypertensive medication, and anticoagulation at home.  Additionally, patient reportedly has a history of alcohol/tobacco use.  Objective   Blood pressure (!) 122/59, pulse 65, temperature 99.1 F (37.3 C), resp. rate Marland Kitchen)  25, height 5\' 7"  (1.702 m), weight 132.6 kg, SpO2 94 %.    Vent  Mode: PRVC FiO2 (%):  [60 %-70 %] 60 % Set Rate:  [25 bmp-35 bmp] 25 bmp Vt Set:  [400 mL-550 mL] 550 mL PEEP:  [10 cmH20] 10 cmH20 Plateau Pressure:  [19 cmH20-26 cmH20] 22 cmH20   Intake/Output Summary (Last 24 hours) at 09/06/2022 0652 Last data filed at 09/06/2022 0600 Gross per 24 hour  Intake 4455.3 ml  Output 2450 ml  Net 2005.3 ml    Filed Weights   09/04/22 0155 09/05/22 0453 09/06/22 0500  Weight: 127.8 kg 130.3 kg 132.6 kg    Examination:  General: large body habitus, intubated, laying towards left side, unresponsive to pain/verbal stimuli HEENT:  ET tube in place, moist, somewhat enlarged tongue, no lesions, lateral left eye scleral extrusion still present.  Pulmonary:  CTA in bilateral apical lung fields, diminished breath sounds in lower lung fields bilaterally Cardiac: regular rate and rhythm, no obvious murmurs/rubs/gallops appreciated Abdomen: obese, slightly distended, mildly tympanic, paraumbilical hernia Extremities 1+ peripheral edema in upper and lower extremities Neuo: sedated, moves right side when turned per nursing  Resolved Hospital Problem list   Hypertensive emergency   Assessment & Plan:  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 c/b R>L IVH (no hydrocephalus)  Mild acute encephalopathy  Pupils at 17mm, delayed reflex, symmetric. Neuro exam still limited given elevated midazolam needs.  Per nursing patient only uses right side when agitated (noticed when turning and with oral hygiene care).  -Continue cleviprex with plan to wean off as tolerated by BP -Increase enteral  lopressor to 50 mg BID -Continue scheduled nimodipine 60 mg per tube, q 4hrs -Continue hydralazine 25 mg q 8hrs -Serial neurochecks -SBP goal  <160  - Continue with heparin for DVT prophylaxis - Follow Transcranial VAS 3m results - Goals of care conversation with family  Acute hypoxic respiratory failure w/ aspiration PNA/ ALI Self extubation 9/29, required  reintubation Respiratory acidosis Patient continues with persistent respiratory acidosis. Vent settings changed at bedside to  PEEP 10, RR 30 Tidal Volume 10/29 for ARDS precautions. Plateau pressure rechecked and it was 23.  On day 5/7 of antibiotics.  Respiratory status likely influenced by overloaded fluid status ( + ~7.4L).  - Wean PEEP and FiO2 as able.   - Continue cefazolin 1gram q12hrs for a total of 7 day - Continue Midazolam for sedation - Start PO NAHCO3 1300mg  TID, Stop Bicarb Gtt to limit input volume per nephro recommendations - Decrease obligate input IV fluids as able  - Follow CXR, and ABG status post vent changes, - PAD protocol, requiring deeper sedation at this time given ventilator needs.    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  Patient is HIV negative currently on day 5/7 of antibiotics, unasyn transitioned to cefazolin given MSSA found on tracheal aspirate. Dosing and regimen per pharmacy.  -Heme/onc consulted, appreciate ongoing recs -Follow flow-cytometry -Continue cefazolin 1 g every 12 hours for a total of 7 days -Trend CBC  AKI Improving. Creatinine at 4.66 from 5.23 and responded well to lasix on 10/10 and 10/2, with UOP at 0.7 cc/kg/hr for the past 48 hours. Repeat UA on 10/1 improved, but still with hematuria, and elevated WBCs in urine.  UPC elevated to 0.49. Likely ATN 2/2 to contrast + VDRF.  - Nephro consulted, appreciate ongoing recommendations - Start PO NAHCO3  1300mg  TID, , Stop Bicarb Gtt to limit input volume per nephro recommendations - Lasix 100 mg TID - Daily renal panel - Replete electrolytes PRN - Strict I's and O's - Avoid nephrotoxins - Decrease obligate input IV fluids as able  Hyperglycemia  -CBG, with goal of 140-180 -Sliding scale insulin  Bowel motility - Bowel regimen PRN given  loose stools - Tube feeds at 53ml/hr, continue  Best Practice (right click and "Reselect all SmartList Selections" daily)   Diet/type: tubefeeds DVT prophylaxis: systemic heparin GI prophylaxis: PPI Lines: Arterial Line and yes and it is still needed Foley:  Yes, and it is still needed Code Status:  full code  51m MS4, [facility]

## 2022-09-06 NOTE — Progress Notes (Signed)
Patient ID: Dustin Jones, male   DOB: [birth year], 61 y.o.   MRN: [id number] [facility] Progress Note   Assessment/ Plan:   1. Acute kidney Injury: Suspected ATN with intercurrent injury from contrast exposure.  Decent response to diuretic overnight with documented 2.2 L urine output after bolus furosemide dose earlier.  Creatinine slightly better this morning and labs/clinical exam without need for acute HD/RRT. Diuretics adjusted by CCM and I will switch him from IV to PO NaHCO3 to limit volume.  2.  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 3.  Ventilator dependent respiratory failure: With ARDS and suspected aspiration pneumonia.  On management with Unasyn. Convert to PO HCO3 to limit volume.  4.  Severe leukocytosis/lymphocytosis: Suspected underlying hematological malignancy with left shift in the setting of aspiration pneumonia.  Subjective:   Good response to diuresis overnight with down-trending creatinine.    Objective:   BP (!) 122/59   Pulse 67   Temp 99.3 F (37.4 C)   Resp (!) 25   Ht 5\' 7"  (1.702 m)   Wt 132.6 kg   SpO2 94%   BMI 45.79 kg/m   Intake/Output Summary (Last 24 hours) at 09/06/2022 1011 Last data filed at 09/06/2022 1009 Gross per 24 hour  Intake 4396.86 ml  Output 2250 ml  Net 2146.86 ml   Weight change: 2.3 kg  Physical Exam: Gen: Intubated, unresponsive CVS: Pulse regular rhythm, normal rate, S1 and S2 normal Resp: Anteriorly coarse breath sounds bilaterally without distinct rales or rhonchi, on ventilator Abd: Firm, moderately generalized protuberance, tympanitic on exam.  Bowel sounds normal Ext: Trace-1+ lower extremity edema with 1-2+ upper extremity edema  Imaging: VAS [place] TRANSCRANIAL DOPPLER  Result Date: 09/06/2022  Transcranial Doppler Patient Name:  Dustin Jones  Date of Exam:   09/05/2022 Medical Rec #:  [id number]        Accession #:    [id number] Date of Birth: 1961-11-06        Patient Gender: M Patient Age:   61 years Exam Location:  [facility] Procedure:      VAS [place] TRANSCRANIAL DOPPLER Referring Phys: PRAMOD SETHI --------------------------------------------------------------------------------  Indications: ICH. Limitations: Ventilator, poor windows Limitations for diagnostic windows: Unable to insonate right transtemporal window. Unable to insonate left transtemporal window. Comparison Study: No prior study Performing Technologist: Sharion Dove RVS  Examination Guidelines: A complete evaluation includes B-mode imaging, spectral Doppler, color Doppler, and power Doppler as needed of all accessible portions of each vessel. Bilateral testing is considered an integral part of a complete examination. Limited examinations for reoccurring indications may be performed as noted.  +----------+-------------+----------+-----------+------------------+ RIGHT TCD Right VM (cm)Depth (cm)Pulsatility     Comment       +----------+-------------+----------+-----------+------------------+ MCA                                         unable to insonate +----------+-------------+----------+-----------+------------------+ ACA                                         unable to insonate +----------+-------------+----------+-----------+------------------+ Term ICA  unable to insonate +----------+-------------+----------+-----------+------------------+ PCA                                         unable to insonate +----------+-------------+----------+-----------+------------------+ Opthalmic     69.20                 2.08                       +----------+-------------+----------+-----------+------------------+ ICA siphon                                  unable to insonate +----------+-------------+----------+-----------+------------------+ Vertebral     -56.30                                            +----------+-------------+----------+-----------+------------------+  +----------+------------+----------+-----------+------------------+ LEFT TCD  Left VM (cm)Depth (cm)Pulsatility     Comment       +----------+------------+----------+-----------+------------------+ MCA                                        unable to insonate +----------+------------+----------+-----------+------------------+ ACA                                        unable to insonate +----------+------------+----------+-----------+------------------+ Term ICA                                   unable to insonate +----------+------------+----------+-----------+------------------+ PCA                                        unable to insonate +----------+------------+----------+-----------+------------------+ Opthalmic                                  unable to insonate +----------+------------+----------+-----------+------------------+ ICA siphon                                 unable to insonate +----------+------------+----------+-----------+------------------+ Vertebral                          -49.4                      +----------+------------+----------+-----------+------------------+  +------------+-----+------------------+             VM cm     Comment       +------------+-----+------------------+ Prox Basilar     unable to insonate +------------+-----+------------------+ Dist Basilar     unable to insonate +------------+-----+------------------+ Summary:  Poor windows throughout greatly limit exam. antegrade flow in right opthalmic and bilateral vertebral arteries noted. *See table(s) above for TCD measurements and observations.  Diagnosing physician: Delia Heady MD Electronically signed by Delia Heady MD on 09/06/2022 at 8:13:43 AM.    Final  DG CHEST PORT 1 VIEW  Result Date: 09/06/2022 CLINICAL DATA:  Ventilator  dependence. EXAM: PORTABLE CHEST 1 VIEW COMPARISON:  09/05/2022 FINDINGS: 0544 hours. Endotracheal tube tip is 5.8 cm above the base of the carina. The NG tube passes into the stomach although the distal tip position is not included on the film. Low lung volumes with asymmetric elevation right hemidiaphragm. There is pulmonary vascular congestion without overt pulmonary edema. Left base collapse/consolidation is similar to prior with interval decrease in right base atelectasis. Small left pleural effusion suspected. Telemetry leads overlie the chest. IMPRESSION: 1. Interval decrease in right base atelectasis. Otherwise no substantial change in cardiopulmonary exam. 2. Endotracheal tube tip is 5.8 cm above the base of the carina. Electronically Signed   By: Kennith Center M.D.   On: 09/06/2022 08:03   DG Chest Port 1 View  Result Date: 09/05/2022 CLINICAL DATA:  Respiratory failure. EXAM: PORTABLE CHEST 1 VIEW COMPARISON:  09/04/2022 FINDINGS: 0528 hours. Low volume film. The cardio pericardial silhouette is enlarged. Endotracheal tube tip is approximately 5.0 cm above the base of the carina. The NG tube passes into the stomach although the distal tip position is not included on the film. There is pulmonary vascular congestion with probable interstitial edema. Left greater than right basilar atelectasis again noted with probable small left effusion. Telemetry leads overlie the chest. IMPRESSION: 1. Low volume film with pulmonary vascular congestion and probable interstitial edema. 2. Left greater than right basilar atelectasis with probable small left effusion. Electronically Signed   By: Kennith Center M.D.   On: 09/05/2022 06:26    Labs: BMET Recent Labs  Lab 09/02/22 1058 09/02/22 1134 09/02/22 1808 09/03/22 0020 09/03/22 0626 09/03/22 1545 09/03/22 1551 09/04/22 0150 09/04/22 0413 09/04/22 1629 09/04/22 2037 09/05/22 0411 09/05/22 0501 09/05/22 1117 09/05/22 2013 09/06/22 0002  09/06/22 0420 09/06/22 0509  NA 139   < >  --    < > 139 138   < > 139   < > 141   < > 144 141 142 141 142 141 143  K 5.0   < >  --    < > 5.4* 4.8   < > 5.1   < > 4.9   < > 3.6 4.3 3.5 3.8 3.4* 3.6 3.8  CL 105  --   --   --  105 105  --  103  --  104  --   --  103  --   --   --   --  99  CO2 25  --   --   --  23 23  --  21*  --  23  --   --  24  --   --   --   --  28  GLUCOSE 134*  --   --   --  150* 148*  --  154*  --  146*  --   --  166*  --   --   --   --  188*  BUN 26*  --   --   --  52* 59*  --  70*  --  81*  --   --  90*  --   --   --   --  104*  CREATININE 1.49*  --   --   --  3.58* 4.46*  --  5.73*  --  5.61*  --   --  5.23*  --   --   --   --  4.66*  CALCIUM 8.1*  --   --   --  8.6* 7.7*  --  8.4*  --  8.3*  --   --  8.2*  --   --   --   --  8.2*  PHOS 4.5  --  5.9*  --  7.4*  --   --  10.1*  --   --   --   --  10.8*  --   --   --   --  7.5*   < > = values in this interval not displayed.   CBC Recent Labs  Lab 28-Sep-2022 0900 2022/09/28 0908 09/03/22 0626 09/03/22 1551 09/04/22 0150 09/04/22 0413 09/05/22 0501 09/05/22 1117 09/05/22 2013 09/06/22 0002 09/06/22 0420 09/06/22 0851  WBC 70.5*   < > 75.3*  --  59.0*  --  57.5*  --   --   --   --  39.5*  NEUTROABS 9.0*  --   --   --   --   --   --   --   --   --   --  PENDING  HGB 15.5   < > 13.9   < > 12.9*   < > 13.2   < > 12.2* 10.9* 11.9* 11.4*  HCT 51.2   < > 45.1   < > 43.4   < > 42.3   < > 36.0* 32.0* 35.0* 34.8*  MCV 97.0   < > 97.2  --  100.9*  --  98.6  --   --   --   --  94.8  PLT 212   < > 185  --  160  --  163  --   --   --   --  143*   < > = values in this interval not displayed.    Medications:     Chlorhexidine Gluconate Cloth  6 each Topical Daily   feeding supplement (PROSource TF20)  60 mL Per Tube BID   heparin injection (subcutaneous)  5,000 Units Subcutaneous Q8H   hydrALAZINE  25 mg Per Tube Q8H   insulin aspart  0-15 Units Subcutaneous Q4H   insulin aspart  4 Units Subcutaneous QID   metoprolol  tartrate  50 mg Per Tube BID   niMODipine  60 mg Oral Q4H   Or   niMODipine  60 mg Per Tube Q4H   mouth rinse  15 mL Mouth Rinse Q2H   pantoprazole  40 mg Per Tube Daily    Zetta Bills, MD 09/06/2022, 10:11 AM

## 2022-09-07 ENCOUNTER — Other Ambulatory Visit (HOSPITAL_COMMUNITY): Payer: Self-pay

## 2022-09-07 LAB — MAGNESIUM: Magnesium: 1.9 mg/dL (ref 1.7–2.4)

## 2022-09-07 LAB — POCT I-STAT 7, (LYTES, BLD GAS, ICA,H+H)
Acid-Base Excess: 5 mmol/L — ABNORMAL HIGH (ref 0.0–2.0)
Acid-Base Excess: 6 mmol/L — ABNORMAL HIGH (ref 0.0–2.0)
Acid-Base Excess: 5 mmol/L — ABNORMAL HIGH (ref 0.0–2.0)
Bicarbonate: 32.7 mmol/L — ABNORMAL HIGH (ref 20.0–28.0)
Bicarbonate: 32.9 mmol/L — ABNORMAL HIGH (ref 20.0–28.0)
Bicarbonate: 31.7 mmol/L — ABNORMAL HIGH (ref 20.0–28.0)
Calcium, Ion: 1.13 mmol/L — ABNORMAL LOW (ref 1.15–1.40)
Calcium, Ion: 1.15 mmol/L (ref 1.15–1.40)
Calcium, Ion: 1.15 mmol/L (ref 1.15–1.40)
HCT: 36 % — ABNORMAL LOW (ref 39.0–52.0)
HCT: 34 % — ABNORMAL LOW (ref 39.0–52.0)
HCT: 34 % — ABNORMAL LOW (ref 39.0–52.0)
Hemoglobin: 12.2 g/dL — ABNORMAL LOW (ref 13.0–17.0)
Hemoglobin: 11.6 g/dL — ABNORMAL LOW (ref 13.0–17.0)
Hemoglobin: 11.6 g/dL — ABNORMAL LOW (ref 13.0–17.0)
O2 Saturation: 98 %
O2 Saturation: 99 %
O2 Saturation: 97 %
Patient temperature: 37.6
Patient temperature: 37.9
Patient temperature: 37.8
Potassium: 3.7 mmol/L (ref 3.5–5.1)
Potassium: 4.3 mmol/L (ref 3.5–5.1)
Potassium: 4.3 mmol/L (ref 3.5–5.1)
Sodium: 145 mmol/L (ref 135–145)
Sodium: 146 mmol/L — ABNORMAL HIGH (ref 135–145)
Sodium: 146 mmol/L — ABNORMAL HIGH (ref 135–145)
TCO2: 35 mmol/L — ABNORMAL HIGH (ref 22–32)
TCO2: 35 mmol/L — ABNORMAL HIGH (ref 22–32)
TCO2: 33 mmol/L — ABNORMAL HIGH (ref 22–32)
pCO2 arterial: 63.4 mmHg — ABNORMAL HIGH (ref 32–48)
pCO2 arterial: 61.9 mmHg — ABNORMAL HIGH (ref 32–48)
pCO2 arterial: 55.7 mmHg — ABNORMAL HIGH (ref 32–48)
pH, Arterial: 7.323 — ABNORMAL LOW (ref 7.35–7.45)
pH, Arterial: 7.337 — ABNORMAL LOW (ref 7.35–7.45)
pH, Arterial: 7.367 (ref 7.35–7.45)
pO2, Arterial: 127 mmHg — ABNORMAL HIGH (ref 83–108)
pO2, Arterial: 139 mmHg — ABNORMAL HIGH (ref 83–108)
pO2, Arterial: 104 mmHg (ref 83–108)

## 2022-09-07 LAB — GLUCOSE, CAPILLARY
Glucose-Capillary: 142 mg/dL — ABNORMAL HIGH (ref 70–99)
Glucose-Capillary: 151 mg/dL — ABNORMAL HIGH (ref 70–99)
Glucose-Capillary: 152 mg/dL — ABNORMAL HIGH (ref 70–99)
Glucose-Capillary: 156 mg/dL — ABNORMAL HIGH (ref 70–99)
Glucose-Capillary: 166 mg/dL — ABNORMAL HIGH (ref 70–99)
Glucose-Capillary: 207 mg/dL — ABNORMAL HIGH (ref 70–99)
Glucose-Capillary: 209 mg/dL — ABNORMAL HIGH (ref 70–99)
Glucose-Capillary: 214 mg/dL — ABNORMAL HIGH (ref 70–99)

## 2022-09-07 LAB — BASIC METABOLIC PANEL
Anion gap: 9 (ref 5–15)
BUN: 116 mg/dL — ABNORMAL HIGH (ref 8–23)
CO2: 30 mmol/L (ref 22–32)
Calcium: 8.5 mg/dL — ABNORMAL LOW (ref 8.9–10.3)
Chloride: 108 mmol/L (ref 98–111)
Creatinine, Ser: 3.1 mg/dL — ABNORMAL HIGH (ref 0.61–1.24)
GFR, Estimated: 22 mL/min — ABNORMAL LOW (ref >60)
Glucose, Bld: 177 mg/dL — ABNORMAL HIGH (ref 70–99)
Potassium: 4.4 mmol/L (ref 3.5–5.1)
Sodium: 147 mmol/L — ABNORMAL HIGH (ref 135–145)

## 2022-09-07 LAB — CBC WITH DIFFERENTIAL/PLATELET
Abs Immature Granulocytes: 0.17 10*3/uL — ABNORMAL HIGH (ref 0.00–0.07)
Basophils Absolute: 0.1 10*3/uL (ref 0.0–0.1)
Basophils Relative: 0 %
Eosinophils Absolute: 0.5 10*3/uL (ref 0.0–0.5)
Eosinophils Relative: 1 %
HCT: 35.3 % — ABNORMAL LOW (ref 39.0–52.0)
Hemoglobin: 11.4 g/dL — ABNORMAL LOW (ref 13.0–17.0)
Immature Granulocytes: 0 %
Lymphocytes Relative: 83 %
Lymphs Abs: 36.4 10*3/uL — ABNORMAL HIGH (ref 0.7–4.0)
MCH: 30.8 pg (ref 26.0–34.0)
MCHC: 32.3 g/dL (ref 30.0–36.0)
MCV: 95.4 fL (ref 80.0–100.0)
Monocytes Absolute: 0.8 10*3/uL (ref 0.1–1.0)
Monocytes Relative: 2 %
Neutro Abs: 6.4 10*3/uL (ref 1.7–7.7)
Neutrophils Relative %: 14 %
Platelets: 134 10*3/uL — ABNORMAL LOW (ref 150–400)
RBC: 3.7 MIL/uL — ABNORMAL LOW (ref 4.22–5.81)
RDW: 14.2 % (ref 11.5–15.5)
WBC: 44.3 10*3/uL — ABNORMAL HIGH (ref 4.0–10.5)
nRBC: 0 % (ref 0.0–0.2)

## 2022-09-07 LAB — RENAL FUNCTION PANEL
Albumin: 1.6 g/dL — ABNORMAL LOW (ref 3.5–5.0)
Anion gap: 12 (ref 5–15)
BUN: 90 mg/dL — ABNORMAL HIGH (ref 8–23)
CO2: 21 mmol/L — ABNORMAL LOW (ref 22–32)
Calcium: 5.9 mg/dL — CL (ref 8.9–10.3)
Chloride: 114 mmol/L — ABNORMAL HIGH (ref 98–111)
Creatinine, Ser: 2.54 mg/dL — ABNORMAL HIGH (ref 0.61–1.24)
GFR, Estimated: 28 mL/min — ABNORMAL LOW (ref >60)
Glucose, Bld: 152 mg/dL — ABNORMAL HIGH (ref 70–99)
Phosphorus: 3 mg/dL (ref 2.5–4.6)
Potassium: 2.4 mmol/L — CL (ref 3.5–5.1)
Sodium: 147 mmol/L — ABNORMAL HIGH (ref 135–145)

## 2022-09-07 LAB — TRIGLYCERIDES: Triglycerides: 111 mg/dL (ref <150)

## 2022-09-07 MED ORDER — CALCIUM GLUCONATE-NACL 1-0.675 GM/50ML-% IV SOLN
1.0000 g | Freq: Once | INTRAVENOUS | Status: AC
  Administered 2022-09-07: 1000 mg via INTRAVENOUS
  Filled 2022-09-07: qty 50

## 2022-09-07 MED ORDER — METHYLPREDNISOLONE SODIUM SUCC 40 MG IJ SOLR
40.0000 mg | INTRAMUSCULAR | Status: DC
  Administered 2022-09-07 – 2022-09-08 (×2): 40 mg via INTRAVENOUS
  Filled 2022-09-07 (×2): qty 1

## 2022-09-07 MED ORDER — MAGNESIUM SULFATE 2 GM/50ML IV SOLN
2.0000 g | Freq: Once | INTRAVENOUS | Status: AC
  Administered 2022-09-07: 2 g via INTRAVENOUS
  Filled 2022-09-07: qty 50

## 2022-09-07 MED ORDER — CEFAZOLIN SODIUM-DEXTROSE 2-4 GM/100ML-% IV SOLN
2.0000 g | Freq: Three times a day (TID) | INTRAVENOUS | Status: AC
  Administered 2022-09-07 – 2022-09-08 (×5): 2 g via INTRAVENOUS
  Filled 2022-09-07 (×5): qty 100

## 2022-09-07 MED ORDER — ALBUTEROL SULFATE (2.5 MG/3ML) 0.083% IN NEBU
2.5000 mg | INHALATION_SOLUTION | RESPIRATORY_TRACT | Status: DC | PRN
  Administered 2022-09-09: 2.5 mg via RESPIRATORY_TRACT
  Filled 2022-09-07 (×2): qty 3

## 2022-09-07 MED ORDER — POTASSIUM CHLORIDE 10 MEQ/100ML IV SOLN
10.0000 meq | INTRAVENOUS | Status: AC
  Administered 2022-09-07 (×6): 10 meq via INTRAVENOUS
  Filled 2022-09-07: qty 100

## 2022-09-07 MED ORDER — AMLODIPINE BESYLATE 10 MG PO TABS
10.0000 mg | ORAL_TABLET | Freq: Every day | ORAL | Status: DC
  Administered 2022-09-07: 10 mg
  Filled 2022-09-07: qty 1

## 2022-09-07 MED ORDER — FUROSEMIDE 10 MG/ML IJ SOLN
100.0000 mg | Freq: Three times a day (TID) | INTRAVENOUS | Status: AC
  Administered 2022-09-07 (×3): 100 mg via INTRAVENOUS
  Filled 2022-09-07 (×3): qty 10

## 2022-09-07 MED ORDER — HYDRALAZINE HCL 50 MG PO TABS
50.0000 mg | ORAL_TABLET | Freq: Three times a day (TID) | ORAL | Status: DC
  Administered 2022-09-07 – 2022-09-09 (×6): 50 mg
  Filled 2022-09-07 (×6): qty 1

## 2022-09-07 MED ORDER — IPRATROPIUM-ALBUTEROL 0.5-2.5 (3) MG/3ML IN SOLN
3.0000 mL | RESPIRATORY_TRACT | Status: DC
  Administered 2022-09-07 – 2022-09-12 (×33): 3 mL via RESPIRATORY_TRACT
  Filled 2022-09-07 (×33): qty 3

## 2022-09-07 MED ORDER — CLONIDINE HCL 0.1 MG PO TABS
0.1000 mg | ORAL_TABLET | Freq: Two times a day (BID) | ORAL | Status: DC
  Administered 2022-09-07 – 2022-09-09 (×6): 0.1 mg
  Filled 2022-09-07 (×7): qty 1

## 2022-09-07 MED ORDER — CHLORHEXIDINE GLUCONATE CLOTH 2 % EX PADS
6.0000 | MEDICATED_PAD | Freq: Every day | CUTANEOUS | Status: DC
  Administered 2022-09-08 – 2022-09-17 (×11): 6 via TOPICAL

## 2022-09-07 MED ORDER — POTASSIUM CHLORIDE 20 MEQ PO PACK
60.0000 meq | PACK | Freq: Once | ORAL | Status: AC
  Administered 2022-09-07: 60 meq
  Filled 2022-09-07: qty 3

## 2022-09-07 NOTE — Progress Notes (Signed)
Lab called the unit, but no information left to be given to this RN. This RN called lab directly around 0600 and the rep at that time stated that she did not understand why I was being called. Lab called back again w/o leaving any information to be given to this RN, b ut wished to speak w/ this RN. Lab called back at 0730 spoke w/ Maudie Mercury. Kim spoke of hematology possibly being the lab calling. Maudie Mercury stated also that the phone in the hematology area has a malfunction, which may explain why the phone would be disconnected before I could answer/transferred to. The hematology will be calling back to speak w/ the day RN Marlowe Kays.

## 2022-09-07 NOTE — Progress Notes (Signed)
Patient ID: Dustin Jones, male   DOB: 1961-12-03, 61 y.o.   MRN: [id number] [facility] Progress Note   Assessment/ Plan:   1. Acute kidney Injury: Suspected ATN with intercurrent injury from contrast exposure.  He had good response to diuretics overnight with 4.1 L urine output and improvement of renal function seen on labs this morning.  He does not have any acute electrolyte abnormality to prompt intervention and has hypokalemia reflective of diuresis/kaliuresis that is being corrected by CCM.  Nephrology service will sign off at this time and remain available for questions or concerns.  2.  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 3.  Ventilator dependent respiratory failure: With ARDS and suspected aspiration pneumonia.  On management with Unasyn. 4.  Severe leukocytosis/lymphocytosis: Suspected underlying hematological malignancy with left shift in the setting of aspiration pneumonia. 5.  Hypokalemia: Secondary to diuretic induced losses, replacement per CCM protocol  Subjective:   Without acute events overnight, robust urine output.   Objective:   BP (!) 141/68   Pulse 86   Temp 100 F (37.8 C)   Resp (!) 22   Ht 5\' 7"  (1.702 m)   Wt 132.6 kg   SpO2 96%   BMI 45.79 kg/m   Intake/Output Summary (Last 24 hours) at 09/07/2022 1017 Last data filed at 09/07/2022 7902 Gross per 24 hour  Intake 1853.97 ml  Output 4100 ml  Net -2246.03 ml   Weight change: 0 kg  Physical Exam: Gen: Intubated, unresponsive CVS: Pulse regular rhythm, normal rate, S1 and S2 normal Resp: Anteriorly coarse breath sounds bilaterally without distinct rales or rhonchi, on ventilator Abd: Firm, moderately generalized protuberance, tympanitic on exam.  Bowel sounds normal Ext: Trace-1+ lower extremity edema with 1-2+ upper extremity edema  Imaging: VAS [place] TRANSCRANIAL  DOPPLER  Result Date: 09/06/2022  Transcranial Doppler Patient Name:  Dustin Jones  Date of Exam:   09/05/2022 Medical Rec #: [id number]        Accession #:    [id number] Date of Birth: [birth year]        Patient Gender: M Patient Age:   61 years Exam Location:  [facility] Procedure:      VAS [place] TRANSCRANIAL DOPPLER Referring Phys: PRAMOD SETHI --------------------------------------------------------------------------------  Indications: ICH. Limitations: Ventilator, poor windows Limitations for diagnostic windows: Unable to insonate right transtemporal window. Unable to insonate left transtemporal window. Comparison Study: No prior study Performing Technologist: Sharion Dove RVS  Examination Guidelines: A complete evaluation includes B-mode imaging, spectral Doppler, color Doppler, and power Doppler as needed of all accessible portions of each vessel. Bilateral testing is considered an integral part of a complete examination. Limited examinations for reoccurring indications may be performed as noted.  +----------+-------------+----------+-----------+------------------+ RIGHT TCD Right VM (cm)Depth (cm)Pulsatility     Comment       +----------+-------------+----------+-----------+------------------+ MCA                                         unable to insonate +----------+-------------+----------+-----------+------------------+ ACA                                         unable to insonate +----------+-------------+----------+-----------+------------------+ Term ICA  unable to insonate +----------+-------------+----------+-----------+------------------+ PCA                                         unable to insonate +----------+-------------+----------+-----------+------------------+ Opthalmic     69.20                 2.08                       +----------+-------------+----------+-----------+------------------+ ICA siphon                                   unable to insonate +----------+-------------+----------+-----------+------------------+ Vertebral    -56.30                                            +----------+-------------+----------+-----------+------------------+  +----------+------------+----------+-----------+------------------+ LEFT TCD  Left VM (cm)Depth (cm)Pulsatility     Comment       +----------+------------+----------+-----------+------------------+ MCA                                        unable to insonate +----------+------------+----------+-----------+------------------+ ACA                                        unable to insonate +----------+------------+----------+-----------+------------------+ Term ICA                                   unable to insonate +----------+------------+----------+-----------+------------------+ PCA                                        unable to insonate +----------+------------+----------+-----------+------------------+ Opthalmic                                  unable to insonate +----------+------------+----------+-----------+------------------+ ICA siphon                                 unable to insonate +----------+------------+----------+-----------+------------------+ Vertebral                          -49.4                      +----------+------------+----------+-----------+------------------+  +------------+-----+------------------+             VM cm     Comment       +------------+-----+------------------+ Prox Basilar     unable to insonate +------------+-----+------------------+ Dist Basilar     unable to insonate +------------+-----+------------------+ Summary:  Poor windows throughout greatly limit exam. antegrade flow in right opthalmic and bilateral vertebral arteries noted. *See table(s) above for TCD measurements and observations.  Diagnosing physician: Antony Contras MD Electronically signed by Antony Contras MD on 09/06/2022 at 8:13:43 AM.    Final  DG CHEST PORT 1 VIEW  Result Date: 09/06/2022 CLINICAL DATA:  Ventilator dependence. EXAM: PORTABLE CHEST 1 VIEW COMPARISON:  09/05/2022 FINDINGS: 0544 hours. Endotracheal tube tip is 5.8 cm above the base of the carina. The NG tube passes into the stomach although the distal tip position is not included on the film. Low lung volumes with asymmetric elevation right hemidiaphragm. There is pulmonary vascular congestion without overt pulmonary edema. Left base collapse/consolidation is similar to prior with interval decrease in right base atelectasis. Small left pleural effusion suspected. Telemetry leads overlie the chest. IMPRESSION: 1. Interval decrease in right base atelectasis. Otherwise no substantial change in cardiopulmonary exam. 2. Endotracheal tube tip is 5.8 cm above the base of the carina. Electronically Signed   By: Misty Stanley M.D.   On: 09/06/2022 08:03    Labs: BMET Recent Labs  Lab 09/02/22 1058 09/02/22 1134 09/02/22 1808 09/03/22 0020 09/03/22 0626 09/03/22 1545 09/03/22 1551 09/04/22 0150 09/04/22 0413 09/04/22 1629 09/04/22 2037 09/05/22 0501 09/05/22 1117 09/05/22 2013 09/06/22 0002 09/06/22 0420 09/06/22 0509 09/06/22 1012 09/07/22 0538 09/07/22 0814  NA 139   < >  --    < > 139 138   < > 139   < > 141   < > 141   < > 141 142 141 143 142 147* 145  K 5.0   < >  --    < > 5.4* 4.8   < > 5.1   < > 4.9   < > 4.3   < > 3.8 3.4* 3.6 3.8 3.4* 2.4* 3.7  CL 105  --   --   --  105 105  --  103  --  104  --  103  --   --   --   --  99  --  114*  --   CO2 25  --   --   --  23 23  --  21*  --  23  --  24  --   --   --   --  28  --  21*  --   GLUCOSE 134*  --   --   --  150* 148*  --  154*  --  146*  --  166*  --   --   --   --  188*  --  152*  --   BUN 26*  --   --   --  52* 59*  --  70*  --  81*  --  90*  --   --   --   --  104*  --  90*  --   CREATININE 1.49*  --   --   --  3.58* 4.46*  --  5.73*  --  5.61*  --  5.23*   --   --   --   --  4.66*  --  2.54*  --   CALCIUM 8.1*  --   --   --  8.6* 7.7*  --  8.4*  --  8.3*  --  8.2*  --   --   --   --  8.2*  --  5.9*  --   PHOS 4.5  --  5.9*  --  7.4*  --   --  10.1*  --   --   --  10.8*  --   --   --   --  7.5*  --  3.0  --    < > =  values in this interval not displayed.   CBC Recent Labs  Lab 08/14/2022 0900 09/02/2022 0908 09/04/22 0150 09/04/22 0413 09/05/22 0501 09/05/22 1117 09/06/22 0851 09/06/22 1012 09/07/22 0538 09/07/22 0814  WBC 70.5*   < > 59.0*  --  57.5*  --  39.5*  --  44.3*  --   NEUTROABS 9.0*  --   --   --   --   --  7.1  --  6.4  --   HGB 15.5   < > 12.9*   < > 13.2   < > 11.4* 10.5* 11.4* 12.2*  HCT 51.2   < > 43.4   < > 42.3   < > 34.8* 31.0* 35.3* 36.0*  MCV 97.0   < > 100.9*  --  98.6  --  94.8  --  95.4  --   PLT 212   < > 160  --  163  --  143*  --  134*  --    < > = values in this interval not displayed.    Medications:     Chlorhexidine Gluconate Cloth  6 each Topical Daily   cloNIDine  0.1 mg Per Tube BID   feeding supplement (PROSource TF20)  60 mL Per Tube BID   heparin injection (subcutaneous)  5,000 Units Subcutaneous Q8H   hydrALAZINE  50 mg Per Tube Q8H   insulin aspart  0-15 Units Subcutaneous Q4H   insulin aspart  4 Units Subcutaneous QID   ipratropium-albuterol  3 mL Nebulization Q4H   metoprolol tartrate  50 mg Per Tube BID   niMODipine  60 mg Oral Q4H   Or   niMODipine  60 mg Per Tube Q4H   mouth rinse  15 mL Mouth Rinse Q2H   pantoprazole  40 mg Per Tube Daily   sodium bicarbonate  1,300 mg Per Tube TID    Elmarie Shiley, MD 09/07/2022, 10:17 AM

## 2022-09-07 NOTE — Plan of Care (Signed)
  Problem: Safety: Goal: Non-violent Restraint(s) Outcome: Progressing   Problem: Education: Goal: Knowledge of disease or condition will improve Outcome: Progressing Goal: Knowledge of secondary prevention will improve (SELECT ALL) Outcome: Progressing Goal: Knowledge of patient specific risk factors will improve (INDIVIDUALIZE FOR PATIENT) Outcome: Progressing Goal: Individualized Educational Video(s) Outcome: Progressing   Problem: Health Behavior/Discharge Planning: Goal: Ability to manage health-related needs will improve Outcome: Progressing

## 2022-09-07 NOTE — Progress Notes (Signed)
Date and time results received: 09/07/22 0737 (use smartphrase ".now" to insert current time)  Test: BMET Critical Value: Ca+ - 5.9, K+ - 2.4  Name of Provider Notified: Dr Glade Lloyd  Orders Received? Or Actions Taken?: Orders Received - See Orders for details

## 2022-09-07 NOTE — Progress Notes (Signed)
SLP Cancellation Note  Patient Details Name: Dustin Jones MRN: [id number] DOB: 1961-07-10   Cancelled treatment:       Reason Eval/Treat Not Completed: Patient not medically ready. Pt remains intubated.      Ellwood Dense, [place], [place] [facility] Office Number: [phone]  Acie Fredrickson 09/07/2022, 10:34 AM

## 2022-09-07 NOTE — Progress Notes (Addendum)
Pt noted w/ SBP >160 and w/ vent dyssynch. Pt w/o prn anti-hypertensive. Elink RN informed. D/t Clev still being on the pt's profile, she suggested to restart this med if increasing Versed does not work. No change s/p 1 mg Versed bolus and 100 mcg Fent bolus, then versed at max.

## 2022-09-07 NOTE — Plan of Care (Signed)
  Problem: Safety: Goal: Non-violent Restraint(s) Outcome: Progressing   Problem: Education: Goal: Knowledge of disease or condition will improve Outcome: Progressing Goal: Knowledge of secondary prevention will improve (SELECT ALL) Outcome: Progressing Goal: Knowledge of patient specific risk factors will improve (INDIVIDUALIZE FOR PATIENT) Outcome: Progressing Goal: Individualized Educational Video(s) Outcome: Progressing   Problem: Health Behavior/Discharge Planning: Goal: Ability to manage health-related needs will improve Outcome: Progressing   

## 2022-09-07 NOTE — Progress Notes (Signed)
Pharmacy Antibiotic Note  Dustin Jones is a 61 y.o. male admitted on 09/10/2022 and now found to have MSSA PNA.  Pharmacy has been consulted for cefazolin dosing.  Patient on day 6/7 of antibiotic therapy.   Dose change is warranted given patient's steadily improving renal function.SCr 2.54, CrCl 40 from SCr 4.66, CrCl 22 yesterday.  Plan: - Increase cefazolin to 2 g IV every 8 hours (stop date 10/6) - Continue to monitor renal function, culture results, LOT, and antibiotic de-escalation plans  Height: 5\' 7"  (170.2 cm) Weight: 132.6 kg (292 lb 5.3 oz) IBW/kg (Calculated) : 66.1  Temp (24hrs), Avg:99.7 F (37.6 C), Min:99.3 F (37.4 C), Max:100 F (37.8 C)  Recent Labs  Lab 09/03/22 0626 09/03/22 1545 09/04/22 0150 09/04/22 1629 09/05/22 0501 09/06/22 0509 09/06/22 0851 09/07/22 0538  WBC 75.3*  --  59.0*  --  57.5*  --  39.5* 44.3*  CREATININE 3.58*   < > 5.73* 5.61* 5.23* 4.66*  --  2.54*   < > = values in this interval not displayed.    Estimated Creatinine Clearance: 40 mL/min (A) (by C-G formula based on SCr of 2.54 mg/dL (H)).    Not on File  Antimicrobials this admission: Unasyn 9/29 >> 10/1 Cefazolin 10/1 >>  Dose adjustments this admission: Cefazolin 1 g q12h >>> cefazolin 2 g q8h  Microbiology results: 9/29 RCx >> MSSA  Thank you for allowing pharmacy to be a part of this patient's care.  Freddie Breech 09/07/2022 8:51 AM

## 2022-09-07 NOTE — Progress Notes (Addendum)
NAME:  Dustin Jones, MRN:  [id number], DOB:  1961/11/15, LOS: 6 ADMISSION DATE:  08/28/2022, CONSULTATION DATE:  08/29/2022 REFERRING MD:  Maryan Rued, MD, CHIEF COMPLAINT:  Intracranial hemorrhage   History of Present Illness:  Patient with a history of hypertension, DVT and chronic tobacco use originally presented to [facility] ED as a code stroke  He was last seen walking around at 3 am. Found down  at 8 am by bystanders who called 911. EMS reported the pt had L-sided deficits and AMS. Systolic BP reported to be in the 190's per EMS. CT imaging in ED revealed acute intraparenchymal hemorrhage with possible lobulated 16mm outpouching arising from the supraclinoid right ICA that is suspicious for ruptured aneurysm. Was admitted by Neurosurgery for close monitoring and further diagnostic testing. CCM was consulted for ventilator management.   Pertinent  Medical History  Hypertension History of DVT Hx Etoh use, not quantified HX of Tobacco use, > 25 pack years  Significant Hospital Events: Including procedures, antibiotic start and stop dates in addition to other pertinent events   9/28 admitted. Intracranial Hemorrhage/ R MCA ruptured aneurysm Non-con CT of head demonstrating large acute hemorrhage in the R lentiform nucleus with with intraventricular extension, midline shift, and slight rounding of the temporal horns, possibly representing early hydrocephalus. On CTA, pt found to have a R MCA ruptured aneurysm. Neurosurgery consulted and following with plans for possible arterial clipping this evening. CT of the face and cervical spine did not show any acute traumatic injuries, though BL cervical lymphadenopathy was noted. Echo with normal biventricular function without evidence of hemodynamically sig valvular heart disease.  9/28 complete echo with LVEF of 60-65%, no regional wall motion abnormalities but with grade 1 diastolic dysfunction.  Normal right ventricular systolic function.  No valvular  abnormalities. 9/29 MRI brain 1. Large acute intraparenchymal hemorrhage centered in the rightlentiform nucleus, with extension into the right-greater-than-left lateral ventricle, third ventricle, and fourth ventricle, overall similar to the prior CT. Mild mass effect and 5 mm of right-to-left midline shift. No hydrocephalus. 2. Small areas of acute infarct in the bilateral occipital lobes, bilateral frontal lobes, and right parietal lobe. Spiking fever. Has sig leukocytosis. Cultures sent. Unasyn started for aspiration  9/29 respiratory culture positive for MSSA 9/29 self extubated, required reintubation 10/2 Family identified   Interim History / Subjective:  Patient continues to put out adequate amount of urine with 3.65L over the past 24 hours at 1.1 cc/kg/hr status post  100 mg lasix TID regimen yesterday. Still ~6 L net fluid positive since admission with drips and tube feeds. Patient is tolerating tube feeds well, still having bowel movements.  Overnight, patient did well until he was turned this morning.  When turned, patient became significantly hypertensive (systolic > 99991111) and dyssynchronous with the vent.  Patient received 1 mg of Versed bolus with 100 mcg of fentanyl, and Cleviprex was restarted.  Patient's systolic blood pressure remained greater then 170, with a MAP > 90 and Cleviprex was up titrated to 8 Mg/hour to achieve goal systolic of < 0000000 mmhg.   Potassium, calcium and magnesium repleted.    Objective   Blood pressure (!) 156/75, pulse 79, temperature 99.5 F (37.5 C), resp. rate (!) 27, height 5\' 7"  (1.702 m), weight 132.6 kg, SpO2 96 %.    Vent Mode: PRVC FiO2 (%):  [50 %] 50 % Set Rate:  [25 bmp-30 bmp] 30 bmp Vt Set:  [550 mL] 550 mL PEEP:  [8 cmH20-10 cmH20] 8 cmH20  Plateau Pressure:  [22 cmH20-27 cmH20] 22 cmH20   Intake/Output Summary (Last 24 hours) at 09/07/2022 0722 Last data filed at 09/07/2022 0700 Gross per 24 hour  Intake 2019.37 ml  Output 3230 ml  Net  -1210.63 ml    Filed Weights   09/05/22 0453 09/06/22 0500 09/07/22 0344  Weight: 130.3 kg 132.6 kg 132.6 kg    Examination:  General: Diaphoretic, large body habitus, intubated, laying towards left side, unresponsive to pain/verbal stimuli HEENT:  ET tube in place, moist, somewhat enlarged tongue, no lesions, lateral left eye scleral extrusion still present.  Pulmonary: Mild wheezes and mild restricted air movement in bilateral apical lung fields, dyssynchronous with vent on lowered sedation, diminished breath sounds in lower lung fields bilaterally Cardiac: regular rate and rhythm, no obvious murmurs/rubs/gallops appreciated Abdomen: obese, slightly distended, mildly tympanic, paraumbilical hernia Extremities trace peripheral edema in upper and lower extremities Neuo: sedated, moves right side when turned per nursing, opposite eye blink when lifting other eyelid, bilaterally.  Resolved Hospital Problem list   Hypertensive emergency   Assessment & Plan:  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 c/b R>L IVH (no hydrocephalus)  Mild acute encephalopathy  Pupils at 41mm, delayed reflex, symmetric. Neuro exam still limited given elevated midazolam needs.  On transcranial VASC US Doppler ultrasound, poor windows throughout greatly limit exam. antegrade flow in right  opthalmic and bilateral vertebral arteries was noted. Per nursing patient only uses right side when agitated (noticed when turning and with oral hygiene care).  -Titrate down Cleviprex as tolerated while adding p.o. medications -Continue lopressor 50 mg BID per tube -Continue scheduled nimodipine 60 mg per tube, q 4hrs -Increase hydralazine to 50 mg q 8hrs per tube -Start clonidine 0.1 mg BID per day -Serial neurochecks -SBP goal  <160  - Continue with heparin for DVT prophylaxis - Goals of care conversation with family  Acute hypoxic respiratory failure w/ aspiration PNA/ ALI Self extubation 9/29,  required reintubation Respiratory acidosis Patient ABG of 7.3/63 point 4/127/30 2.7 after PEEP reduced from 10-8.  Vent settings changed  at bedside back to PEEP 10, RR 30 Tidal Volume 542ml for ARDS precautions.  On day 6/7 of antibiotics.  Respiratory status likely influenced by overloaded fluid status ( + ~6L), was net negative by 2 L yesterday.  Respiratory status also likely influenced by sedation level, patient requires a deeper level sedation to make sure that he is synchronous with vent. -Continue to diurese, and offload fluid. -Start DuoNeb every 4 hours/albuterol every 2 hours as needed  - Wean  FiO2 as able, maintain PEEP and ARDS precautions right now - Continue cefazolin 1gram q12hrs for a total of 7 day - Continue Midazolam for sedation, goal RASS of -2 - Continue PO NAHCO3 1300mg  TID,  - Decrease obligate input IV fluids as able  - Follow ABG status post vent changes  - Patient requires deeper sedation at this time given ventilator needs and dyssynchrony with vent when nonsedated.    Fever  severe leukocytosis Flow cytometry showed monoclonal B-cell population, consistent with chronic lymphocytic leukemia.  CD5, CD19, CD20, CD200, and kappa cells identified.  Patient is currently on day 6/7 of antibiotics, unasyn transitioned to cefazolin given MSSA found on tracheal aspirate. Dosing and regimen per pharmacy.  WBCs at 44.3 from 39.5.  Platelets at 134 from 143.  Hemoglobin stable at 11.4. -Heme/onc consulted, appreciate ongoing recs -Continue cefazolin 1 g every 12 hours for a total of 7 days -Trend CBC  AKI Continues to improve.  Creatinine at 2.54 up from 4.66, BUN at 90 from 104 and responded well to 100 mg 3 times daily of Lasix yesterday, UOP at 3.65 L, and 1.1 cc/kg/hr for the past 24 hours, net negative of 2 L over the past 24 hours. Sodium at 147 from 141, Potassium at 2.4 from 3.7, magnesium at 1.9, corrected calcium at 7.8, was low given hypoalbuminemia. Likely ATN 2/2  to contrast + VDRF.  Electrolyte abnormalities likely present secondary to intense diuretic regimen. - Recheck BMP at 1500 status post electrolyte repletion - Nephro was previously following and they have signed off for now given significantly improved UOP and improving creatinine -Continue PO NAHCO3 1300mg  TID -Continue Lasix 100 mg TID, daily volume goal of being net negative  - Daily renal panel - Replete electrolytes PRN - Strict I's and O's - Avoid nephrotoxins - Decrease obligate input IV fluids as able, in order to decrease fluid status  Hyperglycemia  -CBG, with goal of 140-180 -Sliding scale insulin  Bowel motility - Bowel regimen PRN given loose stools - Tube feeds at 69ml/hr, continue  Best Practice (right click and "Reselect all SmartList Selections" daily)   Diet/type: tubefeeds DVT prophylaxis: systemic heparin GI prophylaxis: PPI Lines: Arterial Line and yes and it is still needed Foley:  Yes, and it is still needed Code Status:  full code  Damaris Schooner MS4, [facility]

## 2022-09-08 ENCOUNTER — Inpatient Hospital Stay (HOSPITAL_COMMUNITY): Payer: Medicaid Other

## 2022-09-08 ENCOUNTER — Encounter (HOSPITAL_COMMUNITY): Payer: Self-pay | Admitting: Radiology

## 2022-09-08 LAB — CBC WITH DIFFERENTIAL/PLATELET
Abs Immature Granulocytes: 0 10*3/uL (ref 0.00–0.07)
Basophils Absolute: 0 10*3/uL (ref 0.0–0.1)
Basophils Relative: 0 %
Eosinophils Absolute: 0 10*3/uL (ref 0.0–0.5)
Eosinophils Relative: 0 %
HCT: 37.3 % — ABNORMAL LOW (ref 39.0–52.0)
Hemoglobin: 11.8 g/dL — ABNORMAL LOW (ref 13.0–17.0)
Lymphocytes Relative: 86 %
Lymphs Abs: 39.3 10*3/uL — ABNORMAL HIGH (ref 0.7–4.0)
MCH: 30.3 pg (ref 26.0–34.0)
MCHC: 31.6 g/dL (ref 30.0–36.0)
MCV: 95.6 fL (ref 80.0–100.0)
Monocytes Absolute: 0.9 10*3/uL (ref 0.1–1.0)
Monocytes Relative: 2 %
Neutro Abs: 5.5 10*3/uL (ref 1.7–7.7)
Neutrophils Relative %: 12 %
Platelets: 161 10*3/uL (ref 150–400)
RBC: 3.9 MIL/uL — ABNORMAL LOW (ref 4.22–5.81)
RDW: 14.1 % (ref 11.5–15.5)
WBC: 45.7 10*3/uL — ABNORMAL HIGH (ref 4.0–10.5)
nRBC: 0 % (ref 0.0–0.2)
nRBC: 0 /100 WBC

## 2022-09-08 LAB — POCT I-STAT 7, (LYTES, BLD GAS, ICA,H+H)
Acid-Base Excess: 5 mmol/L — ABNORMAL HIGH (ref 0.0–2.0)
Bicarbonate: 29.8 mmol/L — ABNORMAL HIGH (ref 20.0–28.0)
Calcium, Ion: 1.12 mmol/L — ABNORMAL LOW (ref 1.15–1.40)
HCT: 32 % — ABNORMAL LOW (ref 39.0–52.0)
Hemoglobin: 10.9 g/dL — ABNORMAL LOW (ref 13.0–17.0)
O2 Saturation: 93 %
Patient temperature: 37.3
Potassium: 4 mmol/L (ref 3.5–5.1)
Sodium: 148 mmol/L — ABNORMAL HIGH (ref 135–145)
TCO2: 31 mmol/L (ref 22–32)
pCO2 arterial: 43.4 mmHg (ref 32–48)
pH, Arterial: 7.446 (ref 7.35–7.45)
pO2, Arterial: 64 mmHg — ABNORMAL LOW (ref 83–108)

## 2022-09-08 LAB — RENAL FUNCTION PANEL
Albumin: 2.5 g/dL — ABNORMAL LOW (ref 3.5–5.0)
Anion gap: 12 (ref 5–15)
BUN: 119 mg/dL — ABNORMAL HIGH (ref 8–23)
CO2: 30 mmol/L (ref 22–32)
Calcium: 8.8 mg/dL — ABNORMAL LOW (ref 8.9–10.3)
Chloride: 105 mmol/L (ref 98–111)
Creatinine, Ser: 2.59 mg/dL — ABNORMAL HIGH (ref 0.61–1.24)
GFR, Estimated: 27 mL/min — ABNORMAL LOW (ref >60)
Glucose, Bld: 219 mg/dL — ABNORMAL HIGH (ref 70–99)
Phosphorus: 3.6 mg/dL (ref 2.5–4.6)
Potassium: 4.5 mmol/L (ref 3.5–5.1)
Sodium: 147 mmol/L — ABNORMAL HIGH (ref 135–145)

## 2022-09-08 LAB — TRIGLYCERIDES: Triglycerides: 172 mg/dL — ABNORMAL HIGH (ref <150)

## 2022-09-08 LAB — GLUCOSE, CAPILLARY
Glucose-Capillary: 181 mg/dL — ABNORMAL HIGH (ref 70–99)
Glucose-Capillary: 204 mg/dL — ABNORMAL HIGH (ref 70–99)
Glucose-Capillary: 219 mg/dL — ABNORMAL HIGH (ref 70–99)
Glucose-Capillary: 220 mg/dL — ABNORMAL HIGH (ref 70–99)
Glucose-Capillary: 227 mg/dL — ABNORMAL HIGH (ref 70–99)
Glucose-Capillary: 287 mg/dL — ABNORMAL HIGH (ref 70–99)

## 2022-09-08 LAB — MAGNESIUM: Magnesium: 2.8 mg/dL — ABNORMAL HIGH (ref 1.7–2.4)

## 2022-09-08 MED ORDER — INSULIN GLARGINE-YFGN 100 UNIT/ML ~~LOC~~ SOLN
10.0000 [IU] | Freq: Every day | SUBCUTANEOUS | Status: DC
  Administered 2022-09-08 – 2022-09-09 (×2): 10 [IU] via SUBCUTANEOUS
  Filled 2022-09-08 (×3): qty 0.1

## 2022-09-08 MED ORDER — INSULIN ASPART 100 UNIT/ML IV SOLN: 4.0000 [IU] | INTRAVENOUS | Status: DC

## 2022-09-08 MED ORDER — FUROSEMIDE 10 MG/ML IJ SOLN
80.0000 mg | Freq: Two times a day (BID) | INTRAMUSCULAR | Status: DC
  Administered 2022-09-08 – 2022-09-10 (×6): 80 mg via INTRAVENOUS
  Filled 2022-09-08 (×6): qty 8

## 2022-09-08 MED ORDER — BANATROL TF EN LIQD
60.0000 mL | Freq: Two times a day (BID) | ENTERAL | Status: DC
  Administered 2022-09-08 – 2022-09-12 (×10): 60 mL
  Filled 2022-09-08 (×9): qty 60

## 2022-09-08 MED ORDER — INSULIN ASPART 100 UNIT/ML IV SOLN
4.0000 [IU] | INTRAVENOUS | Status: DC
  Administered 2022-09-08 – 2022-09-11 (×17): 4 [IU] via SUBCUTANEOUS

## 2022-09-08 NOTE — Progress Notes (Signed)
Pt w/ temp 100.36F, prn Tylenol admin and ice packs applied. Pt's temp at 100.69F at this time. Dr. Oletta Darter informed via Warren Lacy RN.

## 2022-09-08 NOTE — Progress Notes (Signed)
Following 100 mcg Fent and 4 mg Versed bolus for pt's hygiene care. Pt noted to have O2 sat at 88% briefly at 0100 then at 90-91%. RRT informed and lavage given to pt. Pt at 92% at 0539. No other concern, current POC adequate for care. Informed Dr. Matthias Hughs via the [facility] RN. Will conti to monitor pt.

## 2022-09-08 NOTE — Progress Notes (Signed)
  Transition of Care [facility]) Screening Note   Patient Details  Name: Dustin Jones Date of Birth: 08/05/1961   Transition of [facility]) CM/SW Contact:    Benard Halsted, LCSW Phone Number: 09/08/2022, 5:09 PM    Transition of Care Department [facility]) has reviewed patient. We will continue to monitor patient advancement through interdisciplinary progression rounds. If new patient transition needs arise, please place a TOC consult.

## 2022-09-08 NOTE — Progress Notes (Signed)
RT transported to CT and back to 4N 27 on full vent support. No complications noted.

## 2022-09-08 NOTE — Progress Notes (Signed)
[place] Progress Note Patient Name: Dustin Jones DOB: 06/25/1961 MRN: [id number]   Date of Service  09/08/2022  HPI/Events of Note  Fever to 100.9 F - Already given Tylenol and ice packs. Patient felt to have aspiration on admission and was on Ancef for MSSA in sputum which was D/Ced today. 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   eICU Interventions  Plan: Tracheal aspirate for gram stain and culture.  Blood cultures X 2. Defer Antibiotic Rx to PCCM rounding team pending culture results.      Intervention Category Major Interventions: Infection - evaluation and management  Lilja Soland [place] 09/08/2022, 10:51 PM

## 2022-09-08 NOTE — Progress Notes (Signed)
NAME:  Dustin Jones, MRN:  [id number], DOB:  [birth year], LOS: 7 ADMISSION DATE:  28-Sep-2022, CONSULTATION DATE:  2022-09-28 REFERRING MD:  Anitra Lauth, MD, CHIEF COMPLAINT:  Intracranial hemorrhage   History of Present Illness:  Patient with a history of hypertension, DVT and chronic tobacco use originally presented to [facility] ED as a code stroke  He was last seen walking around at 3 am. Found down  at 8 am by bystanders who called 911. EMS reported the pt had L-sided deficits and AMS. Systolic BP reported to be in the 190's per EMS. CT imaging in ED revealed acute intraparenchymal hemorrhage with possible lobulated 84mm outpouching arising from the supraclinoid right ICA that is suspicious for ruptured aneurysm. Was admitted by Neurosurgery for close monitoring and further diagnostic testing. CCM was consulted for ventilator management.   Pertinent  Medical History  Hypertension History of DVT Hx Etoh use, not quantified HX of Tobacco use, > 25 pack years  Significant Hospital Events: Including procedures, antibiotic start and stop dates in addition to other pertinent events   9/28 admitted. Intracranial Hemorrhage/ R MCA ruptured aneurysm Non-con CT of head demonstrating large acute hemorrhage in the R lentiform nucleus with with intraventricular extension, midline shift, and slight rounding of the temporal horns, possibly representing early hydrocephalus. On CTA, pt found to have a R MCA ruptured aneurysm. Neurosurgery consulted and following with plans for possible arterial clipping this evening. CT of the face and cervical spine did not show any acute traumatic injuries, though BL cervical lymphadenopathy was noted. Echo with normal biventricular function without evidence of hemodynamically sig valvular heart disease.  9/28 complete echo with LVEF of 60-65%, no regional wall motion abnormalities but with grade 1 diastolic dysfunction.  Normal right ventricular systolic function.  No valvular  abnormalities. 9/29 MRI brain 1. Large acute intraparenchymal hemorrhage centered in the rightlentiform nucleus, with extension into the right-greater-than-left lateral ventricle, third ventricle, and fourth ventricle, overall similar to the prior CT. Mild mass effect and 5 mm of right-to-left midline shift. No hydrocephalus. 2. Small areas of acute infarct in the bilateral occipital lobes, bilateral frontal lobes, and right parietal lobe. Spiking fever. Has sig leukocytosis. Cultures sent. Unasyn started for aspiration  9/29 respiratory culture positive for MSSA 9/29 self extubated, required reintubation 10/2 Family identified   Interim History / Subjective:  Patient continues to put out adequate amount of urine with 5.35L over the past 24 hours at 1.8 cc/kg/hr status post  100 mg lasix TID regimen yesterday. Net -3 L over the past 24. Still ~3 L net fluid positive since admission with drips and tube feeds. Patient is tolerating tube feeds well, still having bowel movements.  Overnight, patient did well.  Per nursing, his O2 sats decreased to 90-91%, after his as needed Versed/fentanyl.  FiO2 increased to 60% from 50%.  ABG from this morning read: 7.446/43.4/64/29.8.  Electrolytes are stable after repletion yesterday.   Objective   Blood pressure 139/79, pulse 64, temperature 99 F (37.2 C), resp. rate (!) 27, height 5\' 7"  (1.702 m), weight 125.8 kg, SpO2 91 %.    Vent Mode: PRVC FiO2 (%):  [50 %-60 %] 60 % Set Rate:  [27 bmp-30 bmp] 27 bmp Vt Set:  [550 mL] 550 mL PEEP:  [8 cmH20-10 cmH20] 8 cmH20 Plateau Pressure:  [20 cmH20-30 cmH20] 21 cmH20   Intake/Output Summary (Last 24 hours) at 09/08/2022 0748 Last data filed at 09/08/2022 0700 Gross per 24 hour  Intake 2898.25 ml  Output  5820 ml  Net -2921.75 ml    Filed Weights   09/06/22 0500 09/07/22 0344 09/08/22 0300  Weight: 132.6 kg 132.6 kg 125.8 kg    Examination:  General: large body habitus, intubated, laying towards left  side, unresponsive to pain/verbal stimuli HEENT:  ET tube in place, moist, somewhat enlarged tongue, no lesions, lateral left eye scleral extrusion still presen, but improved as fluid status has gotten better  Pulmonary: CTAB in bilateral apical lung fields, synchronous with vent on lowered sedation, diminished breath sounds in lower lung fields bilaterally Cardiac: regular rate and rhythm, no obvious murmurs/rubs/gallops appreciated Abdomen: obese, slightly distended, slightly tympanic, paraumbilical hernia Extremities trace peripheral edema in upper and lower extremities Neuo: sedated, moves right side when turned per nursing  Resolved Hospital Problem list   Hypertensive emergency   Assessment & Plan:  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 Neuro exam still limited given elevated midazolam needs.  On transcranial VASC US Doppler ultrasound, poor windows throughout greatly limit exam. antegrade flow in right  opthalmic and bilateral vertebral arteries was noted. Per nursing patient only uses right side when agitated (noticed when turning and with oral hygiene care).  Cleviprex stopped on 10/4. - Goal: Down-titrate sedation to better neurological status -Continue lopressor 50 mg BID per tube -Continue scheduled nimodipine 60 mg per tube, q 4hrs -Continue hydralazine to 50 mg q 8hrs per tube -Continue clonidine 0.1 mg BID per day -Serial neurochecks -SBP goal  <160  - Continue with heparin for DVT prophylaxis - Goals of care conversation with family  Acute hypoxic respiratory failure w/ aspiration PNA/ ALI Self extubation 9/29, required reintubation Respiratory acidosis Patient ABG of 7.446/43 point 4/64/20 9.8 on current vent settings of FiO2 60%, respiratory rate 27, VT 550 mL, PEEP 8.  Plateau pressure 21.  At bedside, PEEP increased to 10 d/t persistence of lower  O2 saturations. On day 7/7 of antibiotics.  Respiratory status likely influenced by overloaded fluid status ( + ~3L), was net negative by 3 L yesterday.   -Continue to diurese, and offload fluid. -Continue DuoNeb every 4 hours/albuterol every 2 hours as needed  - Wean  FiO2 as able, maintain PEEP at 10 and ARDS precautions right now -  Day 7/7 cefazolin 1gram q12hrs  - Gradually wean Midazolam/Fentanyl to further assess Neuro status - Consider CT Head if patient is still unresponsive s/p sedation weaning - Continue PO NAHCO3 1300mg  TID,  - Decrease obligate input IV fluids as able  - Follow ABG   CLL Flow cytometry showed monoclonal B-cell population, consistent with chronic lymphocytic leukemia.  CD5, CD19, CD20, CD200, and kappa cells identified.  WBCs at 45.7  from 44.3.  Platelets at 161 from 134.  Hemoglobin stable at 11.8. -No acute interventions, will need to be followed in outpatient setting -Trend CBC  AKI Creatinine at 2.59 from 3.10, BUN at 119 from 116  and the patient responded well to 100 mg 3 times daily of Lasix yesterday, UOP at 5.35 L, and 1.8 cc/kg/hr for the past 24 hours, net negative of 3 L over the past 24 hours. Sodium stable at 147, Potassium at 4.5 from 2.4, magnesium at 2.8 from 1.9, Calcium at 8.8. Likely ATN 2/2 to contrast + VDRF.   - Nephro was previously following and they have signed off for now given significantly improved UOP and improving creatinine - Continue PO NAHCO3 1300mg   TID - Decrease lasix to 80 mg BID - Daily renal panel - Replete electrolytes PRN - Strict I's and O's - Avoid nephrotoxins - Decrease obligate input IV fluids as able, in order to decrease fluid status  Hyperglycemia  -CBG, with goal of 140-180 -Sliding scale insulin  Bowel motility - Bowel regimen PRN given loose stools - Tube feeds at 51ml/hr, continue  Best Practice (right click and "Reselect all SmartList Selections" daily)   Diet/type: tubefeeds DVT prophylaxis:  systemic heparin GI prophylaxis: PPI Lines: Arterial Line and yes and it is still needed Foley:  Yes, and it is still needed Code Status:  full code  Philis Kendall MS4, [facility]

## 2022-09-08 NOTE — Progress Notes (Signed)
Pt very dyssynchronous on the vent and Art line BPs elevated. Therefore, pt's sedative meds titrated up as warranted. Will conti to monitor.

## 2022-09-09 ENCOUNTER — Inpatient Hospital Stay (HOSPITAL_COMMUNITY): Payer: Medicaid Other

## 2022-09-09 ENCOUNTER — Other Ambulatory Visit (HOSPITAL_COMMUNITY): Payer: Self-pay

## 2022-09-09 LAB — CBC WITH DIFFERENTIAL/PLATELET
Abs Immature Granulocytes: 0 10*3/uL (ref 0.00–0.07)
Basophils Absolute: 0 10*3/uL (ref 0.0–0.1)
Basophils Relative: 0 %
Eosinophils Absolute: 0 10*3/uL (ref 0.0–0.5)
Eosinophils Relative: 0 %
HCT: 37.4 % — ABNORMAL LOW (ref 39.0–52.0)
Hemoglobin: 11.7 g/dL — ABNORMAL LOW (ref 13.0–17.0)
Lymphocytes Relative: 85 %
Lymphs Abs: 41 10*3/uL — ABNORMAL HIGH (ref 0.7–4.0)
MCH: 30.5 pg (ref 26.0–34.0)
MCHC: 31.3 g/dL (ref 30.0–36.0)
MCV: 97.4 fL (ref 80.0–100.0)
Monocytes Absolute: 1 10*3/uL (ref 0.1–1.0)
Monocytes Relative: 2 %
Neutro Abs: 6.3 10*3/uL (ref 1.7–7.7)
Neutrophils Relative %: 13 %
Platelets: 147 10*3/uL — ABNORMAL LOW (ref 150–400)
RBC: 3.84 MIL/uL — ABNORMAL LOW (ref 4.22–5.81)
RDW: 14.2 % (ref 11.5–15.5)
WBC: 48.2 10*3/uL — ABNORMAL HIGH (ref 4.0–10.5)
nRBC: 0 % (ref 0.0–0.2)

## 2022-09-09 LAB — BLOOD CULTURE ID PANEL (REFLEXED) - BCID2

## 2022-09-09 LAB — FLOW CYTOMETRY

## 2022-09-09 LAB — POCT I-STAT 7, (LYTES, BLD GAS, ICA,H+H)
Acid-Base Excess: 9 mmol/L — ABNORMAL HIGH (ref 0.0–2.0)
Bicarbonate: 34.5 mmol/L — ABNORMAL HIGH (ref 20.0–28.0)
Calcium, Ion: 1.2 mmol/L (ref 1.15–1.40)
HCT: 34 % — ABNORMAL LOW (ref 39.0–52.0)
Hemoglobin: 11.6 g/dL — ABNORMAL LOW (ref 13.0–17.0)
O2 Saturation: 99 %
Patient temperature: 37.3
Potassium: 4.6 mmol/L (ref 3.5–5.1)
Sodium: 151 mmol/L — ABNORMAL HIGH (ref 135–145)
TCO2: 36 mmol/L — ABNORMAL HIGH (ref 22–32)
pCO2 arterial: 52.8 mmHg — ABNORMAL HIGH (ref 32–48)
pH, Arterial: 7.424 (ref 7.35–7.45)
pO2, Arterial: 128 mmHg — ABNORMAL HIGH (ref 83–108)

## 2022-09-09 LAB — GLUCOSE, CAPILLARY
Glucose-Capillary: 176 mg/dL — ABNORMAL HIGH (ref 70–99)
Glucose-Capillary: 158 mg/dL — ABNORMAL HIGH (ref 70–99)
Glucose-Capillary: 170 mg/dL — ABNORMAL HIGH (ref 70–99)
Glucose-Capillary: 182 mg/dL — ABNORMAL HIGH (ref 70–99)
Glucose-Capillary: 218 mg/dL — ABNORMAL HIGH (ref 70–99)
Glucose-Capillary: 253 mg/dL — ABNORMAL HIGH (ref 70–99)

## 2022-09-09 LAB — RENAL FUNCTION PANEL
Albumin: 2.6 g/dL — ABNORMAL LOW (ref 3.5–5.0)
Anion gap: 10 (ref 5–15)
BUN: 118 mg/dL — ABNORMAL HIGH (ref 8–23)
CO2: 31 mmol/L (ref 22–32)
Calcium: 9.1 mg/dL (ref 8.9–10.3)
Chloride: 110 mmol/L (ref 98–111)
Creatinine, Ser: 2.26 mg/dL — ABNORMAL HIGH (ref 0.61–1.24)
GFR, Estimated: 32 mL/min — ABNORMAL LOW (ref >60)
Glucose, Bld: 278 mg/dL — ABNORMAL HIGH (ref 70–99)
Phosphorus: 4.8 mg/dL — ABNORMAL HIGH (ref 2.5–4.6)
Potassium: 5.3 mmol/L — ABNORMAL HIGH (ref 3.5–5.1)
Sodium: 151 mmol/L — ABNORMAL HIGH (ref 135–145)

## 2022-09-09 LAB — PATHOLOGIST SMEAR REVIEW

## 2022-09-09 MED ORDER — CLEVIDIPINE BUTYRATE 0.5 MG/ML IV EMUL
INTRAVENOUS | Status: AC
  Administered 2022-09-09: 2 mg/h via INTRAVENOUS
  Filled 2022-09-09: qty 100

## 2022-09-09 MED ORDER — FREE WATER
200.0000 mL | Status: DC
  Administered 2022-09-09 – 2022-09-11 (×12): 200 mL

## 2022-09-09 MED ORDER — RACEPINEPHRINE HCL 2.25 % IN NEBU
0.5000 mL | INHALATION_SOLUTION | Freq: Once | RESPIRATORY_TRACT | Status: AC
  Administered 2022-09-09: 0.5 mL via RESPIRATORY_TRACT
  Filled 2022-09-09: qty 0.5

## 2022-09-09 MED ORDER — MAGNESIUM SULFATE 2 GM/50ML IV SOLN
2.0000 g | Freq: Once | INTRAVENOUS | Status: AC
  Administered 2022-09-09: 2 g via INTRAVENOUS
  Filled 2022-09-09: qty 50

## 2022-09-09 MED ORDER — CLEVIDIPINE BUTYRATE 0.5 MG/ML IV EMUL
0.0000 mg/h | INTRAVENOUS | Status: DC
  Administered 2022-09-09: 6 mg/h via INTRAVENOUS
  Administered 2022-09-10: 2 mg/h via INTRAVENOUS
  Administered 2022-09-10: 6 mg/h via INTRAVENOUS
  Administered 2022-09-10: 19 mg/h via INTRAVENOUS
  Administered 2022-09-10: 1 mg/h via INTRAVENOUS
  Administered 2022-09-10: 15 mg/h via INTRAVENOUS
  Administered 2022-09-10: 16 mg/h via INTRAVENOUS
  Administered 2022-09-11: 2 mg/h via INTRAVENOUS
  Administered 2022-09-12 (×2): 9 mg/h via INTRAVENOUS
  Administered 2022-09-12: 1 mg/h via INTRAVENOUS
  Administered 2022-09-13: 14 mg/h via INTRAVENOUS
  Administered 2022-09-13: 12 mg/h via INTRAVENOUS
  Administered 2022-09-13: 16 mg/h via INTRAVENOUS
  Administered 2022-09-13: 9 mg/h via INTRAVENOUS
  Administered 2022-09-13: 12 mg/h via INTRAVENOUS
  Administered 2022-09-14 (×2): 21 mg/h via INTRAVENOUS
  Administered 2022-09-14: 15 mg/h via INTRAVENOUS
  Administered 2022-09-14: 16 mg/h via INTRAVENOUS
  Administered 2022-09-14: 19 mg/h via INTRAVENOUS
  Administered 2022-09-14: 14 mg/h via INTRAVENOUS
  Administered 2022-09-14: 10 mg/h via INTRAVENOUS
  Administered 2022-09-15: 21 mg/h via INTRAVENOUS
  Filled 2022-09-09 (×4): qty 100
  Filled 2022-09-09 (×2): qty 50
  Filled 2022-09-09 (×3): qty 100
  Filled 2022-09-09: qty 50
  Filled 2022-09-09 (×10): qty 100
  Filled 2022-09-09: qty 50
  Filled 2022-09-09 (×5): qty 100

## 2022-09-09 MED ORDER — SODIUM ZIRCONIUM CYCLOSILICATE 10 G PO PACK
10.0000 g | PACK | Freq: Once | ORAL | Status: AC
  Administered 2022-09-09: 10 g
  Filled 2022-09-09: qty 1

## 2022-09-09 MED ORDER — CLONAZEPAM 0.1 MG/ML ORAL SUSPENSION: 1.0000 mg | Freq: Two times a day (BID) | ORAL | Status: DC

## 2022-09-09 MED ORDER — REVEFENACIN 175 MCG/3ML IN SOLN
175.0000 ug | Freq: Every day | RESPIRATORY_TRACT | Status: DC
  Administered 2022-09-09 – 2022-11-09 (×59): 175 ug via RESPIRATORY_TRACT
  Filled 2022-09-09 (×59): qty 3

## 2022-09-09 MED ORDER — MIDAZOLAM HCL 2 MG/2ML IJ SOLN
4.0000 mg | Freq: Once | INTRAMUSCULAR | Status: AC
  Administered 2022-09-09: 4 mg via INTRAVENOUS

## 2022-09-09 MED ORDER — OXYCODONE HCL 5 MG/5ML PO SOLN
5.0000 mg | Freq: Four times a day (QID) | ORAL | Status: DC
  Administered 2022-09-09 – 2022-09-15 (×19): 5 mg
  Filled 2022-09-09 (×20): qty 5

## 2022-09-09 MED ORDER — MIDAZOLAM BOLUS VIA INFUSION
4.0000 mg | INTRAVENOUS | Status: DC | PRN
  Administered 2022-09-10: 4 mg via INTRAVENOUS
  Administered 2022-09-10: 2 mg via INTRAVENOUS

## 2022-09-09 MED ORDER — FENTANYL BOLUS VIA INFUSION
100.0000 ug | INTRAVENOUS | Status: DC | PRN
  Administered 2022-09-10 – 2022-09-11 (×6): 100 ug via INTRAVENOUS

## 2022-09-09 MED ORDER — HYDRALAZINE HCL 50 MG PO TABS: 100.0000 mg | ORAL_TABLET | Freq: Three times a day (TID) | ORAL | Status: DC

## 2022-09-09 MED ORDER — MIDAZOLAM HCL 2 MG/2ML IJ SOLN
2.0000 mg | Freq: Once | INTRAMUSCULAR | Status: AC
  Administered 2022-09-09: 2 mg via INTRAVENOUS

## 2022-09-09 MED ORDER — HYDRALAZINE HCL 50 MG PO TABS
100.0000 mg | ORAL_TABLET | Freq: Three times a day (TID) | ORAL | Status: DC
  Administered 2022-09-09 – 2022-09-13 (×13): 100 mg
  Filled 2022-09-09 (×13): qty 2

## 2022-09-09 MED ORDER — CLONAZEPAM 0.25 MG PO TBDP
1.0000 mg | ORAL_TABLET | Freq: Two times a day (BID) | ORAL | Status: DC
  Administered 2022-09-09 – 2022-09-16 (×13): 1 mg
  Filled 2022-09-09 (×13): qty 4

## 2022-09-09 MED ORDER — ARFORMOTEROL TARTRATE 15 MCG/2ML IN NEBU
15.0000 ug | INHALATION_SOLUTION | Freq: Two times a day (BID) | RESPIRATORY_TRACT | Status: DC
  Administered 2022-09-09 – 2022-11-09 (×121): 15 ug via RESPIRATORY_TRACT
  Filled 2022-09-09 (×122): qty 2

## 2022-09-09 MED ORDER — METHYLPREDNISOLONE SODIUM SUCC 40 MG IJ SOLR
40.0000 mg | Freq: Every day | INTRAMUSCULAR | Status: DC
  Administered 2022-09-09 – 2022-09-14 (×6): 40 mg via INTRAVENOUS
  Filled 2022-09-09 (×6): qty 1

## 2022-09-09 NOTE — Progress Notes (Signed)
ASSESSMENT CHANGE D/T INCREASED SEDATION REQUIREMENTS

## 2022-09-09 NOTE — Progress Notes (Signed)
[facility] Progress Note Patient Name: GLENDON DUNWOODY DOB: 09-18-1961 MRN: [id number]   Date of Service  09/09/2022  HPI/Events of Note  Agitation - Nursing request to renew soft R wrist restraint.   eICU Interventions  Will renew soft R wrist restraint X 13 hours.      Intervention Category Major Interventions: Delirium, psychosis, severe agitation - evaluation and management  Dezmen Alcock [place] 09/09/2022, 7:54 PM

## 2022-09-09 NOTE — Procedures (Addendum)
Bedside Bronchoscopy Procedure Note Dustin Jones [id number] 25-Jan-1961  Procedure: Bronchoscopy Indications: Diagnostic evaluation of the airways and Remove secretions  Procedure Details: ET Tube Size:7.5 ET Tube secured at lip (cm):24 Bite block in place: No In preparation for procedure, Patient hyper-oxygenated with 100 % FiO2 Airway entered and the following bronchi were examined: RUL, RML, RLL, LUL, and LLL.   Bronchoscope removed.  , Patient placed back on 100% FiO2 at conclusion of procedure.    Evaluation BP 133/67   Pulse 100   Temp (!) 100.6 F (38.1 C)   Resp (!) 23   Ht 5\' 7"  (1.702 m)   Wt 127.3 kg   SpO2 95%   BMI 43.96 kg/m  Breath Sounds:Rhonch and Expiratory wheezes O2 sats: stable throughout Patient's Current Condition: stable Specimens:  None Complications: No apparent complications Patient did tolerate procedure well.   [name] 09/09/2022, 12:25 PM

## 2022-09-09 NOTE — Progress Notes (Signed)
SLP Cancellation Note  Patient Details Name: Dustin Jones MRN: [id number] DOB: [birth year]   Cancelled treatment:       Reason Eval/Treat Not Completed: Patient not medically ready (remains on vent). Will f/u as able.    Osie Bond., M.A. [place] Office [phone]  Secure chat preferred  09/09/2022, 7:54 AM

## 2022-09-09 NOTE — Progress Notes (Addendum)
[facility] Progress Note Patient Name: Dustin Jones DOB: May 16, 1961 MRN: [id number]   Date of Service  09/09/2022  HPI/Events of Note  Hyperkalemia - K+ = 5.3 and Creatinine = 2.26.   eICU Interventions  Plan: Lokelma 10 gm per tube now. Renal panel already ordered for 5 AM.     Intervention Category Major Interventions: Electrolyte abnormality - evaluation and management  Dustin Jones 09/09/2022, 1:26 AM

## 2022-09-09 NOTE — Progress Notes (Signed)
CPT held at this time due to agitation from previous CPT.

## 2022-09-09 NOTE — Progress Notes (Addendum)
NAME:  Dustin Jones, MRN:  [id number], DOB:  06-15-1961, LOS: 8 ADMISSION DATE:  08/08/2022, CONSULTATION DATE:  08/31/2022 REFERRING MD:  Maryan Rued, MD, CHIEF COMPLAINT:  Intracranial hemorrhage   History of Present Illness:  Patient with a history of hypertension, DVT and chronic tobacco use originally presented to [facility] ED as a code stroke  He was last seen walking around at 3 am. Found down  at 8 am by bystanders who called 911. EMS reported the pt had L-sided deficits and AMS. Systolic BP reported to be in the 190's per EMS. CT imaging in ED revealed acute intraparenchymal hemorrhage with possible lobulated 9mm outpouching arising from the supraclinoid right ICA that is suspicious for ruptured aneurysm. Was admitted by Neurosurgery for close monitoring and further diagnostic testing. CCM was consulted for ventilator management.   Pertinent  Medical History  Hypertension History of DVT Hx Etoh use, not quantified HX of Tobacco use, > 25 pack years  Significant Hospital Events: Including procedures, antibiotic start and stop dates in addition to other pertinent events   9/28 admitted. Intracranial Hemorrhage/ R MCA ruptured aneurysm Non-con CT of head demonstrating large acute hemorrhage in the R lentiform nucleus with with intraventricular extension, midline shift, and slight rounding of the temporal horns, possibly representing early hydrocephalus. On CTA, pt found to have a R MCA ruptured aneurysm. Neurosurgery consulted and following with plans for possible arterial clipping this evening. CT of the face and cervical spine did not show any acute traumatic injuries, though BL cervical lymphadenopathy was noted. Echo with normal biventricular function without evidence of hemodynamically sig valvular heart disease.  9/28 complete echo with LVEF of 60-65%, no regional wall motion abnormalities but with grade 1 diastolic dysfunction.  Normal right ventricular systolic function.  No valvular  abnormalities. 9/29 MRI brain 1. Large acute intraparenchymal hemorrhage centered in the rightlentiform nucleus, with extension into the right-greater-than-left lateral ventricle, third ventricle, and fourth ventricle, overall similar to the prior CT. Mild mass effect and 5 mm of right-to-left midline shift. No hydrocephalus. 2. Small areas of acute infarct in the bilateral occipital lobes, bilateral frontal lobes, and right parietal lobe. Spiking fever. Has sig leukocytosis. Cultures sent. Unasyn started for aspiration  9/29 respiratory culture positive for MSSA 9/29 self extubated, required reintubation 10/2 Family identified   Interim History / Subjective:  Overnight, per nursing, after lavage from respiratory therapy patient was dyssynchronous with the vent, and sedation had to be significantly increased.  Overnight provider was paged about patient's temperature to 100.9 F and elevated potassium.  Acetaminophen and ice packs were ordered/administered, blood cultures/tracheal aspirate samples obtained, Lokelma ordered/administered with repeat a.m. BMP scheduled.   Patient continues to put out adequate amount of urine with 5.2L over the past 24 hours at 1.7 cc/kg/hr status post 80 mg Lasix twice daily regimen yesterday. Net -3.6 L over the past 24, and now net negative since admission. Patient is tolerating tube feeds well, still having bowel movements.  ABG from this morning read 7.42/52.8/128/34.5.   Objective   Blood pressure 125/70, pulse 65, temperature 99.1 F (37.3 C), resp. rate (!) 27, height 5\' 7"  (1.702 m), weight 127.3 kg, SpO2 94 %.    Vent Mode: PRVC FiO2 (%):  [60 %] 60 % Set Rate:  [27 bmp] 27 bmp Vt Set:  [550 mL] 550 mL PEEP:  [8 cmH20-10 cmH20] 10 cmH20 Plateau Pressure:  [20 cmH20-26 cmH20] 26 cmH20   Intake/Output Summary (Last 24 hours) at 09/09/2022 0717 Last data  filed at 09/09/2022 0700 Gross per 24 hour  Intake 1767.72 ml  Output 5350 ml  Net -3582.28 ml     Filed Weights   09/07/22 0344 09/08/22 0300 09/09/22 0400  Weight: 132.6 kg 125.8 kg 127.3 kg    Examination:  General: large body habitus, diaphoretic,  intubated, unresponsive to pain/verbal stimuli HEENT:  ET tube in place, moist, somewhat enlarged tongue, no lesions,  Pulmonary: CTAB in bilateral apical lung fields, synchronous with vent, diminished breath sounds in lower lung fields bilaterally Cardiac: regular rate and rhythm, no obvious murmurs/rubs/gallops appreciated Abdomen: obese, slightly distended, slightly tympanic, paraumbilical hernia Extremities trace peripheral edema in upper and lower extremities Neuo: Sedated, not responsive to stimuli  Resolved Hospital Problem list   Hypertensive emergency   Assessment & Plan:  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 Neuro exam still limited given elevated midazolam needs.  On transcranial VASC US Doppler ultrasound, poor windows throughout greatly limit exam. antegrade flow in right  opthalmic and bilateral vertebral arteries was noted. Per nursing patient only uses right side when agitated (noticed when turning and with oral hygiene care).  Cleviprex stopped on 10/4.  CT head obtained yesterday given patient's lack of responsiveness despite titration of sedation.  This scan demonstrated no change in previously identified intraparenchymal hemorrhage, with some increase in the surrounding vasogenic edema but no new acute intracranial pathology.  Same 5 mm midline shift present. Hypertensive to systolic of [id number] during rounds, restarted Cleviprex for acute management, with end goal of up titrating PO medications.  - Goal: Transition sedation to PO Clonazepam 1mg  q12 per tube with PRN Midazolam, while weaning IV Midazolam -Continue lopressor 50 mg BID per tube -Continue scheduled nimodipine 60 mg per  tube, q 4hrs -Increase hydralazine to 10 mg q 8hrs per tube -Continue clonidine 0.1 mg BID per day -Serial neurochecks -SBP goal  <160  - Continue with heparin for DVT prophylaxis  Acute hypoxic respiratory failure w/ aspiration PNA/ ALI Self extubation 9/29, required reintubation Respiratory acidosis Patient ABG of 7.42/52.8/128/34.5 on current vent settings of FiO2 60%, respiratory rate 27, VT 550 mL, PEEP 10.  Patient is now fluid net negative for this hospitalization, and put out 5.2 L yesterday with a day net negative of -3.6 L -Continue DuoNeb every 4 hours/albuterol every 2 hours as needed  - Wean  FiO2 as able, continue with current vent settings - Stop steroids - Stop PO NAHCO3  - Decrease obligate input IV fluids as able  - Follow ABG   Fever  CLL Flow cytometry showed monoclonal B-cell population, consistent with chronic lymphocytic leukemia.  CD5, CD19, CD20, CD200, and kappa cells identified.  WBC's at 48.2 from 45.7. Hgb stable, patient had temperature to 100.9 overnight and was given acetaminophen/ice packs. BCX and Tracheal aspirate cultures obtained.  -No acute interventions, will need to be followed by heme/onc in outpatient setting - Trend CBC - Follow Bcx - Follow tracheal aspirate  AKI  Electrolyte derangement Continuing to improve status post diuresis regimen, now with hypernatremia.   Creatinine at 2.26 from 2.59, BUN at 118 from 119, potassium at 5.3 from 4.6, sodium at 151 from 147.  UOP at 5.2L, and 1.7 cc/kg/hr for the past 24 hours, net negative of 3.6L over the past 24 hours.  Patient is now fluid net negative for this hospitalization.  Lokelma was ordered overnight for hyperkalemia.  Electrolyte  abnormalities likely secondary to fluid shifts, and notable fluid loss. - Nephro was previously following and they have signed off for now given significantly improved UOP and improving creatinine -Follow potassium status post Lokelma - Provide free water via OG  tube at 2100ml q4hrs for hypernatremia - Stop PO NAHCO3  - Continue lasix 80 mg BID - Daily renal panel - Replete electrolytes PRN - Strict I's and O's - Avoid nephrotoxins - Decrease obligate input IV fluids as able, in order to decrease fluid status  Hyperglycemia  Patient persistently hyperglycemic despite insulin regimen adjustments.  - Stop  steroids, assess glucose s/p cessation -CBG, with goal of 140-180 - Regimen: -Sliding scale NovoLog 0 to 15 units -NovoLog 4 units as needed every 4 hours -Semglee injection 10 units daily  Bowel motility - Bowel regimen PRN given loose stools - Tube feeds at 66ml/hr, continue  Best Practice (right click and "Reselect all SmartList Selections" daily)   Diet/type: tubefeeds DVT prophylaxis: systemic heparin GI prophylaxis: PPI Lines: Arterial Line and yes and it is still needed Foley:  Yes, and it is still needed Code Status:  full code  Damaris Schooner MS4, [facility]

## 2022-09-09 NOTE — Progress Notes (Signed)
Nutrition Follow-up  DOCUMENTATION CODES:   Obesity unspecified  INTERVENTION:   Tube feeding via OG tube:  Osmolite 1.5 at 55 ml/h (1320 ml per day) Prosource TF20 60 ml BID  Provides 2140 kcal, 122 gm protein, 1003 ml free water daily  200 ml free water every 4 hours Total free water: 2203 ml   NUTRITION DIAGNOSIS:   Inadequate oral intake related to inability to eat as evidenced by NPO status. Ongoing.   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   Pt discussed during ICU rounds and with RN and MD.  Pt on vent support with fiO2 of 60%, pt treated for ARDS and suspected aspiration PNA. Plan to attempt weaning as he has been diuresed.  Free water added for hypernatremia Pt with persistently hyperglycemia, steroids stopped and insulin adjusted.   Per MD pt with severe leukocytosis/lymphocytosis with suspected underlying hematological malignancy.  Pt with elevated blood sugars, steroids  9/29 TF started 9/30 TF changed to Nepro providing: 2456 kcal and 127 grams of protein  10/2 TF changed to Osmolite 1.5  Medications reviewed and include: banatrol TF, lasix 80 mg BID, SSI, 4 units novolog every 4 hours, 10 units semglee daily, nimotop, protonix Cleviprex @ 16 ml/hr provides: 768 kcal  Fentanyl  Versed   Labs reviewed: Na 151, PO4 4.8 A1C: 6.5 CBG: 158-287  16 F OG tube; per xray tube is gastric  UOP: 5225 ml    Diet Order:   Diet Order             Diet NPO time specified  Diet effective now                   EDUCATION NEEDS:   No education needs have been identified at this time  Skin:  Skin Assessment:  Skin Integrity Issues: Skin Integrity Issues:: Stage II Stage II: R leg (pretibial) - old healing ulcer  Last BM:  125 ml via rectal tube  Height:   Ht Readings from Last 1 Encounters:  08/15/2022 5' 7"  (1.702 m)    Weight:   Wt Readings from Last 1 Encounters:  09/09/22 127.3 kg    BMI:  Body mass index is 43.96 kg/m.  Estimated Nutritional Needs:   Kcal:  2000-2200  Protein:  100-120 grams  Fluid:  >2 L/day  Lockie Pares., RD, LDN, CNSC See AMiON for contact information

## 2022-09-09 NOTE — Progress Notes (Signed)
Pt w/ whipping Art line wave after pt being repositioned. Elink informed and if needed a prn anti-hypertensive to be requested by [facility] RN. Collaboration appreciated.

## 2022-09-09 NOTE — Progress Notes (Signed)
PHARMACY - PHYSICIAN COMMUNICATION CRITICAL VALUE ALERT - BLOOD CULTURE IDENTIFICATION (BCID)  Dustin Jones is an 62 y.o. male who presented to [facility] on 2022-09-28 with a chief complaint of ICH  Assessment:  Pt with 1/3 blood cx bottles growing staph epi - likely contaminant  Name of physician (or Provider) Contacted: Dr. Oletta Darter  Current antibiotics: None  Changes to prescribed antibiotics recommended:  No antibiotics at this time  Results for orders placed or performed during the hospital encounter of 2022/09/28  Blood Culture ID Panel (Reflexed) (Collected: 09/09/2022 12:32 AM)  Result Value Ref Range   Enterococcus faecalis NOT DETECTED NOT DETECTED   Enterococcus Faecium NOT DETECTED NOT DETECTED   Listeria monocytogenes NOT DETECTED NOT DETECTED   Staphylococcus species DETECTED (A) NOT DETECTED   Staphylococcus aureus (BCID) NOT DETECTED NOT DETECTED   Staphylococcus epidermidis DETECTED (A) NOT DETECTED   Staphylococcus lugdunensis NOT DETECTED NOT DETECTED   Streptococcus species NOT DETECTED NOT DETECTED   Streptococcus agalactiae NOT DETECTED NOT DETECTED   Streptococcus pneumoniae NOT DETECTED NOT DETECTED   Streptococcus pyogenes NOT DETECTED NOT DETECTED   A.calcoaceticus-baumannii NOT DETECTED NOT DETECTED   Bacteroides fragilis NOT DETECTED NOT DETECTED   Enterobacterales NOT DETECTED NOT DETECTED   Enterobacter cloacae complex NOT DETECTED NOT DETECTED   Escherichia coli NOT DETECTED NOT DETECTED   Klebsiella aerogenes NOT DETECTED NOT DETECTED   Klebsiella oxytoca NOT DETECTED NOT DETECTED   Klebsiella pneumoniae NOT DETECTED NOT DETECTED   Proteus species NOT DETECTED NOT DETECTED   Salmonella species NOT DETECTED NOT DETECTED   Serratia marcescens NOT DETECTED NOT DETECTED   Haemophilus influenzae NOT DETECTED NOT DETECTED   Neisseria meningitidis NOT DETECTED NOT DETECTED   Pseudomonas aeruginosa NOT DETECTED NOT DETECTED   Stenotrophomonas  maltophilia NOT DETECTED NOT DETECTED   Candida albicans NOT DETECTED NOT DETECTED   Candida auris NOT DETECTED NOT DETECTED   Candida glabrata NOT DETECTED NOT DETECTED   Candida krusei NOT DETECTED NOT DETECTED   Candida parapsilosis NOT DETECTED NOT DETECTED   Candida tropicalis NOT DETECTED NOT DETECTED   Cryptococcus neoformans/gattii NOT DETECTED NOT DETECTED   Methicillin resistance mecA/C NOT DETECTED NOT DETECTED    Sherlon Handing, PharmD, BCPS Please see amion for complete clinical pharmacist phone list 09/09/2022  8:43 PM

## 2022-09-09 NOTE — Procedures (Signed)
Bronchoscopy Procedure Note  Dustin Jones  [id number]  1961/06/13  Date:09/09/22  Time:3:50 PM   Provider Performing:Isair Inabinet R Bennye Nix   Procedure(s):  Flexible Bronchoscopy (30940)  Indication(s) Desaturation, rising inspiratory pressures, concern for mucous plug  Consent Unable to obtain consent due to emergent nature of procedure.  Anesthesia N/a   Time Out Verified patient identification, verified procedure, site/side was marked, verified correct patient position, special equipment/implants available, medications/allergies/relevant history reviewed, required imaging and test results available.   Sterile Technique Usual hand hygiene, masks, gowns, and gloves were used   Procedure Description Bronchoscope advanced through endotracheal tube and into airway.  Airways were examined down to subsegmental level with findings noted below.     Findings: ET tube clear, extrinsic compression of ET tube posterior OP. Clear lungs with normal anatomy, erythematous mucosa.   Complications/Tolerance None; patient tolerated the procedure well. Chest X-ray is not needed post procedure.   EBL none   Specimen(s) N/a

## 2022-09-10 LAB — RENAL FUNCTION PANEL
Albumin: 2.7 g/dL — ABNORMAL LOW (ref 3.5–5.0)
Anion gap: 11 (ref 5–15)
BUN: 103 mg/dL — ABNORMAL HIGH (ref 8–23)
CO2: 31 mmol/L (ref 22–32)
Calcium: 8.8 mg/dL — ABNORMAL LOW (ref 8.9–10.3)
Chloride: 112 mmol/L — ABNORMAL HIGH (ref 98–111)
Creatinine, Ser: 1.79 mg/dL — ABNORMAL HIGH (ref 0.61–1.24)
GFR, Estimated: 43 mL/min — ABNORMAL LOW (ref >60)
Glucose, Bld: 219 mg/dL — ABNORMAL HIGH (ref 70–99)
Phosphorus: 3.8 mg/dL (ref 2.5–4.6)
Potassium: 4.3 mmol/L (ref 3.5–5.1)
Sodium: 154 mmol/L — ABNORMAL HIGH (ref 135–145)

## 2022-09-10 LAB — GLUCOSE, CAPILLARY
Glucose-Capillary: 153 mg/dL — ABNORMAL HIGH (ref 70–99)
Glucose-Capillary: 179 mg/dL — ABNORMAL HIGH (ref 70–99)
Glucose-Capillary: 187 mg/dL — ABNORMAL HIGH (ref 70–99)
Glucose-Capillary: 198 mg/dL — ABNORMAL HIGH (ref 70–99)
Glucose-Capillary: 257 mg/dL — ABNORMAL HIGH (ref 70–99)
Glucose-Capillary: 268 mg/dL — ABNORMAL HIGH (ref 70–99)

## 2022-09-10 LAB — POCT I-STAT 7, (LYTES, BLD GAS, ICA,H+H)
Acid-Base Excess: 7 mmol/L — ABNORMAL HIGH (ref 0.0–2.0)
Bicarbonate: 31.8 mmol/L — ABNORMAL HIGH (ref 20.0–28.0)
Calcium, Ion: 1.22 mmol/L (ref 1.15–1.40)
HCT: 36 % — ABNORMAL LOW (ref 39.0–52.0)
Hemoglobin: 12.2 g/dL — ABNORMAL LOW (ref 13.0–17.0)
O2 Saturation: 95 %
Patient temperature: 37.6
Potassium: 4.4 mmol/L (ref 3.5–5.1)
Sodium: 156 mmol/L — ABNORMAL HIGH (ref 135–145)
TCO2: 33 mmol/L — ABNORMAL HIGH (ref 22–32)
pCO2 arterial: 48.4 mmHg — ABNORMAL HIGH (ref 32–48)
pH, Arterial: 7.428 (ref 7.35–7.45)
pO2, Arterial: 80 mmHg — ABNORMAL LOW (ref 83–108)

## 2022-09-10 LAB — CBC WITH DIFFERENTIAL/PLATELET
Abs Immature Granulocytes: 0.16 10*3/uL — ABNORMAL HIGH (ref 0.00–0.07)
Basophils Absolute: 0.1 10*3/uL (ref 0.0–0.1)
Basophils Relative: 0 %
Eosinophils Absolute: 0 10*3/uL (ref 0.0–0.5)
Eosinophils Relative: 0 %
HCT: 41.6 % (ref 39.0–52.0)
Hemoglobin: 12.6 g/dL — ABNORMAL LOW (ref 13.0–17.0)
Immature Granulocytes: 0 %
Lymphocytes Relative: 82 %
Lymphs Abs: 39.8 10*3/uL — ABNORMAL HIGH (ref 0.7–4.0)
MCH: 30.1 pg (ref 26.0–34.0)
MCHC: 30.3 g/dL (ref 30.0–36.0)
MCV: 99.3 fL (ref 80.0–100.0)
Monocytes Absolute: 1 10*3/uL (ref 0.1–1.0)
Monocytes Relative: 2 %
Neutro Abs: 7.7 10*3/uL (ref 1.7–7.7)
Neutrophils Relative %: 16 %
Platelets: 173 10*3/uL (ref 150–400)
RBC: 4.19 MIL/uL — ABNORMAL LOW (ref 4.22–5.81)
RDW: 14.3 % (ref 11.5–15.5)
WBC: 48.8 10*3/uL — ABNORMAL HIGH (ref 4.0–10.5)
nRBC: 0 % (ref 0.0–0.2)

## 2022-09-10 LAB — CULTURE, BLOOD (ROUTINE X 2)

## 2022-09-10 MED ORDER — CEFAZOLIN SODIUM-DEXTROSE 1-4 GM/50ML-% IV SOLN: 1.0000 g | Freq: Three times a day (TID) | INTRAVENOUS | Status: DC

## 2022-09-10 MED ORDER — INSULIN GLARGINE-YFGN 100 UNIT/ML ~~LOC~~ SOLN
20.0000 [IU] | Freq: Every day | SUBCUTANEOUS | Status: DC
  Administered 2022-09-10 – 2022-09-12 (×3): 20 [IU] via SUBCUTANEOUS
  Filled 2022-09-10 (×4): qty 0.2

## 2022-09-10 MED ORDER — CEFAZOLIN SODIUM-DEXTROSE 2-4 GM/100ML-% IV SOLN
2.0000 g | Freq: Three times a day (TID) | INTRAVENOUS | Status: DC
  Administered 2022-09-10 – 2022-09-14 (×13): 2 g via INTRAVENOUS
  Filled 2022-09-10 (×13): qty 100

## 2022-09-10 MED ORDER — CLONIDINE HCL 0.2 MG PO TABS
0.2000 mg | ORAL_TABLET | Freq: Three times a day (TID) | ORAL | Status: DC
  Administered 2022-09-10 – 2022-09-12 (×7): 0.2 mg
  Filled 2022-09-10 (×7): qty 1

## 2022-09-10 MED ORDER — PROPOFOL 1000 MG/100ML IV EMUL
5.0000 ug/kg/min | INTRAVENOUS | Status: DC
  Administered 2022-09-10: 60 ug/kg/min via INTRAVENOUS
  Administered 2022-09-10: 10 ug/kg/min via INTRAVENOUS
  Administered 2022-09-10: 40 ug/kg/min via INTRAVENOUS
  Administered 2022-09-11: 20 ug/kg/min via INTRAVENOUS
  Administered 2022-09-11 (×2): 10 ug/kg/min via INTRAVENOUS
  Filled 2022-09-10 (×6): qty 100

## 2022-09-10 NOTE — Progress Notes (Signed)
NAME:  Dustin Jones, MRN:  [id number], DOB:  Dec 25, 1960, LOS: 9 ADMISSION DATE:  09-05-2022, CONSULTATION DATE:  09/05/2022 REFERRING MD:  Maryan Rued, MD, CHIEF COMPLAINT:  Intracranial hemorrhage   History of Present Illness:  Patient with a history of hypertension, DVT and chronic tobacco use originally presented to [facility] ED as a code stroke  He was last seen walking around at 3 am. Found down  at 8 am by bystanders who called 911. EMS reported the pt had L-sided deficits and AMS. Systolic BP reported to be in the 190's per EMS. CT imaging in ED revealed acute intraparenchymal hemorrhage with possible lobulated 75mm outpouching arising from the supraclinoid right ICA that is suspicious for ruptured aneurysm. Was admitted by Neurosurgery for close monitoring and further diagnostic testing. CCM was consulted for ventilator management.   Pertinent  Medical History  Hypertension History of DVT Hx Etoh use, not quantified HX of Tobacco use, > 25 pack years  Significant Hospital Events: Including procedures, antibiotic start and stop dates in addition to other pertinent events   9/28 admitted. Intracranial Hemorrhage/ R MCA ruptured aneurysm Non-con CT of head demonstrating large acute hemorrhage in the R lentiform nucleus with with intraventricular extension, midline shift, and slight rounding of the temporal horns, possibly representing early hydrocephalus. On CTA, pt found to have a R MCA ruptured aneurysm. Neurosurgery consulted and following with plans for possible arterial clipping this evening. CT of the face and cervical spine did not show any acute traumatic injuries, though BL cervical lymphadenopathy was noted. Echo with normal biventricular function without evidence of hemodynamically sig valvular heart disease.  9/28 complete echo with LVEF of 60-65%, no regional wall motion abnormalities but with grade 1 diastolic dysfunction.  Normal right ventricular systolic function.  No valvular  abnormalities. 9/29 MRI brain 1. Large acute intraparenchymal hemorrhage centered in the rightlentiform nucleus, with extension into the right-greater-than-left lateral ventricle, third ventricle, and fourth ventricle, overall similar to the prior CT. Mild mass effect and 5 mm of right-to-left midline shift. No hydrocephalus. 2. Small areas of acute infarct in the bilateral occipital lobes, bilateral frontal lobes, and right parietal lobe. Spiking fever. Has sig leukocytosis. Cultures sent. Unasyn started for aspiration  9/29 respiratory culture positive for MSSA 9/29 self extubated, required reintubation 10/2 Family identified  10/6 bronchospasm, steroids of this, bronc with clear lungs, ETT  Interim History / Subjective:   No events overnight.  Oxygen stable.  On high-dose Cleviprex.  Blood pressures remain elevated.  Blood cultures mild for staph epi.  Repeating cultures.  Starting cefazolin.  Hopefully just a contaminant.  New fever and leukocytosis justifying starting antibiotics.  Objective   Blood pressure 133/74, pulse 75, temperature 99.1 F (37.3 C), resp. rate (!) 27, height 5\' 7"  (1.702 m), weight 118.3 kg, SpO2 92 %.    Vent Mode: PRVC FiO2 (%):  [60 %] 60 % Set Rate:  [27 bmp] 27 bmp Vt Set:  [550 mL] 550 mL PEEP:  [10 cmH20] 10 cmH20 Plateau Pressure:  [20 cmH20-26 cmH20] 24 cmH20   Intake/Output Summary (Last 24 hours) at 09/10/2022 0856 Last data filed at 09/10/2022 0700 Gross per 24 hour  Intake 2968.55 ml  Output 4920 ml  Net -1951.45 ml    Filed Weights   09/08/22 0300 09/09/22 0400 09/10/22 0500  Weight: 125.8 kg 127.3 kg 118.3 kg    Examination:  General: large body habitus,  intubated, unresponsive to pain/verbal stimuli HEENT:  ET tube in place, moist no  lesions,  Pulmonary: CTAB in bilateral apical lung fields, synchronous with vent, diminished breath sounds in lower lung fields bilaterally Cardiac: regular rate and rhythm, no murmurs/rubs/gallops  appreciated Abdomen: obese, slightly distended,paraumbilical hernia Extremities trace peripheral edema in upper and lower extremities Neuo: Sedated, not responsive to stimuli  Resolved Hospital Problem list     Assessment & Plan:  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 -- Continue metoprolol, nimodipine (in the setting of ruptured aneurysm, hydralazine 100 mg 3 times daily,increase clonidine from 0.1 twice daily to 0.2 mg 3 times daily 10/7 -- Cleviprex, wean as able   Toxic metabolic encephalopathy: With intermittent increases in continuous IV sedation for ventilator synchrony.  Unable to get reliable exam. -- Started oral Klonopin and oxycodone 10/6 -- Wean fentanyl and midazolam gtt.  Fever, Leukocytosis: could be reactive to ICH.  --blood culture 10/6 1/4 staph epi - contaminant suspected, repeat blood cultures --start cefazolin after blood cultures obtained, de-escalate as able --Lower respiratory culture 10/5 with few yeast, no other organisms   Bronchospasm: With increased peak pressures with peak to plateau was elevated.  ET tube clear on FBO 10/6. -- Aggressive inhaled bronchodilators -- Solumedrol 40 mg daily   Acute hypoxemic respiratory failure: In the setting of encephalopathy and mild volume overload as well as MSSA pneumonia status post treatment. -- Continue diuresis -- PRVC, wean PEEP and FiO2 as able -- VAP bundle, stress ulcer prophylaxis   Acute kidney injury: Improving -- Continue diuresis   Hypernatremia: Likely in the setting of salt load with IV fluids as well as diuresis -- Free water added 10/6, increased 10/7   Hyperglycemia: Worse with steroids -- Glargine increased to 20 u daily 10/7, continue q4 TF coverage, and sliding scale  insulin   Best Practice (right click and "Reselect all SmartList Selections" daily)   Diet/type: tubefeeds DVT prophylaxis: systemic heparin GI prophylaxis: PPI Lines: Arterial Line and yes and it is still needed Foley:  Yes, and it is still needed Code Status:  full code  CRITICAL CARE Performed by: Karren Burly   Total critical care time: 40 minutes  Critical care time was exclusive of separately billable procedures and treating other patients.  Critical care was necessary to treat or prevent imminent or life-threatening deterioration.  Critical care was time spent personally by me on the following activities: development of treatment plan with patient and/or surrogate as well as nursing, discussions with consultants, evaluation of patient's response to treatment, examination of patient, obtaining history from patient or surrogate, ordering and performing treatments and interventions, ordering and review of laboratory studies, ordering and review of radiographic studies, pulse oximetry and re-evaluation of patient's condition.   Karren Burly, MD See Loretha Stapler for contact info

## 2022-09-10 NOTE — Progress Notes (Signed)
[facility] Progress Note Patient Name: Dustin Jones DOB: Oct 03, 1961 MRN: [id number]   Date of Service  09/10/2022  HPI/Events of Note  Agitation - Patient has history of self extubation. Nursing request to renew restraint orders.   eICU Interventions  Plan: Will renew soft R wrist restraint orders for 13 hours.      Intervention Category Major Interventions: Delirium, psychosis, severe agitation - evaluation and management  Elise Gladden Eugene 09/10/2022, 7:47 PM

## 2022-09-10 NOTE — Progress Notes (Signed)
[facility] Progress Note Patient Name: Dustin Jones DOB: 21-Dec-1960 MRN: [id number]   Date of Service  09/10/2022  HPI/Events of Note  Fever to 101.3 F --> now temp = 100.9 F post Rx with Tylenol and ice packs. Blood culture #1 growing staph epidermidis which is likely a contaminent. Sputum Gm Stain with Rare WBC and few budding yeast.    eICU Interventions  Plan: Cooling blanket PRN.  Defer antimicrobial management to PCCM day rounding team.     Intervention Category Major Interventions: Other:  Dustin Jones 09/10/2022, 1:23 AM

## 2022-09-10 NOTE — Plan of Care (Signed)
Pt remains on ventilator.  Was able to tolerate weaning sedation off today.  Neuro exam improved minimally off of sedation.  Localizes with RUE; W/D with RLE; No movement LUE/LLE.  Grimaces to voice.  Doesn't open eyes.  Peep weaned from 75 to 13.  FiO2 weaned from 60% to 40%.  Strong cough noted with thick moderate secretions.  Pt on Clevidipine for BP control.  Weaning that as well.  NSR.  Loose stool via rectal tube.  Tolerating TF at goal rate.  268ml FW flush Q4h for hypernatremia.  Diuresing well with 80mg  iv Lasix.  Low grade temps noted and Tylenol given to control.  Started on Cefepime today and BC ordered.

## 2022-09-11 LAB — GLUCOSE, CAPILLARY
Glucose-Capillary: 179 mg/dL — ABNORMAL HIGH (ref 70–99)
Glucose-Capillary: 141 mg/dL — ABNORMAL HIGH (ref 70–99)
Glucose-Capillary: 207 mg/dL — ABNORMAL HIGH (ref 70–99)
Glucose-Capillary: 223 mg/dL — ABNORMAL HIGH (ref 70–99)
Glucose-Capillary: 161 mg/dL — ABNORMAL HIGH (ref 70–99)
Glucose-Capillary: 151 mg/dL — ABNORMAL HIGH (ref 70–99)

## 2022-09-11 LAB — CBC WITH DIFFERENTIAL/PLATELET
Abs Immature Granulocytes: 0.14 10*3/uL — ABNORMAL HIGH (ref 0.00–0.07)
Basophils Absolute: 0.1 10*3/uL (ref 0.0–0.1)
Basophils Relative: 0 %
Eosinophils Absolute: 0.3 10*3/uL (ref 0.0–0.5)
Eosinophils Relative: 1 %
HCT: 39.3 % (ref 39.0–52.0)
Hemoglobin: 11.7 g/dL — ABNORMAL LOW (ref 13.0–17.0)
Immature Granulocytes: 0 %
Lymphocytes Relative: 77 %
Lymphs Abs: 32.7 10*3/uL — ABNORMAL HIGH (ref 0.7–4.0)
MCH: 29.8 pg (ref 26.0–34.0)
MCHC: 29.8 g/dL — ABNORMAL LOW (ref 30.0–36.0)
MCV: 100 fL (ref 80.0–100.0)
Monocytes Absolute: 2.3 10*3/uL — ABNORMAL HIGH (ref 0.1–1.0)
Monocytes Relative: 5 %
Neutro Abs: 7.4 10*3/uL (ref 1.7–7.7)
Neutrophils Relative %: 17 %
Platelets: 167 10*3/uL (ref 150–400)
RBC: 3.93 MIL/uL — ABNORMAL LOW (ref 4.22–5.81)
RDW: 14.3 % (ref 11.5–15.5)
WBC: 42.8 10*3/uL — ABNORMAL HIGH (ref 4.0–10.5)
nRBC: 0 % (ref 0.0–0.2)

## 2022-09-11 LAB — POCT I-STAT 7, (LYTES, BLD GAS, ICA,H+H)
Acid-Base Excess: 8 mmol/L — ABNORMAL HIGH (ref 0.0–2.0)
Bicarbonate: 32.9 mmol/L — ABNORMAL HIGH (ref 20.0–28.0)
Calcium, Ion: 1.17 mmol/L (ref 1.15–1.40)
HCT: 35 % — ABNORMAL LOW (ref 39.0–52.0)
Hemoglobin: 11.9 g/dL — ABNORMAL LOW (ref 13.0–17.0)
O2 Saturation: 96 %
Patient temperature: 36.8
Potassium: 4.4 mmol/L (ref 3.5–5.1)
Sodium: 155 mmol/L — ABNORMAL HIGH (ref 135–145)
TCO2: 34 mmol/L — ABNORMAL HIGH (ref 22–32)
pCO2 arterial: 44.9 mmHg (ref 32–48)
pH, Arterial: 7.472 — ABNORMAL HIGH (ref 7.35–7.45)
pO2, Arterial: 74 mmHg — ABNORMAL LOW (ref 83–108)

## 2022-09-11 LAB — TRIGLYCERIDES: Triglycerides: 271 mg/dL — ABNORMAL HIGH (ref <150)

## 2022-09-11 LAB — RENAL FUNCTION PANEL
Albumin: 2.8 g/dL — ABNORMAL LOW (ref 3.5–5.0)
Anion gap: 8 (ref 5–15)
BUN: 110 mg/dL — ABNORMAL HIGH (ref 8–23)
CO2: 31 mmol/L (ref 22–32)
Calcium: 9.1 mg/dL (ref 8.9–10.3)
Chloride: 115 mmol/L — ABNORMAL HIGH (ref 98–111)
Creatinine, Ser: 1.9 mg/dL — ABNORMAL HIGH (ref 0.61–1.24)
GFR, Estimated: 40 mL/min — ABNORMAL LOW (ref >60)
Glucose, Bld: 191 mg/dL — ABNORMAL HIGH (ref 70–99)
Phosphorus: 4.9 mg/dL — ABNORMAL HIGH (ref 2.5–4.6)
Potassium: 4 mmol/L (ref 3.5–5.1)
Sodium: 154 mmol/L — ABNORMAL HIGH (ref 135–145)

## 2022-09-11 MED ORDER — INSULIN ASPART 100 UNIT/ML IV SOLN
5.0000 [IU] | INTRAVENOUS | Status: DC
  Administered 2022-09-11 – 2022-09-17 (×35): 5 [IU] via SUBCUTANEOUS

## 2022-09-11 MED ORDER — FUROSEMIDE 10 MG/ML IJ SOLN
80.0000 mg | Freq: Once | INTRAMUSCULAR | Status: AC
  Administered 2022-09-11: 80 mg via INTRAVENOUS
  Filled 2022-09-11: qty 8

## 2022-09-11 MED ORDER — FREE WATER
100.0000 mL | Status: DC
  Administered 2022-09-11 – 2022-09-13 (×50): 100 mL

## 2022-09-11 NOTE — Progress Notes (Signed)
NAME:  Dustin Jones, MRN:  [id number], DOB:  [birth year], LOS: 10 ADMISSION DATE:  26-Sep-2022, CONSULTATION DATE:  09-26-2022 REFERRING MD:  Anitra Lauth, MD, CHIEF COMPLAINT:  Intracranial hemorrhage   History of Present Illness:  Patient with a history of hypertension, DVT and chronic tobacco use originally presented to [facility] ED as a code stroke  He was last seen walking around at 3 am. Found down  at 8 am by bystanders who called 911. EMS reported the pt had L-sided deficits and AMS. Systolic BP reported to be in the 190's per EMS. CT imaging in ED revealed acute intraparenchymal hemorrhage with possible lobulated 62mm outpouching arising from the supraclinoid right ICA that is suspicious for ruptured aneurysm. Was admitted by Neurosurgery for close monitoring and further diagnostic testing. CCM was consulted for ventilator management.   Pertinent  Medical History  Hypertension History of DVT Hx Etoh use, not quantified HX of Tobacco use, > 25 pack years  Significant Hospital Events: Including procedures, antibiotic start and stop dates in addition to other pertinent events   9/28 admitted. Intracranial Hemorrhage/ R MCA ruptured aneurysm Non-con CT of head demonstrating large acute hemorrhage in the R lentiform nucleus with with intraventricular extension, midline shift, and slight rounding of the temporal horns, possibly representing early hydrocephalus. On CTA, pt found to have a R MCA ruptured aneurysm. Neurosurgery consulted and following with plans for possible arterial clipping this evening. CT of the face and cervical spine did not show any acute traumatic injuries, though BL cervical lymphadenopathy was noted. Echo with normal biventricular function without evidence of hemodynamically sig valvular heart disease.  9/28 complete echo with LVEF of 60-65%, no regional wall motion abnormalities but with grade 1 diastolic dysfunction.  Normal right ventricular systolic function.  No valvular  abnormalities. 9/29 MRI brain 1. Large acute intraparenchymal hemorrhage centered in the rightlentiform nucleus, with extension into the right-greater-than-left lateral ventricle, third ventricle, and fourth ventricle, overall similar to the prior CT. Mild mass effect and 5 mm of right-to-left midline shift. No hydrocephalus. 2. Small areas of acute infarct in the bilateral occipital lobes, bilateral frontal lobes, and right parietal lobe. Spiking fever. Has sig leukocytosis. Cultures sent. Unasyn started for aspiration  9/29 respiratory culture positive for MSSA 9/29 self extubated, required reintubation 10/2 Family identified  10/6 bronchospasm, steroids of this, bronc with clear lungs, ETT  Interim History / Subjective:   No events overnight.  Weaned O2.  Switched off of midazolam to propofol yesterday afternoon.  Blood pressure better controlled.  Off Cleviprex.  Still poor neuro exam.  Objective   Blood pressure 117/75, pulse 67, temperature (!) 100.6 F (38.1 C), resp. rate (!) 27, height 5\' 7"  (1.702 m), weight 124.9 kg, SpO2 95 %.    Vent Mode: PRVC FiO2 (%):  [40 %] 40 % Set Rate:  [27 bmp] 27 bmp Vt Set:  [550 mL] 550 mL PEEP:  [8 cmH20] 8 cmH20 Plateau Pressure:  [18 cmH20-23 cmH20] 20 cmH20   Intake/Output Summary (Last 24 hours) at 09/11/2022 1309 Last data filed at 09/11/2022 1300 Gross per 24 hour  Intake 3027.73 ml  Output 3500 ml  Net -472.27 ml    Filed Weights   09/09/22 0400 09/10/22 0500 09/11/22 0500  Weight: 127.3 kg 118.3 kg 124.9 kg    Examination:  General: large body habitus,  intubated, unresponsive to pain/verbal stimuli HEENT:  ET tube in place, moist no lesions,  Pulmonary: CTAB in bilateral apical lung fields, synchronous with vent,  diminished breath sounds in lower lung fields bilaterally Cardiac: regular rate and rhythm, no murmurs/rubs/gallops appreciated Abdomen: obese, slightly distended,paraumbilical hernia Extremities trace peripheral  edema in upper and lower extremities Neuo: Sedated, not responsive to stimuli  Resolved Hospital Problem list     Assessment & Plan:  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 -- Continue metoprolol, nimodipine (in the setting of ruptured aneurysm, hydralazine 100 mg 3 times daily,clonidine 0.2 mg 3 times daily  -- Cleviprex weaned off again   Toxic metabolic encephalopathy: With intermittent increases in continuous IV sedation for ventilator synchrony.  Unable to get reliable exam. -- Started oral Klonopin and oxycodone 10/6 -- Wean fentanyl, prop as able  Fever, Leukocytosis: could be reactive to Ina.  --blood culture 10/6 1/4 staph epi - contaminant suspected, repeat blood cultures --start cefazolin after blood cultures obtained, de-escalate as able if repeat cultures negative at 48 hrs --Lower respiratory culture 10/5 with few yeast, no other organisms   Bronchospasm: With increased peak pressures with peak to plateau was elevated.  ET tube clear on FBO 10/6. -- Aggressive inhaled bronchodilators -- Solumedrol 40 mg daily   Acute hypoxemic respiratory failure: In the setting of encephalopathy and mild volume overload as well as MSSA pneumonia status post treatment. -- Continue diuresis -- PRVC, wean PEEP and FiO2 as able -- VAP bundle, stress ulcer prophylaxis   Acute kidney injury: Improving -- Continue diuresis   Hypernatremia: Likely in the setting of salt load with IV fluids as well as diuresis -- Free water added 10/6, increased 10/7   Hyperglycemia: Worse with steroids, improving with insulin changes -- Glargine increased to 20 u daily 10/7, increase q4 TF coverage 10/8, and sliding scale insulin   Best Practice (right  click and "Reselect all SmartList Selections" daily)   Diet/type: tubefeeds DVT prophylaxis: systemic heparin GI prophylaxis: PPI Lines: Arterial Line and yes and it is still needed Foley:  Yes, and it is still needed Code Status:  full code  CRITICAL CARE Performed by: Lanier Clam   Total critical care time: 35 minutes  Critical care time was exclusive of separately billable procedures and treating other patients.  Critical care was necessary to treat or prevent imminent or life-threatening deterioration.  Critical care was time spent personally by me on the following activities: development of treatment plan with patient and/or surrogate as well as nursing, discussions with consultants, evaluation of patient's response to treatment, examination of patient, obtaining history from patient or surrogate, ordering and performing treatments and interventions, ordering and review of laboratory studies, ordering and review of radiographic studies, pulse oximetry and re-evaluation of patient's condition.   Lanier Clam, MD See Shea Evans for contact info

## 2022-09-12 ENCOUNTER — Inpatient Hospital Stay (HOSPITAL_COMMUNITY): Payer: Medicaid Other

## 2022-09-12 LAB — POCT I-STAT 7, (LYTES, BLD GAS, ICA,H+H)
Acid-Base Excess: 5 mmol/L — ABNORMAL HIGH (ref 0.0–2.0)
Bicarbonate: 31.5 mmol/L — ABNORMAL HIGH (ref 20.0–28.0)
Calcium, Ion: 1.24 mmol/L (ref 1.15–1.40)
HCT: 35 % — ABNORMAL LOW (ref 39.0–52.0)
Hemoglobin: 11.9 g/dL — ABNORMAL LOW (ref 13.0–17.0)
O2 Saturation: 97 %
Patient temperature: 98.2
Potassium: 4.2 mmol/L (ref 3.5–5.1)
Sodium: 155 mmol/L — ABNORMAL HIGH (ref 135–145)
TCO2: 33 mmol/L — ABNORMAL HIGH (ref 22–32)
pCO2 arterial: 51.5 mmHg — ABNORMAL HIGH (ref 32–48)
pH, Arterial: 7.393 (ref 7.35–7.45)
pO2, Arterial: 93 mmHg (ref 83–108)

## 2022-09-12 LAB — CBC WITH DIFFERENTIAL/PLATELET
Abs Immature Granulocytes: 0.11 10*3/uL — ABNORMAL HIGH (ref 0.00–0.07)
Basophils Absolute: 0.1 10*3/uL (ref 0.0–0.1)
Basophils Relative: 0 %
Eosinophils Absolute: 0.3 10*3/uL (ref 0.0–0.5)
Eosinophils Relative: 1 %
HCT: 39.4 % (ref 39.0–52.0)
Hemoglobin: 11.7 g/dL — ABNORMAL LOW (ref 13.0–17.0)
Immature Granulocytes: 0 %
Lymphocytes Relative: 78 %
Lymphs Abs: 32.2 10*3/uL — ABNORMAL HIGH (ref 0.7–4.0)
MCH: 30.1 pg (ref 26.0–34.0)
MCHC: 29.7 g/dL — ABNORMAL LOW (ref 30.0–36.0)
MCV: 101.3 fL — ABNORMAL HIGH (ref 80.0–100.0)
Monocytes Absolute: 0.7 10*3/uL (ref 0.1–1.0)
Monocytes Relative: 2 %
Neutro Abs: 7.7 10*3/uL (ref 1.7–7.7)
Neutrophils Relative %: 19 %
Platelets: 171 10*3/uL (ref 150–400)
RBC: 3.89 MIL/uL — ABNORMAL LOW (ref 4.22–5.81)
RDW: 14.2 % (ref 11.5–15.5)
WBC: 41.1 10*3/uL — ABNORMAL HIGH (ref 4.0–10.5)
nRBC: 0 % (ref 0.0–0.2)

## 2022-09-12 LAB — GLUCOSE, CAPILLARY
Glucose-Capillary: 154 mg/dL — ABNORMAL HIGH (ref 70–99)
Glucose-Capillary: 128 mg/dL — ABNORMAL HIGH (ref 70–99)
Glucose-Capillary: 166 mg/dL — ABNORMAL HIGH (ref 70–99)
Glucose-Capillary: 220 mg/dL — ABNORMAL HIGH (ref 70–99)
Glucose-Capillary: 212 mg/dL — ABNORMAL HIGH (ref 70–99)
Glucose-Capillary: 203 mg/dL — ABNORMAL HIGH (ref 70–99)

## 2022-09-12 LAB — RENAL FUNCTION PANEL
Albumin: 2.8 g/dL — ABNORMAL LOW (ref 3.5–5.0)
Anion gap: 6 (ref 5–15)
BUN: 94 mg/dL — ABNORMAL HIGH (ref 8–23)
CO2: 29 mmol/L (ref 22–32)
Calcium: 9 mg/dL (ref 8.9–10.3)
Chloride: 117 mmol/L — ABNORMAL HIGH (ref 98–111)
Creatinine, Ser: 1.67 mg/dL — ABNORMAL HIGH (ref 0.61–1.24)
GFR, Estimated: 46 mL/min — ABNORMAL LOW (ref >60)
Glucose, Bld: 166 mg/dL — ABNORMAL HIGH (ref 70–99)
Phosphorus: 5.4 mg/dL — ABNORMAL HIGH (ref 2.5–4.6)
Potassium: 4.1 mmol/L (ref 3.5–5.1)
Sodium: 152 mmol/L — ABNORMAL HIGH (ref 135–145)

## 2022-09-12 LAB — CULTURE, RESPIRATORY W GRAM STAIN: Special Requests: NORMAL

## 2022-09-12 MED ORDER — DEXMEDETOMIDINE HCL IN NACL 400 MCG/100ML IV SOLN
0.4000 ug/kg/h | INTRAVENOUS | Status: DC
  Administered 2022-09-12: 0.4 ug/kg/h via INTRAVENOUS
  Administered 2022-09-12: 1.1 ug/kg/h via INTRAVENOUS
  Administered 2022-09-12: 1 ug/kg/h via INTRAVENOUS
  Administered 2022-09-12: 1.1 ug/kg/h via INTRAVENOUS
  Administered 2022-09-12: 0.9 ug/kg/h via INTRAVENOUS
  Administered 2022-09-13: 1.2 ug/kg/h via INTRAVENOUS
  Administered 2022-09-13 (×2): 1 ug/kg/h via INTRAVENOUS
  Administered 2022-09-13 (×2): 1.2 ug/kg/h via INTRAVENOUS
  Administered 2022-09-13: 1.1 ug/kg/h via INTRAVENOUS
  Administered 2022-09-13 (×2): 1.2 ug/kg/h via INTRAVENOUS
  Administered 2022-09-14: 0.8 ug/kg/h via INTRAVENOUS
  Administered 2022-09-14: 1 ug/kg/h via INTRAVENOUS
  Administered 2022-09-14: 0.8 ug/kg/h via INTRAVENOUS
  Administered 2022-09-14: 0.2 ug/kg/h via INTRAVENOUS
  Administered 2022-09-15: 1.2 ug/kg/h via INTRAVENOUS
  Administered 2022-09-15: 1 ug/kg/h via INTRAVENOUS
  Filled 2022-09-12 (×21): qty 100

## 2022-09-12 MED ORDER — FENTANYL CITRATE PF 50 MCG/ML IJ SOSY
50.0000 ug | PREFILLED_SYRINGE | INTRAMUSCULAR | Status: DC | PRN
  Administered 2022-09-12: 100 ug via INTRAVENOUS
  Administered 2022-09-13 (×3): 200 ug via INTRAVENOUS
  Administered 2022-09-13: 100 ug via INTRAVENOUS
  Filled 2022-09-12: qty 1
  Filled 2022-09-12: qty 4
  Filled 2022-09-12: qty 3
  Filled 2022-09-12: qty 4
  Filled 2022-09-12: qty 2
  Filled 2022-09-12: qty 4
  Filled 2022-09-12: qty 2

## 2022-09-12 MED ORDER — CLONIDINE HCL 0.1 MG PO TABS
0.1000 mg | ORAL_TABLET | Freq: Once | ORAL | Status: AC
  Administered 2022-09-12: 0.1 mg
  Filled 2022-09-12: qty 1

## 2022-09-12 MED ORDER — IPRATROPIUM-ALBUTEROL 0.5-2.5 (3) MG/3ML IN SOLN
3.0000 mL | Freq: Four times a day (QID) | RESPIRATORY_TRACT | Status: DC
  Administered 2022-09-13 – 2022-09-15 (×10): 3 mL via RESPIRATORY_TRACT
  Filled 2022-09-12 (×10): qty 3

## 2022-09-12 MED ORDER — FENTANYL CITRATE PF 50 MCG/ML IJ SOSY
50.0000 ug | PREFILLED_SYRINGE | INTRAMUSCULAR | Status: DC | PRN
  Administered 2022-09-14: 50 ug via INTRAVENOUS
  Filled 2022-09-12: qty 1

## 2022-09-12 MED ORDER — CLONIDINE HCL 0.1 MG PO TABS
0.3000 mg | ORAL_TABLET | Freq: Three times a day (TID) | ORAL | Status: DC
  Administered 2022-09-12 – 2022-09-27 (×43): 0.3 mg
  Filled 2022-09-12 (×3): qty 2
  Filled 2022-09-12 (×2): qty 1
  Filled 2022-09-12: qty 2
  Filled 2022-09-12 (×2): qty 3
  Filled 2022-09-12: qty 1
  Filled 2022-09-12 (×3): qty 2
  Filled 2022-09-12: qty 3
  Filled 2022-09-12 (×2): qty 2
  Filled 2022-09-12: qty 1
  Filled 2022-09-12 (×2): qty 2
  Filled 2022-09-12 (×2): qty 1
  Filled 2022-09-12 (×3): qty 3
  Filled 2022-09-12: qty 1
  Filled 2022-09-12 (×2): qty 3
  Filled 2022-09-12: qty 2
  Filled 2022-09-12 (×2): qty 1
  Filled 2022-09-12: qty 2
  Filled 2022-09-12: qty 1
  Filled 2022-09-12: qty 2
  Filled 2022-09-12: qty 1
  Filled 2022-09-12 (×3): qty 2
  Filled 2022-09-12: qty 1
  Filled 2022-09-12: qty 2
  Filled 2022-09-12 (×2): qty 1
  Filled 2022-09-12 (×2): qty 3
  Filled 2022-09-12: qty 2

## 2022-09-12 NOTE — Progress Notes (Signed)
NAME:  Dustin Jones, MRN:  [id number], DOB:  August 01, 1961, LOS: 64 ADMISSION DATE:  08/10/2022, CONSULTATION DATE:  08/05/2022 REFERRING MD:  Maryan Rued - EDP CHIEF COMPLAINT:  Intracranial hemorrhage   History of Present Illness:  61 year old man who presented to [facility] 9/28 as a Code Stroke. PMHx significant for HTN, DVT and chronic tobacco use.  LKW ~0300 9/28. Found down ~0800 by bystanders who called 911. EMS reported the patient had L-sided deficits and AMS. Systolic BP reported to be in the 190s per EMS. CT Head demonstrated acute intraparenchymal hemorrhage with possible lobulated 17mm outpouching arising from the supraclinoid right ICA, suspicious for ruptured aneurysm. Admitted by Neurosurgery for close monitoring and further diagnostic testing.   PCCM was consulted for ventilator management.   Pertinent Medical History:  Hypertension History of DVT Hx Etoh use, not quantified HX of Tobacco use, > 25 pack years  Significant Hospital Events: Including procedures, antibiotic start and stop dates in addition to other pertinent events   9/28 admitted. Intracranial Hemorrhage/ R MCA ruptured aneurysm Non-con CT of head demonstrating large acute hemorrhage in the R lentiform nucleus with with intraventricular extension, midline shift, and slight rounding of the temporal horns, possibly representing early hydrocephalus. On CTA, pt found to have a R MCA ruptured aneurysm. Neurosurgery consulted and following with plans for possible arterial clipping this evening. CT of the face and cervical spine did not show any acute traumatic injuries, though BL cervical lymphadenopathy was noted. Echo with normal biventricular function without evidence of hemodynamically sig valvular heart disease.  9/28 complete echo with LVEF of 60-65%, no regional wall motion abnormalities but with grade 1 diastolic dysfunction.  Normal right ventricular systolic function.  No valvular abnormalities. 9/29 MRI brain 1. Large  acute intraparenchymal hemorrhage centered in the rightlentiform nucleus, with extension into the right-greater-than-left lateral ventricle, third ventricle, and fourth ventricle, overall similar to the prior CT. Mild mass effect and 5 mm of right-to-left midline shift. No hydrocephalus. 2. Small areas of acute infarct in the bilateral occipital lobes, bilateral frontal lobes, and right parietal lobe. Spiking fever. Has sig leukocytosis. Cultures sent. Unasyn started for aspiration  9/29 respiratory culture positive for MSSA 9/29 self extubated, required reintubation 10/2 Family identified  10/6 bronchospasm, steroids of this, bronc with clear lungs, ETT 10/9 Neuro exam/mental status remains poor. Coming down on vent requirements. BP improved, remains off of Cleviprex. D/w family re: tracheostomy.  Interim History / Subjective:  No significant events overnight Neuro status remains poor, extubation continues to be precluded by mental status Sedation adjusted to Precedex, Fentanyl PRN today; Prop/Fent gtt discontinued Intubated x 10 days, will consider trach/discuss with family Possible trach tomorrow, 10/10 if family wishes to pursue this  Objective:  Blood pressure 120/77, pulse 72, temperature 99.1 F (37.3 C), resp. rate (!) 27, height 5\' 7"  (1.702 m), weight 128.5 kg, SpO2 100 %.    Vent Mode: PRVC FiO2 (%):  [40 %] 40 % Set Rate:  [27 bmp] 27 bmp Vt Set:  [550 mL] 550 mL PEEP:  [8 cmH20] 8 cmH20 Plateau Pressure:  [20 cmH20-21 cmH20] 20 cmH20   Intake/Output Summary (Last 24 hours) at 09/12/2022 0741 Last data filed at 09/12/2022 0600 Gross per 24 hour  Intake 3683.75 ml  Output 3800 ml  Net -116.25 ml    Filed Weights   09/10/22 0500 09/11/22 0500 09/12/22 0316  Weight: 118.3 kg 124.9 kg 128.5 kg   Physical Examination: General: Acute-on-chronically ill-appearing middle-aged man in NAD. HEENT:  [place]/AT, anicteric sclera, PERRL 48mm, moist mucous membranes. ETT/OGT in  place. Neuro: Sedated. Responds to noxious stimuli. Withdraws to pain in RUE/RLE; winces to pain in LUE but does not move. Not following commands. Unilateral L-sided neglect noted.+Corneal, +Cough, and +Gag  CV: RRR, no m/g/r. PULM: Breathing even and unlabored on vent (PEEP 5, FiO2 40%). Breathing over the vent. Lung fields coarse throughout. +Thin yellow-white secretions with scant blood in in-line suction. GI: Soft, nontender, nondistended. Normoactive bowel sounds. Flexiseal in place. Extremities: No LE edema noted. Skin: Warm/dry, no rashes.  Resolved Hospital Problem List:    Assessment & Plan:  Intracranial hemorrhage/right ICA ruptured aneurysm status post coiling: - Last seen by NSGY 10/1 - Goal SBP < 160 - Hold all AC/antiplatelet agents - Continue nimotop - Ongoing sedation adjustment to allow for accurate neurologic exam - Adjust sedation to Precedex/Fentanyl PRN, d/c Propofol and Fentanyl gtt - Neuroprotective measures: HOB > 30 degrees, normoglycemia, normothermia, electrolytes WNL   Hypertension: Longstanding, multidrug at home. - Goal SBP < 160 - Remains off of Cleviprex at present - Continue metoprolol, hydralazine, clonidine - Nimotop for vasospasm prevention, as above  Toxic metabolic encephalopathy: With intermittent increases in continuous IV sedation for ventilator synchrony.  Unable to get reliable exam. - Continue VT Klonopin, oxycodone as above - Wean sedation as able to allow for accurate neuro exam/assess for extubation readiness  Acute hypoxemic respiratory failure: In the setting of encephalopathy and mild volume overload as well as MSSA pneumonia status post treatment. Bronchospasm: With increased peak pressures with peak to plateau was elevated.  - Continue full vent support (4-8cc/kg IBW) - Wean FiO2 for O2 sat > 90% - Daily WUA/SBT - VAP bundle - Bronchodilators (Brovana/Yupelri, scheduled DuoNebs, albuterol PRN) - Continue Solumedrol 40mg  daily,  taper as clinically appropriate - Pulmonary hygiene - PAD protocol for sedation: Precedex and Fentanyl for goal RASS 0 to -1 - Follow CXR - Suspect patient will require tracheostomy for definitive vent weaning in the setting of persistent poor mental status; tentative 10/10 pending family discussion  Fever, Leukocytosis, likely multifactorial; reactive with ICH versus PNA Bcx 10/6 1/4 staph epi - contaminant suspected, repeat blood cultures. CXR with persistent infiltrates. Resp Cx 10/5 with few yeast, no other organisms - Continue cefazolin, narrow as able - Low threshold for repeat Resp Cx   Acute kidney injury: Improving - Trend BMP - Replete electrolytes as indicated - Monitor I&Os - Avoid nephrotoxic agents as able - Ensure adequate renal perfusion   Hypernatremia: Likely in the setting of salt load with IV fluids as well as diuresis - OK for permissive hypernatremia (goal ~150) in the setting of ICH - FWF to maintain goal   Hyperglycemia: Worse with steroids, improving with insulin changes - Basal Semglee 20U daily - TF coverage 5U Q4H - SSI - CBGs Q4H - Goal CBG 140-180  Best Practice: (right click and "Reselect all SmartList Selections" daily)   Diet/type: tubefeeds DVT prophylaxis: systemic heparin GI prophylaxis: PPI Lines: Arterial Line and yes and it is still needed Foley:  Yes, and it is still needed Code Status:  full code  Best Practice: (right click and "Reselect all SmartList Selections" daily)   The patient is critically ill with multiple organ system failure and requires high complexity decision making for assessment and support, frequent evaluation and titration of therapies, advanced monitoring, review of radiographic studies and interpretation of complex data.   Critical Care Time devoted to patient care services, exclusive of separately billable procedures,  described in this note is 38 minutes.  Lestine Mount, PA-C [facility] 09/12/22 8:07 AM  Please see [email or web address] for pager details.  From 7A-7P if no response, please call [phone] After hours, please call ELink [phone]

## 2022-09-13 ENCOUNTER — Inpatient Hospital Stay (HOSPITAL_COMMUNITY): Payer: Medicaid Other

## 2022-09-13 DIAGNOSIS — I629 Nontraumatic intracranial hemorrhage, unspecified: Secondary | ICD-10-CM

## 2022-09-13 DIAGNOSIS — J15211 Pneumonia due to Methicillin susceptible Staphylococcus aureus: Secondary | ICD-10-CM

## 2022-09-13 LAB — CBC WITH DIFFERENTIAL/PLATELET
Abs Immature Granulocytes: 0.16 10*3/uL — ABNORMAL HIGH (ref 0.00–0.07)
Basophils Absolute: 0.1 10*3/uL (ref 0.0–0.1)
Basophils Relative: 0 %
Eosinophils Absolute: 0.3 10*3/uL (ref 0.0–0.5)
Eosinophils Relative: 1 %
HCT: 42.2 % (ref 39.0–52.0)
Hemoglobin: 12.7 g/dL — ABNORMAL LOW (ref 13.0–17.0)
Immature Granulocytes: 0 %
Lymphocytes Relative: 75 %
Lymphs Abs: 30.8 10*3/uL — ABNORMAL HIGH (ref 0.7–4.0)
MCH: 30.1 pg (ref 26.0–34.0)
MCHC: 30.1 g/dL (ref 30.0–36.0)
MCV: 100 fL (ref 80.0–100.0)
Monocytes Absolute: 0.7 10*3/uL (ref 0.1–1.0)
Monocytes Relative: 2 %
Neutro Abs: 9.1 10*3/uL — ABNORMAL HIGH (ref 1.7–7.7)
Neutrophils Relative %: 22 %
Platelets: 196 10*3/uL (ref 150–400)
RBC: 4.22 MIL/uL (ref 4.22–5.81)
RDW: 13.5 % (ref 11.5–15.5)
WBC: 41.1 10*3/uL — ABNORMAL HIGH (ref 4.0–10.5)
nRBC: 0 % (ref 0.0–0.2)

## 2022-09-13 LAB — POCT I-STAT 7, (LYTES, BLD GAS, ICA,H+H)
Acid-Base Excess: 1 mmol/L (ref 0.0–2.0)
Bicarbonate: 25.9 mmol/L (ref 20.0–28.0)
Calcium, Ion: 1.27 mmol/L (ref 1.15–1.40)
HCT: 38 % — ABNORMAL LOW (ref 39.0–52.0)
Hemoglobin: 12.9 g/dL — ABNORMAL LOW (ref 13.0–17.0)
O2 Saturation: 97 %
Patient temperature: 98.2
Potassium: 4 mmol/L (ref 3.5–5.1)
Sodium: 153 mmol/L — ABNORMAL HIGH (ref 135–145)
TCO2: 27 mmol/L (ref 22–32)
pCO2 arterial: 41.7 mmHg (ref 32–48)
pH, Arterial: 7.401 (ref 7.35–7.45)
pO2, Arterial: 87 mmHg (ref 83–108)

## 2022-09-13 LAB — BASIC METABOLIC PANEL
Anion gap: 8 (ref 5–15)
BUN: 56 mg/dL — ABNORMAL HIGH (ref 8–23)
CO2: 28 mmol/L (ref 22–32)
Calcium: 9.1 mg/dL (ref 8.9–10.3)
Chloride: 114 mmol/L — ABNORMAL HIGH (ref 98–111)
Creatinine, Ser: 1.25 mg/dL — ABNORMAL HIGH (ref 0.61–1.24)
GFR, Estimated: >60 mL/min (ref >60)
Glucose, Bld: 178 mg/dL — ABNORMAL HIGH (ref 70–99)
Potassium: 4.2 mmol/L (ref 3.5–5.1)
Sodium: 150 mmol/L — ABNORMAL HIGH (ref 135–145)

## 2022-09-13 LAB — GLUCOSE, CAPILLARY
Glucose-Capillary: 223 mg/dL — ABNORMAL HIGH (ref 70–99)
Glucose-Capillary: 196 mg/dL — ABNORMAL HIGH (ref 70–99)
Glucose-Capillary: 183 mg/dL — ABNORMAL HIGH (ref 70–99)
Glucose-Capillary: 197 mg/dL — ABNORMAL HIGH (ref 70–99)
Glucose-Capillary: 191 mg/dL — ABNORMAL HIGH (ref 70–99)

## 2022-09-13 MED ORDER — POLYETHYLENE GLYCOL 3350 17 G PO PACK: 17.0000 g | PACK | Freq: Every day | ORAL | Status: DC

## 2022-09-13 MED ORDER — FUROSEMIDE 10 MG/ML IJ SOLN
60.0000 mg | Freq: Once | INTRAMUSCULAR | Status: AC
  Administered 2022-09-13: 60 mg via INTRAVENOUS
  Filled 2022-09-13: qty 6

## 2022-09-13 MED ORDER — POLYETHYLENE GLYCOL 3350 17 G PO PACK
17.0000 g | PACK | Freq: Every day | ORAL | Status: DC
  Filled 2022-09-13: qty 1

## 2022-09-13 MED ORDER — DOCUSATE SODIUM 50 MG/5ML PO LIQD: 100.0000 mg | Freq: Two times a day (BID) | ORAL | Status: DC

## 2022-09-13 MED ORDER — SODIUM CHLORIDE 0.9 % IV SOLN: INTRAVENOUS | Status: DC | PRN

## 2022-09-13 MED ORDER — MIDAZOLAM HCL 2 MG/2ML IJ SOLN: 2.0000 mg | INTRAMUSCULAR | Status: DC | PRN

## 2022-09-13 MED ORDER — MIDAZOLAM HCL 2 MG/2ML IJ SOLN
3.0000 mg | Freq: Once | INTRAMUSCULAR | Status: AC
  Administered 2022-09-13: 3 mg via INTRAVENOUS
  Filled 2022-09-13: qty 4

## 2022-09-13 MED ORDER — INSULIN GLARGINE-YFGN 100 UNIT/ML ~~LOC~~ SOLN
10.0000 [IU] | Freq: Every day | SUBCUTANEOUS | Status: DC
  Administered 2022-09-14 – 2022-09-15 (×2): 10 [IU] via SUBCUTANEOUS
  Filled 2022-09-13 (×3): qty 0.1

## 2022-09-13 MED ORDER — ROCURONIUM BROMIDE 10 MG/ML (PF) SYRINGE
100.0000 mg | PREFILLED_SYRINGE | Freq: Once | INTRAVENOUS | Status: AC
  Administered 2022-09-13: 100 mg via INTRAVENOUS
  Filled 2022-09-13 (×3): qty 10

## 2022-09-13 MED ORDER — INSULIN GLARGINE-YFGN 100 UNIT/ML ~~LOC~~ SOLN
30.0000 [IU] | Freq: Every day | SUBCUTANEOUS | Status: DC
  Filled 2022-09-13: qty 0.3

## 2022-09-13 MED ORDER — DOCUSATE SODIUM 50 MG/5ML PO LIQD
100.0000 mg | Freq: Two times a day (BID) | ORAL | Status: DC
  Administered 2022-09-17 – 2022-09-19 (×3): 100 mg
  Filled 2022-09-13 (×5): qty 10

## 2022-09-13 MED ORDER — INSULIN GLARGINE-YFGN 100 UNIT/ML ~~LOC~~ SOLN: 15.0000 [IU] | Freq: Every day | SUBCUTANEOUS | Status: DC

## 2022-09-13 MED ORDER — ETOMIDATE 2 MG/ML IV SOLN
20.0000 mg | Freq: Once | INTRAVENOUS | Status: AC
  Administered 2022-09-13: 20 mg via INTRAVENOUS
  Filled 2022-09-13: qty 10

## 2022-09-13 MED ORDER — MIDAZOLAM HCL 2 MG/2ML IJ SOLN
2.0000 mg | INTRAMUSCULAR | Status: DC | PRN
  Administered 2022-09-13: 2 mg via INTRAVENOUS
  Filled 2022-09-13: qty 2

## 2022-09-13 MED ORDER — MIDAZOLAM HCL 2 MG/2ML IJ SOLN
5.0000 mg | Freq: Once | INTRAMUSCULAR | Status: AC
  Administered 2022-09-13: 5 mg via INTRAVENOUS
  Filled 2022-09-13: qty 6

## 2022-09-13 MED ORDER — FENTANYL CITRATE PF 50 MCG/ML IJ SOSY
200.0000 ug | PREFILLED_SYRINGE | Freq: Once | INTRAMUSCULAR | Status: AC
  Administered 2022-09-13: 200 ug via INTRAVENOUS
  Filled 2022-09-13: qty 4

## 2022-09-13 NOTE — Procedures (Signed)
Percutaneous Tracheostomy Procedure Note   Dustin Jones  [id number]  1961/05/09  Date:09/13/22  Time:2:01 PM   Provider Performing:Jonnell Hentges  Procedure: Percutaneous Tracheostomy with Bronchoscopic Guidance (47096)  Indication(s) Acute respiratory failure with hypoxia  Consent Risks of the procedure as well as the alternatives and risks of each were explained to the patient and/or caregiver.  Consent for the procedure was obtained.  Anesthesia Etomidate, Versed, Fentanyl, Vecuronium   Time Out Verified patient identification, verified procedure, site/side was marked, verified correct patient position, special equipment/implants available, medications/allergies/relevant history reviewed, required imaging and test results available.   Sterile Technique Maximal sterile technique including sterile barrier drape, hand hygiene, sterile gown, sterile gloves, mask, hair covering.    Procedure Description Appropriate anatomy identified by palpation.  Patient's neck prepped and draped in sterile fashion.  1% lidocaine with epinephrine was used to anesthetize skin overlying neck.  1.5cm incision made and blunt dissection performed until tracheal rings could be easily palpated.   Then a size 8 Shiley tracheostomy was placed under bronchoscopic visualization using usual Seldinger technique and serial dilation.   Bronchoscope confirmed placement above the carina.  Tracheostomy was sutured in place with adhesive pad to protect skin under pressure.    Patient connected to ventilator.   Complications/Tolerance None; patient tolerated the procedure well. Chest X-ray is ordered to confirm no post-procedural complication.   EBL Minimal   Specimen(s) None

## 2022-09-13 NOTE — Progress Notes (Addendum)
eLink Physician-Brief Progress Note Patient Name: Dustin Jones DOB: [birth year] MRN: [id number]   Date of Service  09/13/2022  HPI/Events of Note  Patient with ventilator dyssynchrony.  eICU Interventions  Versed 3 mg iv x 1 ordered        Tyrell Brereton U Kinneth Fujiwara 09/13/2022, 5:42 AM

## 2022-09-13 NOTE — Progress Notes (Signed)
NAME:  ROLANDO HESSLING, MRN:  [id number], DOB:  07/19/1961, LOS: 12 ADMISSION DATE:  2022/09/27, CONSULTATION DATE:  2022/09/27 REFERRING MD:  Anitra Lauth - EDP CHIEF COMPLAINT:  Intracranial hemorrhage   History of Present Illness:  61 year old man who presented to [facility] 9/28 as a Code Stroke. PMHx significant for HTN, DVT and chronic tobacco use.  LKW ~0300 9/28. Found down ~0800 by bystanders who called 911. EMS reported the patient had L-sided deficits and AMS. Systolic BP reported to be in the 190s per EMS. CT Head demonstrated acute intraparenchymal hemorrhage with possible lobulated 59mm outpouching arising from the supraclinoid right ICA, suspicious for ruptured aneurysm. Admitted by Neurosurgery for close monitoring and further diagnostic testing.   PCCM was consulted for ventilator management.   Pertinent Medical History:  Hypertension History of DVT Hx Etoh use, not quantified HX of Tobacco use, > 25 pack years  Significant Hospital Events: Including procedures, antibiotic start and stop dates in addition to other pertinent events   9/28 admitted. Intracranial Hemorrhage/ R MCA ruptured aneurysm Non-con CT of head demonstrating large acute hemorrhage in the R lentiform nucleus with with intraventricular extension, midline shift, and slight rounding of the temporal horns, possibly representing early hydrocephalus. On CTA, pt found to have a R MCA ruptured aneurysm. Neurosurgery consulted and following with plans for possible arterial clipping this evening. CT of the face and cervical spine did not show any acute traumatic injuries, though BL cervical lymphadenopathy was noted. Echo with normal biventricular function without evidence of hemodynamically sig valvular heart disease.  9/28 complete echo with LVEF of 60-65%, no regional wall motion abnormalities but with grade 1 diastolic dysfunction.  Normal right ventricular systolic function.  No valvular abnormalities. 9/29 MRI brain 1. Large  acute intraparenchymal hemorrhage centered in the rightlentiform nucleus, with extension into the right-greater-than-left lateral ventricle, third ventricle, and fourth ventricle, overall similar to the prior CT. Mild mass effect and 5 mm of right-to-left midline shift. No hydrocephalus. 2. Small areas of acute infarct in the bilateral occipital lobes, bilateral frontal lobes, and right parietal lobe. Spiking fever. Has sig leukocytosis. Cultures sent. Unasyn started for aspiration  9/29 respiratory culture positive for MSSA 9/29 self extubated, required reintubation 10/2 Family identified  10/6 bronchospasm, steroids of this, bronc with clear lungs, ETT 10/9 Neuro exam/mental status remains poor. Coming down on vent requirements. BP improved, remains off of Cleviprex. D/w family re: tracheostomy. 10/10 Remains hypertensive, agitation. Sedation adjusted (Precedex, Fentanyl/Versed PRN). Increased peak pressures on vent. CXR worse with asymmetric pulmonary edema. Lasix given. Trach with CCM.  Interim History / Subjective:  No significant events overnight More uncomfortable on vent today, agitated and coughing and with elevated peak pressures CXR with asymmetric pulmonary edema, R > L Lasix 60mg  IV administered Sedation adjusted to Precedex and Fent/Versed PRN Neuro status slightly improved but extubation continues to be precluded by mental status Trach today with CCM  Objective:  Blood pressure 135/81, pulse 69, temperature 98.6 F (37 C), temperature source Bladder, resp. rate 17, height 5\' 7"  (1.702 m), weight 127.8 kg, SpO2 100 %.    Vent Mode: PRVC FiO2 (%):  [40 %] 40 % Set Rate:  [27 bmp] 27 bmp Vt Set:  [550 mL] 550 mL PEEP:  [5 cmH20] 5 cmH20 Plateau Pressure:  [17 cmH20-24 cmH20] 22 cmH20   Intake/Output Summary (Last 24 hours) at 09/13/2022 0733 Last data filed at 09/13/2022 0654 Gross per 24 hour  Intake 2875.23 ml  Output 5175 ml  Net -  2299.77 ml    Filed Weights    09/11/22 0500 09/12/22 0316 09/13/22 0500  Weight: 124.9 kg 128.5 kg 127.8 kg   Physical Examination: General: Acute-on-chronically ill-appearing middle-aged man in NAD. HEENT: [place]/AT, anicteric sclera, PERRL 2.25mm, moist mucous membranes. ETT/OGT in place. Increased oral secretions, blood-tinged. Neuro: Sedated. Responds to noxious stimuli. Withdraws to pain in RUE/RLE.Not following commands. Unilateral L-sided neglect noted. +Cough and +Gag  CV: RRR, no m/g/r. PULM: Breathing mildly labored on vent (PEEP 5, FiO2 40%; increased peak pressures). Increased oral secretions, blood tinged. Lung fields coarse throughout, R > L. GI: Soft, nontender, moderately distended, +tympanic on percussion. Hypoactive bowel sounds. Midline ventral hernia noted, reducible. Extremities: Bilateral symmetric 1+ LE edema noted. Skin: Warm/dry, no rashes.  Resolved Hospital Problem List:    Assessment & Plan:  Intracranial hemorrhage/right ICA ruptured aneurysm status post coiling: - Last seen by NSGY 10/1 - Goal SBP < 160 - Hold all AC/antiplatelet agents - Continue nimotop - Sedation adjustment as able to allow for accurate neurologic exam - Continue Precedex gtt, Fentanyl/Versed PRN - Neuroprotective measures: HOB > 30 degrees, normoglycemia, normothermia, electrolytes WNL   Hypertension: Longstanding, multidrug at home. - Goal SBP < 160 - Cleviprex titrated to goal SBP - Continue metoprolol, hydralazine, clonidine - Nimotop for vasospasm prevention, as above  Toxic metabolic encephalopathy: With intermittent increases in continuous IV sedation for ventilator synchrony.  Unable to get reliable exam. - Continue VT Klonopin, oxycodone - Wean sedation as able to allow for accurate neurologic assessment - Hopeful that sedation needs will decrease after tracheostomy  Acute hypoxemic respiratory failure: In the setting of encephalopathy and mild volume overload as well as MSSA pneumonia status post  treatment. Bronchospasm: With increased peak pressures with peak to plateau was elevated.  - Continue full vent support (4-8cc/kg IBW) - Wean FiO2 for O2 sat > 90% - Daily WUA/SBT - VAP bundle - Bronchodilators (Brovana/Yupelri, scheduled DuoNebs, albuterol PRN) - Solumedrol 40mg  daily x 7 days, then taper - Lasix 60mg  IV x 1 today for diuresis, given pulmonary edema on CXR - Pulmonary hygiene - PAD protocol for sedation: Precedex, Fentanyl, and Versed for goal RASS 0 to -1 - Tracheostomy today, 10/10 for definitive airway management to facilitate vent weaning  Fever, Leukocytosis, likely multifactorial; reactive with ICH versus PNA Bcx 10/6 1/4 staph epi - contaminant suspected, repeat blood cultures. CXR with persistent infiltrates. Resp Cx 10/5 with few yeast, no other organisms - Continue cefazolin x 5-day course (end 10/12) - Narrow as able pending Cx data - Low threshold for repeat Resp Cx if clinically worsening  Acute kidney injury: Improving - Trend BMP - Replete electrolytes as indicated - Assess diuresis needs daily, Lasix 60mg  IV x 1 today as above - Monitor I&Os - Avoid nephrotoxic agents as able - Ensure adequate renal perfusion  Hypernatremia: Likely in the setting of salt load with IV fluids as well as diuresis - OK for permissive hypernatremia (goal ~150) in the setting of ICH - FWF to maintain Na goal   Hyperglycemia: Worse with steroids, improving with insulin changes - Basal Semglee 30U daily - TF coverage 5U daily - SSI - CBGs Q4H - Goal CBG 140-180  Best Practice: (right click and "Reselect all SmartList Selections" daily)   Diet/type: tubefeeds DVT prophylaxis: SCDs GI prophylaxis: PPI Lines: Arterial Line and yes and it is still needed Foley:  Yes, and it is still needed Code Status:  full code  Best Practice: (right click and "Reselect all  SmartList Selections" daily)   The patient is critically ill with multiple organ system failure and  requires high complexity decision making for assessment and support, frequent evaluation and titration of therapies, advanced monitoring, review of radiographic studies and interpretation of complex data.   Critical Care Time devoted to patient care services, exclusive of separately billable procedures, described in this note is 36 minutes.  Lestine Mount, PA-C [facility] 09/13/22 7:33 AM  Please see [email or web address] for pager details.  From 7A-7P if no response, please call [phone] After hours, please call ELink [phone]

## 2022-09-13 NOTE — Progress Notes (Signed)
SLP Cancellation Note  Patient Details Name: Dustin Jones MRN: [id number] DOB: 1961-04-16   Cancelled treatment:       Reason Eval/Treat Not Completed: Patient not medically ready   Layza Summa, Katherene Ponto 09/13/2022, 8:39 AM

## 2022-09-13 NOTE — Procedures (Signed)
Diagnostic Bronchoscopy  Dustin Jones  [id number]  07-17-1961  Date:09/13/22  Time:12:30 PM   Provider Performing:Xavior Niazi Cipriano Mile   Procedure: Diagnostic Bronchoscopy with BAL  Indication(s) Assist with direct visualization of tracheostomy placement  Consent Risks of the procedure as well as the alternatives and risks of each were explained to the patient and/or caregiver.  Consent for the procedure was obtained.   Anesthesia See separate tracheostomy note   Time Out Verified patient identification, verified procedure, site/side was marked, verified correct patient position, special equipment/implants available, medications/allergies/relevant history reviewed, required imaging and test results available.   Sterile Technique Usual hand hygiene, masks, gowns, and gloves were used   Procedure Description Bronchoscope advanced through endotracheal tube and into airway.  BAL performed in RML with cloudy return.  After suctioning out tracheal secretions, bronchoscope used to provide direct visualization of tracheostomy placement.   Complications/Tolerance None; patient tolerated the procedure well.   EBL None  Specimen(s) None

## 2022-09-14 ENCOUNTER — Inpatient Hospital Stay (HOSPITAL_COMMUNITY): Payer: Medicaid Other

## 2022-09-14 DIAGNOSIS — Z930 Tracheostomy status: Secondary | ICD-10-CM

## 2022-09-14 DIAGNOSIS — J95851 Ventilator associated pneumonia: Secondary | ICD-10-CM

## 2022-09-14 DIAGNOSIS — E870 Hyperosmolality and hypernatremia: Secondary | ICD-10-CM

## 2022-09-14 DIAGNOSIS — R509 Fever, unspecified: Secondary | ICD-10-CM

## 2022-09-14 DIAGNOSIS — D72829 Elevated white blood cell count, unspecified: Secondary | ICD-10-CM

## 2022-09-14 LAB — CBC WITH DIFFERENTIAL/PLATELET
Abs Immature Granulocytes: 0.19 10*3/uL — ABNORMAL HIGH (ref 0.00–0.07)
Basophils Absolute: 0.1 10*3/uL (ref 0.0–0.1)
Basophils Relative: 0 %
Eosinophils Absolute: 0.1 10*3/uL (ref 0.0–0.5)
Eosinophils Relative: 0 %
HCT: 41.4 % (ref 39.0–52.0)
Hemoglobin: 13 g/dL (ref 13.0–17.0)
Immature Granulocytes: 0 %
Lymphocytes Relative: 72 %
Lymphs Abs: 33.6 10*3/uL — ABNORMAL HIGH (ref 0.7–4.0)
MCH: 30.1 pg (ref 26.0–34.0)
MCHC: 31.4 g/dL (ref 30.0–36.0)
MCV: 95.8 fL (ref 80.0–100.0)
Monocytes Absolute: 0.7 10*3/uL (ref 0.1–1.0)
Monocytes Relative: 2 %
Neutro Abs: 11.8 10*3/uL — ABNORMAL HIGH (ref 1.7–7.7)
Neutrophils Relative %: 26 %
Platelets: 201 10*3/uL (ref 150–400)
RBC: 4.32 MIL/uL (ref 4.22–5.81)
RDW: 13.3 % (ref 11.5–15.5)
Smear Review: NORMAL
WBC: 46.5 10*3/uL — ABNORMAL HIGH (ref 4.0–10.5)
nRBC: 0 % (ref 0.0–0.2)

## 2022-09-14 LAB — POCT I-STAT 7, (LYTES, BLD GAS, ICA,H+H)
Acid-Base Excess: 4 mmol/L — ABNORMAL HIGH (ref 0.0–2.0)
Bicarbonate: 26.7 mmol/L (ref 20.0–28.0)
Calcium, Ion: 1.25 mmol/L (ref 1.15–1.40)
HCT: 37 % — ABNORMAL LOW (ref 39.0–52.0)
Hemoglobin: 12.6 g/dL — ABNORMAL LOW (ref 13.0–17.0)
O2 Saturation: 98 %
Patient temperature: 102.5
Potassium: 4 mmol/L (ref 3.5–5.1)
Sodium: 154 mmol/L — ABNORMAL HIGH (ref 135–145)
TCO2: 28 mmol/L (ref 22–32)
pCO2 arterial: 37.5 mmHg (ref 32–48)
pH, Arterial: 7.468 — ABNORMAL HIGH (ref 7.35–7.45)
pO2, Arterial: 104 mmHg (ref 83–108)

## 2022-09-14 LAB — MAGNESIUM: Magnesium: 2.4 mg/dL (ref 1.7–2.4)

## 2022-09-14 LAB — TRIGLYCERIDES: Triglycerides: 278 mg/dL — ABNORMAL HIGH (ref <150)

## 2022-09-14 LAB — GLUCOSE, CAPILLARY
Glucose-Capillary: 103 mg/dL — ABNORMAL HIGH (ref 70–99)
Glucose-Capillary: 123 mg/dL — ABNORMAL HIGH (ref 70–99)
Glucose-Capillary: 132 mg/dL — ABNORMAL HIGH (ref 70–99)
Glucose-Capillary: 192 mg/dL — ABNORMAL HIGH (ref 70–99)
Glucose-Capillary: 226 mg/dL — ABNORMAL HIGH (ref 70–99)
Glucose-Capillary: 245 mg/dL — ABNORMAL HIGH (ref 70–99)
Glucose-Capillary: 249 mg/dL — ABNORMAL HIGH (ref 70–99)

## 2022-09-14 LAB — BASIC METABOLIC PANEL
Anion gap: 8 (ref 5–15)
BUN: 40 mg/dL — ABNORMAL HIGH (ref 8–23)
CO2: 26 mmol/L (ref 22–32)
Calcium: 9.4 mg/dL (ref 8.9–10.3)
Chloride: 119 mmol/L — ABNORMAL HIGH (ref 98–111)
Creatinine, Ser: 1.02 mg/dL (ref 0.61–1.24)
GFR, Estimated: >60 mL/min (ref >60)
Glucose, Bld: 145 mg/dL — ABNORMAL HIGH (ref 70–99)
Potassium: 4.4 mmol/L (ref 3.5–5.1)
Sodium: 153 mmol/L — ABNORMAL HIGH (ref 135–145)

## 2022-09-14 LAB — PHOSPHORUS: Phosphorus: 4.4 mg/dL (ref 2.5–4.6)

## 2022-09-14 LAB — CULTURE, BLOOD (ROUTINE X 2)
Culture: NO GROWTH
Special Requests: ADEQUATE

## 2022-09-14 MED ORDER — LISINOPRIL 20 MG PO TABS
20.0000 mg | ORAL_TABLET | Freq: Every day | ORAL | Status: DC
  Administered 2022-09-14 – 2022-09-17 (×4): 20 mg
  Filled 2022-09-14 (×4): qty 1

## 2022-09-14 MED ORDER — LABETALOL HCL 5 MG/ML IV SOLN
10.0000 mg | INTRAVENOUS | Status: DC | PRN
  Administered 2022-09-14: 10 mg via INTRAVENOUS
  Filled 2022-09-14: qty 4

## 2022-09-14 MED ORDER — FUROSEMIDE 10 MG/ML IJ SOLN
60.0000 mg | Freq: Once | INTRAMUSCULAR | Status: AC
  Administered 2022-09-14: 60 mg via INTRAVENOUS
  Filled 2022-09-14: qty 6

## 2022-09-14 MED ORDER — HYDROCHLOROTHIAZIDE 25 MG PO TABS
25.0000 mg | ORAL_TABLET | Freq: Every day | ORAL | Status: DC
  Administered 2022-09-14 – 2022-09-27 (×14): 25 mg
  Filled 2022-09-14 (×14): qty 1

## 2022-09-14 MED ORDER — HYDRALAZINE HCL 20 MG/ML IJ SOLN
10.0000 mg | Freq: Once | INTRAMUSCULAR | Status: AC
  Administered 2022-09-14: 10 mg via INTRAVENOUS
  Filled 2022-09-14: qty 1

## 2022-09-14 MED ORDER — LABETALOL HCL 5 MG/ML IV SOLN
20.0000 mg | INTRAVENOUS | Status: AC | PRN
  Administered 2022-09-14 – 2022-09-15 (×3): 20 mg via INTRAVENOUS
  Filled 2022-09-14 (×3): qty 4

## 2022-09-14 MED ORDER — FREE WATER
200.0000 mL | Status: DC
  Administered 2022-09-14 – 2022-09-15 (×23): 200 mL

## 2022-09-14 MED ORDER — AMLODIPINE BESYLATE 10 MG PO TABS
10.0000 mg | ORAL_TABLET | Freq: Every day | ORAL | Status: DC
  Administered 2022-09-14 – 2022-10-27 (×44): 10 mg
  Filled 2022-09-14 (×44): qty 1

## 2022-09-14 MED ORDER — SODIUM CHLORIDE 0.9 % IV SOLN
2.0000 g | Freq: Three times a day (TID) | INTRAVENOUS | Status: AC
  Administered 2022-09-14 – 2022-09-21 (×21): 2 g via INTRAVENOUS
  Filled 2022-09-14 (×23): qty 12.5

## 2022-09-14 NOTE — Progress Notes (Signed)
0445: Pt's BP continues to be elevated despite increasing Cleviprex to max dose. ELink made aware.  8250: Labetalol 10 mg ordered and given.  0600: BP did not respond to Labetalol. Elink made aware.

## 2022-09-14 NOTE — Progress Notes (Signed)
NAME:  Dustin Jones, MRN:  [id number], DOB:  [birth year], LOS: 60 ADMISSION DATE:  09/26/22, CONSULTATION DATE:  September 26, 2022 REFERRING MD:  Maryan Rued - EDP CHIEF COMPLAINT:  Intracranial hemorrhage   History of Present Illness:  62 year old man who presented to [facility] 9/28 as a Code Stroke. PMHx significant for HTN, DVT and chronic tobacco use.  LKW ~0300 9/28. Found down ~0800 by bystanders who called 911. EMS reported the patient had L-sided deficits and AMS. Systolic BP reported to be in the 190s per EMS. CT Head demonstrated acute intraparenchymal hemorrhage with possible lobulated 9mm outpouching arising from the supraclinoid right ICA, suspicious for ruptured aneurysm. Admitted by Neurosurgery for close monitoring and further diagnostic testing.   PCCM was consulted for ventilator management.   Pertinent Medical History:  Hypertension History of DVT Hx Etoh use, not quantified HX of Tobacco use, > 25 pack years  Significant Hospital Events: Including procedures, antibiotic start and stop dates in addition to other pertinent events   9/28 admitted. Intracranial Hemorrhage/ R MCA ruptured aneurysm Non-con CT of head demonstrating large acute hemorrhage in the R lentiform nucleus with with intraventricular extension, midline shift, and slight rounding of the temporal horns, possibly representing early hydrocephalus. On CTA, pt found to have a R MCA ruptured aneurysm. Neurosurgery consulted and following with plans for possible arterial clipping this evening. CT of the face and cervical spine did not show any acute traumatic injuries, though BL cervical lymphadenopathy was noted. Echo with normal biventricular function without evidence of hemodynamically sig valvular heart disease.  9/28 complete echo with LVEF of 60-65%, no regional wall motion abnormalities but with grade 1 diastolic dysfunction.  Normal right ventricular systolic function.  No valvular abnormalities. 9/29 MRI brain 1. Large  acute intraparenchymal hemorrhage centered in the rightlentiform nucleus, with extension into the right-greater-than-left lateral ventricle, third ventricle, and fourth ventricle, overall similar to the prior CT. Mild mass effect and 5 mm of right-to-left midline shift. No hydrocephalus. 2. Small areas of acute infarct in the bilateral occipital lobes, bilateral frontal lobes, and right parietal lobe. Spiking fever. Has sig leukocytosis. Cultures sent. Unasyn started for aspiration  9/29 respiratory culture positive for MSSA 9/29 self extubated, required reintubation 10/2 Family identified  10/6 bronchospasm, steroids of this, bronc with clear lungs, ETT 10/9 Neuro exam/mental status remains poor. Coming down on vent requirements. BP improved, remains off of Cleviprex. D/w family re: tracheostomy. 10/10 Remains hypertensive, agitation. Sedation adjusted (Precedex, Fentanyl/Versed PRN). Increased peak pressures on vent. CXR worse with asymmetric pulmonary edema. Lasix given. Trach with CCM. 10/11 Cortrak placed  Interim History / Subjective:  Trach yesterday, tolerated well. Weaning on PSV 5/5 this AM.  Objective:  Blood pressure 135/84, pulse 76, temperature (!) 100.5 F (38.1 C), temperature source Rectal, resp. rate (!) 22, height 5\' 7"  (1.702 m), weight 126.7 kg, SpO2 98 %.    Vent Mode: PRVC FiO2 (%):  [80 %] 80 % Set Rate:  [27 bmp] 27 bmp Vt Set:  [550 mL] 550 mL PEEP:  [5 cmH20] 5 cmH20 Plateau Pressure:  [13 cmH20-18 cmH20] 13 cmH20   Intake/Output Summary (Last 24 hours) at 09/14/2022 0844 Last data filed at 09/14/2022 0700 Gross per 24 hour  Intake 2050.87 ml  Output 4650 ml  Net -2599.13 ml    Filed Weights   09/12/22 0316 09/13/22 0500 09/14/22 0500  Weight: 128.5 kg 127.8 kg 126.7 kg   Physical Examination: General: Chronically ill appearing male, resting in bed, in NAD. Neuro:  Sedated, slow to respond. HEENT: [place]/AT. Sclerae anicteric. Trach C/D/I with mod  secretions. Cardiovascular: RRR, no M/R/G.  Lungs: Respirations even and unlabored.  CTA bilaterally, No W/R/R. Abdomen: BS x 4, soft, NT/ND.  Musculoskeletal: No gross deformities, no edema.  Skin: Intact, warm, no rashes.  Assessment & Plan:   Intracranial hemorrhage/right ICA ruptured aneurysm status post coiling: - Last seen by NSGY 10/1 - Goal SBP < 160 - Hold all AC/antiplatelet agents - Continue nimotop - Sedation adjustment as able to allow for accurate neurologic exam - Continue Precedex gtt, Fentanyl/Versed PRN - Neuroprotective measures: HOB > 30 degrees, normoglycemia, normothermia, electrolytes WNL   Hypertension: Longstanding, multidrug at home. Has not been well controlled but now that we have enteral access, hopefully can start to have better control - Goal SBP < 160 - Cleviprex titrated to goal SBP - Continue metoprolol, hydralazine, clonidine - Nimotop for vasospasm prevention, as above  Toxic metabolic encephalopathy: With intermittent increases in continuous IV sedation for ventilator synchrony.  Unable to get reliable exam. - Continue VT Klonopin, oxycodone - Wean sedation as able to allow for accurate neurologic assessment - Hopeful that sedation needs will decrease now s/p tracheostomy  Acute hypoxemic respiratory failure: In the setting of encephalopathy and mild volume overload as well as MSSA pneumonia status post treatment. Now s/p tracheostomy 10/11 Bronchospasm - Started PSV 5/5 this AM 10/11, continue and work towards ATC as tolerated - Bronchodilators (Brovana/Yupelri, scheduled DuoNebs, albuterol PRN) - Solumedrol 40mg  daily x 7 days, then taper (starting 10/12) - Pulmonary hygiene - Intermittent CXR  Fever, Leukocytosis, likely multifactorial; reactive with ICH versus PNA Bcx 10/6 1/4 staph epi - contaminant suspected, repeat blood cultures. CXR with persistent infiltrates. Resp Cx 10/5 with few yeast, no other organisms - Continue cefazolin x  5-day course (end 10/12) - Narrow as able pending Cx data - Low threshold for repeat Resp Cx if clinically worsening  Acute kidney injury: Improved - Trend BMP - Replete electrolytes as indicated - Assess diuresis needs daily, will hold today as had great output after one dose 10/10 and currently -5L net. - Monitor I&Os - Avoid nephrotoxic agents as able - Ensure adequate renal perfusion  Hypernatremia: Likely in the setting of salt load with IV fluids as well as diuresis - OK for permissive hypernatremia (goal ~150) in the setting of ICH - FWF to maintain Na goal   Hyperglycemia: Worse with steroids, improving with insulin changes - Basal Semglee 10U daily - TF coverage 5U daily - SSI - CBGs Q4H - Goal CBG 140-180  Best Practice: (right click and "Reselect all SmartList Selections" daily)   Diet/type: tubefeeds DVT prophylaxis: SCDs GI prophylaxis: PPI Lines: Arterial Line and yes and it is still needed Foley:  Yes, and it is still needed Code Status:  full code   CC time: 30 min.   12/12, PA - [facility] For pager details, please see AMION or use Epic chat  After 1900, please call [facility] for cross coverage needs 09/14/2022, 9:14 AM

## 2022-09-14 NOTE — Progress Notes (Signed)
   09/14/22 0745  Vitals  Pulse Rate 76  ECG Heart Rate 74  Resp (!) 22  Oxygen Therapy  SpO2 98 %  Art Line  Arterial Line BP 192/76  Arterial Line MAP (mmHg) 102 mmHg  MEWS Score  MEWS Temp 1  MEWS Systolic 0  MEWS Pulse 0  MEWS RR 1  MEWS LOC 2  MEWS Score 4  MEWS Score Color Red  Provider Notification  Provider Name/Title Carola Rhine  Date Provider Notified 09/14/22  Time Provider Notified [phone]  Method of Notification Face-to-face  Notification Reason Other (Comment) (bp too high, need PRNs)  Provider response See new orders  Date of Provider Response 09/14/22  Time of Provider Response [phone]

## 2022-09-14 NOTE — Evaluation (Signed)
Physical Therapy Evaluation Patient Details Name: Dustin Jones MRN: [id number] DOB: [birth year] Today's Date: 09/14/2022  History of Present Illness  The pt is a 61 y.o. unidentified homeless male presenting 9/28 after being found down by bystanders. 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 S/P coil embolization of R ICA aneurysm 9/28. Intubated 9/29. Trach placement 10/10. No PMH on file.   Clinical Impression  Pt presents with condition above and deficits mentioned below, see PT Problem List. Pt is flaccid throughout all 4 limbs with no noted reaction/active movement, even with noxious/painful stimuli. Pt only grimaced when trach suctioning was performed by RN. He remained lethargic with eyes closed throughout session, despite TA rolling either direction. No family was present, but chart revealed pt was homeless, thus assuming he likely was IND-mod I prior to admission. At this time, recommending rehab at a SNF vs LTACH pending pt's progress in arousal and participation. Will continue to follow acutely.        Recommendations for follow up therapy are one component of a multi-disciplinary discharge planning process, led by the attending physician.  Recommendations may be updated based on patient status, additional functional criteria and insurance authorization.  Follow Up Recommendations Skilled nursing-short term rehab (<3 hours/day) (vs LTACH) Can patient physically be transported by private vehicle: No    Assistance Recommended at Discharge Frequent or constant Supervision/Assistance  Patient can return home with the following  Two people to help with walking and/or transfers;Two people to help with bathing/dressing/bathroom;Assistance with cooking/housework;Assistance with feeding;Direct supervision/assist for medications management;Direct supervision/assist for financial  management;Help with stairs or ramp for entrance;Assist for transportation    Equipment Recommendations Other (comment) (TBA)  Recommendations for Other Services  OT consult    Functional Status Assessment Patient has had a recent decline in their functional status and demonstrates the ability to make significant improvements in function in a reasonable and predictable amount of time.     Precautions / Restrictions Precautions Precautions: Fall;Other (comment) Precaution Comments: trach on vent; SBP < 160; Art-line; flexiseal Restrictions Weight Bearing Restrictions: No      Mobility  Bed Mobility Overal bed mobility: Needs Assistance Bed Mobility: Rolling Rolling: Total assist, +2 for physical assistance, +2 for safety/equipment         General bed mobility comments: TAx2 to roll pt either direction in bed to change bed pad. No arousal or movement noted by pt.    Transfers                   General transfer comment: deferred due to low level of arousal    Ambulation/Gait               General Gait Details: deferred due to low level of arousal  Stairs            Wheelchair Mobility    Modified Rankin (Stroke Patients Only) Modified Rankin (Stroke Patients Only) Pre-Morbid Rankin Score: No symptoms Modified Rankin: Severe disability     Balance Overall balance assessment: Needs assistance                                           Pertinent Vitals/Pain Pain Assessment Pain Assessment: Faces Faces Pain Scale: Hurts a little bit Pain Location: grimacing with RN suctioning pt Pain Descriptors / Indicators: Grimacing  Pain Intervention(s): Limited activity within patient's tolerance, Monitored during session    Home Living Family/patient expects to be discharged to:: Shelter/Homeless                   Additional Comments: Per chart, pt is homeless    Prior Function Prior Level of Function : Patient poor  historian/Family not available             Mobility Comments: Unable to arouse pt and no family present. Per chart, pt was homeless. Assuming pt was likely IND-modI.       Hand Dominance        Extremity/Trunk Assessment   Upper Extremity Assessment Upper Extremity Assessment: Defer to OT evaluation (flaccid throughout bil; no active movement noted bil)    Lower Extremity Assessment Lower Extremity Assessment: Generalized weakness (flaccid with no active movement noted bil; WFL PROM bil, except slight tightness noted with ankle dorsiflexion (PROM ~5-10 degrees bil); no clonus or babinski noted bil; no reaction to noxious or painful simuli bil)    Cervical / Trunk Assessment Cervical / Trunk Assessment: Other exceptions Cervical / Trunk Exceptions: increased body habitus  Communication   Communication: Tracheostomy  Cognition Arousal/Alertness: Lethargic Behavior During Therapy: Flat affect Overall Cognitive Status: Difficult to assess                                 General Comments: Pt lathergic and unable to arouse throughout session, despite painful and noxious stimuli or dependent bed mobility. Pt only grimacing with suctioning by RN. Did not follow any cues or track with eyes when eye lids dependently opened        General Comments General comments (skin integrity, edema, etc.): Trach on vent PRVC setting 50% FiO2 PEEP 5; ordered prevalon boots for pt    Exercises     Assessment/Plan    PT Assessment Patient needs continued PT services  PT Problem List Decreased strength;Decreased activity tolerance;Decreased balance;Decreased mobility;Decreased coordination;Decreased cognition;Decreased knowledge of use of DME;Cardiopulmonary status limiting activity;Impaired sensation       PT Treatment Interventions DME instruction;Gait training;Functional mobility training;Therapeutic activities;Therapeutic exercise;Balance training;Neuromuscular  re-education;Cognitive remediation;Patient/family education;Wheelchair mobility training    PT Goals (Current goals can be found in the Care Plan section)  Acute Rehab PT Goals Patient Stated Goal: did not state PT Goal Formulation: Patient unable to participate in goal setting Time For Goal Achievement: 09/28/22 Potential to Achieve Goals: Fair    Frequency Min 2X/week     Co-evaluation               AM-PAC PT "6 Clicks" Mobility  Outcome Measure Help needed turning from your back to your side while in a flat bed without using bedrails?: Total Help needed moving from lying on your back to sitting on the side of a flat bed without using bedrails?: Total Help needed moving to and from a bed to a chair (including a wheelchair)?: Total Help needed standing up from a chair using your arms (e.g., wheelchair or bedside chair)?: Total Help needed to walk in hospital room?: Total Help needed climbing 3-5 steps with a railing? : Total 6 Click Score: 6    End of Session Equipment Utilized During Treatment: Oxygen Activity Tolerance: Patient limited by lethargy Patient left: in bed;with call bell/phone within reach;with bed alarm set Nurse Communication: Mobility status PT Visit Diagnosis: Muscle weakness (generalized) (M62.81);Difficulty in walking, not elsewhere classified (R26.2);Other symptoms  and signs involving the nervous system (R29.898)    Time: 0263-7858 PT Time Calculation (min) (ACUTE ONLY): 24 min   Charges:   PT Evaluation $PT Eval Moderate Complexity: 1 Mod PT Treatments $Therapeutic Activity: 8-22 mins        Raymond Gurney, PT, DPT Acute Rehabilitation Services  Office: [phone]   Jewel Baize 09/14/2022, 2:47 PM

## 2022-09-14 NOTE — Progress Notes (Signed)
SLP Cancellation Note  Patient Details Name: JAQUA CHING MRN: [id number] DOB: 1961-09-06   Cancelled treatment:       Reason Eval/Treat Not Completed: Patient not medically ready. Patient with new tracheostomy. Orders for SLP eval and treat for PMSV and swallowing received. Will follow pt closely for readiness for SLP interventions as appropriate.     Benno Brensinger, Katherene Ponto 09/14/2022, 10:45 AM

## 2022-09-14 NOTE — Progress Notes (Signed)
Aline dressing redressed at this time. New bio patch placed, line secure, flushes and pulls back.

## 2022-09-14 NOTE — Procedures (Signed)
Cortrak  Person Inserting Tube:  Kayda Allers C, RD Tube Type:  Cortrak - 43 inches Tube Size:  10 Tube Location:  Left nare Secured by: Bridle Technique Used to Measure Tube Placement:  Marking at nare/corner of mouth Cortrak Secured At:  72 cm   Cortrak Tube Team Note:  Consult received to place a Cortrak feeding tube.   X-ray is required, abdominal x-ray has been ordered by the Cortrak team. Please confirm tube placement before using the Cortrak tube.   If the tube becomes dislodged please keep the tube and contact the Cortrak team at [email or web address] for replacement.  If after hours and replacement cannot be delayed, place a NG tube and confirm placement with an abdominal x-ray.    Shaquandra Galano P., RD, LDN, CNSC See AMiON for contact information    

## 2022-09-14 NOTE — Progress Notes (Signed)
RT at bedside for afternoon rounds. RT setup trach collar to attempt on pt per MD verbal request. Pt at that time was in PS/CPAP mode with SpO2 of 89-90%, increased RR and increased WOB. RT suctioned pt with moderate amount of tan/pink secretions. RT changed and placed new HME and ballard with no improvement to SpO2. RT placed pt back on full ventilatory support. MD and RN will be notified. RT will continue to monitor pt.

## 2022-09-14 NOTE — Consult Note (Addendum)
[facility]                                                                                       Patient Identification: Patient Name: Dustin Jones MRN: [id number] Admit Date: 08/26/2022  9:01 AM Today's Date: 09/14/2022 Reason for consult: Fevers and leukocytosis Requesting provider: Jacky Kindle   Principal Problem:   ICH (intracerebral hemorrhage) ([place]) Active Problems:   Intracranial hemorrhage (HCC)   Pressure injury of skin   Acute respiratory failure with hypoxia ([place])   Aspiration pneumonia of both lungs (HCC)   Hypernatremia   Tracheostomy in place 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 Hospital course also significant for new diagnosis of CLL, tx for MSSA PNA. S/p tracheostomy on 10/11. ID engaged for fevers and chronic leukocytosis.   Fevers could be reactive in the setting of trach yesterday vs new VAP ( moderate tan to pink secretions ) vs neurogenic . Other less likley considerations occult abscesses in chest and abdomen as well as intraparenchymal hemorrhage turning into an abscess. No known DU as well as diarrhea per RN ( Flexiseal +)  Leukocytosis  - with known h/o CLL with leukemoid component vs VAP   Recommendations  Will switch cefazolin to cefepime as BAL cultures growing Enterobacter aerogenes.  Fu sensi  Will need to consider additional imaging like CT chest abdomen as well as brain MRI if worsening after # 1 change  Following  D/w PCCM  Rest of the management as per the primary team. Please call with questions or concerns.  Thank you for the consult  Rosiland Oz,  MD Infectious Disease Physician [facility] [address] [place], [place] [address] Phone: [phone]  Fax: [phone]  __________________________________________________________________________________________________________ HPI and Hospital Course: 61 year old male with PMH of HTN, DVT and chronic tobacco and alcohol use who presented to [facility]/28 as a code stroke.  Found down by bystanders who called 911.  EMS reported patient had left-sided deficits and altered mental status with hypertension. 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 Hospital course also significant for new diagnosis of CLL found in the setting of work up for persistent leukocytosis and bilateral cervical lymphadenopathy per flow cytometry on 09/09/22. S/p tracheostomy on 10/11.   Afebrile on presentation with intermittent ongoing low grade fevers with T max 102.5 yesterday. Initially WBC 70, was downtrending then increased to 46 today. Was also treated  approx 7 days course of tx for MSSA PNA. Blood cx 10/6 MSSE 1/4 bottles thought to be a contaminant, repeat blood cx 10/7 NGTD. BAL 10/10 with 80,000 colonies Enterobacter species.   Chest Xray 10/10 New right basilar and left perihilar pulmonary opacities, nonspecific, may represent atelectasis, aspiration or infection  ROS: unavailable due to patient's clinical condition   History reviewed. No pertinent past medical history.  Past Surgical History:  Procedure Laterality Date   IR ANGIO INTRA EXTRACRAN SEL INTERNAL CAROTID BILAT MOD SED  08/11/2022   IR ANGIOGRAM FOLLOW UP STUDY  08/10/2022   IR ANGIOGRAM FOLLOW UP STUDY  08/13/2022   IR TRANSCATH/EMBOLIZ  09/02/2022   RADIOLOGY WITH ANESTHESIA N/A 08/07/2022   Procedure: IR WITH ANESTHESIA;  Surgeon: Consuella Lose, MD;   Location: [place];  Service: Radiology;  Laterality: N/A;     Scheduled Meds:  amLODipine  10 mg Per Tube Daily   arformoterol  15 mcg Nebulization BID   Chlorhexidine Gluconate Cloth  6 each Topical Daily   clonazepam  1 mg Per Tube BID   cloNIDine  0.3 mg Per Tube TID   docusate  100 mg Per Tube BID   feeding supplement (PROSource TF20)  60 mL Per Tube BID   free water  200 mL Per Tube Q1H   heparin injection (subcutaneous)  5,000 Units Subcutaneous Q8H   hydrochlorothiazide  25 mg Per Tube Daily   insulin aspart  0-15 Units Subcutaneous Q4H   insulin aspart  5 Units Subcutaneous Q4H   insulin glargine-yfgn  10 Units Subcutaneous Daily   ipratropium-albuterol  3 mL Nebulization Q6H   lisinopril  20 mg Per Tube Daily   niMODipine  60 mg Oral Q4H   Or   niMODipine  60 mg Per Tube Q4H   mouth rinse  15 mL Mouth Rinse Q2H   oxyCODONE  5 mg Per Tube Q6H   pantoprazole  40 mg Per Tube Daily   polyethylene glycol  17 g Per Tube Daily   revefenacin  175 mcg Nebulization Daily   Continuous Infusions:  sodium chloride 10 mL/hr at 09/14/22 1000    ceFAZolin (ANCEF) IV Stopped (09/14/22 0601)   clevidipine 21 mg/hr (09/14/22 1013)   dexmedetomidine (PRECEDEX) IV infusion 0.8 mcg/kg/hr (09/14/22 1028)   feeding supplement (OSMOLITE 1.5 CAL) 1,000 mL (09/14/22 1000)   PRN Meds:.sodium chloride, acetaminophen **OR** acetaminophen (TYLENOL) oral liquid 160 mg/5 mL **OR** acetaminophen, albuterol, fentaNYL (SUBLIMAZE) injection, fentaNYL (SUBLIMAZE) injection, labetalol, midazolam, midazolam, mouth rinse  Not on File  Social History   Socioeconomic History   Marital status: Single    Spouse name: Not on file   Number of children: Not on file   Years of education: Not on file   Highest education level: Not on file  Occupational History   Not on file  Tobacco Use   Smoking status: Not on file   Smokeless tobacco: Not on file  Substance and Sexual Activity   Alcohol use: Not on file    Drug use: Not on file   Sexual activity: Not on file  Other Topics Concern   Not on file  Social History Narrative   Not on file   Social Determinants of Health   Financial Resource Strain: Not on file  Food Insecurity: Not on file  Transportation Needs: Not on file  Physical Activity: Not on file  Stress: Not on file  Social Connections: Not on file  Intimate Partner Violence: Not on file   History reviewed. No pertinent family history.   Vitals BP 130/72   Pulse 67   Temp 100.2 F (37.9 C) (Rectal)   Resp (!) 22   Ht 5\' 7"  (1.702 m)   Wt 126.7 kg   SpO2 92%   BMI 43.75 kg/m  Physical Exam Constitutional:  critically ill male lying in the bed and non responsive, eyes closed, does not respond to voice, morbidly obese     Comments: Feeding tube in place, tracheostomy +, site looks OK   Cardiovascular:     Rate and Rhythm: Normal rate and regular rhythm.     Heart sounds:  Pulmonary:     Effort: Pulmonary effort is normal on vent , Fio2 50%    Comments: Coarse breath sounds bilateral   Abdominal:     Palpations: Abdomen is soft.     Tenderness: non distended  and non tender   Musculoskeletal:        General: No swelling or tenderness in peripheral joints   Skin:    Comments: No DU per RN, could not examine due to habitus  Neurological:     General: not responsive    Pertinent Microbiology Results for orders placed or performed during the hospital encounter of 08/27/2022  Resp Panel by RT-PCR (Flu A&B, Covid) Anterior Nasal Swab     Status: None   Collection Time: 08/21/2022  9:01 AM   Specimen: Anterior Nasal Swab  Result Value Ref Range Status   SARS Coronavirus 2 by RT PCR NEGATIVE NEGATIVE Final    Comment: (NOTE) SARS-CoV-2 target nucleic acids are NOT DETECTED.  The SARS-CoV-2 RNA is generally detectable in upper respiratory specimens during the acute phase of infection. The lowest concentration of SARS-CoV-2 viral copies this assay can  detect is 138 copies/mL. A negative result does not preclude SARS-Cov-2 infection and should not be used as the sole basis for treatment or other patient management decisions. A negative result may occur with  improper specimen collection/handling, submission of specimen other than nasopharyngeal swab, presence of viral mutation(s) within the areas targeted by this assay, and inadequate number of viral copies(<138 copies/mL). A negative result must be combined with clinical observations, patient history, and epidemiological information. The expected result is Negative.  Fact Sheet for Patients:  EntrepreneurPulse.com.au  Fact Sheet for Healthcare Providers:  IncredibleEmployment.be  This test is no t yet approved or cleared by the [place] FDA and  has been authorized for detection and/or diagnosis of SARS-CoV-2 by FDA under an Emergency Use Authorization (EUA). This EUA will remain  in effect (meaning this test can be used) for the duration of the COVID-19 declaration under Section 564(b)(1) of the Act, 21 U.S.C.section 360bbb-3(b)(1), unless the authorization is terminated  or revoked sooner.       Influenza A by PCR NEGATIVE NEGATIVE Final   Influenza B by PCR NEGATIVE NEGATIVE Final    Comment: (NOTE) The Xpert Xpress SARS-CoV-2/FLU/RSV plus assay is intended as an aid in the diagnosis of influenza from Nasopharyngeal swab specimens and should not be used as a sole basis for treatment. Nasal washings and aspirates are unacceptable for Xpert Xpress SARS-CoV-2/FLU/RSV testing.  Fact Sheet for Patients: EntrepreneurPulse.com.au  Fact Sheet for Healthcare Providers: IncredibleEmployment.be  This test is not yet approved or cleared by the [place] FDA and has been authorized for detection and/or diagnosis of SARS-CoV-2 by FDA under an Emergency Use Authorization (EUA). This EUA will remain in  effect (meaning this test can be used) for the duration of the COVID-19 declaration under Section 564(b)(1) of the Act, 21 U.S.C. section 360bbb-3(b)(1), unless the authorization is terminated or revoked.  Performed at [facility], [place] [address], [place], [place] [address]   MRSA Next Gen by PCR, Nasal     Status:  None   Collection Time: 09/02/2022  4:30 PM   Specimen: Nasal Mucosa; Nasal Swab  Result Value Ref Range Status   MRSA by PCR Next Gen NOT DETECTED NOT DETECTED Final    Comment: (NOTE) The GeneXpert MRSA Assay (FDA approved for NASAL specimens only), is one component of a comprehensive MRSA colonization surveillance program. It is not intended to diagnose MRSA infection nor to guide or monitor treatment for MRSA infections. Test performance is not FDA approved in patients less than 66 years old. Performed at [facility], [place] [address], [place], [place] [address]   Culture, Respiratory w Gram Stain     Status: None   Collection Time: 09/02/22 10:08 AM   Specimen: Tracheal Aspirate; Respiratory  Result Value Ref Range Status   Specimen Description TRACHEAL ASPIRATE  Final   Special Requests NONE  Final   Gram Stain   Final    RARE WBC PRESENT,BOTH PMN AND MONONUCLEAR FEW GRAM POSITIVE COCCI IN PAIRS Performed at [facility], [address] [place], [place] [address]    Culture MODERATE STAPHYLOCOCCUS AUREUS  Final   Report Status 09/04/2022 FINAL  Final   Organism ID, Bacteria STAPHYLOCOCCUS AUREUS  Final      Susceptibility   Staphylococcus aureus - MIC*    CIPROFLOXACIN <=0.5 SENSITIVE Sensitive     ERYTHROMYCIN >=8 RESISTANT Resistant     GENTAMICIN <=0.5 SENSITIVE Sensitive     OXACILLIN 0.5 SENSITIVE Sensitive     TETRACYCLINE <=1 SENSITIVE Sensitive     VANCOMYCIN 1 SENSITIVE Sensitive     TRIMETH/SULFA <=10 SENSITIVE Sensitive     CLINDAMYCIN RESISTANT Resistant     RIFAMPIN <=0.5 SENSITIVE Sensitive     Inducible Clindamycin  POSITIVE Resistant     * MODERATE STAPHYLOCOCCUS AUREUS  Culture, Respiratory w Gram Stain     Status: None   Collection Time: 09/08/22 10:58 PM   Specimen: Tracheal Aspirate; Respiratory  Result Value Ref Range Status   Specimen Description TRACHEAL ASPIRATE  Final   Special Requests Normal  Final   Gram Stain   Final    RARE WBC PRESENT, PREDOMINANTLY PMN FEW BUDDING YEAST SEEN Performed at [facility], [place] [address], [place], [place] [address]    Culture FEW CANDIDA DUBLINIENSIS  Final   Report Status 09/12/2022 FINAL  Final  Culture, blood (Routine X 2) w Reflex to ID Panel     Status: None (Preliminary result)   Collection Time: 09/09/22 12:32 AM   Specimen: BLOOD LEFT ARM  Result Value Ref Range Status   Specimen Description BLOOD LEFT ARM  Final   Special Requests   Final    BOTTLES DRAWN AEROBIC AND ANAEROBIC Blood Culture adequate volume   Culture   Final    NO GROWTH 4 DAYS Performed at [facility], [place] [address] [place],  [address]    Report Status PENDING  Incomplete  Culture, blood (Routine X 2) w Reflex to ID Panel     Status: Abnormal   Collection Time: 09/09/22 12:32 AM   Specimen: BLOOD LEFT WRIST  Result Value Ref Range Status   Specimen Description BLOOD LEFT WRIST  Final   Special Requests   Final    IN PEDIATRIC BOTTLE Blood Culture results may not be optimal due to an inadequate volume of blood received in culture bottles   Culture  Setup Time   Final    GRAM POSITIVE COCCI IN PAIRS IN CLUSTERS IN  PEDIATRIC BOTTLE CRITICAL RESULT CALLED TO, READ BACK BY AND VERIFIED WITH: PHARMD CAREN AMEND ON 09/09/22 @ 1952 BY DRT    Culture (A)  Final    STAPHYLOCOCCUS EPIDERMIDIS THE SIGNIFICANCE OF ISOLATING THIS ORGANISM FROM A SINGLE SET OF BLOOD CULTURES WHEN MULTIPLE SETS ARE DRAWN IS UNCERTAIN. PLEASE NOTIFY THE MICROBIOLOGY DEPARTMENT WITHIN ONE WEEK IF SPECIATION AND SENSITIVITIES ARE REQUIRED. Performed at [facility],  [place] [address], [place], [place] [address]    Report Status 09/10/2022 FINAL  Final  Blood Culture ID Panel (Reflexed)     Status: Abnormal   Collection Time: 09/09/22 12:32 AM  Result Value Ref Range Status   Enterococcus faecalis NOT DETECTED NOT DETECTED Final   Enterococcus Faecium NOT DETECTED NOT DETECTED Final   Listeria monocytogenes NOT DETECTED NOT DETECTED Final   Staphylococcus species DETECTED (A) NOT DETECTED Final    Comment: CRITICAL RESULT CALLED TO, READ BACK BY AND VERIFIED WITH: PHARMD CAREN AMEND ON 09/09/22 @ 1952 BY DRT    Staphylococcus aureus (BCID) NOT DETECTED NOT DETECTED Final   Staphylococcus epidermidis DETECTED (A) NOT DETECTED Final    Comment: CRITICAL RESULT CALLED TO, READ BACK BY AND VERIFIED WITH: PHARMD CAREN AMEND ON 09/09/22 @ 1952 BY DRT    Staphylococcus lugdunensis NOT DETECTED NOT DETECTED Final   Streptococcus species NOT DETECTED NOT DETECTED Final   Streptococcus agalactiae NOT DETECTED NOT DETECTED Final   Streptococcus pneumoniae NOT DETECTED NOT DETECTED Final   Streptococcus pyogenes NOT DETECTED NOT DETECTED Final   A.calcoaceticus-baumannii NOT DETECTED NOT DETECTED Final   Bacteroides fragilis NOT DETECTED NOT DETECTED Final   Enterobacterales NOT DETECTED NOT DETECTED Final   Enterobacter cloacae complex NOT DETECTED NOT DETECTED Final   Escherichia coli NOT DETECTED NOT DETECTED Final   Klebsiella aerogenes NOT DETECTED NOT DETECTED Final   Klebsiella oxytoca NOT DETECTED NOT DETECTED Final   Klebsiella pneumoniae NOT DETECTED NOT DETECTED Final   Proteus species NOT DETECTED NOT DETECTED Final   Salmonella species NOT DETECTED NOT DETECTED Final   Serratia marcescens NOT DETECTED NOT DETECTED Final   Haemophilus influenzae NOT DETECTED NOT DETECTED Final   Neisseria meningitidis NOT DETECTED NOT DETECTED Final   Pseudomonas aeruginosa NOT DETECTED NOT DETECTED Final   Stenotrophomonas maltophilia NOT DETECTED NOT DETECTED Final    Candida albicans NOT DETECTED NOT DETECTED Final   Candida auris NOT DETECTED NOT DETECTED Final   Candida glabrata NOT DETECTED NOT DETECTED Final   Candida krusei NOT DETECTED NOT DETECTED Final   Candida parapsilosis NOT DETECTED NOT DETECTED Final   Candida tropicalis NOT DETECTED NOT DETECTED Final   Cryptococcus neoformans/gattii NOT DETECTED NOT DETECTED Final   Methicillin resistance mecA/C NOT DETECTED NOT DETECTED Final    Comment: Performed at [facility], [address] [place], [place] [address]  Culture, blood (Routine X 2) w Reflex to ID Panel     Status: None (Preliminary result)   Collection Time: 09/10/22 12:41 PM   Specimen: BLOOD  Result Value Ref Range Status   Specimen Description BLOOD  Final   Special Requests   Final    BOTTLES DRAWN AEROBIC ONLY Blood Culture adequate volume   Culture   Final    NO GROWTH 3 DAYS Performed at [place] [address], [place], [place] [address]    Report Status PENDING  Incomplete  Culture, blood (Routine X 2) w Reflex to ID Panel     Status: None (  Preliminary result)   Collection Time: 09/10/22  1:02 PM   Specimen: BLOOD  Result Value Ref Range Status   Specimen Description BLOOD BLOOD RIGHT HAND  Final   Special Requests   Final    BOTTLES DRAWN AEROBIC AND ANAEROBIC Blood Culture adequate volume   Culture   Final    NO GROWTH 3 DAYS Performed at [facility], [address], [place],  [address]    Report Status PENDING  Incomplete  Culture, BAL-quantitative w Gram Stain     Status: Abnormal (Preliminary result)   Collection Time: 09/13/22 12:01 PM   Specimen: Bronchoalveolar Lavage; Tracheal  Result Value Ref Range Status   Specimen Description BRONCHIAL ALVEOLAR LAVAGE  Final   Special Requests NONE  Final   Gram Stain   Final    RARE WBC PRESENT,BOTH PMN AND MONONUCLEAR NO ORGANISMS SEEN Performed at [facility], [place] [address] [place], [place] [address]    Culture  80,000 COLONIES/mL GRAM NEGATIVE RODS (A)  Final   Report Status PENDING  Incomplete   Pertinent Lab seen by me:    Latest Ref Rng & Units 09/14/2022    3:37 AM 09/14/2022    2:33 AM 09/13/2022    5:24 AM  CBC  WBC 4.0 - 10.5 K/uL 46.5   41.1   Hemoglobin 13.0 - 17.0 g/dL 13.0  12.6  12.7   Hematocrit 39.0 - 52.0 % 41.4  37.0  42.2   Platelets 150 - 400 K/uL 201   196       Latest Ref Rng & Units 09/14/2022    3:37 AM 09/14/2022    2:33 AM 09/13/2022    8:44 AM  CMP  Glucose 70 - 99 mg/dL 145   178   BUN 8 - 23 mg/dL 40   56   Creatinine 0.61 - 1.24 mg/dL 1.02   1.25   Sodium 135 - 145 mmol/L 153  154  150   Potassium 3.5 - 5.1 mmol/L 4.4  4.0  4.2   Chloride 98 - 111 mmol/L 119   114   CO2 22 - 32 mmol/L 26   28   Calcium 8.9 - 10.3 mg/dL 9.4   9.1      Pertinent Imagings/Other Imagings Plain films and CT images have been personally visualized and interpreted; radiology reports have been reviewed. Decision making incorporated into the Impression / Recommendations.  DG Abd Portable 1V  Result Date: 09/14/2022 CLINICAL DATA:  Encounter for feeding tube placement. EXAM: PORTABLE ABDOMEN - 1 VIEW COMPARISON:  KUB 09/13/2022 FINDINGS: New weighted tip enteric tube curls along the greater curvature of the stomach with the tip overlying the distal stomach. Nonobstructed upper abdominal bowel-gas pattern. The lung bases are clear. IMPRESSION: New weighted tip enteric tube curls along the greater curvature of the stomach with the tip overlying the distal stomach. Electronically Signed   By: Yvonne Kendall M.D.   On: 09/14/2022 09:48   DG Chest Port 1 View  Result Date: 09/13/2022 CLINICAL DATA:  Status post tracheostomy EXAM: PORTABLE CHEST 1 VIEW COMPARISON:  Chest radiograph 09/14/2019 FINDINGS: Interval tracheostomy. Low lung volumes. Overlying external material limits evaluation. Unchanged cardiac and mediastinal contours when accounting for differences in lung volumes.  Pneumothorax. New right basilar and left perihilar pulmonary opacities, nonspecific. IMPRESSION: 1. Interval tracheostomy. 2. New right basilar and left perihilar pulmonary opacities, nonspecific, may represent atelectasis, aspiration or infection. Electronically Signed   By: Marin Olp.D.  On: 09/13/2022 13:03   DG CHEST PORT 1 VIEW  Result Date: 09/13/2022 CLINICAL DATA:  History of CVA.  Intubated. EXAM: PORTABLE CHEST 1 VIEW COMPARISON:  09/12/2022 FINDINGS: The endotracheal tube is 7 cm above the carina. The NG tube is coursing down the esophagus and into the stomach. The cardiac silhouette, mediastinal and hilar contours are grossly normal given the AP projection portable technique. Persistent diffuse interstitial process likely asymmetric pulmonary edema. Persistent elevation of the left hemidiaphragm with overlying left basilar atelectasis small pleural effusion. IMPRESSION: 1. Support apparatus in good position without complicating features. 2. Persistent diffuse interstitial process likely asymmetric pulmonary edema. 3. Persistent left basilar atelectasis and small left effusion. Electronically Signed   By: Marijo Sanes M.D.   On: 09/13/2022 09:34   DG Abd Portable 1V  Result Date: 09/13/2022 CLINICAL DATA:  CVA, abdominal distension EXAM: PORTABLE ABDOMEN - 1 VIEW COMPARISON:  09/02/2022 abdominal radiograph FINDINGS: Enteric tube terminates in the proximal stomach. No disproportionately dilated small bowel loops. Mild colonic stool. No evidence of pneumatosis or pneumoperitoneum. No radiopaque nephrolithiasis. Midline inferior approach pelvic catheter terminates over the right symphysis pubis. IMPRESSION: Enteric tube terminates in the proximal stomach. Nonobstructive bowel gas pattern. Electronically Signed   By: Ilona Sorrel M.D.   On: 09/13/2022 09:30   DG CHEST PORT 1 VIEW  Result Date: 09/12/2022 CLINICAL DATA:  Shortness of breath EXAM: PORTABLE CHEST 1 VIEW COMPARISON:   Previous studies including the examination of 09/09/2022 FINDINGS: Transverse diameter of heart is increased. Tip of endotracheal tube is 5.5 cm above the carina. Enteric tube is noted traversing the esophagus. Central pulmonary vessels are prominent. There is blunting of left lateral CP angle suggesting small effusion. Increased markings are seen in both lower lung fields. There is no pneumothorax. IMPRESSION: Cardiomegaly. Central pulmonary vessels are prominent suggesting CHF. Increased markings in both lower lung fields may suggest interstitial pulmonary edema or atelectasis/pneumonia. Small left pleural effusion. Electronically Signed   By: Elmer Picker M.D.   On: 09/12/2022 10:23   DG CHEST PORT 1 VIEW  Result Date: 09/09/2022 CLINICAL DATA:  Desaturation EXAM: PORTABLE CHEST 1 VIEW COMPARISON:  09/06/2022, 09/05/2022 FINDINGS: Endotracheal tube tip is about 5.7 cm superior to carina. Esophageal tube tip below the diaphragm but incompletely visualized. Mild cardiomegaly with vascular congestion and suspected minimal edema. Suspect left pleural effusion with persistent airspace disease at left base. Hazy asymmetric density right thorax could be due to layering effusion or mild asymmetric edema. IMPRESSION: 1. Endotracheal tube tip about 5.7 cm superior to carina 2. Cardiomegaly with probable left effusion and persistent dense consolidation left base. Mild asymmetric hazy opacity right thorax could be due to layering effusion or mild asymmetric edema. Electronically Signed   By: Donavan Foil M.D.   On: 09/09/2022 16:36   DG CHEST PORT 1 VIEW  Result Date: 09/09/2022 CLINICAL DATA:  Hypoxia. EXAM: PORTABLE CHEST 1 VIEW COMPARISON:  09/06/2022 FINDINGS: 0535 hours. The cardio pericardial silhouette is enlarged. Left base collapse/consolidation with small pleural effusion again noted. Right lung clear. Endotracheal tube tip is 5.8 cm above the base of the carina. The NG tube passes into the stomach  although the distal tip position is not included on the film. Telemetry leads overlie the chest. IMPRESSION: 1. Endotracheal tube tip is 5.8 cm above the base of the carina. 2. Left base collapse/consolidation with small pleural effusion. No substantial change. Electronically Signed   By: Misty Stanley M.D.   On: 09/09/2022 06:32  CT HEAD WO CONTRAST (5MM)  Result Date: 09/08/2022 CLINICAL DATA:  Follow-up examination for stroke. EXAM: CT HEAD WITHOUT CONTRAST TECHNIQUE: Contiguous axial images were obtained from the base of the skull through the vertex without intravenous contrast. RADIATION DOSE REDUCTION: This exam was performed according to the departmental dose-optimization program which includes automated exposure control, adjustment of the mA and/or kV according to patient size and/or use of iterative reconstruction technique. COMPARISON:  Prior study from 09/02/2022. FINDINGS: Brain: No significant interval change in size and morphology of intraparenchymal hemorrhage centered at the right basal ganglia. Surrounding vasogenic edema has mildly increased from prior. Intraventricular extension with blood in the lateral ventricles, slightly decreased. Overall ventricular size is relatively similar without hydrocephalus. 5 mm of right-to-left shift is similar. No other new intracranial hemorrhage. Previously identified small infarcts better characterized on prior MRI. No other acute large vessel territory infarct. No mass lesion or extra-axial fluid collection. 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 No abnormal hyperdense vessel. Skull: Scalp soft tissues and calvarium demonstrate no new finding. Sinuses/Orbits: Globes orbital soft tissues within normal limits. Moderate mucosal thickening about the sphenoid ethmoidal and maxillary sinuses. Large bilateral mastoid effusions. Patient is suspected to be intubated. Other: None. IMPRESSION: 1. No significant interval change in size  and morphology of intraparenchymal hemorrhage centered at the right basal ganglia. Surrounding vasogenic edema has mildly increased from prior. 5 mm of right-to-left shift is similar. 2. Intraventricular extension with blood in the lateral ventricles, slightly decreased. Overall ventricular size is relatively similar without hydrocephalus. 3. Scattered small areas of acute ischemia involving the bilateral cerebral hemispheres, better characterized on recent MRI. 4. No other new acute intracranial abnormality. Electronically Signed   By: Jeannine Boga M.D.   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 He was intubated for airway protection. He appeared to have no movement on his left side and therefore code stroke was activated. CT scan demonstrated a large intraparenchymal hematoma on the right, with CT angiogram suggesting possibility of an internal carotid artery aneurysms. Patient therefore presents for further workup with diagnostic cerebral angiogram and possible aneurysm coiling. ACCESS: There was no family or friends available to provide informed consent for the procedure. We therefore proceeded with emergency consent in the patient's best interest. The patient was placed in the supine position on the angiography table and the skin of right groin prepped in the usual sterile fashion. The procedure was performed under general anesthesia. A 5-French sheath was introduced in the right common femoral artery using Seldinger technique. MEDICATIONS: HEPARIN: 0 Units total. CONTRAST:  26mL OMNIPAQUE IOHEXOL 300 MG/ML SOLN, 58mL OMNIPAQUE IOHEXOL 300 MG/ML SOLNcc, Omnipaque 300 FLUOROSCOPY TIME:  FLUOROSCOPY TIME: See IR records TECHNIQUE:  CATHETERS AND WIRES 5-French JB-1 catheter 180 cm 0.035" glidewire 6-French NeuronMax guide sheath 6-French Berenstein Select JB-1 catheter 0.058" CatV guidecatheter 150 cm Marksman microcatheter Synchro 2 soft microwire Excelsior XT-17 microcatheter COILS USED Target XL 360 3 mm x 9 cm Target Nano 1.5 mm x 3 cm VESSELS CATHETERIZED Right internal carotid Left internal carotid Right common femoral VESSELS STUDIED Right internal carotid, head Left internal carotid, head Right internal carotid artery, head (during embolization) Right internal carotid artery, head (immediate post-embolization) Right internal carotid artery, head (final control) Right common  femoral PROCEDURAL NARRATIVE A 5-Fr JB-1 glide catheter was advanced over a 0.035 glidewire into the aortic arch. The bilateral internal carotid arteries were then sequentially catheterized and cervical / cerebral angiograms taken. Attempts were made to gain access to the vertebral arteries unsuccessfully. After review of images, the catheter was removed without incident. The 5-Fr sheath was then exchanged over the glidewire for an 8-Fr sheath. Under real-time fluoroscopy, the guide sheath was advanced over the select catheter and glidewire into the descending aorta. The select catheter was then advanced into the proximal right internal carotid artery artery. The guide sheath was then advanced into the proximal cervical right internal carotid artery. The Select catheter was removed and the 058 guide catheter was coaxially introduced over the 27 microcatheter and microwire. The microcatheter was then advanced under roadmap guidance into the right middle cerebral artery. The guide catheter was then tracked over the microcatheter to its final position in the supraclinoid right internal carotid artery. The 27 microcatheter was then removed and the XT 17 catheter introduced over the microwire. The catheter was then navigated into the aneurysm lumen over the microwire.  The wire was then removed. The above coils were then sequentially deployed with progressive exclusion of the aneurysm. Cerebral angiography was performed immediately post embolization. The coiling catheter was then removed and the guide catheter withdrawn into the cervical internal carotid artery. Final control angiography was then performed from the guide catheter. The guide catheter and guide sheath were then synchronously removed without incident. FINDINGS: Right internal carotid, head: Injection reveals the presence of a widely patent ICA, M1, and A1 segments and their branches. 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 The aneurysm measures approximately 4.1 x 3.4 mm. The parenchymal and venous phases are normal. The venous sinuses are widely patent. Left internal carotid, head: Injection reveals the presence of a widely patent ICA, A1, and M1 segments and their branches. No aneurysms, AVMs, or high-flow fistulas are seen. The parenchymal and venous phases are normal. The venous sinuses are widely patent. Right internal carotid artery, head (during embolization): Angiograms taken during coiling reveal the presence of a widely patent ICA that leads to a patent ACA and MCA. Initial angiogram reveals a loop of coil which may be extending across the origin of the left A1. After placement of the coil, coil mass remains within the aneurysm without any coil prolapse noted. No filling defect to suggest thrombus are seen. Right internal carotid artery, head (immediate post-embolization): Injection reveals the presence of a widely patent ICA that leads to a patent ACA and MCA. Coil mass within the aneurysm is stable, without coil prolapse or filling defect to suggest thrombus. There is minimal residual filling at the neck, while the majority of the dome of the aneurysm  remains occluded. Right internal carotid artery, head (final control): Injection reveals the presence of a widely patent ICA that leads to a patent ACA and MCA. No thrombus is visualized. Coil mass is seen within the aneurysm and is in stable position. The parenchymal and venous phases are unremarkable. Right femoral: Normal vessel. No significant atherosclerotic disease. Arterial sheath in adequate position. DISPOSITION: Upon completion of the study, the femoral sheath was removed and hemostasis obtained using a 8 French Angio-Seal closure device. Good proximal and distal lower extremity pulses were documented upon achievement of hemostasis. The procedure was well tolerated and no early  complications were observed. The patient was transferred to the intensive care unit for further care. IMPRESSION: 1. 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 The preliminary results of this procedure were shared with the patient's family. 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 He was intubated for airway protection. He appeared to have no movement on his left side and therefore code stroke was activated. CT scan demonstrated a large intraparenchymal hematoma on the right, with CT angiogram suggesting possibility of an internal carotid artery aneurysms. Patient therefore presents for further workup with diagnostic cerebral angiogram and possible aneurysm coiling. ACCESS: There was no family or friends available to provide informed consent for the procedure. We therefore proceeded with  emergency consent in the patient's best interest. The patient was placed in the supine position on the angiography table and the skin of right groin prepped in the usual sterile fashion. The procedure was performed under general anesthesia. A 5-French sheath was introduced in the right common femoral artery using Seldinger technique. MEDICATIONS: HEPARIN: 0 Units total. CONTRAST:  27mL OMNIPAQUE IOHEXOL 300 MG/ML SOLN, 47mL OMNIPAQUE IOHEXOL 300 MG/ML SOLNcc, Omnipaque 300 FLUOROSCOPY TIME:  FLUOROSCOPY TIME: See IR records TECHNIQUE: CATHETERS AND WIRES 5-French JB-1 catheter 180 cm 0.035" glidewire 6-French NeuronMax guide sheath 6-French Berenstein Select JB-1 catheter 0.058" CatV guidecatheter 150 cm Marksman microcatheter Synchro 2 soft microwire Excelsior XT-17 microcatheter COILS USED Target XL 360 3 mm x 9 cm Target Nano 1.5 mm x 3 cm VESSELS CATHETERIZED Right internal carotid Left internal carotid Right common femoral VESSELS STUDIED Right internal carotid, head Left internal carotid, head Right internal carotid artery, head (during embolization) Right internal carotid artery, head (immediate post-embolization) Right internal carotid artery, head (final control) Right common femoral PROCEDURAL NARRATIVE A 5-Fr JB-1 glide catheter was advanced over a 0.035 glidewire into the aortic arch. The bilateral internal carotid arteries were then sequentially catheterized and cervical / cerebral angiograms taken. Attempts were made to gain access to the vertebral arteries unsuccessfully. After review of images, the catheter was removed without incident. The 5-Fr sheath was then exchanged over the glidewire for an 8-Fr sheath. Under real-time fluoroscopy, the guide sheath was advanced over the select catheter and glidewire into the descending aorta. The select catheter was then advanced into the proximal right internal carotid artery artery. The guide sheath was then advanced into the proximal cervical right internal  carotid artery. The Select catheter was removed and the 058 guide catheter was coaxially introduced over the 27 microcatheter and microwire. The microcatheter was then advanced under roadmap guidance into the right middle cerebral artery. The guide catheter was then tracked over the microcatheter to its final position in the supraclinoid right internal carotid artery. The 27 microcatheter was then removed and the XT 17 catheter introduced over the microwire. The catheter was then navigated into the aneurysm lumen over the microwire. The wire was then removed. The above coils were then sequentially deployed with progressive exclusion of the aneurysm. Cerebral angiography was performed immediately post embolization. The coiling catheter was then removed and the guide catheter withdrawn into the cervical internal carotid artery. Final control  angiography was then performed from the guide catheter. The guide catheter and guide sheath were then synchronously removed without incident. FINDINGS: Right internal carotid, head: Injection reveals the presence of a widely patent ICA, M1, and A1 segments and their branches. 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 The aneurysm measures approximately 4.1 x 3.4 mm. The parenchymal and venous phases are normal. The venous sinuses are widely patent. Left internal carotid, head: Injection reveals the presence of a widely patent ICA, A1, and M1 segments and their branches. No aneurysms, AVMs, or high-flow fistulas are seen. The parenchymal and venous phases are normal. The venous sinuses are widely patent. Right internal carotid artery, head (during embolization): Angiograms taken during coiling reveal the presence of a widely patent ICA that leads to a patent ACA and MCA. Initial angiogram reveals a loop of coil which may be extending  across the origin of the left A1. After placement of the coil, coil mass remains within the aneurysm without any coil prolapse noted. No filling defect to suggest thrombus are seen. Right internal carotid artery, head (immediate post-embolization): Injection reveals the presence of a widely patent ICA that leads to a patent ACA and MCA. Coil mass within the aneurysm is stable, without coil prolapse or filling defect to suggest thrombus. There is minimal residual filling at the neck, while the majority of the dome of the aneurysm remains occluded. Right internal carotid artery, head (final control): Injection reveals the presence of a widely patent ICA that leads to a patent ACA and MCA. No thrombus is visualized. Coil mass is seen within the aneurysm and is in stable position. The parenchymal and venous phases are unremarkable. Right femoral: Normal vessel. No significant atherosclerotic disease. Arterial sheath in adequate position. DISPOSITION: Upon completion of the study, the femoral sheath was removed and hemostasis obtained using a 8 French Angio-Seal closure device. Good proximal and distal lower extremity pulses were documented upon achievement of hemostasis. The procedure was well tolerated and no early complications were observed. The patient was transferred to the intensive care unit for further care. IMPRESSION: 1. 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 The preliminary results of this procedure were shared with the patient's family. Electronically Signed   By: Consuella Lose   On: 09/06/2022 14:10   VAS [place] TRANSCRANIAL DOPPLER  Result Date: 09/06/2022  Transcranial Doppler Patient Name:  Dustin Jones  Date of Exam:   09/05/2022 Medical Rec #: [id number]        Accession #:    [id number] Date of Birth: May 12, 1961        Patient Gender: M Patient Age:   70 years Exam Location:  [facility] Procedure:       VAS [place] TRANSCRANIAL DOPPLER Referring Phys: PRAMOD SETHI --------------------------------------------------------------------------------  Indications: ICH. Limitations: Ventilator, poor windows Limitations for diagnostic windows: Unable to insonate right transtemporal window. Unable to insonate left transtemporal window. Comparison Study: No prior study Performing Technologist: Sharion Dove RVS  Examination Guidelines: A complete evaluation includes B-mode imaging, spectral Doppler, color Doppler, and power Doppler as needed of all accessible portions of each vessel. Bilateral testing is considered an integral part of a complete examination. Limited examinations for reoccurring indications may be performed as noted.  +----------+-------------+----------+-----------+------------------+ RIGHT TCD Right VM (cm)Depth (cm)Pulsatility     Comment       +----------+-------------+----------+-----------+------------------+  MCA                                         unable to insonate +----------+-------------+----------+-----------+------------------+ ACA                                         unable to insonate +----------+-------------+----------+-----------+------------------+ Term ICA                                    unable to insonate +----------+-------------+----------+-----------+------------------+ PCA                                         unable to insonate +----------+-------------+----------+-----------+------------------+ Opthalmic     69.20                 2.08                       +----------+-------------+----------+-----------+------------------+ ICA siphon                                  unable to insonate +----------+-------------+----------+-----------+------------------+ Vertebral    -56.30                                            +----------+-------------+----------+-----------+------------------+   +----------+------------+----------+-----------+------------------+ LEFT TCD  Left VM (cm)Depth (cm)Pulsatility     Comment       +----------+------------+----------+-----------+------------------+ MCA                                        unable to insonate +----------+------------+----------+-----------+------------------+ ACA                                        unable to insonate +----------+------------+----------+-----------+------------------+ Term ICA                                   unable to insonate +----------+------------+----------+-----------+------------------+ PCA                                        unable to insonate +----------+------------+----------+-----------+------------------+ Opthalmic                                  unable to insonate +----------+------------+----------+-----------+------------------+ ICA siphon                                 unable to insonate +----------+------------+----------+-----------+------------------+ Vertebral                          -  49.4                      +----------+------------+----------+-----------+------------------+  +------------+-----+------------------+             VM cm     Comment       +------------+-----+------------------+ Prox Basilar     unable to insonate +------------+-----+------------------+ Dist Basilar     unable to insonate +------------+-----+------------------+ Summary:  Poor windows throughout greatly limit exam. antegrade flow in right opthalmic and bilateral vertebral arteries noted. *See table(s) above for TCD measurements and observations.  Diagnosing physician: Antony Contras MD Electronically signed by Antony Contras MD on 09/06/2022 at 8:13:43 AM.    Final    DG CHEST PORT 1 VIEW  Result Date: 09/06/2022 CLINICAL DATA:  Ventilator dependence. EXAM: PORTABLE CHEST 1 VIEW COMPARISON:  09/05/2022 FINDINGS: 0544 hours. Endotracheal tube tip is 5.8 cm above the  base of the carina. The NG tube passes into the stomach although the distal tip position is not included on the film. Low lung volumes with asymmetric elevation right hemidiaphragm. There is pulmonary vascular congestion without overt pulmonary edema. Left base collapse/consolidation is similar to prior with interval decrease in right base atelectasis. Small left pleural effusion suspected. Telemetry leads overlie the chest. IMPRESSION: 1. Interval decrease in right base atelectasis. Otherwise no substantial change in cardiopulmonary exam. 2. Endotracheal tube tip is 5.8 cm above the base of the carina. Electronically Signed   By: Misty Stanley M.D.   On: 09/06/2022 08:03   DG Chest Port 1 View  Result Date: 09/05/2022 CLINICAL DATA:  Respiratory failure. EXAM: PORTABLE CHEST 1 VIEW COMPARISON:  09/04/2022 FINDINGS: 0528 hours. Low volume film. The cardio pericardial silhouette is enlarged. Endotracheal tube tip is approximately 5.0 cm above the base of the carina. The NG tube passes into the stomach although the distal tip position is not included on the film. There is pulmonary vascular congestion with probable interstitial edema. Left greater than right basilar atelectasis again noted with probable small left effusion. Telemetry leads overlie the chest. IMPRESSION: 1. Low volume film with pulmonary vascular congestion and probable interstitial edema. 2. Left greater than right basilar atelectasis with probable small left effusion. Electronically Signed   By: Misty Stanley M.D.   On: 09/05/2022 06:26   DG Chest Port 1 View  Result Date: 09/04/2022 CLINICAL DATA:  Respiratory failure EXAM: PORTABLE CHEST 1 VIEW COMPARISON:  09/02/2022 FINDINGS: Endotracheal tube 5 cm from carina. NG tube extends the stomach. LEFT basilar atelectasis and small effusions similar prior. No pneumothorax. No pulmonary edema IMPRESSION: 1. No significant change. 2. Stable support apparatus. 3. LEFT basilar atelectasis and  effusion. Electronically Signed   By: Suzy Bouchard M.D.   On: 09/04/2022 09:30   DG Abd Portable 1V  Result Date: 09/02/2022 CLINICAL DATA:  Intracranial haemorrhage, code stroke. Encounter for NG tube placement. EXAM: PORTABLE ABDOMEN - 1 VIEW COMPARISON:  08/27/2022. FINDINGS: The bowel gas pattern is normal. An enteric tube terminates in the stomach. No radio-opaque calculi or other significant radiographic abnormality are seen. IMPRESSION: Enteric tube terminates in the stomach. Electronically Signed   By: Brett Fairy M.D.   On: 09/02/2022 20:24   DG CHEST PORT 1 VIEW  Result Date: 09/02/2022 CLINICAL DATA:  Status post intubation. EXAM: PORTABLE CHEST 1 VIEW COMPARISON:  September 01, 2022 FINDINGS: An endotracheal tube is seen with its distal tip approximately 4.6 cm from the carina. The cardiac silhouette is mildly enlarged and unchanged in  size. There is mild prominence of the bilateral suprahilar and bilateral infrahilar pulmonary vasculature. Mild to moderate severity atelectasis and/or infiltrate is seen within the left lung base. This is decreased in severity when compared to the prior study with improved aeration seen throughout the remainder of the left lung. There is no evidence of a pleural effusion or pneumothorax. The visualized skeletal structures are unremarkable. IMPRESSION: 1. Endotracheal tube positioning, as described above. 2. Mild to moderate severity left basilar atelectasis and/or infiltrate with improved aeration of the left lung compared to the prior study. Electronically Signed   By: Virgina Norfolk M.D.   On: 09/02/2022 19:34   MR BRAIN W WO CONTRAST  Result Date: 09/02/2022 CLINICAL DATA:  Hemorrhagic stroke EXAM: MRI HEAD WITHOUT AND WITH CONTRAST TECHNIQUE: Multiplanar, multiecho pulse sequences of the brain and surrounding structures were obtained without and with intravenous contrast. CONTRAST:  10 mL Vueway COMPARISON:  No prior MRI, correlation is made with  CT head 08/26/2022 FINDINGS: Brain: Large acute intraparenchymal hemorrhage centered in the right lentiform nucleus, measuring approximately 6.6 x 2.2 x 3.3 cm (AP x TR x CC) (estimated volume 24 mL), overall similar to the prior CT. Small amount of surrounding T2 hyperintense signal, likely edema. Redemonstrated extension into the right-greater-than-left lateral ventricle, third ventricle, and fourth ventricle. Mild mass effect and 5 mm of right-to-left midline shift. Small area of restricted diffusion in the right occipital lobe (series 5, image 81), with ADC correlate (series 6, image 27), likely acute infarct. Additional smaller foci of acute infarct are noted in the right parietal lobe (series 5, image 91 and 97), left anterior frontal lobe (series 5, image 83), right anterior and inferior frontal lobe (series 5, image 94 and 82), and left occipital lobe (series 5, image 83). No hydrocephalus. Vascular: Normal arterial flow voids. Susceptibility is seen in the location of the previously noted right ICA terminus aneurysm, possibly related to coiling. Otherwise normal arterial and venous enhancement. Skull and upper cervical spine: Normal marrow signal. Sinuses/Orbits: Mild mucosal thickening in the maxillary sinuses. The orbits are unremarkable. Other: Trace fluid in the mastoid air cells. IMPRESSION: 1. Large acute intraparenchymal hemorrhage centered in the right lentiform nucleus, with extension into the right-greater-than-left lateral ventricle, third ventricle, and fourth ventricle, overall similar to the prior CT. Mild mass effect and 5 mm of right-to-left midline shift. No hydrocephalus. 2. Small areas of acute infarct in the bilateral occipital lobes, bilateral frontal lobes, and right parietal lobe. These results will be called to the ordering clinician or representative by the Radiologist Assistant, and communication documented in the PACS or [employer]. Electronically Signed   By: Merilyn Baba  M.D.   On: 09/02/2022 04:02   DG Abd 1 View  Result Date: 08/09/2022 CLINICAL DATA:  Screening for metal prior to MRI. EXAM: ABDOMEN - 1 VIEW COMPARISON:  None Available. FINDINGS: Rectal temperature probe in place. NG tube in the stomach. No additional radiopaque foreign body. Nonobstructive bowel gas pattern. No free air or organomegaly. IMPRESSION: NG tube in the stomach.  Rectal temperature probe in place. No acute findings. Electronically Signed   By: Rolm Baptise M.D.   On: 08/09/2022 20:15   CT HEAD WO CONTRAST  Result Date: 08/18/2022 CLINICAL DATA:  Stroke, hemorrhagic Neuro deficit, acute, stroke suspected EXAM: CT HEAD WITHOUT CONTRAST TECHNIQUE: Contiguous axial images were obtained from the base of the skull through the vertex without intravenous contrast. RADIATION DOSE REDUCTION: This exam was performed according to the departmental  dose-optimization program which includes automated exposure control, adjustment of the mA and/or kV according to patient size and/or use of iterative reconstruction technique. COMPARISON:  CT head from the same day. FINDINGS: Brain: Unchanged size of the acute intraparenchymal hemorrhage centered in the right lentiform nucleus with mildly increased intraventricular extension. Similar ventricular size and mass effect. No evidence of acute large vascular territory infarct or mass lesion. Vascular: No hyperdense vessel identified. Skull: No acute fracture. Sinuses/Orbits: Clear sinuses.  No acute orbital findings. Other: No mastoid effusions. IMPRESSION: Unchanged size of the acute intraparenchymal hemorrhage centered in the right lentiform nucleus with mildly increased intraventricular extension. Similar ventricular size and mass effect. Electronically Signed   By: Margaretha Sheffield M.D.   On: 08/10/2022 16:27   ECHOCARDIOGRAM COMPLETE  Result Date: 08/11/2022    ECHOCARDIOGRAM REPORT   Patient Name:   Dustin Jones Jones Date of Exam: 09/03/2022 Medical Rec #:   [id number]       Height:       67.0 in Accession #:    [id number]      Weight:       220.5 lb Date of Birth:  12/05/1875      BSA:          2.108 m Patient Age:    89 years       BP:           118/72 mmHg Patient Gender: M               HR:           69 bpm. Exam Location:  Inpatient Procedure: 2D Echo, Cardiac Doppler and Color Doppler Indications:    Stroke  History:        Patient has no prior history of Echocardiogram examinations.  Sonographer:    Memory Argue Referring PhysPX:[id number] [place]  1. Left ventricular ejection fraction, by estimation, is 60 to 65%. The left ventricle has normal function. The left ventricle has no regional wall motion abnormalities. There is mild left ventricular hypertrophy. Left ventricular diastolic parameters are consistent with Grade I diastolic dysfunction (impaired relaxation).  2. Right ventricular systolic function is normal. The right ventricular size is normal.  3. The mitral valve is grossly normal. No evidence of mitral valve regurgitation.  4. The aortic valve is grossly normal. Aortic valve regurgitation is not visualized.  5. IVC is not well visualized. Conclusion(s)/Recommendation(s): Normal biventricular function without evidence of hemodynamically significant valvular heart disease. FINDINGS  Left Ventricle: Left ventricular ejection fraction, by estimation, is 60 to 65%. The left ventricle has normal function. The left ventricle has no regional wall motion abnormalities. The left ventricular internal cavity size was normal in size. There is  mild left ventricular hypertrophy. Left ventricular diastolic parameters are consistent with Grade I diastolic dysfunction (impaired relaxation). Right Ventricle: The right ventricular size is normal. Right ventricular systolic function is normal. Left Atrium: Left atrial size was normal in size. Right Atrium: Right atrial size was normal in size. Pericardium: There is no evidence of pericardial  effusion. Mitral Valve: The mitral valve is grossly normal. No evidence of mitral valve regurgitation. Tricuspid Valve: Tricuspid valve regurgitation is not demonstrated. Aortic Valve: The aortic valve is grossly normal. Aortic valve regurgitation is not visualized. Aortic valve mean gradient measures 6.0 mmHg. Aortic valve peak gradient measures 11.6 mmHg. Aortic valve area, by VTI measures 2.40 cm. Pulmonic Valve: Pulmonic valve regurgitation is not visualized. Aorta: The aortic root and ascending aorta  are structurally normal, with no evidence of dilitation. Venous: IVC is not well visualized. IAS/Shunts: The interatrial septum was not well visualized.  LEFT VENTRICLE PLAX 2D LVIDd:         4.70 cm   Diastology LVIDs:         3.00 cm   LV e' medial:    5.55 cm/s LV PW:         1.30 cm   LV E/e' medial:  12.4 LV IVS:        1.20 cm   LV e' lateral:   4.79 cm/s LVOT diam:     1.90 cm   LV E/e' lateral: 14.4 LV SV:         69 LV SV Index:   33 LVOT Area:     2.84 cm  RIGHT VENTRICLE TAPSE (M-mode): 1.9 cm LEFT ATRIUM             Index        RIGHT ATRIUM           Index LA diam:        3.10 cm 1.47 cm/m   RA Area:     13.00 cm LA Vol (A2C):   57.4 ml 27.23 ml/m  RA Volume:   29.90 ml  14.19 ml/m LA Vol (A4C):   82.5 ml 39.14 ml/m LA Biplane Vol: 81.4 ml 38.62 ml/m  AORTIC VALVE AV Area (Vmax):    2.54 cm AV Area (Vmean):   2.36 cm AV Area (VTI):     2.40 cm AV Vmax:           170.00 cm/s AV Vmean:          111.000 cm/s AV VTI:            0.287 m AV Peak Grad:      11.6 mmHg AV Mean Grad:      6.0 mmHg LVOT Vmax:         152.00 cm/s LVOT Vmean:        92.500 cm/s LVOT VTI:          0.243 m LVOT/AV VTI ratio: 0.85  AORTA Ao Root diam: 3.50 cm MITRAL VALVE MV Area (PHT): 2.87 cm    SHUNTS MV Decel Time: 264 msec    Systemic VTI:  0.24 m MV E velocity: 68.80 cm/s  Systemic Diam: 1.90 cm MV A velocity: 87.40 cm/s MV E/A ratio:  0.79 Landscape architect signed by Phineas Inches Signature Date/Time:  08/05/2022/12:07:27 PM    Final    IR Angiogram Follow Up Study  RESULT     PROCEDURE: DIAGNOSTIC CEREBRAL ANGIOGRAM 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 He was intubated for airway protection. He appeared to have no movement on his left side and therefore code stroke was activated. CT scan demonstrated a large intraparenchymal hematoma on the right, with CT angiogram suggesting possibility of an internal carotid artery aneurysms. Patient therefore presents for further workup with diagnostic cerebral angiogram and possible aneurysm coiling. ACCESS: There was no family or friends available to provide informed consent for the procedure. We therefore proceeded with emergency consent in the patient's best interest. The patient was placed in the supine position on the angiography table and the skin of right groin prepped in the usual sterile fashion. The procedure was performed under general anesthesia. A 5-French  sheath was introduced in the right common femoral artery using Seldinger technique. MEDICATIONS: HEPARIN: 0 Units total. CONTRAST:  71mL OMNIPAQUE IOHEXOL 300 MG/ML SOLN, 11mL OMNIPAQUE IOHEXOL 300 MG/ML SOLNcc, Omnipaque 300 FLUOROSCOPY TIME:  FLUOROSCOPY TIME: See IR records TECHNIQUE: CATHETERS AND WIRES 5-French JB-1 catheter 180 cm 0.035" glidewire 6-French NeuronMax guide sheath 6-French Berenstein Select JB-1 catheter 0.058" CatV guidecatheter 150 cm Marksman microcatheter Synchro 2 soft microwire Excelsior XT-17 microcatheter COILS USED Target XL 360 3 mm x 9 cm Target Nano 1.5 mm x 3 cm VESSELS CATHETERIZED Right internal carotid Left internal carotid Right common femoral VESSELS STUDIED Right internal carotid, head Left internal carotid, head Right internal carotid artery, head (during embolization) Right internal carotid artery, head  (immediate post-embolization) Right internal carotid artery, head (final control) Right common femoral PROCEDURAL NARRATIVE A 5-Fr JB-1 glide catheter was advanced over a 0.035 glidewire into the aortic arch. The bilateral internal carotid arteries were then sequentially catheterized and cervical / cerebral angiograms taken. Attempts were made to gain access to the vertebral arteries unsuccessfully. After review of images, the catheter was removed without incident. The 5-Fr sheath was then exchanged over the glidewire for an 8-Fr sheath. Under real-time fluoroscopy, the guide sheath was advanced over the select catheter and glidewire into the descending aorta. The select catheter was then advanced into the proximal right internal carotid artery artery. The guide sheath was then advanced into the proximal cervical right internal carotid artery. The Select catheter was removed and the 058 guide catheter was coaxially introduced over the 27 microcatheter and microwire. The microcatheter was then advanced under roadmap guidance into the right middle cerebral artery. The guide catheter was then tracked over the microcatheter to its final position in the supraclinoid right internal carotid artery. The 27 microcatheter was then removed and the XT 17 catheter introduced over the microwire. The catheter was then navigated into the aneurysm lumen over the microwire. The wire was then removed. The above coils were then sequentially deployed with progressive exclusion of the aneurysm. Cerebral angiography was performed immediately post embolization. The coiling catheter was then removed and the guide catheter withdrawn into the cervical internal carotid artery. Final control angiography was then performed from the guide catheter. The guide catheter and guide sheath were then synchronously removed without incident. FINDINGS: Right internal carotid, head: Injection reveals the presence of a widely patent ICA, M1, and A1 segments  and their branches. 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 The aneurysm measures approximately 4.1 x 3.4 mm. The parenchymal and venous phases are normal. The venous sinuses are widely patent. Left internal carotid, head: Injection reveals the presence of a widely patent ICA, A1, and M1 segments and their branches. No aneurysms, AVMs, or high-flow fistulas are seen. The parenchymal and venous phases are normal. The venous sinuses are widely patent. Right internal carotid artery, head (during embolization): Angiograms taken during coiling reveal the presence of a widely patent ICA that leads to a patent ACA and MCA. Initial angiogram reveals a loop of coil which may be extending across the origin of the left A1. After placement of the coil, coil mass remains within the aneurysm without any coil prolapse noted. No filling defect to suggest thrombus are seen. Right internal carotid artery, head (immediate post-embolization): Injection reveals the presence of a widely patent ICA that leads to a patent ACA and MCA. Coil  mass within the aneurysm is stable, without coil prolapse or filling defect to suggest thrombus. There is minimal residual filling at the neck, while the majority of the dome of the aneurysm remains occluded. Right internal carotid artery, head (final control): Injection reveals the presence of a widely patent ICA that leads to a patent ACA and MCA. No thrombus is visualized. Coil mass is seen within the aneurysm and is in stable position. The parenchymal and venous phases are unremarkable. Right femoral: Normal vessel. No significant atherosclerotic disease. Arterial sheath in adequate position. DISPOSITION: Upon completion of the study, the femoral sheath was removed and hemostasis obtained using a 8 French Angio-Seal closure device. Good proximal and  distal lower extremity pulses were documented upon achievement of hemostasis. The procedure was well tolerated and no early complications were observed. The patient was transferred to the intensive care unit for further care. IMPRESSION: 1. 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 The preliminary results of this procedure were shared with the patient's family. 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 He was intubated for airway protection. He appeared to have no movement on his left side and therefore code stroke was activated. CT scan demonstrated a large intraparenchymal hematoma on the right, with CT angiogram suggesting possibility of an internal carotid artery aneurysms. Patient therefore presents for further workup with diagnostic cerebral angiogram and possible aneurysm coiling. ACCESS: There was no family or friends available to provide informed consent for the procedure. We therefore proceeded with emergency consent in the patient's best interest. The patient was placed in the supine position on the angiography table and the skin of right groin prepped in the usual sterile fashion. The procedure was performed under general anesthesia. A 5-French sheath was introduced in the right common femoral artery using Seldinger technique. MEDICATIONS: HEPARIN: 0 Units total. CONTRAST:  18mL OMNIPAQUE IOHEXOL 300 MG/ML SOLN, 21mL OMNIPAQUE IOHEXOL 300 MG/ML SOLNcc, Omnipaque 300 FLUOROSCOPY TIME:  FLUOROSCOPY TIME: See IR records TECHNIQUE: CATHETERS AND WIRES 5-French JB-1 catheter 180 cm 0.035" glidewire 6-French  NeuronMax guide sheath 6-French Berenstein Select JB-1 catheter 0.058" CatV guidecatheter 150 cm Marksman microcatheter Synchro 2 soft microwire Excelsior XT-17 microcatheter COILS USED Target XL 360 3 mm x 9 cm Target Nano 1.5 mm x 3 cm VESSELS CATHETERIZED Right internal carotid Left internal carotid Right common femoral VESSELS STUDIED Right internal carotid, head Left internal carotid, head Right internal carotid artery, head (during embolization) Right internal carotid artery, head (immediate post-embolization) Right internal carotid artery, head (final control) Right common femoral PROCEDURAL NARRATIVE A 5-Fr JB-1 glide catheter was advanced over a 0.035 glidewire into the aortic arch. The bilateral internal carotid arteries were then sequentially catheterized and cervical / cerebral angiograms taken. Attempts were made to gain access to the vertebral arteries unsuccessfully. After review of images, the catheter was removed without incident. The 5-Fr sheath was then exchanged over the glidewire for an 8-Fr sheath. Under real-time fluoroscopy, the guide sheath was advanced over the select catheter and glidewire into the descending aorta. The select catheter was then advanced into the proximal right internal carotid artery artery. The guide sheath was then  advanced into the proximal cervical right internal carotid artery. The Select catheter was removed and the 058 guide catheter was coaxially introduced over the 27 microcatheter and microwire. The microcatheter was then advanced under roadmap guidance into the right middle cerebral artery. The guide catheter was then tracked over the microcatheter to its final position in the supraclinoid right internal carotid artery. The 27 microcatheter was then removed and the XT 17 catheter introduced over the microwire. The catheter was then navigated into the aneurysm lumen over the microwire. The wire was then removed. The above coils were then sequentially deployed  with progressive exclusion of the aneurysm. Cerebral angiography was performed immediately post embolization. The coiling catheter was then removed and the guide catheter withdrawn into the cervical internal carotid artery. Final control angiography was then performed from the guide catheter. The guide catheter and guide sheath were then synchronously removed without incident. FINDINGS: Right internal carotid, head: Injection reveals the presence of a widely patent ICA, M1, and A1 segments and their branches. 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 The aneurysm measures approximately 4.1 x 3.4 mm. The parenchymal and venous phases are normal. The venous sinuses are widely patent. Left internal carotid, head: Injection reveals the presence of a widely patent ICA, A1, and M1 segments and their branches. No aneurysms, AVMs, or high-flow fistulas are seen. The parenchymal and venous phases are normal. The venous sinuses are widely patent. Right internal carotid artery, head (during embolization): Angiograms taken during coiling reveal the presence of a widely patent ICA that leads to a patent ACA and MCA. Initial angiogram reveals a loop of coil which may be extending across the origin of the left A1. After placement of the coil, coil mass remains within the aneurysm without any coil prolapse noted. No filling defect to suggest thrombus are seen. Right internal carotid artery, head (immediate post-embolization): Injection reveals the presence of a widely patent ICA that leads to a patent ACA and MCA. Coil mass within the aneurysm is stable, without coil prolapse or filling defect to suggest thrombus. There is minimal residual filling at the neck, while the majority of the dome of the aneurysm remains occluded. Right internal carotid artery, head (final control):  Injection reveals the presence of a widely patent ICA that leads to a patent ACA and MCA. No thrombus is visualized. Coil mass is seen within the aneurysm and is in stable position. The parenchymal and venous phases are unremarkable. Right femoral: Normal vessel. No significant atherosclerotic disease. Arterial sheath in adequate position. DISPOSITION: Upon completion of the study, the femoral sheath was removed and hemostasis obtained using a 8 French Angio-Seal closure device. Good proximal and distal lower extremity pulses were documented upon achievement of hemostasis. The procedure was well tolerated and no early complications were observed. The patient was transferred to the intensive care unit for further care. IMPRESSION: 1. 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 The preliminary results of this procedure were shared with the patient's family. Electronically Signed   By: Consuella Lose   On: 09/06/2022 14:10   DG Chest Port 1 View  Result Date: 09/02/2022 CLINICAL DATA:  Male of unknown age code stroke presentation. Right lentiform hemorrhage with intraventricular extension on head CT. Intubated. Blunt trauma. EXAM: PORTABLE CHEST 1 VIEW COMPARISON:  CTA head and neck today  reported separately. FINDINGS: Portable AP semi upright view at 0948 hours. Satisfactory endotracheal tube tip between the clavicles and carina. Enteric tube courses to the abdomen and appears to loop in the stomach, left upper quadrant. Low lung volumes. Mediastinal contours are within normal limits. Patchy and confluent left lung, perihilar opacity. Dependent right upper lung opacity better demonstrated by CTA today. No pneumothorax or definite effusion. Paucity of bowel gas in the upper abdomen. No acute osseous abnormality identified. IMPRESSION: 1. Satisfactory ET tube and enteric tube. 2. Patchy and confluent left lung opacity, with  dependent right upper lung opacity better demonstrated by CTA today. Consider aspiration and/or pneumonia. No pneumothorax or definite effusion. Electronically Signed   By: Genevie Ann M.D.   On: 08/05/2022 10:03   CT C-SPINE NO CHARGE  Result Date: 08/11/2022 CLINICAL DATA:  Male of unknown age code stroke presentation. Right lentiform hemorrhage with intraventricular extension on head CT. Intubated. Blunt trauma. EXAM: CT CERVICAL SPINE WITH CONTRAST TECHNIQUE: Multiplanar CT images of the cervical spine were reconstructed from contemporary CTA of the Neck. RADIATION DOSE REDUCTION: This exam was performed according to the departmental dose-optimization program which includes automated exposure control, adjustment of the mA and/or kV according to patient size and/or use of iterative reconstruction technique. CONTRAST:  No additional COMPARISON:  CT Head, CT face, and CTA Head and Neck today. FINDINGS: Alignment: Reversal of cervical lordosis. Cervicothoracic junction alignment is within normal limits. Bilateral posterior element alignment is within normal limits. Skull base and vertebrae: Visualized skull base is intact. No atlanto-occipital dissociation. C1 and C2 appear intact and aligned. No acute osseous abnormality identified. Soft tissues and spinal canal: No prevertebral fluid or swelling. No visible canal hematoma. Note bilateral cervical lymphadenopathy, but other face and neck soft tissues are reported separately Disc levels: Congenital incomplete segmentation of C3-C4. Chronic severe disc and endplate degeneration [id number] through T1-T2. Associated bulky anterior endplate osteophytes, with subsequent interbody ankylosis of C7-T1. Possible developing ankylosis at C6-C7. Mild associated lower cervical spinal stenosis suspected. Upper chest: Reported separately. CT Head and CTA Head and Neck are reported separately. IMPRESSION: 1. No acute traumatic injury identified in the cervical spine. 2. CT Head and CTA  Head and Neck are reported separately. 3. Nonspecific cervical Lymphadenopathy, see Face CT reported separately. 4. 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 Mild lower cervical spinal stenosis suspected. Electronically Signed   By: Genevie Ann M.D.   On: 08/13/2022 10:01   CT MAXILLOFACIAL W CONTRAST  Result Date: 08/20/2022 CLINICAL DATA:  Male of unknown age code stroke presentation. Right lentiform hemorrhage with intraventricular extension on head CT. Intubated. Blunt trauma. EXAM: CT MAXILLOFACIAL WITH CONTRAST TECHNIQUE: Multidetector CT imaging of the maxillofacial structures was performed with intravenous contrast. Multiplanar CT image reconstructions were also generated. RADIATION DOSE REDUCTION: This exam was performed according to the departmental dose-optimization program which includes automated exposure control, adjustment of the mA and/or kV according to patient size and/or use of iterative reconstruction technique. CONTRAST:  89mL OMNIPAQUE IOHEXOL 350 MG/ML SOLN COMPARISON:  Head CT, cervical spine CT, CTA head and neck today reported separately. FINDINGS: Osseous: Widespread carious dentition. Mandible intact. TMJ alignment within normal limits. No acute maxilla, zygoma, pterygoid, or nasal bone fracture. Central skull base appears intact. Cervical spine is detailed separately. Orbits: Intact orbital walls. Orbits soft tissues appear symmetric and negative. Sinuses: Paranasal sinuses and mastoids are well aerated. Tympanic cavities  are clear. Soft tissues: Intubated. Fluid in the pharynx. Nonspecific adenoid hypertrophy. Retropharyngeal course of the carotid arteries. Parapharyngeal, sublingual, submandibular, masticator, and parotid spaces appear normal. Nonspecific bilateral cervical lymphadenopathy, with individual nodes up to 18 mm short axis (frequent  bilateral cervical nodes of 14-15 mm short axis). No cystic or necrotic nodes. Major vascular structures are reported with the CTA separately. Limited intracranial: Reported separately. IMPRESSION: 1. No acute traumatic injury identified in the Face. 2. Nonspecific bilateral cervical lymphadenopathy. Consider an acute Lymphoproliferative disorder. HIV can have a similar appearance. Metastatic disease felt less likely. 3. Poor dentition. 4. CT Head and CTA Head and Neck are reported separately. Electronically Signed   By: Genevie Ann M.D.   On: 08/23/2022 09:57   CT ANGIO HEAD NECK W WO CM (CODE STROKE)  Result Date: 08/16/2022 CLINICAL DATA:  Neuro deficit, acute, stroke suspected EXAM: CT ANGIOGRAPHY HEAD AND NECK TECHNIQUE: Multidetector CT imaging of the head and neck was performed using the standard protocol during bolus administration of intravenous contrast. Multiplanar CT image reconstructions and MIPs were obtained to evaluate the vascular anatomy. Carotid stenosis measurements (when applicable) are obtained utilizing NASCET criteria, using the distal internal carotid diameter as the denominator. RADIATION DOSE REDUCTION: This exam was performed according to the departmental dose-optimization program which includes automated exposure control, adjustment of the mA and/or kV according to patient size and/or use of iterative reconstruction technique. COMPARISON:  None Available. FINDINGS: CTA NECK FINDINGS Aortic arch: Great vessel origins are patent. Right carotid system: Limited assessment due to poor contrast bolus timing with out evidence of greater than 50% stenosis. Left carotid system: Limited assessment due to poor contrast bolus timing with out evidence of greater than 50% stenosis. Vertebral arteries: Very limited evaluation due to poor contrast bolus timing. Vertebral arteries appear patent. Skeleton: Multilevel degenerative change. Other neck: No acute findings. Upper chest: Opacities in bilateral  upper lobes. Review of the MIP images confirms the above findings CTA HEAD FINDINGS Anterior circulation: Lobulated 5 mm outpouching arising from the supraclinoid right ICA (series 10, image 103 and series 9, image 98 Bilateral intracranial ICAs, MCAs and ACAs are patent without visible high-grade stenosis. Parotid mild-to-moderate bilateral intracranial ICA stenosis. Posterior circulation: Bilateral intradural vertebral arteries, basilar artery and posterior cerebral arteries are patent. Limited assessment for stenosis due to poor contrast bolus timing. Venous sinuses: As permitted by contrast timing, patent. Review of the MIP images confirms the above findings IMPRESSION: 1. Limited study due to poor contrast bolus timing. There appears to be a lobulated 5 mm outpouching arising from the supraclinoid right ICA, adjacent to the acute intraparenchymal hemorrhage and suspicious for ruptured aneurysm. Catheter arteriogram could better assess. 2. Dependent opacities in bilateral upper lobes, probably aspiration. Findings discussed with Dr. Lorrin Goodell via telephone 9:51 a.m. Electronically Signed   By: Margaretha Sheffield M.D.   On: 09/03/2022 09:55   CT HEAD CODE STROKE WO CONTRAST  Result Date: 08/29/2022 CLINICAL DATA:  Code stroke.  Neuro deficit, acute, stroke suspected EXAM: CT HEAD WITHOUT CONTRAST TECHNIQUE: Contiguous axial images were obtained from the base of the skull through the vertex without intravenous contrast. RADIATION DOSE REDUCTION: This exam was performed according to the departmental dose-optimization program which includes automated exposure control, adjustment of the mA and/or kV according to patient size and/or use of iterative reconstruction technique. COMPARISON:  None Available. FINDINGS: Brain: Large acute hemorrhage in the right lentiform nucleus with intraventricular extension to the lateral ventricles, third ventricle, and fourth ventricle. Acute  hemorrhage measures up to approximately  7.0 x 2.3 x 3.6 cm (estimated volume of 29 mL). There may be a small amount of subdural hemorrhage along the falx. Slight rounding of the temporal horns. Approximately 7 mm of leftward midline shift at the foramen of Monro. Mild effacement of the suprasellar cistern and sulcal effacement. No evidence of acute large vascular territory infarct or mass lesion. Vascular: No hyperdense vessel identified. Skull: No acute fracture. Sinuses/Orbits: Clear visualized sinuses. No acute orbital findings. IMPRESSION: 1. Large acute hemorrhage in the right lentiform nucleus with intraventricular extension. Approximately 7 mm of leftward midline shift. 2. Slight rounding of the temporal horns, which could represent early hydrocephalus. Recommend attention on follow-up. Findings discussed with Dr. Oneal Grout via telephone at 9:30 a.m. Electronically Signed   By: Margaretha Sheffield M.D.   On: 08/23/2022 09:33      I spent 100 minutes for this patient encounter including review of prior medical records/discussing diagnostics and treatment plan with the patient/family/coordinate care with primary/other specialits with greater than 50% of time in face to face encounter.   Electronically signed by:   Rosiland Oz, MD Infectious Disease Physician [facility] Pager: [phone]

## 2022-09-14 NOTE — Progress Notes (Signed)
eLink Physician-Brief Progress Note Patient Name: Dustin Jones DOB: 1961-03-11 MRN: [id number]   Date of Service  09/14/2022  HPI/Events of Note  Patient's blood pressure is intermittently greater than goal of SBP < 160 mmHg despite maximum infusion rate of Cleviprex.  eICU Interventions  PRN iv Labetalol 10 mg Q 4 hours added for SBP > 160 while on maximum Cleviprex gtt rate, Labetalol to be held for heart rate < 55.        Kerry Kass Mischell [place] 09/14/2022, 5:15 AM

## 2022-09-14 NOTE — Progress Notes (Addendum)
RT at bedside to find pt in PS/CPAP mode 5/5. Pt tolerating well at this time with SVS. RT will continue to monitor pt.

## 2022-09-14 NOTE — Progress Notes (Signed)
eLink Physician-Brief Progress Note Patient Name: Dustin Jones DOB: Oct 11, 1961 MRN: [id number]   Date of Service  09/14/2022  HPI/Events of Note  Labetalol 10 mg dose did not bring SBP under 140 as desired by neurology.  eICU Interventions  Labetalol dose changed to 20 mg with a dose to be given now.        Kerry Kass Amon [place] 09/14/2022, 6:31 AM

## 2022-09-15 DIAGNOSIS — R509 Fever, unspecified: Secondary | ICD-10-CM

## 2022-09-15 DIAGNOSIS — D72829 Elevated white blood cell count, unspecified: Secondary | ICD-10-CM

## 2022-09-15 DIAGNOSIS — J95851 Ventilator associated pneumonia: Secondary | ICD-10-CM

## 2022-09-15 LAB — BASIC METABOLIC PANEL
Anion gap: 12 (ref 5–15)
BUN: 35 mg/dL — ABNORMAL HIGH (ref 8–23)
CO2: 23 mmol/L (ref 22–32)
Calcium: 9.4 mg/dL (ref 8.9–10.3)
Chloride: 114 mmol/L — ABNORMAL HIGH (ref 98–111)
Creatinine, Ser: 0.8 mg/dL (ref 0.61–1.24)
GFR, Estimated: >60 mL/min (ref >60)
Glucose, Bld: 191 mg/dL — ABNORMAL HIGH (ref 70–99)
Potassium: 3.6 mmol/L (ref 3.5–5.1)
Sodium: 149 mmol/L — ABNORMAL HIGH (ref 135–145)

## 2022-09-15 LAB — CBC WITH DIFFERENTIAL/PLATELET
Abs Immature Granulocytes: 0 10*3/uL (ref 0.00–0.07)
Basophils Absolute: 0 10*3/uL (ref 0.0–0.1)
Basophils Relative: 0 %
Eosinophils Absolute: 0 10*3/uL (ref 0.0–0.5)
Eosinophils Relative: 0 %
HCT: 38.7 % — ABNORMAL LOW (ref 39.0–52.0)
Hemoglobin: 12.5 g/dL — ABNORMAL LOW (ref 13.0–17.0)
Lymphocytes Relative: 75 %
Lymphs Abs: 36.9 10*3/uL — ABNORMAL HIGH (ref 0.7–4.0)
MCH: 30.8 pg (ref 26.0–34.0)
MCHC: 32.3 g/dL (ref 30.0–36.0)
MCV: 95.3 fL (ref 80.0–100.0)
Monocytes Absolute: 0 10*3/uL — ABNORMAL LOW (ref 0.1–1.0)
Monocytes Relative: 0 %
Neutro Abs: 12.3 10*3/uL — ABNORMAL HIGH (ref 1.7–7.7)
Neutrophils Relative %: 25 %
Platelets: 211 10*3/uL (ref 150–400)
RBC: 4.06 MIL/uL — ABNORMAL LOW (ref 4.22–5.81)
RDW: 13.4 % (ref 11.5–15.5)
WBC: 49.2 10*3/uL — ABNORMAL HIGH (ref 4.0–10.5)
nRBC: 0 % (ref 0.0–0.2)
nRBC: 0 /100 WBC

## 2022-09-15 LAB — POCT I-STAT 7, (LYTES, BLD GAS, ICA,H+H)
Acid-base deficit: 2 mmol/L (ref 0.0–2.0)
Bicarbonate: 21.8 mmol/L (ref 20.0–28.0)
Calcium, Ion: 1.27 mmol/L (ref 1.15–1.40)
HCT: 36 % — ABNORMAL LOW (ref 39.0–52.0)
Hemoglobin: 12.2 g/dL — ABNORMAL LOW (ref 13.0–17.0)
O2 Saturation: 92 %
Patient temperature: 37.2
Potassium: 3.5 mmol/L (ref 3.5–5.1)
Sodium: 149 mmol/L — ABNORMAL HIGH (ref 135–145)
TCO2: 23 mmol/L (ref 22–32)
pCO2 arterial: 33.1 mmHg (ref 32–48)
pH, Arterial: 7.428 (ref 7.35–7.45)
pO2, Arterial: 62 mmHg — ABNORMAL LOW (ref 83–108)

## 2022-09-15 LAB — CULTURE, BLOOD (ROUTINE X 2)
Culture: NO GROWTH
Culture: NO GROWTH
Special Requests: ADEQUATE
Special Requests: ADEQUATE

## 2022-09-15 LAB — MAGNESIUM: Magnesium: 2.4 mg/dL (ref 1.7–2.4)

## 2022-09-15 LAB — GLUCOSE, CAPILLARY
Glucose-Capillary: 158 mg/dL — ABNORMAL HIGH (ref 70–99)
Glucose-Capillary: 157 mg/dL — ABNORMAL HIGH (ref 70–99)
Glucose-Capillary: 105 mg/dL — ABNORMAL HIGH (ref 70–99)
Glucose-Capillary: 106 mg/dL — ABNORMAL HIGH (ref 70–99)
Glucose-Capillary: 115 mg/dL — ABNORMAL HIGH (ref 70–99)
Glucose-Capillary: 109 mg/dL — ABNORMAL HIGH (ref 70–99)

## 2022-09-15 LAB — PHOSPHORUS: Phosphorus: 4.2 mg/dL (ref 2.5–4.6)

## 2022-09-15 MED ORDER — FENTANYL CITRATE PF 50 MCG/ML IJ SOSY
50.0000 ug | PREFILLED_SYRINGE | INTRAMUSCULAR | Status: DC | PRN
  Administered 2022-09-15 – 2022-09-16 (×5): 100 ug via INTRAVENOUS
  Filled 2022-09-15 (×5): qty 2

## 2022-09-15 MED ORDER — OXYCODONE HCL 5 MG/5ML PO SOLN: 5.0000 mg | Freq: Four times a day (QID) | ORAL | Status: DC | PRN

## 2022-09-15 MED ORDER — FREE WATER
200.0000 mL | Status: DC
  Administered 2022-09-15 – 2022-09-17 (×12): 200 mL

## 2022-09-15 MED ORDER — BANATROL TF EN LIQD
60.0000 mL | Freq: Two times a day (BID) | ENTERAL | Status: DC
  Administered 2022-09-15 – 2022-09-20 (×10): 60 mL
  Filled 2022-09-15 (×11): qty 60

## 2022-09-15 NOTE — Progress Notes (Signed)
NAME:  ANTWYNE PINGREE, MRN:  [id number], DOB:  1961-08-18, LOS: 66 ADMISSION DATE:  Sep 18, 2022, CONSULTATION DATE:  2022-09-18 REFERRING MD:  Maryan Rued - EDP CHIEF COMPLAINT:  Intracranial hemorrhage   History of Present Illness:  61 year old man who presented to [facility] 9/28 as a Code Stroke. PMHx significant for HTN, DVT and chronic tobacco use.  LKW ~0300 9/28. Found down ~0800 by bystanders who called 911. EMS reported the patient had L-sided deficits and AMS. Systolic BP reported to be in the 190s per EMS. CT Head demonstrated acute intraparenchymal hemorrhage with possible lobulated 25mm outpouching arising from the supraclinoid right ICA, suspicious for ruptured aneurysm. Admitted by Neurosurgery for close monitoring and further diagnostic testing.   PCCM was consulted for ventilator management.   Pertinent Medical History:  Hypertension History of DVT Hx Etoh use, not quantified HX of Tobacco use, > 25 pack years  Significant Hospital Events: Including procedures, antibiotic start and stop dates in addition to other pertinent events   9/28 admitted. Intracranial Hemorrhage/ R MCA ruptured aneurysm Non-con CT of head demonstrating large acute hemorrhage in the R lentiform nucleus with with intraventricular extension, midline shift, and slight rounding of the temporal horns, possibly representing early hydrocephalus. On CTA, pt found to have a R MCA ruptured aneurysm. Neurosurgery consulted and following with plans for possible arterial clipping this evening. CT of the face and cervical spine did not show any acute traumatic injuries, though BL cervical lymphadenopathy was noted. Echo with normal biventricular function without evidence of hemodynamically sig valvular heart disease.  9/28 complete echo with LVEF of 60-65%, no regional wall motion abnormalities but with grade 1 diastolic dysfunction.  Normal right ventricular systolic function.  No valvular abnormalities. 9/29 MRI brain 1. Large  acute intraparenchymal hemorrhage centered in the rightlentiform nucleus, with extension into the right-greater-than-left lateral ventricle, third ventricle, and fourth ventricle, overall similar to the prior CT. Mild mass effect and 5 mm of right-to-left midline shift. No hydrocephalus. 2. Small areas of acute infarct in the bilateral occipital lobes, bilateral frontal lobes, and right parietal lobe. Spiking fever. Has sig leukocytosis. Cultures sent. Unasyn started for aspiration  9/29 respiratory culture positive for MSSA 9/29 self extubated, required reintubation 10/2 Family identified  10/6 bronchospasm, steroids of this, bronc with clear lungs, ETT 10/9 Neuro exam/mental status remains poor. Coming down on vent requirements. BP improved, remains off of Cleviprex. D/w family re: tracheostomy. 10/10 Remains hypertensive, agitation. Sedation adjusted (Precedex, Fentanyl/Versed PRN). Increased peak pressures on vent. CXR worse with asymmetric pulmonary edema. Lasix given. Trach with CCM. 10/11 Cortrak placed 10/11 ID consulted, abx changed from Cefazolin to Cefepime  Interim History / Subjective:  NAEON. Tolerated PSV 5/5 for a few hours yesterday but fatigued. Just placed back on PSV this AM and will hopefully move to ATC shortly.  Objective:  Blood pressure (!) 155/83, pulse 62, temperature 99 F (37.2 C), temperature source Rectal, resp. rate 19, height 5\' 7"  (1.702 m), weight 126.7 kg, SpO2 97 %.    Vent Mode: PRVC FiO2 (%):  [40 %-50 %] 50 % Set Rate:  [27 bmp] 27 bmp Vt Set:  [550 mL] 550 mL PEEP:  [5 cmH20] 5 cmH20 Pressure Support:  [5 cmH20] 5 cmH20 Plateau Pressure:  [19 cmH20] 19 cmH20   Intake/Output Summary (Last 24 hours) at 09/15/2022 0810 Last data filed at 09/15/2022 0705 Gross per 24 hour  Intake 7258.4 ml  Output 2470 ml  Net 4788.4 ml    [employer]  09/12/22 0316 09/13/22 0500 09/14/22 0500  Weight: 128.5 kg 127.8 kg 126.7 kg   Physical  Examination: General: Chronically ill appearing male, resting in bed, in NAD. Neuro: On Precedex for agitation, does not follow commands. HEENT: [place]/AT. Sclerae anicteric. Trach C/D/I with mild secretions. Cardiovascular: RRR, no M/R/G.  Lungs: Respirations even and unlabored.  CTA bilaterally, No W/R/R. Abdomen: BS x 4, soft, NT/ND.  Musculoskeletal: No gross deformities, no edema.  Skin: Intact, warm, no rashes.  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 Can continue PRN Fentanyl for pain/discomfort - Neuroprotective measures: HOB > 30 degrees, normoglycemia, normothermia, electrolytes WNL   Hypertension: Longstanding, multidrug at home. Has not been well controlled but now that we have enteral access, hopefully can start to have better control - Goal SBP < 160 - Cleviprex titrated to goal SBP - Continue metoprolol, hydralazine, clonidine, HCTZ - Nimotop for vasospasm prevention, as above  Toxic metabolic encephalopathy: With intermittent increases in continuous IV sedation for ventilator synchrony.  Unable to get reliable exam. - Continue VT Klonopin, oxycodone - D/C precedex as above - Hopeful that sedation needs will decrease now s/p tracheostomy  Acute hypoxemic respiratory failure: In the setting of encephalopathy and mild volume overload as well as PNA as below. MSSA pneumonia status post treatment, BAL 10/10 growing Enterobacter Aerogenes. S/p tracheostomy 10/11 Bronchospasm - Started PSV 5/5 this AM 10/11, continue and work towards ATC trial today. - ID consulted 10/11, changed Cefazolin to Cefepime. - Follow sensitivities. - Bronchodilators (Brovana/Yupelri, scheduled DuoNebs, albuterol PRN) - Pulmonary hygiene -  Intermittent CXR  Fever, Leukocytosis, likely multifactorial; reactive with ICH versus PNA with BAL 10/10 growing Enterobacter as above. - Continue Cefepime (Changed by ID 10/11). - Narrow as able pending Cx data - Consider CT chest/abd/pelv and MRI brain if no improvement after abx change  Hypernatremia: Likely in the setting of salt load with IV fluids as well as diuresis - OK for permissive hypernatremia (goal ~150) in the setting of ICH - FWF to maintain Na goal, drop from q1hr to q4hrs - Hold further lasix for now. - Continue HCTZ   Hyperglycemia: Worse with steroids, improving with insulin changes - Basal Semglee 10U daily - TF coverage 5U daily - SSI  ? 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 - Will need further workup and oncology input once over acute issues and pending his neurologic recovery.  Best Practice: (right click and "Reselect all SmartList Selections" daily)   Diet/type: tubefeeds DVT prophylaxis: SCDs GI prophylaxis: PPI Lines: Arterial Line and yes and it is still needed Foley:  Yes, and it is still needed Code Status:  full code   CC time: 30 min.   Rutherford Guys, PA - C [facility] For pager details, please see AMION or use Epic chat  After 1900, please call ELINK for cross coverage needs 09/15/2022, 8:10 AM

## 2022-09-15 NOTE — Evaluation (Signed)
Occupational Therapy Evaluation and Discharge until medically appropriate Patient Details Name: Dustin Jones MRN: [id number] DOB: 1961/03/06 Today's Date: 09/15/2022   History of Present Illness The pt is a 61 y.o. unidentified homeless male presenting 9/28 after being found down by bystanders. 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 S/P coil embolization of R ICA aneurysm 9/28. Intubated 9/29. Trach placement 10/10. No PMH on file.   Clinical Impression   Assume Pt PLOF was mod I - however today Pt unresponsive to multimodal stimuli including noxious stimuli in all 4 extremities. Flaccid throughout - PROM WFL.  Pt did not open eyes, and when OT opened eyes did not blink to threat. No change with rolling/repositioning. At this time Pt is unable to participate in therapy, OT will sign off. Should cognition or level of arousal change - please re-order.      Recommendations for follow up therapy are one component of a multi-disciplinary discharge planning process, led by the attending physician.  Recommendations may be updated based on patient status, additional functional criteria and insurance authorization.   Follow Up Recommendations  Skilled nursing-short term rehab (<3 hours/day) (vs LTACH)    Assistance Recommended at Discharge Frequent or constant Supervision/Assistance  Patient can return home with the following Other (comment) (total A)    Functional Status Assessment  Patient has had a recent decline in their functional status and/or demonstrates limited ability to make significant improvements in function in a reasonable and predictable amount of time  Equipment Recommendations  Other (comment) (defer to next venue)    Recommendations for Other Services Other (comment) (Palliative)     Precautions / Restrictions Precautions Precautions: Fall;Other  (comment) Precaution Comments: trach collar; SBP < 160; Art-line; flexiseal Restrictions Weight Bearing Restrictions: No      Mobility Bed Mobility Overal bed mobility: Needs Assistance Bed Mobility: Rolling Rolling: Total assist, +2 for physical assistance, +2 for safety/equipment         General bed mobility comments: total A x2    Transfers                   General transfer comment: deferred due to low level of arousal      Balance                                           ADL either performed or assessed with clinical judgement   ADL Overall ADL's : Needs assistance/impaired                                       General ADL Comments: total A at this time     Vision   Vision Assessment?: Vision impaired- to be further tested in functional context Additional Comments: Pt did not blink to threat or track when eyes opened by OT     Perception     Praxis      Pertinent Vitals/Pain Pain Assessment Pain Assessment: Faces Faces Pain Scale: No hurt Pain Location: did not change facial expression with noxious stimuli Pain Intervention(s): Monitored during session     Hand Dominance     Extremity/Trunk Assessment Upper Extremity Assessment Upper Extremity Assessment: Difficult to assess due to impaired cognition (BUE flaccid throughout  session, no movement noted - RN reports that he will move "spontaneously - but not purposefully")   Lower Extremity Assessment Lower Extremity Assessment: Defer to PT evaluation;Difficult to assess due to impaired cognition   Cervical / Trunk Assessment Cervical / Trunk Assessment: Other exceptions Cervical / Trunk Exceptions: increased body habitus   Communication Communication Communication: Tracheostomy   Cognition Arousal/Alertness: Lethargic Behavior During Therapy: Flat affect Overall Cognitive Status: Difficult to assess                                  General Comments: Pt lathergic and unable to arouse throughout session, despite painful and noxious stimuli or dependent bed mobility. Did not follow any cues or track with eyes when eye lids dependently opened     General Comments  trach collar (ATC) 10L 40%    Exercises     Shoulder Instructions      Home Living Family/patient expects to be discharged to:: Shelter/Homeless                                 Additional Comments: Per chart, pt is homeless      Prior Functioning/Environment Prior Level of Function : Patient poor historian/Family not available             Mobility Comments: Unable to arouse pt and no family present. Per chart, pt was homeless. Assuming pt was likely IND-modI.          OT Problem List: Decreased strength;Decreased range of motion;Decreased cognition;Cardiopulmonary status limiting activity;Obesity      OT Treatment/Interventions:      OT Goals(Current goals can be found in the care plan section) Acute Rehab OT Goals Patient Stated Goal: none stated OT Goal Formulation: Patient unable to participate in goal setting Time For Goal Achievement: 09/29/22  OT Frequency:      Co-evaluation              AM-PAC OT "6 Clicks" Daily Activity     Outcome Measure Help from another person eating meals?: Total Help from another person taking care of personal grooming?: Total Help from another person toileting, which includes using toliet, bedpan, or urinal?: Total Help from another person bathing (including washing, rinsing, drying)?: Total Help from another person to put on and taking off regular upper body clothing?: Total Help from another person to put on and taking off regular lower body clothing?: Total 6 Click Score: 6   End of Session Equipment Utilized During Treatment: Oxygen Nurse Communication: Mobility status  Activity Tolerance: Patient limited by lethargy Patient left: in bed;with call bell/phone within  reach;with bed alarm set  OT Visit Diagnosis: Muscle weakness (generalized) (M62.81);Other symptoms and signs involving the nervous system (R29.898);Other symptoms and signs involving cognitive function                Time: 7096-2836 OT Time Calculation (min): 11 min Charges:  OT General Charges $OT Visit: 1 Visit OT Evaluation $OT Eval High Complexity: 1 High  Jesse Sans OTR/L Acute Rehabilitation Services Office: [place] 09/15/2022, 12:13 PM

## 2022-09-15 NOTE — Progress Notes (Signed)
RT placed pt on trach collar (ATC) 10L 40% with ventilator on stby in room. Pt tolerating well at this time. RN made aware. RT will continue to monitor pt.

## 2022-09-15 NOTE — Progress Notes (Signed)
Pt remains on ATC tolerating well with SVS. Ventilator still on stby in room. RT will continue to monitor pt.

## 2022-09-15 NOTE — Progress Notes (Signed)
Nutrition Follow-up  DOCUMENTATION CODES:   Obesity unspecified  INTERVENTION:   Tube feeding via Cortrak tube:  Osmolite 1.5 at 55 ml/h (1320 ml per day) Prosource TF20 60 ml BID  Provides 2140 kcal, 122 gm protein, 1003 ml free water daily  Add Banatrol TF BID  200 ml free water every 4 hours Total free water: 2203 ml   NUTRITION DIAGNOSIS:   Inadequate oral intake related to inability to eat as evidenced by NPO status. Ongoing.   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   Pt discussed during ICU rounds and with RN and MD. Pt weaned to trach collar this am.  Per MD pt with leukemoid reaction vs CLL   9/29 - TF started 9/30 - TF changed to Nepro providing: 2456 kcal and 127 grams of protein  10/2 - TF changed to Osmolite 1.5 10/10 - s/p trach placement 10/11 - s/p cortrak placement; tip gastric per xray  Medications reviewed and include: colace, SSI, 5 units novolog every 4 hours, 10 units semglee daily, nimotop, protonix, miralax   Labs reviewed: Na 149 A1C: 6.5 CBG: 105-249   Diet Order:   Diet Order             Diet NPO time specified  Diet effective now                   EDUCATION NEEDS:   No education needs have been identified at this time  Skin:  Skin Assessment: Skin Integrity Issues: Skin Integrity Issues:: Stage II Stage II: R leg (pretibial) - old healing ulcer  Last BM:  95 ml via rectal tube  Height:   Ht Readings from Last 1 Encounters:  08/15/2022 _0  (1.702 m)    Weight:   Wt Readings from Last 1 Encounters:  09/14/22 126.7 kg    BMI:  Body mass index is 43.75  kg/m.  Estimated Nutritional Needs:   Kcal:  2000-2200  Protein:  100-120 grams  Fluid:  >2 L/day  Lockie Pares., RD, LDN, CNSC See AMiON for contact information

## 2022-09-16 ENCOUNTER — Inpatient Hospital Stay (HOSPITAL_COMMUNITY): Payer: Medicaid Other

## 2022-09-16 LAB — CBC WITH DIFFERENTIAL/PLATELET
Abs Immature Granulocytes: 0.29 10*3/uL — ABNORMAL HIGH (ref 0.00–0.07)
Basophils Absolute: 0.1 10*3/uL (ref 0.0–0.1)
Basophils Relative: 0 %
Eosinophils Absolute: 0.3 10*3/uL (ref 0.0–0.5)
Eosinophils Relative: 1 %
HCT: 42.7 % (ref 39.0–52.0)
Hemoglobin: 13.1 g/dL (ref 13.0–17.0)
Immature Granulocytes: 1 %
Lymphocytes Relative: 77 %
Lymphs Abs: 43.3 10*3/uL — ABNORMAL HIGH (ref 0.7–4.0)
MCH: 29.9 pg (ref 26.0–34.0)
MCHC: 30.7 g/dL (ref 30.0–36.0)
MCV: 97.5 fL (ref 80.0–100.0)
Monocytes Absolute: 1.9 10*3/uL — ABNORMAL HIGH (ref 0.1–1.0)
Monocytes Relative: 3 %
Neutro Abs: 10 10*3/uL — ABNORMAL HIGH (ref 1.7–7.7)
Neutrophils Relative %: 18 %
Platelets: 230 10*3/uL (ref 150–400)
RBC: 4.38 MIL/uL (ref 4.22–5.81)
RDW: 13.6 % (ref 11.5–15.5)
WBC: 55.9 10*3/uL (ref 4.0–10.5)
nRBC: 0.1 % (ref 0.0–0.2)

## 2022-09-16 LAB — CULTURE, BAL-QUANTITATIVE W GRAM STAIN: Culture: 80000 — AB

## 2022-09-16 LAB — GLUCOSE, CAPILLARY
Glucose-Capillary: 103 mg/dL — ABNORMAL HIGH (ref 70–99)
Glucose-Capillary: 118 mg/dL — ABNORMAL HIGH (ref 70–99)
Glucose-Capillary: 121 mg/dL — ABNORMAL HIGH (ref 70–99)
Glucose-Capillary: 126 mg/dL — ABNORMAL HIGH (ref 70–99)
Glucose-Capillary: 127 mg/dL — ABNORMAL HIGH (ref 70–99)
Glucose-Capillary: 98 mg/dL (ref 70–99)

## 2022-09-16 LAB — BASIC METABOLIC PANEL
Anion gap: 9 (ref 5–15)
BUN: 30 mg/dL — ABNORMAL HIGH (ref 8–23)
CO2: 23 mmol/L (ref 22–32)
Calcium: 8.7 mg/dL — ABNORMAL LOW (ref 8.9–10.3)
Chloride: 116 mmol/L — ABNORMAL HIGH (ref 98–111)
Creatinine, Ser: 0.8 mg/dL (ref 0.61–1.24)
GFR, Estimated: >60 mL/min (ref >60)
Glucose, Bld: 117 mg/dL — ABNORMAL HIGH (ref 70–99)
Potassium: 4.3 mmol/L (ref 3.5–5.1)
Sodium: 148 mmol/L — ABNORMAL HIGH (ref 135–145)

## 2022-09-16 LAB — MAGNESIUM: Magnesium: 2.2 mg/dL (ref 1.7–2.4)

## 2022-09-16 LAB — PHOSPHORUS: Phosphorus: 3.8 mg/dL (ref 2.5–4.6)

## 2022-09-16 MED ORDER — POTASSIUM CHLORIDE 20 MEQ PO PACK: 40.0000 meq | PACK | Freq: Every day | ORAL | Status: DC

## 2022-09-16 MED ORDER — IOHEXOL 350 MG/ML SOLN
75.0000 mL | Freq: Once | INTRAVENOUS | Status: AC | PRN
  Administered 2022-09-16: 75 mL via INTRAVENOUS

## 2022-09-16 MED ORDER — FUROSEMIDE 10 MG/ML IJ SOLN
40.0000 mg | Freq: Every day | INTRAMUSCULAR | Status: AC
  Administered 2022-09-16: 40 mg via INTRAVENOUS
  Filled 2022-09-16: qty 4

## 2022-09-16 MED ORDER — CLONAZEPAM 0.5 MG PO TBDP
0.5000 mg | ORAL_TABLET | Freq: Two times a day (BID) | ORAL | Status: DC
  Administered 2022-09-16 – 2022-09-22 (×12): 0.5 mg
  Filled 2022-09-16 (×5): qty 1
  Filled 2022-09-16: qty 2
  Filled 2022-09-16: qty 1
  Filled 2022-09-16: qty 2
  Filled 2022-09-16: qty 1
  Filled 2022-09-16: qty 2

## 2022-09-16 MED ORDER — FUROSEMIDE 10 MG/ML IJ SOLN: 40.0000 mg | Freq: Every day | INTRAMUSCULAR | Status: DC

## 2022-09-16 NOTE — Progress Notes (Addendum)
[facility] Infectious Diseases Follow Up Note  Patient Identification: Patient Name: KRISTEN BUSHWAY MRN: [id number] Admit Date: 08/11/2022  9:01 AM Age: 61 y.o.Today's Date: 09/16/2022  Reason for Visit: VAP, fevers, leukocytosis  Principal Problem:   ICH (intracerebral hemorrhage) (HCC) Active Problems:   Intracranial hemorrhage (HCC)   Pressure injury of skin   Acute respiratory failure with hypoxia (HCC)   Aspiration pneumonia of both lungs (HCC)   Hypernatremia   Tracheostomy in place (HCC)   Ventilator associated pneumonia (HCC)   Leukocytosis   Fever  Antibiotics:  Unasyn 9/28-9/29 Cefazolin 10/1- 10/10 Cefepime 10/11-c   Lines/Hardware:  tacheostomy, FT and flexiseal   Interval Events: fever curve is trending down, Tmax 100.8 in the last 24 hrs , WBC up to 55.9   Assessment 61 year old male with PMH of HTN, DVT and chronic tobacco and alcohol use admitted with bilateral intraparenchymal hemorrhage in the 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 Hospital course also significant for new diagnosis of CLL, tx for MSSA PNA. S/p tracheostomy on 10/11. ID engaged for fevers and chronic leukocytosis.    # VAP ( Enterobacter aerogenes) # Leukoytosis in the setting of known CLL, went up to 55.9 today  # Fevers - fever curve is downtrending overall  CT CAP unremarkable for any occult abscesses.  Clinically, patient appears to be improving on talking to RN with doing well on trach collar, responds to voice, opens eyes. Leukocytosis likely CLL related vs leukemoid reaction. As previously said, another thing to consider if persistently febrile and worsening is MRI brain for itra parenchymal hemorrhage turning into brain abscess  Recommendations Complete 7 days of IV cefepime for VAP Monitor for fevers, worsening signs of sepsis Leukocytosis seems to be related to CLL with leukemoid  component and would fu with hem/onc ID available as needed. Please recall if needed.   Rest of the management as per the primary team. Thank you for the consult. Please page with pertinent questions or concerns.  ______________________________________________________________________ Subjective patient seen and examined at the bedside.  He is getting chest PT at the time of my evaluation  Per RN, patient opens eyes and follows some commands.    Vitals BP (!) 145/99   Pulse 86   Temp 100.3 F (37.9 C) (Rectal)   Resp (!) 22   Ht _0  (1.702 m)   Wt 126.7 kg   SpO2 97%   BMI 43.75 kg/m     Physical Exam Constitutional:  lying in the bed, morbidly obese    Comments: on chest PT, on trach collar, opens eyes but does not follow commands   Cardiovascular:     Rate and Rhythm: Normal rate and regular rhythm.     Heart sounds:   Pulmonary:     Effort: Pulmonary effort is normal on trach collar    Comments:   Abdominal:     Palpations: Abdomen is soft.     Tenderness: non distended and non tender  Musculoskeletal:        General: No swelling or tenderness.   Skin:    Comments: np obvious rashes   Neurological:     General: opens eyes but does not track and does not follow commands     Pertinent Microbiology Results for orders placed or performed during the hospital encounter of 08/20/2022  Resp Panel by RT-PCR (Flu A&B, Covid) Anterior Nasal Swab     Status: None   Collection Time: 08/18/2022  9:01 AM   Specimen: Anterior Nasal Swab  Result Value Ref Range Status   SARS Coronavirus 2 by RT PCR NEGATIVE NEGATIVE Final    Comment: (NOTE) SARS-CoV-2 target nucleic acids are NOT DETECTED.  The SARS-CoV-2 RNA is generally detectable in upper respiratory specimens during the acute phase of infection. The lowest concentration of SARS-CoV-2 viral copies this assay can detect is 138 copies/mL. A negative result does not preclude SARS-Cov-2 infection and should not be used  as the sole basis for treatment or other patient management decisions. A negative result may occur with  improper specimen collection/handling, submission of specimen other than nasopharyngeal swab, presence of viral mutation(s) within the areas targeted by this assay, and inadequate number of viral copies(<138 copies/mL). A negative result must be combined with clinical observations, patient history, and epidemiological information. The expected result is Negative.  Fact Sheet for Patients:  EntrepreneurPulse.com.au  Fact Sheet for Healthcare Providers:  IncredibleEmployment.be  This test is no t yet approved or cleared by the [place] FDA and  has been authorized for detection and/or diagnosis of SARS-CoV-2 by FDA under an Emergency Use Authorization (EUA). This EUA will remain  in effect (meaning this test can be used) for the duration of the COVID-19 declaration under Section 564(b)(1) of the Act, 21 U.S.C.section 360bbb-3(b)(1), unless the authorization is terminated  or revoked sooner.       Influenza A by PCR NEGATIVE NEGATIVE Final   Influenza B by PCR NEGATIVE NEGATIVE Final    Comment: (NOTE) The Xpert Xpress SARS-CoV-2/FLU/RSV plus assay is intended as an aid in the diagnosis of influenza from Nasopharyngeal swab specimens and should not be used as a sole basis for treatment. Nasal washings and aspirates are unacceptable for Xpert Xpress SARS-CoV-2/FLU/RSV testing.  Fact Sheet for Patients: EntrepreneurPulse.com.au  Fact Sheet for Healthcare Providers: IncredibleEmployment.be  This test is not yet approved or cleared by the [place] FDA and has been authorized for detection and/or diagnosis of SARS-CoV-2 by FDA under an Emergency Use Authorization (EUA). This EUA will remain in effect (meaning this test can be used) for the duration of the COVID-19 declaration under Section 564(b)(1)  of the Act, 21 U.S.C. section 360bbb-3(b)(1), unless the authorization is terminated or revoked.  Performed at [facility], [place] [address], [place], [place] [address]   MRSA Next Gen by PCR, Nasal     Status: None   Collection Time: 08/23/2022  4:30 PM   Specimen: Nasal Mucosa; Nasal Swab  Result Value Ref Range Status   MRSA by PCR Next Gen NOT DETECTED NOT DETECTED Final    Comment: (NOTE) The GeneXpert MRSA Assay (FDA approved for NASAL specimens only), is one component of a comprehensive MRSA colonization surveillance program. It is not intended to diagnose MRSA infection nor to guide or monitor treatment for MRSA infections. Test performance is not FDA approved in patients less than 30 years old. Performed at [facility], [place] [address], [place], [place] [address]   Culture, Respiratory w Gram Stain     Status: None   Collection Time: 09/02/22 10:08 AM   Specimen: Tracheal Aspirate; Respiratory  Result Value Ref Range Status   Specimen Description TRACHEAL ASPIRATE  Final   Special Requests NONE  Final   Gram Stain   Final    RARE WBC PRESENT,BOTH PMN AND MONONUCLEAR FEW GRAM POSITIVE COCCI IN PAIRS Performed at [facility], [address] [place], [place] [address]    Culture  MODERATE STAPHYLOCOCCUS AUREUS  Final   Report Status 09/04/2022 FINAL  Final   Organism ID, Bacteria STAPHYLOCOCCUS AUREUS  Final      Susceptibility   Staphylococcus aureus - MIC*    CIPROFLOXACIN <=0.5 SENSITIVE Sensitive     ERYTHROMYCIN >=8 RESISTANT Resistant     GENTAMICIN <=0.5 SENSITIVE Sensitive     OXACILLIN 0.5 SENSITIVE Sensitive     TETRACYCLINE <=1 SENSITIVE Sensitive     VANCOMYCIN 1 SENSITIVE Sensitive     TRIMETH/SULFA <=10 SENSITIVE Sensitive     CLINDAMYCIN RESISTANT Resistant     RIFAMPIN <=0.5 SENSITIVE Sensitive     Inducible Clindamycin POSITIVE Resistant     * MODERATE STAPHYLOCOCCUS AUREUS  Culture, Respiratory w Gram Stain     Status: None    Collection Time: 09/08/22 10:58 PM   Specimen: Tracheal Aspirate; Respiratory  Result Value Ref Range Status   Specimen Description TRACHEAL ASPIRATE  Final   Special Requests Normal  Final   Gram Stain   Final    RARE WBC PRESENT, PREDOMINANTLY PMN FEW BUDDING YEAST SEEN Performed at [facility], [place] [address] [place], [place] [address]    Culture FEW CANDIDA DUBLINIENSIS  Final   Report Status 09/12/2022 FINAL  Final  Culture, blood (Routine X 2) w Reflex to ID Panel     Status: None   Collection Time: 09/09/22 12:32 AM   Specimen: BLOOD LEFT ARM  Result Value Ref Range Status   Specimen Description BLOOD LEFT ARM  Final   Special Requests   Final    BOTTLES DRAWN AEROBIC AND ANAEROBIC Blood Culture adequate volume   Culture   Final    NO GROWTH 5 DAYS Performed at [facility], [place] [address] [place], [place] [address]    Report Status 09/14/2022 FINAL  Final  Culture, blood (Routine X 2) w Reflex to ID Panel     Status: Abnormal   Collection Time: 09/09/22 12:32 AM   Specimen: BLOOD LEFT WRIST  Result Value Ref Range Status   Specimen Description BLOOD LEFT WRIST  Final   Special Requests   Final    IN PEDIATRIC BOTTLE Blood Culture results may not be optimal due to an inadequate volume of blood received in culture bottles   Culture  Setup Time   Final    GRAM POSITIVE COCCI IN PAIRS IN CLUSTERS IN PEDIATRIC BOTTLE CRITICAL RESULT CALLED TO, READ BACK BY AND VERIFIED WITH: PHARMD CAREN AMEND ON 09/09/22 @ 1952 BY DRT    Culture (A)  Final    STAPHYLOCOCCUS EPIDERMIDIS THE SIGNIFICANCE OF ISOLATING THIS ORGANISM FROM A SINGLE SET OF BLOOD CULTURES WHEN MULTIPLE SETS ARE DRAWN IS UNCERTAIN. PLEASE NOTIFY THE MICROBIOLOGY DEPARTMENT WITHIN ONE WEEK IF SPECIATION AND SENSITIVITIES ARE REQUIRED. Performed at [facility], [place] [address] [place], [place] [address]    Report Status 09/10/2022 FINAL  Final  Blood Culture ID Panel (Reflexed)     Status:  Abnormal   Collection Time: 09/09/22 12:32 AM  Result Value Ref Range Status   Enterococcus faecalis NOT DETECTED NOT DETECTED Final   Enterococcus Faecium NOT DETECTED NOT DETECTED Final   Listeria monocytogenes NOT DETECTED NOT DETECTED Final   Staphylococcus species DETECTED (A) NOT DETECTED Final    Comment: CRITICAL RESULT CALLED TO, READ BACK BY AND VERIFIED WITH: PHARMD CAREN AMEND ON 09/09/22 @ 1952 BY DRT    Staphylococcus aureus (BCID) NOT DETECTED NOT DETECTED Final   Staphylococcus epidermidis  DETECTED (A) NOT DETECTED Final    Comment: CRITICAL RESULT CALLED TO, READ BACK BY AND VERIFIED WITH: PHARMD CAREN AMEND ON 09/09/22 @ 1952 BY DRT    Staphylococcus lugdunensis NOT DETECTED NOT DETECTED Final   Streptococcus species NOT DETECTED NOT DETECTED Final   Streptococcus agalactiae NOT DETECTED NOT DETECTED Final   Streptococcus pneumoniae NOT DETECTED NOT DETECTED Final   Streptococcus pyogenes NOT DETECTED NOT DETECTED Final   A.calcoaceticus-baumannii NOT DETECTED NOT DETECTED Final   Bacteroides fragilis NOT DETECTED NOT DETECTED Final   Enterobacterales NOT DETECTED NOT DETECTED Final   Enterobacter cloacae complex NOT DETECTED NOT DETECTED Final   Escherichia coli NOT DETECTED NOT DETECTED Final   Klebsiella aerogenes NOT DETECTED NOT DETECTED Final   Klebsiella oxytoca NOT DETECTED NOT DETECTED Final   Klebsiella pneumoniae NOT DETECTED NOT DETECTED Final   Proteus species NOT DETECTED NOT DETECTED Final   Salmonella species NOT DETECTED NOT DETECTED Final   Serratia marcescens NOT DETECTED NOT DETECTED Final   Haemophilus influenzae NOT DETECTED NOT DETECTED Final   Neisseria meningitidis NOT DETECTED NOT DETECTED Final   Pseudomonas aeruginosa NOT DETECTED NOT DETECTED Final   Stenotrophomonas maltophilia NOT DETECTED NOT DETECTED Final   Candida albicans NOT DETECTED NOT DETECTED Final   Candida auris NOT DETECTED NOT DETECTED Final   Candida glabrata NOT  DETECTED NOT DETECTED Final   Candida krusei NOT DETECTED NOT DETECTED Final   Candida parapsilosis NOT DETECTED NOT DETECTED Final   Candida tropicalis NOT DETECTED NOT DETECTED Final   Cryptococcus neoformans/gattii NOT DETECTED NOT DETECTED Final   Methicillin resistance mecA/C NOT DETECTED NOT DETECTED Final    Comment: Performed at [facility], [address], [place], [place] [address]  Culture, blood (Routine X 2) w Reflex to ID Panel     Status: None   Collection Time: 09/10/22 12:41 PM   Specimen: BLOOD  Result Value Ref Range Status   Specimen Description BLOOD  Final   Special Requests   Final    BOTTLES DRAWN AEROBIC ONLY Blood Culture adequate volume   Culture   Final    NO GROWTH 5 DAYS Performed at [facility], [place] [address] [place], [place] [address]    Report Status 09/15/2022 FINAL  Final  Culture, blood (Routine X 2) w Reflex to ID Panel     Status: None   Collection Time: 09/10/22  1:02 PM   Specimen: BLOOD  Result Value Ref Range Status   Specimen Description BLOOD BLOOD RIGHT HAND  Final   Special Requests   Final    BOTTLES DRAWN AEROBIC AND ANAEROBIC Blood Culture adequate volume   Culture   Final    NO GROWTH 5 DAYS Performed at [facility], [place] [address], [place], [place] [address]    Report Status 09/15/2022 FINAL  Final  Culture, BAL-quantitative w Gram Stain     Status: Abnormal   Collection Time: 09/13/22 12:01 PM   Specimen: Bronchoalveolar Lavage; Tracheal  Result Value Ref Range Status   Specimen Description BRONCHIAL ALVEOLAR LAVAGE  Final   Special Requests NONE  Final   Gram Stain   Final    RARE WBC PRESENT,BOTH PMN AND MONONUCLEAR NO ORGANISMS SEEN Performed at [facility], [place] [address] [place], [place] [address]    Culture 80,000 COLONIES/mL ENTEROBACTER CLOACAE (A)  Final   Report Status 09/16/2022 FINAL  Final   Organism ID, Bacteria ENTEROBACTER CLOACAE (A)  Final  Susceptibility    Enterobacter cloacae - MIC*    CEFAZOLIN >=64 RESISTANT Resistant     CEFEPIME 0.25 SENSITIVE Sensitive     CEFTAZIDIME >=64 RESISTANT Resistant     CIPROFLOXACIN <=0.25 SENSITIVE Sensitive     GENTAMICIN <=1 SENSITIVE Sensitive     IMIPENEM 0.5 SENSITIVE Sensitive     TRIMETH/SULFA <=20 SENSITIVE Sensitive     PIP/TAZO >=128 RESISTANT Resistant     * 80,000 COLONIES/mL ENTEROBACTER CLOACAE     Pertinent Lab.    Latest Ref Rng & Units 09/16/2022    6:35 AM 09/15/2022    4:57 AM 09/15/2022    4:44 AM  CBC  WBC 4.0 - 10.5 K/uL 55.9   49.2   Hemoglobin 13.0 - 17.0 g/dL 13.1  12.2  12.5   Hematocrit 39.0 - 52.0 % 42.7  36.0  38.7   Platelets 150 - 400 K/uL 230   211       Latest Ref Rng & Units 09/16/2022    6:35 AM 09/15/2022    4:57 AM 09/15/2022    4:44 AM  CMP  Glucose 70 - 99 mg/dL 117   191   BUN 8 - 23 mg/dL 30   35   Creatinine 0.61 - 1.24 mg/dL 0.80   0.80   Sodium 135 - 145 mmol/L 148  149  149   Potassium 3.5 - 5.1 mmol/L 4.3  3.5  3.6   Chloride 98 - 111 mmol/L 116   114   CO2 22 - 32 mmol/L 23   23   Calcium 8.9 - 10.3 mg/dL 8.7   9.4      Pertinent Imaging today Plain films and CT images have been personally visualized and interpreted; radiology reports have been reviewed. Decision making incorporated into the Impression / Recommendations.  CT CHEST ABDOMEN PELVIS W CONTRAST  Result Date: 09/16/2022 CLINICAL DATA:  Sepsis. EXAM: CT CHEST, ABDOMEN, AND PELVIS WITH CONTRAST TECHNIQUE: Multidetector CT imaging of the chest, abdomen and pelvis was performed following the standard protocol during bolus administration of intravenous contrast. RADIATION DOSE REDUCTION: This exam was performed according to the departmental dose-optimization program which includes automated exposure control, adjustment of the mA and/or kV according to patient size and/or use of iterative reconstruction technique. CONTRAST:  21m OMNIPAQUE IOHEXOL 350 MG/ML SOLN COMPARISON:  Recent  chest and abdominal radiographs. Chest CT 08/08/2021. No previous abdominopelvic CT. FINDINGS: CT CHEST FINDINGS Cardiovascular: Suboptimal contrast bolus. Allowing for this, no acute vascular findings are identified. There is atherosclerosis of the aorta, great vessels and coronary arteries. The heart size is normal. There is no pericardial effusion. Mediastinum/Nodes: Multiple mildly enlarged axillary and supraclavicular lymph nodes are again demonstrated bilaterally, similar to the previous study. There is a right axillary node measuring 11 mm on image 29/3 and a left supraclavicular node measuring 1.5 cm on image 8/3. No enlarged mediastinal lymph nodes are seen. Hilar assessment is limited by the lack of intravenous contrast, although the hilar contours appear unchanged. A feeding tube and tracheostomy are in place. The thyroid gland, trachea and esophagus demonstrate no significant findings. Lungs/Pleura: No pleural effusion or pneumothorax. Mild dependent opacities in both lungs are most likely due to atelectasis. No consolidation. Musculoskeletal/Chest wall: No chest wall mass or suspicious osseous findings. Spondylosis noted. CT ABDOMEN AND PELVIS FINDINGS Hepatobiliary: Previously demonstrated diffuse hepatic steatosis has mildly improved in the interval. No focal hepatic abnormalities are seen. No evidence of gallstones, gallbladder wall thickening or biliary dilatation. Pancreas: Unremarkable.  No pancreatic ductal dilatation or surrounding inflammatory changes. Spleen: Normal in size without focal abnormality. Adrenals/Urinary Tract: Both adrenal glands appear normal. No evidence of urinary tract calculus, suspicious renal lesion or hydronephrosis. The bladder appears normal for its degree of distention. Stomach/Bowel: No enteric contrast administered. Feeding tube projects into the distal stomach. The stomach appears unremarkable for its degree of distension. No evidence of bowel wall thickening,  distention or surrounding inflammatory change. The appendix appears normal. There are diverticular changes within the sigmoid colon. A rectal tube is in place. Vascular/Lymphatic: Scattered prominent retroperitoneal and pelvic lymph nodes, including a right external iliac node measuring 1.7 cm on image 112/3. Diffuse aortic and branch vessel atherosclerosis without evidence of aneurysm or large vessel occlusion. Reproductive: The prostate gland and seminal vesicles appear unremarkable. Other: 7.6 cm umbilical hernia containing only fat. Soft tissue stranding in the subcutaneous fat of the anterior abdominal wall attributed to subcutaneous injections. No ascites, free air or focal extraluminal fluid collection identified. There is some subcutaneous edema laterally in both buttocks. Musculoskeletal: No acute or significant osseous findings. Lower lumbar spondylosis. IMPRESSION: 1. No acute findings or clear explanation for sepsis identified. 2. Mild dependent opacities in both lungs, most likely due to atelectasis. No consolidation. 3. Mildly prominent lymph nodes in the chest, abdomen and pelvis, similar to previous chest CT. These are nonspecific, and could be reactive or secondary to a low-grade lymphoproliferative process. 4. Umbilical hernia containing only fat. 5. Sigmoid diverticulosis without evidence of acute inflammation. 6. Improved hepatic steatosis. 7.  Aortic Atherosclerosis (ICD10-I70.0). Electronically Signed   By: Richardean Sale M.D.   On: 09/16/2022 11:40      I spent more 55 minutes for this patient encounter including review of prior medical records, coordination of care with primary/other specialist with greater than 50% of time being face to face/counseling and discussing diagnostics/treatment plan with the patient/family.  Electronically signed by:   Rosiland Oz, MD Infectious Disease Physician [facility] Infectious [facility] Pager: [phone]

## 2022-09-16 NOTE — Progress Notes (Signed)
SLP Cancellation Note  Patient Details Name: Dustin Jones MRN: [id number] DOB: 12-Oct-1961   Cancelled treatment:       Reason Eval/Treat Not Completed: Medical issues which prohibited therapy;Fatigue/lethargy limiting ability to participate  Orders received for PMSV, BSE, and SLE. Per RN, pt was given Fentanyl earlier to help with WOB. He is arousable, but is still lethargic, and unable to consistently follow directions. Pt is making progress, however, continues inappropriate for SLP evaluations at this time. Will continue efforts.   Jayleene Glaeser B. Quentin Ore, [facility], [place] Speech Language Pathologist Office: [phone]  Shonna Chock 09/16/2022, 9:32 AM

## 2022-09-16 NOTE — Progress Notes (Signed)
Physical Therapy Treatment Patient Details Name: Dustin Jones MRN: [id number] DOB: August 25, 1961 Today's Date: 09/16/2022   History of Present Illness The pt is a 61 y.o. unidentified homeless male presenting 9/28 after being found down by bystanders. 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 S/P coil embolization of R ICA aneurysm 9/28. Intubated 9/29. Trach placement 10/10. No PMH on file.    PT Comments    Pt is demonstrating improved arousal today, having his eyes open and blinking throughout the majority of the session. He did not track therapist though and maintained a flat affect with forward gaze throughout. He did demonstrate an ability to actively move his R upper and lower extremity, following ~75% of simple cues with these limbs. However, he did not spontaneously or actively move his L limbs, except his L shoulder and intermittent L UE tremors. Pt continues to require TA x2-3 for functional mobility due to difficulty following commands with tasks, but was able to maintain L sidelying with modA once provided hand-over-hand cues to grab L rail with R UE. Will continue to follow acutely. Current recommendations remain appropriate.     Recommendations for follow up therapy are one component of a multi-disciplinary discharge planning process, led by the attending physician.  Recommendations may be updated based on patient status, additional functional criteria and insurance authorization.  Follow Up Recommendations  Skilled nursing-short term rehab (<3 hours/day) (vs LTACH) Can patient physically be transported by private vehicle: No   Assistance Recommended at Discharge Frequent or constant Supervision/Assistance  Patient can return home with the following Two people to help with walking and/or transfers;Two people to help with bathing/dressing/bathroom;Assistance with  cooking/housework;Assistance with feeding;Direct supervision/assist for medications management;Direct supervision/assist for financial management;Help with stairs or ramp for entrance;Assist for transportation   Equipment Recommendations  Other (comment) (TBA)    Recommendations for Other Services       Precautions / Restrictions Precautions Precautions: Fall;Other (comment) Precaution Comments: trach on vent; SBP < 160; Art-line; flexiseal; cortrak; R wrist restraint Restrictions Weight Bearing Restrictions: No     Mobility  Bed Mobility Overal bed mobility: Needs Assistance Bed Mobility: Rolling Rolling: Total assist, +2 for physical assistance, +2 for safety/equipment         General bed mobility comments: TAx2-3 to roll pt either direction in bed to change bed pad as pt not following cues to pull/roll. Pt able to hold L bed rail with R UE with hand-over-hand guidance, maintaining L sidelying with modA. TA to maintain R sidelying.    Transfers                   General transfer comment: deferred    Ambulation/Gait               General Gait Details: deferred   Stairs             Wheelchair Mobility    Modified Rankin (Stroke Patients Only) Modified Rankin (Stroke Patients Only) Pre-Morbid Rankin Score: No symptoms Modified Rankin: Severe disability     Balance Overall balance assessment: Needs assistance                                          Cognition Arousal/Alertness: Lethargic, Awake/alert Behavior During Therapy: Flat affect Overall Cognitive Status: Impaired/Different from baseline Area of Impairment: Attention,  Following commands, Problem solving                   Current Attention Level: Focused   Following Commands: Follows one step commands inconsistently, Follows one step commands with increased time     Problem Solving: Slow processing General Comments: Pt with eyes slightly open and  blinking majority of session. However, did not make eye contact/focus on anyone and did not track, maintained forward gaze. Pt following simple cues to move his R UE or lower extremity ~75% of time, but did not follow cues for functional mobility or L sided movements.        Exercises      General Comments General comments (skin integrity, edema, etc.): noted tremor throughout L UE multiple times and occasional L shoulder AROM spontaneously      Pertinent Vitals/Pain Pain Assessment Pain Assessment: Faces Faces Pain Scale: Hurts a little bit Pain Location: grimacing with coughing/deep suctioning by RN Pain Descriptors / Indicators: Grimacing Pain Intervention(s): Monitored during session    Home Living                          Prior Function            PT Goals (current goals can now be found in the care plan section) Acute Rehab PT Goals Patient Stated Goal: did not state PT Goal Formulation: Patient unable to participate in goal setting Time For Goal Achievement: 09/28/22 Potential to Achieve Goals: Fair Progress towards PT goals: Progressing toward goals (slowly)    Frequency    Min 2X/week      PT Plan Current plan remains appropriate    Co-evaluation              AM-PAC PT "6 Clicks" Mobility   Outcome Measure  Help needed turning from your back to your side while in a flat bed without using bedrails?: Total Help needed moving from lying on your back to sitting on the side of a flat bed without using bedrails?: Total Help needed moving to and from a bed to a chair (including a wheelchair)?: Total Help needed standing up from a chair using your arms (e.g., wheelchair or bedside chair)?: Total Help needed to walk in hospital room?: Total Help needed climbing 3-5 steps with a railing? : Total 6 Click Score: 6    End of Session Equipment Utilized During Treatment: Oxygen Activity Tolerance: Patient limited by lethargy Patient left: in  bed;with call bell/phone within reach;with bed alarm set;with restraints reapplied Nurse Communication: Mobility status PT Visit Diagnosis: Muscle weakness (generalized) (M62.81);Difficulty in walking, not elsewhere classified (R26.2);Other symptoms and signs involving the nervous system (R29.898)     Time: 5621-3086 PT Time Calculation (min) (ACUTE ONLY): 23 min  Charges:  $Therapeutic Activity: 23-37 mins                     Moishe Spice, PT, DPT Acute Rehabilitation Services  Office: [place] 09/16/2022, 2:34 PM

## 2022-09-16 NOTE — Progress Notes (Signed)
NAME:  Dustin Jones, MRN:  [id number], DOB:  03-07-1961, LOS: 15 ADMISSION DATE:  September 12, 2022, CONSULTATION DATE:  12-Sep-2022 REFERRING MD:  Anitra Lauth - EDP CHIEF COMPLAINT:  Intracranial hemorrhage   History of Present Illness:  61 year old man who presented [facility] 9/28 as a Code Stroke. PMHx significant for HTN, DVT and chronic tobacco use.  LKW ~0300 9/28. Found down ~0800 by bystanders who called 911. EMS reported the patient had L-sided deficits and AMS. Systolic BP reported to be in the 190s per EMS. CT Head demonstrated acute intraparenchymal hemorrhage with possible lobulated 21mm outpouching arising from the supraclinoid right ICA, suspicious for ruptured aneurysm. Admitted by Neurosurgery for close monitoring and further diagnostic testing.   PCCM was consulted for ventilator management.   Pertinent Medical History:  Hypertension History of DVT Hx Etoh use, not quantified HX of Tobacco use, > 25 pack years  Significant Hospital Events: Including procedures, antibiotic start and stop dates in addition to other pertinent events   9/28 admitted. Intracranial Hemorrhage/ R MCA ruptured aneurysm Non-con CT of head demonstrating large acute hemorrhage in the R lentiform nucleus with with intraventricular extension, midline shift, and slight rounding of the temporal horns, possibly representing early hydrocephalus. On CTA, pt found to have a R MCA ruptured aneurysm. Neurosurgery consulted and following with plans for possible arterial clipping this evening. CT of the face and cervical spine did not show any acute traumatic injuries, though BL cervical lymphadenopathy was noted. Echo with normal biventricular function without evidence of hemodynamically sig valvular heart disease.  9/28 complete echo with LVEF of 60-65%, no regional wall motion abnormalities but with grade 1 diastolic dysfunction.  Normal right ventricular systolic function.  No valvular abnormalities. 9/29 MRI brain 1. Large  acute intraparenchymal hemorrhage centered in the rightlentiform nucleus, with extension into the right-greater-than-left lateral ventricle, third ventricle, and fourth ventricle, overall similar to the prior CT. Mild mass effect and 5 mm of right-to-left midline shift. No hydrocephalus. 2. Small areas of acute infarct in the bilateral occipital lobes, bilateral frontal lobes, and right parietal lobe. Spiking fever. Has sig leukocytosis. Cultures sent. Unasyn started for aspiration  9/29 respiratory culture positive for MSSA 9/29 self extubated, required reintubation 10/2 Family identified  10/6 bronchospasm, steroids of this, bronc with clear lungs, ETT 10/9 Neuro exam/mental status remains poor. Coming down on vent requirements. BP improved, remains off of Cleviprex. D/w family re: tracheostomy. 10/10 Remains hypertensive, agitation. Sedation adjusted (Precedex, Fentanyl/Versed PRN). Increased peak pressures on vent. CXR worse with asymmetric pulmonary edema. Lasix given. Trach with CCM. 10/11 Cortrak placed 10/11 ID consulted, abx changed from Cefazolin to Cefepime 10/13 WBC up to 56 (was 49 day prior). CT chest/abd/pelv ordered.  Interim History / Subjective:  Tolerated ATC since yesterday. WBC up to 56. 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 rate 16, height 5\' 7"  (1.702 m), weight 126.7 kg, SpO2 93 %.    FiO2 (%):  [40 %] 40 %   Intake/Output Summary (Last 24 hours) at 09/16/2022 0907 Last data filed at 09/16/2022 0600 Gross per 24 hour  Intake 2022.75 ml  Output 3100 ml  Net -1077.25 ml    Filed Weights   09/12/22 0316 09/13/22 0500 09/14/22 0500  Weight: 128.5 kg 127.8 kg 126.7 kg   Physical Examination: General: Chronically ill appearing male, resting in bed, in  NAD. Neuro: Intermittently opens eyes but does  not follow commands. HEENT: [place]/AT. Sclerae anicteric. Trach C/D/I with mild secretions, on ATC Cardiovascular: RRR, no M/R/G.  Lungs: Respirations even and unlabored.  CTA bilaterally, No W/R/R. Abdomen: BS x 4, soft, NT/ND.  Musculoskeletal: No gross deformities, no edema.  Skin: Intact, warm, no rashes.  Assessment & Plan:   Intracranial hemorrhage/right ICA ruptured aneurysm status post coiling: - Last seen by NSGY 10/1 - Goal SBP < 160 - Hold all AC/antiplatelet agents - Continue nimotop - Continue to hold Precedex and other sedating meds - Neuroprotective measures: HOB > 30 degrees, normoglycemia, normothermia, electrolytes WNL   Hypertension: Longstanding, multidrug at home. Has not been well controlled but now that we have enteral access, hopefully can start to have better control - Goal SBP < 160 - Continue metoprolol, hydralazine, clonidine, HCTZ - Nimotop for vasospasm prevention, as above  Toxic metabolic encephalopathy: Unable to get reliable exam. - Continue VT Klonopin (reduced 10/12), oxycodone PRN - D/C precedex as above - Hopeful that sedation needs will decrease now s/p tracheostomy  Acute hypoxemic respiratory failure: In the setting of encephalopathy and mild volume overload as well as PNA as below. MSSA pneumonia status post treatment, BAL 10/10 growing Enterobacter Aerogenes. S/p tracheostomy 10/11 Bronchospasm - Continue ATC as tolerated - ID consulted 10/11, changed Cefazolin to Cefepime. - Lasix 40mg  x 1 today - Follow sensitivities. - Bronchodilators (Brovana/Yupelri, scheduled DuoNebs, albuterol PRN) - Pulmonary hygiene - Intermittent CXR  Fever, Leukocytosis, likely multifactorial; reactive with ICH versus PNA with BAL 10/10 growing Enterobacter as above. - Continue Cefepime (Changed by ID 10/11). - Narrow as able pending Cx data - Will add CT chest/abd/pelv to r/o other infection  Hypernatremia: Likely in the setting of salt load with IV  fluids as well as diuresis, improving. - OK for permissive hypernatremia (goal ~150) in the setting of ICH - FWF to maintain Na goal, drop from q1hr to q4hrs 10/12 - Continue HCTZ   Hyperglycemia: now a bit low - D/c Semglee for now - TF coverage 5U daily - SSI  ? 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 - Will need further workup and oncology input once over acute issues and pending his neurologic recovery.  Best Practice: (right click and "Reselect all SmartList Selections" daily)   Diet/type: tubefeeds DVT prophylaxis: SCDs GI prophylaxis: PPI Lines: Arterial Line and yes and it is still needed Foley:  Yes, and it is still needed Code Status:  full code   CC time: 30 min.   Montey Hora, [facility] For pager details, please see AMION or use Epic chat  After 1900, please call [place] for cross coverage needs 09/16/2022, 9:07 AM

## 2022-09-16 NOTE — Progress Notes (Signed)
Pt remains on ATC 10L 40% pt tolerating well, ventilator still on stby in room. RT will continue to monitor pt.

## 2022-09-16 NOTE — Progress Notes (Signed)
Pt remains on 40% TC w/no increase work of breathing, strong PC and maintaining O2 sats between 93% & 97%. BS essentially clear w/good bilat aeration but diminished throughout. 5am ABG on hold for clarification if QAM order is active w/ discontinuation of A-line or PRN for apparent distress. RN notified.

## 2022-09-17 DIAGNOSIS — J189 Pneumonia, unspecified organism: Secondary | ICD-10-CM

## 2022-09-17 DIAGNOSIS — D72823 Leukemoid reaction: Secondary | ICD-10-CM

## 2022-09-17 LAB — BASIC METABOLIC PANEL
Anion gap: 9 (ref 5–15)
BUN: 30 mg/dL — ABNORMAL HIGH (ref 8–23)
CO2: 23 mmol/L (ref 22–32)
Calcium: 9.2 mg/dL (ref 8.9–10.3)
Chloride: 116 mmol/L — ABNORMAL HIGH (ref 98–111)
Creatinine, Ser: 0.8 mg/dL (ref 0.61–1.24)
GFR, Estimated: >60 mL/min (ref >60)
Glucose, Bld: 140 mg/dL — ABNORMAL HIGH (ref 70–99)
Potassium: 3.9 mmol/L (ref 3.5–5.1)
Sodium: 148 mmol/L — ABNORMAL HIGH (ref 135–145)

## 2022-09-17 LAB — CBC WITH DIFFERENTIAL/PLATELET
Abs Immature Granulocytes: 0.21 10*3/uL — ABNORMAL HIGH (ref 0.00–0.07)
Basophils Absolute: 0.2 10*3/uL — ABNORMAL HIGH (ref 0.0–0.1)
Basophils Relative: 0 %
Eosinophils Absolute: 0.3 10*3/uL (ref 0.0–0.5)
Eosinophils Relative: 1 %
HCT: 42.8 % (ref 39.0–52.0)
Hemoglobin: 13.3 g/dL (ref 13.0–17.0)
Immature Granulocytes: 0 %
Lymphocytes Relative: 80 %
Lymphs Abs: 38.4 10*3/uL — ABNORMAL HIGH (ref 0.7–4.0)
MCH: 30.3 pg (ref 26.0–34.0)
MCHC: 31.1 g/dL (ref 30.0–36.0)
MCV: 97.5 fL (ref 80.0–100.0)
Monocytes Absolute: 1.6 10*3/uL — ABNORMAL HIGH (ref 0.1–1.0)
Monocytes Relative: 3 %
Neutro Abs: 7.8 10*3/uL — ABNORMAL HIGH (ref 1.7–7.7)
Neutrophils Relative %: 16 %
Platelets: 230 10*3/uL (ref 150–400)
RBC: 4.39 MIL/uL (ref 4.22–5.81)
RDW: 13.2 % (ref 11.5–15.5)
WBC: 48.4 10*3/uL — ABNORMAL HIGH (ref 4.0–10.5)
nRBC: 0 % (ref 0.0–0.2)

## 2022-09-17 LAB — GLUCOSE, CAPILLARY
Glucose-Capillary: 115 mg/dL — ABNORMAL HIGH (ref 70–99)
Glucose-Capillary: 136 mg/dL — ABNORMAL HIGH (ref 70–99)
Glucose-Capillary: 150 mg/dL — ABNORMAL HIGH (ref 70–99)
Glucose-Capillary: 143 mg/dL — ABNORMAL HIGH (ref 70–99)
Glucose-Capillary: 123 mg/dL — ABNORMAL HIGH (ref 70–99)

## 2022-09-17 LAB — PHOSPHORUS: Phosphorus: 3.8 mg/dL (ref 2.5–4.6)

## 2022-09-17 LAB — MAGNESIUM: Magnesium: 2.3 mg/dL (ref 1.7–2.4)

## 2022-09-17 MED ORDER — FREE WATER
300.0000 mL | Status: DC
  Administered 2022-09-17 – 2022-09-20 (×18): 300 mL

## 2022-09-17 MED ORDER — LISINOPRIL 20 MG PO TABS
40.0000 mg | ORAL_TABLET | Freq: Every day | ORAL | Status: DC
  Administered 2022-09-18 – 2022-10-14 (×27): 40 mg
  Filled 2022-09-17 (×5): qty 2
  Filled 2022-09-17 (×2): qty 4
  Filled 2022-09-17 (×10): qty 2
  Filled 2022-09-17 (×2): qty 4
  Filled 2022-09-17 (×3): qty 2
  Filled 2022-09-17 (×2): qty 4
  Filled 2022-09-17 (×4): qty 2

## 2022-09-17 NOTE — Progress Notes (Addendum)
NAME:  Dustin Jones, MRN:  [id number], DOB:  1960/12/28, LOS: 63 ADMISSION DATE:  09-17-22, CONSULTATION DATE:  09/17/2022 REFERRING MD:  Maryan Rued - EDP CHIEF COMPLAINT:  Intracranial hemorrhage   History of Present Illness:  61 year old man who presented to [facility] 9/28 as a Code Stroke. PMHx significant for HTN, DVT and chronic tobacco use.  LKW ~0300 9/28. Found down ~0800 by bystanders who called 911. EMS reported the patient had L-sided deficits and AMS. Systolic BP reported to be in the 190s per EMS. CT Head demonstrated acute intraparenchymal hemorrhage with possible lobulated 26mm outpouching arising from the supraclinoid right ICA, suspicious for ruptured aneurysm. Admitted by Neurosurgery for close monitoring and further diagnostic testing.   PCCM was consulted for ventilator management.   Pertinent Medical History:  Hypertension History of DVT Hx Etoh use, not quantified HX of Tobacco use, > 25 pack years  Significant Hospital Events: Including procedures, antibiotic start and stop dates in addition to other pertinent events   9/28 admitted. Intracranial Hemorrhage/ R MCA ruptured aneurysm Non-con CT of head demonstrating large acute hemorrhage in the R lentiform nucleus with with intraventricular extension, midline shift, and slight rounding of the temporal horns, possibly representing early hydrocephalus. On CTA, pt found to have a R MCA ruptured aneurysm. Neurosurgery consulted and following with plans for possible arterial clipping this evening. CT of the face and cervical spine did not show any acute traumatic injuries, though BL cervical lymphadenopathy was noted. Echo with normal biventricular function without evidence of hemodynamically sig valvular heart disease.  9/28 complete echo with LVEF of 60-65%, no regional wall motion abnormalities but with grade 1 diastolic dysfunction.  Normal right ventricular systolic function.  No valvular abnormalities. 9/29 MRI brain 1. Large  acute intraparenchymal hemorrhage centered in the rightlentiform nucleus, with extension into the right-greater-than-left lateral ventricle, third ventricle, and fourth ventricle, overall similar to the prior CT. Mild mass effect and 5 mm of right-to-left midline shift. No hydrocephalus. 2. Small areas of acute infarct in the bilateral occipital lobes, bilateral frontal lobes, and right parietal lobe. Spiking fever. Has sig leukocytosis. Cultures sent. Unasyn started for aspiration  9/29 respiratory culture positive for MSSA 9/29 self extubated, required reintubation 10/2 Family identified  10/6 bronchospasm, steroids of this, bronc with clear lungs, ETT 10/9 Neuro exam/mental status remains poor. Coming down on vent requirements. BP improved, remains off of Cleviprex. D/w family re: tracheostomy. 10/10 Remains hypertensive, agitation. Sedation adjusted (Precedex, Fentanyl/Versed PRN). Increased peak pressures on vent. CXR worse with asymmetric pulmonary edema. Lasix given. Trach with CCM. 10/11 Cortrak placed 10/11 ID consulted, abx changed from Cefazolin to Cefepime 10/13 WBC up to 56 (was 49 day prior). CT chest/abd/pelv ordered.  Interim History / Subjective:  Tolerating trach collar. Needs restraint for RUE. Occasional secretions.   Objective:  Blood pressure (!) 152/89, pulse 88, temperature 100.3 F (37.9 C), temperature source Axillary, resp. rate 19, height 5\' 7"  (1.702 m), weight 124.6 kg, SpO2 90 %.    FiO2 (%):  [35 %-40 %] 38 %   Intake/Output Summary (Last 24 hours) at 09/17/2022 1110 Last data filed at 09/17/2022 0600 Gross per 24 hour  Intake 1083.07 ml  Output 4070 ml  Net -2986.93 ml   Filed Weights   09/13/22 0500 09/14/22 0500 09/17/22 0600  Weight: 127.8 kg 126.7 kg 124.6 kg   Physical Examination: Gen:     acutely ill appearing HEENT: trach collar with mild secretions Lungs:    rhonchi with secretions CV:  RRR, no mrg Abd:      + bowel sounds; soft,  non-tender; no palpable masses, no distension Ext:    No edema, skin is wrinkled and dry Skin:      Warm and dry; no rashes Neuro:   withdraws to pain and purposeful, does not follow commands, intermittently alert    Assessment & Plan:   Intracranial hemorrhage/right ICA ruptured aneurysm status post coiling: - Last seen by NSGY 10/1 - Goal SBP < 160 - Hold all AC/antiplatelet agents - Continue nimotop - Continue to hold Precedex and other sedating meds - Neuroprotective measures: HOB > 30 degrees, normoglycemia, normothermia, electrolytes WNL   Hypertension: Longstanding, multidrug at home.  - Goal SBP < 160 - Continue amlodipine, lisinopril, hydralazine, clonidine, HCTZ, prn lasix - will discuss with pharmacy if anything else can be added  - Nimotop for vasospasm prevention, as above  Toxic metabolic encephalopathy: Unable to get reliable exam. - Continue low dose clonazepam at 0.5 mg BID, oxycodone PRN - D/C precedex as above  Acute hypoxemic respiratory failure: In the setting of encephalopathy and mild volume overload as well as PNA as below. MSSA pneumonia status post treatment, BAL 10/10 growing Enterobacter Aerogenes. S/p tracheostomy 10/11 Bronchospasm - Continue ATC as tolerated - ID consulted 10/11, changed Cefazolin to Cefepime. - cultures show resistant enterobacter, so continue cefepime for total 8 day course.  - hold lasix - Bronchodilators (Brovana/Yupelri, scheduled DuoNebs, albuterol PRN) - Pulmonary hygiene  Hypernatremia: Likely in the setting of salt load with IV fluids as well as diuresis - increased free h20 flushes to q3 hours.  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 - CT chest a/p negative for malignancy, infection   Best Practice: (right click and "Reselect all  SmartList Selections" daily)   Diet/type: tubefeeds at goal DVT prophylaxis: SCDs GI prophylaxis: PPI Lines: none Foley:  none Code Status:  full code updated Alice, the patient's daughter's mother by phone.  Ok to transfer to progressive. Will sign out to [facility] to assume care 10/15.   I spent 35 minutes in total visit time for this patient, with more than 50% spent counseling/coordinating care.  Lenice Llamas, MD Pulmonary and [place] 09/17/2022 11:17 AM Pager: see AMION  If no response to pager, please call critical care on call (see AMION) until 7pm After 7:00 pm call [place]

## 2022-09-18 ENCOUNTER — Inpatient Hospital Stay (HOSPITAL_COMMUNITY): Payer: Medicaid Other

## 2022-09-18 LAB — BLOOD GAS, ARTERIAL
Acid-Base Excess: 1.5 mmol/L (ref 0.0–2.0)
Bicarbonate: 26.6 mmol/L (ref 20.0–28.0)
Drawn by: 24610
O2 Saturation: 97.5 %
Patient temperature: 37
pCO2 arterial: 43 mmHg (ref 32–48)
pH, Arterial: 7.4 (ref 7.35–7.45)
pO2, Arterial: 68 mmHg — ABNORMAL LOW (ref 83–108)

## 2022-09-18 LAB — CBC WITH DIFFERENTIAL/PLATELET
Abs Immature Granulocytes: 0.15 10*3/uL — ABNORMAL HIGH (ref 0.00–0.07)
Basophils Absolute: 0.1 10*3/uL (ref 0.0–0.1)
Basophils Relative: 0 %
Eosinophils Absolute: 0.4 10*3/uL (ref 0.0–0.5)
Eosinophils Relative: 1 %
HCT: 39.1 % (ref 39.0–52.0)
Hemoglobin: 12.3 g/dL — ABNORMAL LOW (ref 13.0–17.0)
Immature Granulocytes: 0 %
Lymphocytes Relative: 77 %
Lymphs Abs: 36.5 10*3/uL — ABNORMAL HIGH (ref 0.7–4.0)
MCH: 30.1 pg (ref 26.0–34.0)
MCHC: 31.5 g/dL (ref 30.0–36.0)
MCV: 95.8 fL (ref 80.0–100.0)
Monocytes Absolute: 2.9 10*3/uL — ABNORMAL HIGH (ref 0.1–1.0)
Monocytes Relative: 6 %
Neutro Abs: 7.7 10*3/uL (ref 1.7–7.7)
Neutrophils Relative %: 16 %
Platelets: 262 10*3/uL (ref 150–400)
RBC: 4.08 MIL/uL — ABNORMAL LOW (ref 4.22–5.81)
RDW: 13.2 % (ref 11.5–15.5)
WBC: 47.7 10*3/uL — ABNORMAL HIGH (ref 4.0–10.5)
nRBC: 0 % (ref 0.0–0.2)

## 2022-09-18 LAB — GLUCOSE, CAPILLARY
Glucose-Capillary: 113 mg/dL — ABNORMAL HIGH (ref 70–99)
Glucose-Capillary: 117 mg/dL — ABNORMAL HIGH (ref 70–99)
Glucose-Capillary: 119 mg/dL — ABNORMAL HIGH (ref 70–99)
Glucose-Capillary: 125 mg/dL — ABNORMAL HIGH (ref 70–99)
Glucose-Capillary: 132 mg/dL — ABNORMAL HIGH (ref 70–99)
Glucose-Capillary: 149 mg/dL — ABNORMAL HIGH (ref 70–99)
Glucose-Capillary: 160 mg/dL — ABNORMAL HIGH (ref 70–99)

## 2022-09-18 NOTE — Care Plan (Signed)
CT Head ordered on this pt. Multiple attempts to reach the floor with no answer as well as a message sent to RN with no response at this time.

## 2022-09-18 NOTE — Progress Notes (Signed)
RT unavail until now, RN and charge RT aware-critical staffing protocol in place per management.  No chest vest in room, RT required back in ICU.  No resp distress noted currently.

## 2022-09-18 NOTE — Progress Notes (Signed)
PROGRESS NOTE  Dustin Jones:[id number] DOB: September 09, 1961 DOA: 2022-03-25 PCP: Lavinia SharpsPlacey, Mary Ann, NP   LOS: 17 days   Brief Narrative / Interim history: 61 year old man with history of prior DVT, EtOH, tobacco use was brought into the hospital on 9/28 as a code stroke.  He has left-sided deficits, was hypertensive with a systolic in the 190s, and CT head on admission demonstrated acute intraparenchymal hemorrhage, and findings suspicious for ruptured aneurysm.  Significant events: 9/28 admitted. Intracranial Hemorrhage/ R MCA ruptured aneurysm Non-con CT of head demonstrating large acute hemorrhage in the R lentiform nucleus with with intraventricular extension, midline shift, and slight rounding of the temporal horns, possibly representing early hydrocephalus. On CTA, pt found to have a R MCA ruptured aneurysm. Neurosurgery consulted and following with plans for possible arterial clipping this evening. CT of the face and cervical spine did not show any acute traumatic injuries, though BL cervical lymphadenopathy was noted. Echo with normal biventricular function without evidence of hemodynamically sig valvular heart disease.  9/28 complete echo with LVEF of 60-65%, no regional wall motion abnormalities but with grade 1 diastolic dysfunction.  Normal right ventricular systolic function.  No valvular abnormalities. 9/29 MRI brain 1. Large acute intraparenchymal hemorrhage centered in the rightlentiform nucleus, with extension into the right-greater-than-left lateral ventricle, third ventricle, and fourth ventricle, overall similar to the prior CT. Mild mass effect and 5 mm of right-to-left midline shift. No hydrocephalus. 2. Small areas of acute infarct in the bilateral occipital lobes, bilateral frontal lobes, and right parietal lobe. Spiking fever. Has sig leukocytosis. Cultures sent. Unasyn started for aspiration  9/29 respiratory culture positive for MSSA 9/29 self extubated, required  reintubation 10/2 Family identified  10/6 bronchospasm, steroids of this, bronc with clear lungs, ETT 10/9 Neuro exam/mental status remains poor. Coming down on vent requirements. BP improved, remains off of Cleviprex. D/w family re: tracheostomy. 10/10 Remains hypertensive, agitation. Sedation adjusted (Precedex, Fentanyl/Versed PRN). Increased peak pressures on vent. CXR worse with asymmetric pulmonary edema. Lasix given. Trach with CCM. 10/11 Cortrak placed 10/11 ID consulted, abx changed from Cefazolin to Cefepime 10/13 WBC up to 56 (was 49 day prior). CT chest/abd/pelv ordered. 10/15-transferred to the hospitalist service  Subjective / 24h Interval events: Appears to be sleeping, but wakes up when interacted with.  Has a trach and is not speaking, but does follow commands.  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  Overall has remained stable, most recent CT scan of the head was on 10/5 which showed no significant change in size and morphology of the intraparenchymal hemorrhage right basal ganglia, has surrounding vasogenic edema and a 5 mm of right-to-left shift which was similar.  Intraventricular blood in the lateral ventricles were decreased. -He was also found to have small areas of acute infarcts in bilateral occipital lobes, bilateral frontal lobes and right parietal lobe.   Neurology followed patient while hospitalized -Continue to hold all antiplatelet agents,  blood thinners -Difficult to tell prognosis at this point  Active problems  Hypertension-continue home medications with amlodipine, clonidine, lisinopril, HCTZ.  Goal blood pressure less than 160.  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 CT chest a/p negative for malignancy, infection   Acute hypoxemic respiratory failure-In the setting of encephalopathy and mild volume overload as well as PNA as below. -Continue trach, bronchodilators, pulmonary hygiene  MSSA pneumonia status post treatment, BAL 10/10 growing Enterobacter Aerogenes -ID consulted, continue cefepime, end date 10/18  S/p tracheostomy 10/11-PCCM will follow tracheostomy  Hypernatremia-likely in the setting of salt load with IV fluids as well as diuresis.  Continue free water flushes, monitor   DM2- A1C 6.5%, continue SSI  CBG (last 3)  Recent Labs    09/18/22 0011 09/18/22 0346 09/18/22 0817  GLUCAP 132* 113* 125*    Scheduled Meds:  amLODipine  10 mg Per Tube Daily   arformoterol  15 mcg Nebulization BID   Chlorhexidine Gluconate Cloth  6 each Topical Daily   clonazepam  0.5 mg Per Tube BID   cloNIDine  0.3 mg Per Tube TID   docusate  100 mg Per Tube BID   feeding supplement (PROSource TF20)  60 mL Per Tube BID   fiber supplement (BANATROL TF)  60 mL Per Tube BID   free water  300 mL Per Tube Q4H   heparin injection (subcutaneous)  5,000 Units Subcutaneous Q8H   hydrochlorothiazide  25 mg Per Tube Daily   insulin aspart  0-15 Units Subcutaneous Q4H   lisinopril  40 mg Per Tube Daily   niMODipine  60 mg Oral Q4H   Or   niMODipine  60 mg Per Tube Q4H   mouth rinse  15 mL Mouth Rinse Q2H   pantoprazole  40  mg Per Tube Daily   polyethylene glycol  17 g Per Tube Daily   revefenacin  175 mcg Nebulization Daily   Continuous Infusions:  ceFEPime (MAXIPIME) IV 2 g (09/18/22 0513)   feeding supplement (OSMOLITE 1.5 CAL) 1,000 mL (09/18/22 0703)   PRN Meds:.acetaminophen **OR** acetaminophen (TYLENOL) oral liquid 160 mg/5 mL **OR** acetaminophen, albuterol, mouth rinse, oxyCODONE  Current Outpatient Medications  Medication Instructions   cloNIDine (CATAPRES) 0.3 mg, Oral, 2 times daily   cyclobenzaprine (FLEXERIL) 10 mg, Oral, At bedtime PRN   hydrALAZINE (APRESOLINE) 100 mg, Oral, 3 times daily   lisinopril (ZESTRIL) 40 mg, Oral, Daily   meloxicam (MOBIC) 15 mg, Oral, Daily PRN   simvastatin (ZOCOR) 20 mg, Oral, Daily at bedtime    Diet Orders (From admission, onward)     Start     Ordered   09/13/22 1121  Diet NPO time specified  (Pre Percutaneous Dilitation Tracheostomy)  Diet effective now       Comments: Clarify time of tube feed discontinuation with provider.   09/13/22 1120            DVT prophylaxis: heparin injection 5,000 Units Start: 09/03/22 1400 SCD's Start: 09-14-22 0930   Lab Results  Component Value Date   PLT 262 09/18/2022      Code Status: Full Code  Family Communication: No family at bedside  Status is: Inpatient  Remains inpatient appropriate because: Severity of illness  Level of care: Progressive  Consultants:  ID PCCM  Objective: Vitals:  09/18/22 0728 09/18/22 0946 09/18/22 1004 09/18/22 1017  BP: (!) 140/81   137/74  Pulse: 79 91  95  Resp: 18 (!) 21  17  Temp: 98 F (36.7 C)   98.1 F (36.7 C)  TempSrc: Axillary   Axillary  SpO2: (!) 88% 95% 95% 96%  Weight:      Height:        Intake/Output Summary (Last 24 hours) at 09/18/2022 1022 Last data filed at 09/18/2022 0500 Gross per 24 hour  Intake 1758.8 ml  Output 2750 ml  Net -991.2 ml   Wt Readings from Last 3 Encounters:  09/18/22 123.1 kg     Examination:  Constitutional: No apparent distress Eyes: no scleral icterus ENMT: Mucous membranes are moist.  Neck: normal, supple Respiratory: Bibasilar rhonchi, no wheezing Cardiovascular: Regular rate and rhythm, no murmurs / rubs / gallops. No LE edema.  Abdomen: non distended, no tenderness. Bowel sounds positive.  Musculoskeletal: no clubbing / cyanosis.  Skin: no rashes Neurologic: non focal   Data Reviewed: I have independently reviewed following labs and imaging studies   CBC Recent Labs  Lab 09/14/22 0337 09/15/22 0444 09/15/22 0457 09/16/22 0635 09/17/22 0736 09/18/22 0719  WBC 46.5* 49.2*  --  55.9* 48.4* 47.7*  HGB 13.0 12.5* 12.2* 13.1 13.3 12.3*  HCT 41.4 38.7* 36.0* 42.7 42.8 39.1  PLT 201 211  --  230 230 262  MCV 95.8 95.3  --  97.5 97.5 95.8  MCH 30.1 30.8  --  29.9 30.3 30.1  MCHC 31.4 32.3  --  30.7 31.1 31.5  RDW 13.3 13.4  --  13.6 13.2 13.2  LYMPHSABS 33.6* 36.9*  --  43.3* 38.4* 36.5*  MONOABS 0.7 0.0*  --  1.9* 1.6* 2.9*  EOSABS 0.1 0.0  --  0.3 0.3 0.4  BASOSABS 0.1 0.0  --  0.1 0.2* 0.1    Recent Labs  Lab 09/12/22 0519 09/13/22 0342 09/13/22 0844 09/14/22 0233 09/14/22 0337 09/15/22 0444 09/15/22 0457 09/16/22 0635 09/17/22 0736  NA 152*   < > 150*   < > 153* 149* 149* 148* 148*  K 4.1   < > 4.2   < > 4.4 3.6 3.5 4.3 3.9  CL 117*  --  114*  --  119* 114*  --  116* 116*  CO2 29  --  28  --  26 23  --  23 23  GLUCOSE 166*  --  178*  --  145* 191*  --  117* 140*  BUN 94*  --  56*  --  40* 35*  --  30* 30*  CREATININE 1.67*  --  1.25*  --  1.02 0.80  --  0.80 0.80  CALCIUM 9.0  --  9.1  --  9.4 9.4  --  8.7* 9.2  ALBUMIN 2.8*  --   --   --   --   --   --   --   --   MG  --   --   --   --  2.4 2.4  --  2.2 2.3   < > = values in this interval not displayed.    ------------------------------------------------------------------------------------------------------------------ No results for input(s): "CHOL", "HDL", "LDLCALC",  "TRIG", "CHOLHDL", "LDLDIRECT" in the last 72 hours.  Lab Results  Component Value Date   HGBA1C 6.5 (H) 09-30-2022   ------------------------------------------------------------------------------------------------------------------ No results for input(s): "TSH", "T4TOTAL", "T3FREE", "THYROIDAB" in the last 72 hours.  Invalid input(s): "FREET3"  Cardiac Enzymes No results  for input(s): "CKMB", "TROPONINI", "MYOGLOBIN" in the last 168 hours.  Invalid input(s): "CK" ------------------------------------------------------------------------------------------------------------------ No results found for: "BNP"  CBG: Recent Labs  Lab 09/17/22 1545 09/17/22 2002 09/18/22 0011 09/18/22 0346 09/18/22 0817  GLUCAP 143* 123* 132* 113* 125*    Recent Results (from the past 240 hour(s))  Culture, Respiratory w Gram Stain     Status: None   Collection Time: 09/08/22 10:58 PM   Specimen: Tracheal Aspirate; Respiratory  Result Value Ref Range Status   Specimen Description TRACHEAL ASPIRATE  Final   Special Requests Normal  Final   Gram Stain   Final    RARE WBC PRESENT, PREDOMINANTLY PMN FEW BUDDING YEAST SEEN Performed at [facility], [address], [place], [place] [address]    Culture FEW CANDIDA DUBLINIENSIS  Final   Report Status 09/12/2022 FINAL  Final  Culture, blood (Routine X 2) w Reflex to ID Panel     Status: None   Collection Time: 09/09/22 12:32 AM   Specimen: BLOOD LEFT ARM  Result Value Ref Range Status   Specimen Description BLOOD LEFT ARM  Final   Special Requests   Final    BOTTLES DRAWN AEROBIC AND ANAEROBIC Blood Culture adequate volume   Culture   Final    NO GROWTH 5 DAYS Performed at [facility], [address] [place], [place] [address]    Report Status 09/14/2022 FINAL  Final  Culture, blood (Routine X 2) w Reflex to ID Panel     Status: Abnormal   Collection Time: 09/09/22 12:32 AM   Specimen: BLOOD LEFT WRIST  Result Value Ref Range  Status   Specimen Description BLOOD LEFT WRIST  Final   Special Requests   Final    IN PEDIATRIC BOTTLE Blood Culture results may not be optimal due to an inadequate volume of blood received in culture bottles   Culture  Setup Time   Final    GRAM POSITIVE COCCI IN PAIRS IN CLUSTERS IN PEDIATRIC BOTTLE CRITICAL RESULT CALLED TO, READ BACK BY AND VERIFIED WITH: PHARMD CAREN AMEND ON 09/09/22 @ 1952 BY DRT    Culture (A)  Final    STAPHYLOCOCCUS EPIDERMIDIS THE SIGNIFICANCE OF ISOLATING THIS ORGANISM FROM A SINGLE SET OF BLOOD CULTURES WHEN MULTIPLE SETS ARE DRAWN IS UNCERTAIN. PLEASE NOTIFY THE MICROBIOLOGY DEPARTMENT WITHIN ONE WEEK IF SPECIATION AND SENSITIVITIES ARE REQUIRED. Performed at [facility], [address] [place], [place] [address]    Report Status 09/10/2022 FINAL  Final  Blood Culture ID Panel (Reflexed)     Status: Abnormal   Collection Time: 09/09/22 12:32 AM  Result Value Ref Range Status   Enterococcus faecalis NOT DETECTED NOT DETECTED Final   Enterococcus Faecium NOT DETECTED NOT DETECTED Final   Listeria monocytogenes NOT DETECTED NOT DETECTED Final   Staphylococcus species DETECTED (A) NOT DETECTED Final    Comment: CRITICAL RESULT CALLED TO, READ BACK BY AND VERIFIED WITH: PHARMD CAREN AMEND ON 09/09/22 @ 1952 BY DRT    Staphylococcus aureus (BCID) NOT DETECTED NOT DETECTED Final   Staphylococcus epidermidis DETECTED (A) NOT DETECTED Final    Comment: CRITICAL RESULT CALLED TO, READ BACK BY AND VERIFIED WITH: PHARMD CAREN AMEND ON 09/09/22 @ 1952 BY DRT    Staphylococcus lugdunensis NOT DETECTED NOT DETECTED Final   Streptococcus species NOT DETECTED NOT DETECTED Final   Streptococcus agalactiae NOT DETECTED NOT DETECTED Final   Streptococcus pneumoniae NOT DETECTED NOT DETECTED Final   Streptococcus  pyogenes NOT DETECTED NOT DETECTED Final   A.calcoaceticus-baumannii NOT DETECTED NOT DETECTED Final   Bacteroides fragilis NOT DETECTED NOT DETECTED  Final   Enterobacterales NOT DETECTED NOT DETECTED Final   Enterobacter cloacae complex NOT DETECTED NOT DETECTED Final   Escherichia coli NOT DETECTED NOT DETECTED Final   Klebsiella aerogenes NOT DETECTED NOT DETECTED Final   Klebsiella oxytoca NOT DETECTED NOT DETECTED Final   Klebsiella pneumoniae NOT DETECTED NOT DETECTED Final   Proteus species NOT DETECTED NOT DETECTED Final   Salmonella species NOT DETECTED NOT DETECTED Final   Serratia marcescens NOT DETECTED NOT DETECTED Final   Haemophilus influenzae NOT DETECTED NOT DETECTED Final   Neisseria meningitidis NOT DETECTED NOT DETECTED Final   Pseudomonas aeruginosa NOT DETECTED NOT DETECTED Final   Stenotrophomonas maltophilia NOT DETECTED NOT DETECTED Final   Candida albicans NOT DETECTED NOT DETECTED Final   Candida auris NOT DETECTED NOT DETECTED Final   Candida glabrata NOT DETECTED NOT DETECTED Final   Candida krusei NOT DETECTED NOT DETECTED Final   Candida parapsilosis NOT DETECTED NOT DETECTED Final   Candida tropicalis NOT DETECTED NOT DETECTED Final   Cryptococcus neoformans/gattii NOT DETECTED NOT DETECTED Final   Methicillin resistance mecA/C NOT DETECTED NOT DETECTED Final    Comment: Performed at [facility], [address], [place], [place] [address]  Culture, blood (Routine X 2) w Reflex to ID Panel     Status: None   Collection Time: 09/10/22 12:41 PM   Specimen: BLOOD  Result Value Ref Range Status   Specimen Description BLOOD  Final   Special Requests   Final    BOTTLES DRAWN AEROBIC ONLY Blood Culture adequate volume   Culture   Final    NO GROWTH 5 DAYS Performed at [facility], [place] [address] [place], [place] [address]    Report Status 09/15/2022 FINAL  Final  Culture, blood (Routine X 2) w Reflex to ID Panel     Status: None   Collection Time: 09/10/22  1:02 PM   Specimen: BLOOD  Result Value Ref Range Status   Specimen Description BLOOD BLOOD RIGHT HAND  Final   Special Requests    Final    BOTTLES DRAWN AEROBIC AND ANAEROBIC Blood Culture adequate volume   Culture   Final    NO GROWTH 5 DAYS Performed at [facility], [place] [address], [place], [place] [address]    Report Status 09/15/2022 FINAL  Final  Culture, BAL-quantitative w Gram Stain     Status: Abnormal   Collection Time: 09/13/22 12:01 PM   Specimen: Bronchoalveolar Lavage; Tracheal  Result Value Ref Range Status   Specimen Description BRONCHIAL ALVEOLAR LAVAGE  Final   Special Requests NONE  Final   Gram Stain   Final    RARE WBC PRESENT,BOTH PMN AND MONONUCLEAR NO ORGANISMS SEEN Performed at [facility], [place] [address], [place], [place] [address]    Culture 80,000 COLONIES/mL ENTEROBACTER CLOACAE (A)  Final   Report Status 09/16/2022 FINAL  Final   Organism ID, Bacteria ENTEROBACTER CLOACAE (A)  Final      Susceptibility   Enterobacter cloacae - MIC*    CEFAZOLIN >=64 RESISTANT Resistant     CEFEPIME 0.25 SENSITIVE Sensitive     CEFTAZIDIME >=64 RESISTANT Resistant     CIPROFLOXACIN <=0.25 SENSITIVE Sensitive     GENTAMICIN <=1 SENSITIVE Sensitive     IMIPENEM 0.5 SENSITIVE Sensitive     TRIMETH/SULFA <=20 SENSITIVE Sensitive  PIP/TAZO >=128 RESISTANT Resistant     * 80,000 COLONIES/mL ENTEROBACTER CLOACAE     Radiology Studies: No results found.   Pamella Pert, MD, PhD [facility]  Between 7 am - 7 pm I am available, please contact me via Amion (for emergencies) or Securechat (non urgent messages)  Between 7 pm - 7 am I am not available, please contact night coverage MD/APP via Amion

## 2022-09-18 NOTE — Plan of Care (Signed)
  Problem: Education: Goal: Knowledge of General Education information will improve Description: Including pain rating scale, medication(s)/side effects and non-pharmacologic comfort measures Outcome: Progressing   Problem: Health Behavior/Discharge Planning: Goal: Ability to manage health-related needs will improve Outcome: Progressing   Problem: Clinical Measurements: Goal: Ability to maintain clinical measurements within normal limits will improve Outcome: Progressing Goal: Will remain free from infection Outcome: Progressing Goal: Diagnostic test results will improve Outcome: Progressing Goal: Respiratory complications will improve Outcome: Progressing Goal: Cardiovascular complication will be avoided Outcome: Progressing   Problem: Activity: Goal: Risk for activity intolerance will decrease Outcome: Progressing   Problem: Nutrition: Goal: Adequate nutrition will be maintained Outcome: Progressing   Problem: Coping: Goal: Level of anxiety will decrease Outcome: Progressing   Problem: Elimination: Goal: Will not experience complications related to bowel motility Outcome: Progressing Goal: Will not experience complications related to urinary retention Outcome: Progressing   Problem: Pain Managment: Goal: General experience of comfort will improve Outcome: Progressing   Problem: Safety: Goal: Ability to remain free from injury will improve Outcome: Progressing   Problem: Skin Integrity: Goal: Risk for impaired skin integrity will decrease Outcome: Progressing   Problem: Safety: Goal: Non-violent Restraint(s) Outcome: Progressing   Problem: Education: Goal: Ability to describe self-care measures that may prevent or decrease complications (Diabetes Survival Skills Education) will improve Outcome: Progressing Goal: Individualized Educational Video(s) Outcome: Progressing   Problem: Coping: Goal: Ability to adjust to condition or change in health will  improve Outcome: Progressing   Problem: Fluid Volume: Goal: Ability to maintain a balanced intake and output will improve Outcome: Progressing   Problem: Health Behavior/Discharge Planning: Goal: Ability to identify and utilize available resources and services will improve Outcome: Progressing Goal: Ability to manage health-related needs will improve Outcome: Progressing   Problem: Metabolic: Goal: Ability to maintain appropriate glucose levels will improve Outcome: Progressing   Problem: Nutritional: Goal: Maintenance of adequate nutrition will improve Outcome: Progressing Goal: Progress toward achieving an optimal weight will improve Outcome: Progressing   Problem: Skin Integrity: Goal: Risk for impaired skin integrity will decrease Outcome: Progressing   Problem: Tissue Perfusion: Goal: Adequacy of tissue perfusion will improve Outcome: Progressing   Problem: Education: Goal: Knowledge of disease or condition will improve Outcome: Progressing Goal: Knowledge of secondary prevention will improve (SELECT ALL) Outcome: Progressing Goal: Knowledge of patient specific risk factors will improve (INDIVIDUALIZE FOR PATIENT) Outcome: Progressing Goal: Individualized Educational Video(s) Outcome: Progressing   Problem: Health Behavior/Discharge Planning: Goal: Ability to manage health-related needs will improve Outcome: Progressing   Problem: Education: Goal: Knowledge about tracheostomy care/management will improve Outcome: Progressing   Problem: Activity: Goal: Ability to tolerate increased activity will improve Outcome: Progressing   Problem: Health Behavior/Discharge Planning: Goal: Ability to manage tracheostomy will improve Outcome: Progressing   Problem: Respiratory: Goal: Patent airway maintenance will improve Outcome: Progressing   Problem: Role Relationship: Goal: Ability to communicate will improve Outcome: Progressing   Problem: Education: Goal:  Knowledge of secondary prevention will improve (SELECT ALL) Outcome: Progressing Goal: Knowledge of patient specific risk factors will improve (INDIVIDUALIZE FOR PATIENT) Outcome: Progressing   Problem: Intracerebral Hemorrhage Tissue Perfusion: Goal: Complications of Intracerebral Hemorrhage will be minimized Outcome: Progressing

## 2022-09-18 NOTE — Progress Notes (Signed)
Pt appears to be having increased WOB, and now RN reports bloody secretions from trach, sats trending slightly lower.  MD notified and ABG was ordered.  ABG sent to lab, results pending.

## 2022-09-18 NOTE — Progress Notes (Signed)
Pt condition and ABG results discussed w/ MD.  No new RT orders at this time.

## 2022-09-18 NOTE — Progress Notes (Signed)
   09/18/22 1407  Assess: MEWS Score  Temp (!) 100.9 F (38.3 C)  BP (!) 116/57  MAP (mmHg) 72  Pulse Rate 80  ECG Heart Rate 80  Resp (!) 22  Level of Consciousness Responds to Voice  SpO2 93 %  O2 Device Tracheostomy Collar  O2 Flow Rate (L/min) 10 L/min  FiO2 (%) 60 %  Assess: MEWS Score  MEWS Temp 1  MEWS Systolic 0  MEWS Pulse 0  MEWS RR 1  MEWS LOC 1  MEWS Score 3  MEWS Score Color Yellow  Treat  MEWS Interventions Administered prn meds/treatments  Pain Scale CPOT  Facial Expression 0  Body Movements 0  Muscle Tension 0  Compliance with ventilator (intubated pts.) N/A  Vocalization (extubated pts.) 0  CPOT Total 0  Notify: Provider  Provider Name/Title Gherghe  Date Provider Notified 09/18/22  Time Provider Notified 34  Method of Notification Page;Face-to-face  Notification Reason Other (Comment) (Respirations)  Provider response At bedside  Date of Provider Response 09/18/22  Time of Provider Response 1336  Document  Patient Outcome Stabilized after interventions  Progress note created (see row info) Yes  Assess: SIRS CRITERIA  SIRS Temperature  0  SIRS Pulse 0  SIRS Respirations  1  SIRS WBC 1  SIRS Score Sum  2

## 2022-09-18 NOTE — Progress Notes (Signed)
   09/18/22 1104  Assess: MEWS Score  Temp 98.3 F (36.8 C)  BP 128/63  MAP (mmHg) 82  Pulse Rate 80  ECG Heart Rate 84  Resp (!) 26  Level of Consciousness Responds to Voice  SpO2 94 %  O2 Device Tracheostomy Collar  O2 Flow Rate (L/min) 10 L/min  FiO2 (%) 60 %  Assess: MEWS Score  MEWS Temp 0  MEWS Systolic 0  MEWS Pulse 0  MEWS RR 2  MEWS LOC 1  MEWS Score 3  MEWS Score Color Yellow  Assess: if the MEWS score is Yellow or Red  Were vital signs taken at a resting state? Yes  Focused Assessment No change from prior assessment  Does the patient meet 2 or more of the SIRS criteria? Yes  Does the patient have a confirmed or suspected source of infection? No  MEWS guidelines implemented *See Row Information* Yes  Treat  MEWS Interventions Administered scheduled meds/treatments  Pain Scale CPOT  Facial Expression 0  Body Movements 0  Muscle Tension 0  Compliance with ventilator (intubated pts.) N/A  Vocalization (extubated pts.) 0  CPOT Total 0  Take Vital Signs  Increase Vital Sign Frequency  Yellow: Q 2hr X 2 then Q 4hr X 2, if remains yellow, continue Q 4hrs  Escalate  MEWS: Escalate Yellow: discuss with charge nurse/RN and consider discussing with provider and RRT  Notify: Charge Nurse/RN  Name of Charge Nurse/RN Notified Angela, RN  Date Charge Nurse/RN Notified 09/18/22  Time Charge Nurse/RN Notified 1114  Document  Patient Outcome Stabilized after interventions  Progress note created (see row info) Yes  Assess: SIRS CRITERIA  SIRS Temperature  0  SIRS Pulse 0  SIRS Respirations  1  SIRS WBC 1  SIRS Score Sum  2

## 2022-09-19 ENCOUNTER — Inpatient Hospital Stay (HOSPITAL_COMMUNITY): Payer: Medicaid Other

## 2022-09-19 LAB — BLOOD GAS, ARTERIAL
Acid-Base Excess: 2.6 mmol/L — ABNORMAL HIGH (ref 0.0–2.0)
Bicarbonate: 27.2 mmol/L (ref 20.0–28.0)
Drawn by: 137461
O2 Saturation: 85.5 %
Patient temperature: 37.1
pCO2 arterial: 41 mmHg (ref 32–48)
pH, Arterial: 7.43 (ref 7.35–7.45)
pO2, Arterial: 53 mmHg — ABNORMAL LOW (ref 83–108)

## 2022-09-19 LAB — CBC WITH DIFFERENTIAL/PLATELET
Abs Immature Granulocytes: 0.18 10*3/uL — ABNORMAL HIGH (ref 0.00–0.07)
Basophils Absolute: 0.1 10*3/uL (ref 0.0–0.1)
Basophils Relative: 0 %
Eosinophils Absolute: 0.3 10*3/uL (ref 0.0–0.5)
Eosinophils Relative: 1 %
HCT: 39.4 % (ref 39.0–52.0)
Hemoglobin: 12.1 g/dL — ABNORMAL LOW (ref 13.0–17.0)
Immature Granulocytes: 0 %
Lymphocytes Relative: 80 %
Lymphs Abs: 35.8 10*3/uL — ABNORMAL HIGH (ref 0.7–4.0)
MCH: 29.8 pg (ref 26.0–34.0)
MCHC: 30.7 g/dL (ref 30.0–36.0)
MCV: 97 fL (ref 80.0–100.0)
Monocytes Absolute: 1.7 10*3/uL — ABNORMAL HIGH (ref 0.1–1.0)
Monocytes Relative: 4 %
Neutro Abs: 6.9 10*3/uL (ref 1.7–7.7)
Neutrophils Relative %: 15 %
Platelets: 236 10*3/uL (ref 150–400)
RBC: 4.06 MIL/uL — ABNORMAL LOW (ref 4.22–5.81)
RDW: 13.2 % (ref 11.5–15.5)
WBC: 45 10*3/uL — ABNORMAL HIGH (ref 4.0–10.5)
nRBC: 0 % (ref 0.0–0.2)

## 2022-09-19 LAB — COMPREHENSIVE METABOLIC PANEL
ALT: 47 U/L — ABNORMAL HIGH (ref 0–44)
AST: 42 U/L — ABNORMAL HIGH (ref 15–41)
Albumin: 2.7 g/dL — ABNORMAL LOW (ref 3.5–5.0)
Alkaline Phosphatase: 56 U/L (ref 38–126)
Anion gap: 7 (ref 5–15)
BUN: 35 mg/dL — ABNORMAL HIGH (ref 8–23)
CO2: 23 mmol/L (ref 22–32)
Calcium: 9.1 mg/dL (ref 8.9–10.3)
Chloride: 116 mmol/L — ABNORMAL HIGH (ref 98–111)
Creatinine, Ser: 0.86 mg/dL (ref 0.61–1.24)
GFR, Estimated: >60 mL/min (ref >60)
Glucose, Bld: 131 mg/dL — ABNORMAL HIGH (ref 70–99)
Potassium: 4 mmol/L (ref 3.5–5.1)
Sodium: 146 mmol/L — ABNORMAL HIGH (ref 135–145)
Total Bilirubin: 0.5 mg/dL (ref 0.3–1.2)
Total Protein: 6.7 g/dL (ref 6.5–8.1)

## 2022-09-19 LAB — GLUCOSE, CAPILLARY
Glucose-Capillary: 110 mg/dL — ABNORMAL HIGH (ref 70–99)
Glucose-Capillary: 121 mg/dL — ABNORMAL HIGH (ref 70–99)
Glucose-Capillary: 124 mg/dL — ABNORMAL HIGH (ref 70–99)
Glucose-Capillary: 139 mg/dL — ABNORMAL HIGH (ref 70–99)
Glucose-Capillary: 149 mg/dL — ABNORMAL HIGH (ref 70–99)
Glucose-Capillary: 150 mg/dL — ABNORMAL HIGH (ref 70–99)

## 2022-09-19 LAB — PHOSPHORUS: Phosphorus: 3.7 mg/dL (ref 2.5–4.6)

## 2022-09-19 LAB — MAGNESIUM: Magnesium: 2.2 mg/dL (ref 1.7–2.4)

## 2022-09-19 MED ORDER — MIDAZOLAM HCL 2 MG/2ML IJ SOLN
2.0000 mg | Freq: Once | INTRAMUSCULAR | Status: AC
  Administered 2022-09-19: 2 mg via INTRAVENOUS

## 2022-09-19 MED ORDER — MIDAZOLAM HCL 2 MG/2ML IJ SOLN
INTRAMUSCULAR | Status: AC
  Filled 2022-09-19: qty 2

## 2022-09-19 MED ORDER — CHLORHEXIDINE GLUCONATE CLOTH 2 % EX PADS
6.0000 | MEDICATED_PAD | Freq: Every day | CUTANEOUS | Status: DC
  Administered 2022-09-19 – 2022-09-25 (×8): 6 via TOPICAL

## 2022-09-19 MED ORDER — FENTANYL CITRATE PF 50 MCG/ML IJ SOSY
PREFILLED_SYRINGE | INTRAMUSCULAR | Status: AC
  Filled 2022-09-19: qty 1

## 2022-09-19 MED ORDER — FENTANYL CITRATE PF 50 MCG/ML IJ SOSY
50.0000 ug | PREFILLED_SYRINGE | Freq: Once | INTRAMUSCULAR | Status: AC
  Administered 2022-09-19: 50 ug via INTRAVENOUS

## 2022-09-19 MED ORDER — FENTANYL CITRATE PF 50 MCG/ML IJ SOSY: 12.5000 ug | PREFILLED_SYRINGE | INTRAMUSCULAR | Status: DC | PRN

## 2022-09-19 NOTE — Progress Notes (Signed)
[place] Progress Note Patient Name: Dustin Jones DOB: 05/20/1961 MRN: [id number]   Date of Service  09/19/2022  HPI/Events of Note  Agitation - Nursing request for Fentanyl IV PRN.   eICU Interventions  Plan: Fentanyl 12.5 - 50 mcg IV Q 2 hours PRN severe pain or agitation.      Intervention Category Major Interventions: Delirium, psychosis, severe agitation - evaluation and management  Cobi Delph Eugene 09/19/2022, 9:00 PM

## 2022-09-19 NOTE — Progress Notes (Signed)
PROGRESS NOTE  Dustin Jones PJA:[id number] DOB: [birth year] DOA: 09/19/2022 PCP: Marliss Coots, NP   LOS: 18 days   Brief Narrative / Interim history: 61 year old man with history of prior DVT, EtOH, tobacco use was brought into the hospital on 9/28 as a code stroke.  He has left-sided deficits, was hypertensive with a systolic in the 341P, and CT head on admission demonstrated acute intraparenchymal hemorrhage, and findings suspicious for ruptured aneurysm.  Significant events: 9/28 admitted. Intracranial Hemorrhage/ R MCA ruptured aneurysm Non-con CT of head demonstrating large acute hemorrhage in the R lentiform nucleus with with intraventricular extension, midline shift, and slight rounding of the temporal horns, possibly representing early hydrocephalus. On CTA, pt found to have a R MCA ruptured aneurysm. Neurosurgery consulted and following with plans for possible arterial clipping this evening. CT of the face and cervical spine did not show any acute traumatic injuries, though BL cervical lymphadenopathy was noted. Echo with normal biventricular function without evidence of hemodynamically sig valvular heart disease.  9/28 complete echo with LVEF of 60-65%, no regional wall motion abnormalities but with grade 1 diastolic dysfunction.  Normal right ventricular systolic function.  No valvular abnormalities. 9/29 MRI brain 1. Large acute intraparenchymal hemorrhage centered in the rightlentiform nucleus, with extension into the right-greater-than-left lateral ventricle, third ventricle, and fourth ventricle, overall similar to the prior CT. Mild mass effect and 5 mm of right-to-left midline shift. No hydrocephalus. 2. Small areas of acute infarct in the bilateral occipital lobes, bilateral frontal lobes, and right parietal lobe. Spiking fever. Has sig leukocytosis. Cultures sent. Unasyn started for aspiration  9/29 respiratory culture positive for MSSA 9/29 self extubated, required  reintubation 10/2 Family identified  10/6 bronchospasm, steroids of this, bronc with clear lungs, ETT 10/9 Neuro exam/mental status remains poor. Coming down on vent requirements. BP improved, remains off of Cleviprex. D/w family re: tracheostomy. 10/10 Remains hypertensive, agitation. Sedation adjusted (Precedex, Fentanyl/Versed PRN). Increased peak pressures on vent. CXR worse with asymmetric pulmonary edema. Lasix given. Trach with CCM. 10/11 Cortrak placed 10/11 ID consulted, abx changed from Cefazolin to Cefepime 10/13 WBC up to 56 (was 49 day prior). CT chest/abd/pelv ordered. 10/15-transferred to the hospitalist service  Subjective / 24h Interval events: Does not appear to be in distress.  Getting a breathing treatment with RT at bedside.  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  Overall has remained stable, most recent CT scan of the head was on 10/5 which showed no significant change in size and morphology of the intraparenchymal hemorrhage right basal ganglia, has surrounding vasogenic edema and a 5 mm of right-to-left shift which was similar.  Intraventricular blood in the lateral ventricles were decreased. -He was also found to have small areas of acute infarcts in bilateral occipital lobes, bilateral frontal lobes and right parietal lobe.  Neurology followed patient while  hospitalized -Continue to hold all antiplatelet agents, blood thinners -Difficult to tell prognosis  at this point  Active problems  Hypertension-continue home medications with amlodipine, clonidine, lisinopril, HCTZ.  Goal blood pressure less than 160.  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 CT chest a/p negative for malignancy, infection  -Continue to monitor WBC periodically  Acute hypoxemic respiratory failure-In the setting of encephalopathy and mild volume overload as well as PNA as below. -Continue trach, bronchodilators, pulmonary hygiene  MSSA pneumonia status post treatment, BAL 10/10 growing Enterobacter Aerogenes -ID consulted, continue cefepime, end date 10/18.  He had some hemoptysis yesterday, cleared this morning  S/p tracheostomy 10/11-PCCM will follow tracheostomy  Hypernatremia-likely in the setting of salt load with IV fluids as well as diuresis.  Continue free water flushes, monitor.  Sodium improving   DM2- A1C 6.5%, continue SSI  CBG (last 3)  Recent Labs    09/18/22 2354 09/19/22 0413 09/19/22 0816  GLUCAP 117* 124* 110*     Scheduled Meds:  amLODipine  10 mg Per Tube Daily   arformoterol  15 mcg Nebulization BID   clonazepam  0.5 mg Per Tube BID   cloNIDine  0.3 mg Per Tube TID   docusate  100 mg Per Tube BID   feeding supplement (PROSource TF20)  60 mL Per Tube BID   fiber supplement (BANATROL TF)  60 mL Per Tube BID   free water  300 mL Per Tube Q4H   heparin injection (subcutaneous)  5,000 Units Subcutaneous Q8H   hydrochlorothiazide  25 mg Per Tube Daily   insulin aspart  0-15 Units Subcutaneous Q4H   lisinopril  40 mg Per Tube Daily   niMODipine  60 mg Oral Q4H   Or   niMODipine  60 mg Per Tube Q4H   mouth rinse  15 mL  Mouth Rinse Q2H   pantoprazole  40 mg Per Tube Daily   polyethylene glycol  17 g Per Tube Daily   revefenacin  175 mcg Nebulization Daily   Continuous Infusions:  ceFEPime (MAXIPIME) IV Stopped (09/19/22 0558)   feeding supplement (OSMOLITE 1.5 CAL) 55 mL/hr at 09/19/22 0906   PRN Meds:.acetaminophen **OR** acetaminophen (TYLENOL) oral liquid 160 mg/5 mL **OR** acetaminophen, albuterol, mouth rinse, oxyCODONE  Current Outpatient Medications  Medication Instructions   cloNIDine (CATAPRES) 0.3 mg, Oral, 2 times daily   cyclobenzaprine (FLEXERIL) 10 mg, Oral, At bedtime PRN   hydrALAZINE (APRESOLINE) 100 mg, Oral, 3 times daily   lisinopril (ZESTRIL) 40 mg, Oral, Daily   meloxicam (MOBIC) 15 mg, Oral, Daily PRN   simvastatin (ZOCOR) 20 mg, Oral, Daily at bedtime    Diet Orders (From admission, onward)     Start     Ordered   09/13/22 1121  Diet NPO time specified  (Pre Percutaneous Dilitation Tracheostomy)  Diet effective now       Comments: Clarify time of tube feed discontinuation with provider.   09/13/22 1120            DVT prophylaxis: heparin injection 5,000 Units Start: 09/03/22 1400 SCD's Start: 09/23/2022 0930   Lab Results  Component Value Date   PLT 236 09/19/2022      Code Status: Full Code  Family Communication: No family at bedside  Status is: Inpatient  Remains inpatient appropriate because: Severity of illness  Level of care: Progressive  Consultants:  ID PCCM  Objective:  Vitals:   09/19/22 0409 09/19/22 0749 09/19/22 0815 09/19/22 1020  BP: (!) 149/90  (!) 156/95   Pulse: 79 78 75 79  Resp: 20 (!) 22 20 (!) 22  Temp: 97.9 F (36.6 C)  99.4 F (37.4 C)   TempSrc: Axillary  Axillary   SpO2: 94% 95%  93%  Weight:      Height:        Intake/Output Summary (Last 24 hours) at 09/19/2022 1114 Last data filed at 09/19/2022 0845 Gross per 24 hour  Intake 1500 ml  Output 1900 ml  Net -400 ml    Wt Readings from Last 3 Encounters:   09/18/22 123.1 kg    Examination:  Constitutional: NAD Eyes: lids and conjunctivae normal, no scleral icterus ENMT: mmm Neck: normal, supple Respiratory: Tachypneic, abdominal breathing.  No wheezing Cardiovascular: Regular rate and rhythm, no murmurs / rubs / gallops. No LE edema. Abdomen: soft, no distention, no tenderness. Bowel sounds positive.  Skin: no rashes Neurologic: no focal deficits, equal strength   Data Reviewed: I have independently reviewed following labs and imaging studies   CBC Recent Labs  Lab 09/15/22 0444 09/15/22 0457 09/16/22 0635 09/17/22 0736 09/18/22 0719 09/19/22 0451  WBC 49.2*  --  55.9* 48.4* 47.7* 45.0*  HGB 12.5* 12.2* 13.1 13.3 12.3* 12.1*  HCT 38.7* 36.0* 42.7 42.8 39.1 39.4  PLT 211  --  230 230 262 236  MCV 95.3  --  97.5 97.5 95.8 97.0  MCH 30.8  --  29.9 30.3 30.1 29.8  MCHC 32.3  --  30.7 31.1 31.5 30.7  RDW 13.4  --  13.6 13.2 13.2 13.2  LYMPHSABS 36.9*  --  43.3* 38.4* 36.5* 35.8*  MONOABS 0.0*  --  1.9* 1.6* 2.9* 1.7*  EOSABS 0.0  --  0.3 0.3 0.4 0.3  BASOSABS 0.0  --  0.1 0.2* 0.1 0.1     Recent Labs  Lab 09/14/22 0337 09/15/22 0444 09/15/22 0457 09/16/22 0635 09/17/22 0736 09/19/22 0451  NA 153* 149* 149* 148* 148* 146*  K 4.4 3.6 3.5 4.3 3.9 4.0  CL 119* 114*  --  116* 116* 116*  CO2 26 23  --  23 23 23   GLUCOSE 145* 191*  --  117* 140* 131*  BUN 40* 35*  --  30* 30* 35*  CREATININE 1.02 0.80  --  0.80 0.80 0.86  CALCIUM 9.4 9.4  --  8.7* 9.2 9.1  AST  --   --   --   --   --  42*  ALT  --   --   --   --   --  47*  ALKPHOS  --   --   --   --   --  56  BILITOT  --   --   --   --   --  0.5  ALBUMIN  --   --   --   --   --  2.7*  MG 2.4 2.4  --  2.2 2.3 2.2     ------------------------------------------------------------------------------------------------------------------ No results for input(s): "CHOL", "HDL", "LDLCALC", "TRIG", "CHOLHDL", "LDLDIRECT" in the last 72 hours.  Lab Results  Component  Value Date   HGBA1C 6.5 (H) May 03, 2022   ------------------------------------------------------------------------------------------------------------------ No results for input(s): "TSH", "T4TOTAL", "T3FREE", "THYROIDAB" in the last 72 hours.  Invalid input(s): "FREET3"  Cardiac Enzymes No results for input(s): "CKMB", "TROPONINI", "MYOGLOBIN" in the last 168 hours.  Invalid input(s): "CK" ------------------------------------------------------------------------------------------------------------------ No results found for: "BNP"  CBG: Recent Labs  Lab 09/18/22 1716 09/18/22 1945 09/18/22 2354 09/19/22 0413 09/19/22 0816  GLUCAP 119* 149* 117* 124* 110*     Recent Results (from the past 240 hour(s))  Culture, blood (Routine X 2) w Reflex to ID Panel     Status: None   Collection Time: 09/10/22 12:41 PM   Specimen: BLOOD  Result Value Ref Range Status   Specimen Description BLOOD  Final   Special Requests   Final    BOTTLES DRAWN AEROBIC ONLY Blood Culture adequate volume   Culture   Final    NO GROWTH 5 DAYS Performed at [facility], [address] [place], [place] [address]    Report Status 09/15/2022 FINAL  Final  Culture, blood (Routine X 2) w Reflex to ID Panel     Status: None   Collection Time: 09/10/22  1:02 PM   Specimen: BLOOD  Result Value Ref Range Status   Specimen Description BLOOD BLOOD RIGHT HAND  Final   Special Requests   Final    BOTTLES DRAWN AEROBIC AND ANAEROBIC Blood Culture adequate volume   Culture   Final    NO GROWTH 5 DAYS Performed at [facility], [address] [place], [place] [address]    Report Status 09/15/2022 FINAL  Final  Culture, BAL-quantitative w Gram Stain     Status: Abnormal   Collection Time: 09/13/22 12:01 PM   Specimen: Bronchoalveolar Lavage; Tracheal  Result Value Ref Range Status   Specimen Description BRONCHIAL ALVEOLAR LAVAGE  Final   Special Requests NONE  Final   Gram Stain   Final    RARE  WBC PRESENT,BOTH PMN AND MONONUCLEAR NO ORGANISMS SEEN Performed at [facility], [address] [place], [place] [address]    Culture 80,000 COLONIES/mL ENTEROBACTER CLOACAE (A)  Final   Report Status 09/16/2022 FINAL  Final   Organism ID, Bacteria ENTEROBACTER CLOACAE (A)  Final      Susceptibility   Enterobacter cloacae - MIC*    CEFAZOLIN >=64 RESISTANT Resistant     CEFEPIME 0.25 SENSITIVE Sensitive     CEFTAZIDIME >=64 RESISTANT Resistant     CIPROFLOXACIN <=0.25 SENSITIVE Sensitive     GENTAMICIN <=1 SENSITIVE Sensitive     IMIPENEM 0.5 SENSITIVE Sensitive     TRIMETH/SULFA <=20 SENSITIVE Sensitive     PIP/TAZO >=128 RESISTANT Resistant     * 80,000 COLONIES/mL ENTEROBACTER CLOACAE     Radiology Studies: CT HEAD WO CONTRAST ( )  Result Date: 09/19/2022 CLINICAL DATA:  Altered mental status.  Hemorrhage follow-up EXAM: CT HEAD WITHOUT CONTRAST TECHNIQUE: Contiguous axial images were obtained from the base of the skull through the vertex without intravenous contrast. RADIATION DOSE REDUCTION: This exam was performed according to the departmental dose-optimization program which includes automated exposure control, adjustment of the mA and/or kV according to patient size and/or use of iterative reconstruction technique. COMPARISON:  09/08/2022 FINDINGS: Brain: Expected evolution of intraparenchymal hemorrhage centered in the right basal ganglia with decreased size of the hyperdense component and mildly increased surrounding edema. Decreased amount of acute blood in the dependent lateral ventricles. There is leftward midline shift 4 mm. No new site of hemorrhage. Vascular: Coil mass near the right ICA terminus. Skull: Normal. Negative for fracture or focal lesion. Sinuses/Orbits: No acute finding. Other: None. IMPRESSION: 1. Expected evolution of intraparenchymal hemorrhage centered in the right basal ganglia with no acute abnormality. 2. Decreased amount of acute blood in the  dependent lateral  ventricles. 3. Leftward midline shift 4 mm. Electronically Signed   By: Deatra Robinson M.D.   On: 09/19/2022 01:30   DG Abd Portable 1V  Result Date: 09/18/2022 CLINICAL DATA:  Fever EXAM: PORTABLE ABDOMEN - 1 VIEW COMPARISON:  Abdominal x-ray 09/14/2022 FINDINGS: The tip of the feeding tube is at the level of the gastric antrum. No dilated bowel loops are seen. There are phleboliths in the pelvis. No suspicious calcifications overlie the kidneys. No gross fracture. IMPRESSION: The tip of the feeding tube is at the level of the gastric antrum. Evidence for bowel obstruction. Electronically Signed   By: Darliss Cheney M.D.   On: 09/18/2022 15:13   DG CHEST PORT 1 VIEW  Result Date: 09/18/2022 CLINICAL DATA:  Fever EXAM: PORTABLE CHEST 1 VIEW COMPARISON:  Chest x-ray 09/13/2022 FINDINGS: The tip of the tracheostomy is at the level of the clavicular heads, unchanged. Enteric tube extends below the diaphragm. The cardiomediastinal silhouette is unchanged, the heart is enlarged. There central pulmonary vascular congestion. There are patchy airspace opacities in both lower lungs which have decreased on the left, but increased on the right. Left pleural effusion has resolved. No pneumothorax or acute fracture. IMPRESSION: 1. Patchy airspace opacities in both lower lungs, decreased on the left, but increased on the right. Findings may reflect edema or infection. 2. Resolved left pleural effusion. Electronically Signed   By: Darliss Cheney M.D.   On: 09/18/2022 15:09     Pamella Pert, MD, PhD [facility]  Between 7 am - 7 pm I am available, please contact me via Amion (for emergencies) or Securechat (non urgent messages)  Between 7 pm - 7 am I am not available, please contact night coverage MD/APP via Amion

## 2022-09-19 NOTE — Procedures (Signed)
Bedside Bronchoscopy Procedure Note Dustin Jones [id number] 11/02/1961  Procedure: Bronchoscopy Indications: Diagnostic evaluation of the airways, Obtain specimens for culture and/or other diagnostic studies, and Remove secretions  Procedure Details: ET Tube Size: ET Tube secured at lip (cm): Bite block in place: No In preparation for procedure, Patient hyper-oxygenated with 100 % FiO2 Airway entered and the following bronchi were examined: RUL, RML, RLL, LUL, and LLL.   Bronchoscope removed.  , Patient placed back on 100% FiO2 at conclusion of procedure.    Evaluation BP (!) 136/93   Pulse 79   Temp 99.6 F (37.6 C) (Axillary)   Resp (!) 34   Ht 5\' 7"  (1.702 m)   Wt 123.1 kg   SpO2 100%   BMI 42.51 kg/m  Breath Sounds:Clear and Diminished O2 sats: stable throughout Patient's Current Condition: stable Specimens:  Sent serosanguinous fluid Complications: No apparent complications Patient did tolerate procedure well.   [name] 09/19/2022, 4:43 PM

## 2022-09-19 NOTE — Progress Notes (Signed)
Pt transported to 100m 10 to be placed on ventilator.  Hand off report given to ICU respiratory therapist.

## 2022-09-19 NOTE — Procedures (Signed)
Bronchoscopy Procedure Note  ONIX JUMPER  [id number]  [birth year]  Date:09/19/22  Time:4:37 PM   Provider Performing:Birtha Hatler Posey Pronto   Procedure(s):  Flexible Bronchoscopy [phone])  Indication(s) Pneumonia   Consent Risks of the procedure as well as the alternatives and risks of each were explained to the patient and/or caregiver.  Consent for the procedure was obtained and is signed in the bedside chart  Anesthesia No general anesthetisia, but patient was given 2 of versed    Time Out Verified patient identification, verified procedure, site/side was marked, verified correct patient position, special equipment/implants available, medications/allergies/relevant history reviewed, required imaging and test results available.   Sterile Technique Usual hand hygiene, masks, gowns, and gloves were used   Procedure Description Bronchoscope advanced through tracheostomy tube and into airway.  Airways were examined down to subsegmental level with findings noted below.   Following diagnostic evaluation, BAL(s) performed in Right middle lobe with normal saline and return of clear fluid with particles.  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 Chest X-ray is not needed post procedure.   EBL Minimal   Specimen(s) BAL obtained

## 2022-09-19 NOTE — Progress Notes (Signed)
Pt transferred from 3W to 80M ICU to be placed on full support ventilation.

## 2022-09-19 NOTE — Progress Notes (Signed)
Pt noted to have decreased o2 saturations with unreliable pleth noted on the monitor.  Changed o2 sat probe, suctioned patient and increased fio2 with no change in o2 saturations.  Dr. Renne Crigler and Dr. Tamala Julian notified and orders received to check ABG.  ABG drawn and sent to the lab.  Will continue to monitor patient.

## 2022-09-19 NOTE — Progress Notes (Signed)
   09/19/22 1448  Assess: MEWS Score  Temp 98.8 F (37.1 C)  BP (!) 150/89  MAP (mmHg) 106  Pulse Rate 80  ECG Heart Rate 80  Resp (!) 22  Level of Consciousness Responds to Voice  SpO2 (!) 81 %  O2 Device Tracheostomy Collar  Assess: MEWS Score  MEWS Temp 0  MEWS Systolic 0  MEWS Pulse 0  MEWS RR 1  MEWS LOC 1  MEWS Score 2  MEWS Score Color Yellow  Assess: if the MEWS score is Yellow or Red  Were vital signs taken at a resting state? Yes  Focused Assessment Change from prior assessment (see assessment flowsheet)  Does the patient meet 2 or more of the SIRS criteria? Yes  Provider and Rapid Response Notified? Yes  MEWS guidelines implemented *See Row Information* Yes  Treat  MEWS Interventions Escalated (See documentation below)  Take Vital Signs  Increase Vital Sign Frequency  Yellow: Q 2hr X 2 then Q 4hr X 2, if remains yellow, continue Q 4hrs  Escalate  MEWS: Escalate Yellow: discuss with charge nurse/RN and consider discussing with provider and RRT  Notify: Charge Nurse/RN  Name of Charge Nurse/RN Notified Levada Dy  Date Charge Nurse/RN Notified 09/19/22  Time Charge Nurse/RN Notified 1453  Notify: Provider  Provider Name/Title Repton  Date Provider Notified 09/19/22  Time Provider Notified 1445  Method of Notification Page;Face-to-face  Notification Reason Change in status  Provider response At bedside  Date of Provider Response 09/19/22  Time of Provider Response 1445  Notify: Rapid Response  Name of Rapid Response RN Notified Hella  Date Rapid Response Notified 09/19/22  Time Rapid Response Notified 5465  Document  Patient Outcome Transferred/level of care increased  Progress note created (see row info) Yes  Assess: SIRS CRITERIA  SIRS Temperature  0  SIRS Pulse 0  SIRS Respirations  1  SIRS WBC 1  SIRS Score Sum  2

## 2022-09-19 NOTE — Plan of Care (Signed)
  Problem: Education: Goal: Knowledge of General Education information will improve Description: Including pain rating scale, medication(s)/side effects and non-pharmacologic comfort measures Outcome: Progressing   Problem: Health Behavior/Discharge Planning: Goal: Ability to manage health-related needs will improve Outcome: Progressing   Problem: Clinical Measurements: Goal: Ability to maintain clinical measurements within normal limits will improve Outcome: Progressing Goal: Will remain free from infection Outcome: Progressing Goal: Diagnostic test results will improve Outcome: Progressing Goal: Respiratory complications will improve Outcome: Progressing Goal: Cardiovascular complication will be avoided Outcome: Progressing   Problem: Activity: Goal: Risk for activity intolerance will decrease Outcome: Progressing   Problem: Nutrition: Goal: Adequate nutrition will be maintained Outcome: Progressing   Problem: Coping: Goal: Level of anxiety will decrease Outcome: Progressing   Problem: Elimination: Goal: Will not experience complications related to bowel motility Outcome: Progressing Goal: Will not experience complications related to urinary retention Outcome: Progressing   Problem: Pain Managment: Goal: General experience of comfort will improve Outcome: Progressing   Problem: Safety: Goal: Ability to remain free from injury will improve Outcome: Progressing   Problem: Skin Integrity: Goal: Risk for impaired skin integrity will decrease Outcome: Progressing   Problem: Safety: Goal: Non-violent Restraint(s) Outcome: Progressing   Problem: Education: Goal: Ability to describe self-care measures that may prevent or decrease complications (Diabetes Survival Skills Education) will improve Outcome: Progressing Goal: Individualized Educational Video(s) Outcome: Progressing   Problem: Coping: Goal: Ability to adjust to condition or change in health will  improve Outcome: Progressing   Problem: Fluid Volume: Goal: Ability to maintain a balanced intake and output will improve Outcome: Progressing   Problem: Health Behavior/Discharge Planning: Goal: Ability to identify and utilize available resources and services will improve Outcome: Progressing Goal: Ability to manage health-related needs will improve Outcome: Progressing   Problem: Metabolic: Goal: Ability to maintain appropriate glucose levels will improve Outcome: Progressing   Problem: Nutritional: Goal: Maintenance of adequate nutrition will improve Outcome: Progressing Goal: Progress toward achieving an optimal weight will improve Outcome: Progressing   Problem: Skin Integrity: Goal: Risk for impaired skin integrity will decrease Outcome: Progressing   Problem: Tissue Perfusion: Goal: Adequacy of tissue perfusion will improve Outcome: Progressing   Problem: Education: Goal: Knowledge of disease or condition will improve Outcome: Progressing Goal: Knowledge of secondary prevention will improve (SELECT ALL) Outcome: Progressing Goal: Knowledge of patient specific risk factors will improve (INDIVIDUALIZE FOR PATIENT) Outcome: Progressing Goal: Individualized Educational Video(s) Outcome: Progressing   Problem: Health Behavior/Discharge Planning: Goal: Ability to manage health-related needs will improve Outcome: Progressing   Problem: Education: Goal: Knowledge about tracheostomy care/management will improve Outcome: Progressing   Problem: Activity: Goal: Ability to tolerate increased activity will improve Outcome: Progressing   Problem: Health Behavior/Discharge Planning: Goal: Ability to manage tracheostomy will improve Outcome: Progressing   Problem: Respiratory: Goal: Patent airway maintenance will improve Outcome: Progressing   Problem: Role Relationship: Goal: Ability to communicate will improve Outcome: Progressing   Problem: Education: Goal:  Knowledge of secondary prevention will improve (SELECT ALL) Outcome: Progressing Goal: Knowledge of patient specific risk factors will improve (INDIVIDUALIZE FOR PATIENT) Outcome: Progressing   Problem: Intracerebral Hemorrhage Tissue Perfusion: Goal: Complications of Intracerebral Hemorrhage will be minimized Outcome: Progressing   

## 2022-09-19 NOTE — Progress Notes (Signed)
09/19/2022  Called back for worsening resp status.  Worsening rhonci, copious secretions, obtunded.  Warrants going back on vent and therapeutic/diagnostic bronchoscopy.  Will take over Primary to reach out to family.  32 min cc time Erskine Emery MD PCCM

## 2022-09-19 NOTE — Progress Notes (Signed)
[facility] Progress Note Patient Name: Dustin Jones DOB: 1961-11-09 MRN: [id number]   Date of Service  09/19/2022  HPI/Events of Note  Patient is currently on 80%/PEEP 8 with sat = 94%. Ventilator orders are for PEEP = 5.  eICU Interventions  Plan: Change ventilator orders to reflect PEEP 8.     Intervention Category Major Interventions: Respiratory failure - evaluation and management  Ellianah Cordy Eugene 09/19/2022, 8:22 PM

## 2022-09-19 NOTE — Progress Notes (Signed)
Improved mentation and resp status on vent. BAL sent but looks like probable aspiration. Low threshold to start abx if any fevers.  Erskine Emery md PCCM

## 2022-09-19 NOTE — TOC Progression Note (Signed)
Transition of Care [facility]) - Progression Note    Patient Details  Name: Dustin Jones MRN: [id number] Date of Birth: 04-20-1961  Transition of Care [facility]) CM/SW Contact  Pollie Friar, RN Phone Number: 09/19/2022, 2:34 PM  Clinical Narrative:    CM sent message to [employer] to see about medicaid for patient as he is going to need placement.  Pt continues with trach and cortrak.  TOC following.        Expected Discharge Plan and Services                                                 Social Determinants of Health (SDOH) Interventions    Readmission Risk Interventions     No data to display

## 2022-09-19 NOTE — Progress Notes (Signed)
This nurse did not receive any message regarding scheduled head CT, neither by secure chat nor telephone.

## 2022-09-19 NOTE — Progress Notes (Signed)
NAME:  Dustin Jones, MRN:  [id number], DOB:  09/12/1961, LOS: 16 ADMISSION DATE:  2022/09/28, CONSULTATION DATE:  28-Sep-2022 REFERRING MD:  Maryan Rued - EDP CHIEF COMPLAINT:  Intracranial hemorrhage   History of Present Illness:  61 year old man who presented to [facility] 9/28 as a Code Stroke. PMHx significant for HTN, DVT and chronic tobacco use.  LKW ~0300 9/28. Found down ~0800 by bystanders who called 911. EMS reported the patient had L-sided deficits and AMS. Systolic BP reported to be in the 190s per EMS. CT Head demonstrated acute intraparenchymal hemorrhage with possible lobulated 33mm outpouching arising from the supraclinoid right ICA, suspicious for ruptured aneurysm. Admitted by Neurosurgery for close monitoring and further diagnostic testing.   PCCM was consulted for ventilator management.   Pertinent Medical History:  Hypertension History of DVT Hx Etoh use, not quantified HX of Tobacco use, > 25 pack years  Significant Hospital Events: Including procedures, antibiotic start and stop dates in addition to other pertinent events   9/28 admitted. Intracranial Hemorrhage/ R MCA ruptured aneurysm Non-con CT of head demonstrating large acute hemorrhage in the R lentiform nucleus with with intraventricular extension, midline shift, and slight rounding of the temporal horns, possibly representing early hydrocephalus. On CTA, pt found to have a R MCA ruptured aneurysm. Neurosurgery consulted and following with plans for possible arterial clipping this evening. CT of the face and cervical spine did not show any acute traumatic injuries, though BL cervical lymphadenopathy was noted. Echo with normal biventricular function without evidence of hemodynamically sig valvular heart disease.  9/28 complete echo with LVEF of 60-65%, no regional wall motion abnormalities but with grade 1 diastolic dysfunction.  Normal right ventricular systolic function.  No valvular abnormalities. 9/29 MRI brain 1. Large  acute intraparenchymal hemorrhage centered in the rightlentiform nucleus, with extension into the right-greater-than-left lateral ventricle, third ventricle, and fourth ventricle, overall similar to the prior CT. Mild mass effect and 5 mm of right-to-left midline shift. No hydrocephalus. 2. Small areas of acute infarct in the bilateral occipital lobes, bilateral frontal lobes, and right parietal lobe. Spiking fever. Has sig leukocytosis. Cultures sent. Unasyn started for aspiration  9/29 respiratory culture positive for MSSA 9/29 self extubated, required reintubation 10/2 Family identified  10/6 bronchospasm, steroids of this, bronc with clear lungs, ETT 10/9 Neuro exam/mental status remains poor. Coming down on vent requirements. BP improved, remains off of Cleviprex. D/w family re: tracheostomy. 10/10 Remains hypertensive, agitation. Sedation adjusted (Precedex, Fentanyl/Versed PRN). Increased peak pressures on vent. CXR worse with asymmetric pulmonary edema. Lasix given. Trach with CCM. 10/11 Cortrak placed 10/11 ID consulted, abx changed from Cefazolin to Cefepime 10/13 WBC up to 56 (was 49 day prior). CT chest/abd/pelv ordered.  Interim History / Subjective:  Labored breathing pattern but this is stable from yesterday.  Objective:  Blood pressure (!) 156/95, pulse 79, temperature 99.4 F (37.4 C), temperature source Axillary, resp. rate (!) 22, height 5\' 7"  (1.702 m), weight 123.1 kg, SpO2 93 %.    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 Hypernatremia:  Acute hypoxemic respiratory failure: In the setting of encephalopathy and mild volume overload as well as PNA as below. MSSA pneumonia status post treatment, BAL 10/10 growing Enterobacter Aerogenes. S/p tracheostomy 10/11 Bronchospasm  His CT looks like he may have some underlying bronchomalacia that with his CNS insult he is unable to compensate for.  Hopefully he settles out but may need to return to ICU for PEEP if worsening resp status.  Will follow with you. Continue abx and aggressive pulmonary toileting. Remainder of care per primary.  Discussed with Dr. Elvera Lennox.   Best Practice: (right click and "Reselect all SmartList Selections" daily)  Per primary  Myrla Halsted MD PCCM After 7:00 pm call Elink

## 2022-09-20 DIAGNOSIS — Z930 Tracheostomy status: Secondary | ICD-10-CM

## 2022-09-20 LAB — CBC WITH DIFFERENTIAL/PLATELET
Abs Immature Granulocytes: 0.27 10*3/uL — ABNORMAL HIGH (ref 0.00–0.07)
Abs Immature Granulocytes: 0.18 10*3/uL — ABNORMAL HIGH (ref 0.00–0.07)
Basophils Absolute: 0.1 10*3/uL (ref 0.0–0.1)
Basophils Absolute: 0.1 10*3/uL (ref 0.0–0.1)
Basophils Relative: 0 %
Basophils Relative: 0 %
Eosinophils Absolute: 0.4 10*3/uL (ref 0.0–0.5)
Eosinophils Absolute: 0.4 10*3/uL (ref 0.0–0.5)
Eosinophils Relative: 1 %
Eosinophils Relative: 1 %
HCT: 40.5 % (ref 39.0–52.0)
HCT: 39 % (ref 39.0–52.0)
Hemoglobin: 12.7 g/dL — ABNORMAL LOW (ref 13.0–17.0)
Hemoglobin: 12.3 g/dL — ABNORMAL LOW (ref 13.0–17.0)
Immature Granulocytes: 1 %
Immature Granulocytes: 0 %
Lymphocytes Relative: 80 %
Lymphocytes Relative: 78 %
Lymphs Abs: 38.5 10*3/uL — ABNORMAL HIGH (ref 0.7–4.0)
Lymphs Abs: 31.9 10*3/uL — ABNORMAL HIGH (ref 0.7–4.0)
MCH: 30.5 pg (ref 26.0–34.0)
MCH: 30 pg (ref 26.0–34.0)
MCHC: 31.4 g/dL (ref 30.0–36.0)
MCHC: 31.5 g/dL (ref 30.0–36.0)
MCV: 97.4 fL (ref 80.0–100.0)
MCV: 95.1 fL (ref 80.0–100.0)
Monocytes Absolute: 0.8 10*3/uL (ref 0.1–1.0)
Monocytes Absolute: 2.3 10*3/uL — ABNORMAL HIGH (ref 0.1–1.0)
Monocytes Relative: 2 %
Monocytes Relative: 5 %
Neutro Abs: 7.6 10*3/uL (ref 1.7–7.7)
Neutro Abs: 6.6 10*3/uL (ref 1.7–7.7)
Neutrophils Relative %: 16 %
Neutrophils Relative %: 16 %
Platelets: 125 10*3/uL — ABNORMAL LOW (ref 150–400)
Platelets: 199 10*3/uL (ref 150–400)
RBC: 4.16 MIL/uL — ABNORMAL LOW (ref 4.22–5.81)
RBC: 4.1 MIL/uL — ABNORMAL LOW (ref 4.22–5.81)
RDW: 13.2 % (ref 11.5–15.5)
RDW: 13.1 % (ref 11.5–15.5)
WBC: 47.6 10*3/uL — ABNORMAL HIGH (ref 4.0–10.5)
WBC: 41.3 10*3/uL — ABNORMAL HIGH (ref 4.0–10.5)
nRBC: 0 % (ref 0.0–0.2)
nRBC: 0 % (ref 0.0–0.2)

## 2022-09-20 LAB — BASIC METABOLIC PANEL
Anion gap: 8 (ref 5–15)
BUN: 43 mg/dL — ABNORMAL HIGH (ref 8–23)
CO2: 18 mmol/L — ABNORMAL LOW (ref 22–32)
Calcium: 8.9 mg/dL (ref 8.9–10.3)
Chloride: 115 mmol/L — ABNORMAL HIGH (ref 98–111)
Creatinine, Ser: 1.01 mg/dL (ref 0.61–1.24)
GFR, Estimated: >60 mL/min (ref >60)
Glucose, Bld: 125 mg/dL — ABNORMAL HIGH (ref 70–99)
Potassium: 5.6 mmol/L — ABNORMAL HIGH (ref 3.5–5.1)
Sodium: 141 mmol/L (ref 135–145)

## 2022-09-20 LAB — GLUCOSE, CAPILLARY
Glucose-Capillary: 111 mg/dL — ABNORMAL HIGH (ref 70–99)
Glucose-Capillary: 126 mg/dL — ABNORMAL HIGH (ref 70–99)
Glucose-Capillary: 146 mg/dL — ABNORMAL HIGH (ref 70–99)
Glucose-Capillary: 153 mg/dL — ABNORMAL HIGH (ref 70–99)
Glucose-Capillary: 156 mg/dL — ABNORMAL HIGH (ref 70–99)
Glucose-Capillary: 165 mg/dL — ABNORMAL HIGH (ref 70–99)

## 2022-09-20 LAB — LACTIC ACID, PLASMA: Lactic Acid, Venous: 1.2 mmol/L (ref 0.5–1.9)

## 2022-09-20 LAB — MAGNESIUM: Magnesium: 2.2 mg/dL (ref 1.7–2.4)

## 2022-09-20 MED ORDER — FUROSEMIDE 10 MG/ML IJ SOLN
60.0000 mg | Freq: Once | INTRAMUSCULAR | Status: AC
  Administered 2022-09-20: 60 mg via INTRAVENOUS
  Filled 2022-09-20: qty 6

## 2022-09-20 MED ORDER — SODIUM ZIRCONIUM CYCLOSILICATE 10 G PO PACK
10.0000 g | PACK | Freq: Once | ORAL | Status: AC
  Administered 2022-09-20: 10 g
  Filled 2022-09-20: qty 1

## 2022-09-20 MED ORDER — FREE WATER
200.0000 mL | Status: DC
  Administered 2022-09-20 – 2022-09-23 (×18): 200 mL

## 2022-09-20 NOTE — Progress Notes (Signed)
NAME:  Dustin Jones, MRN:  [id number], DOB:  1960-12-26, LOS: 19 ADMISSION DATE:  2022/09/28, CONSULTATION DATE:  09/28/22 REFERRING MD:  Anitra Lauth, EDP, CHIEF COMPLAINT:  ICH   History of Present Illness:  61 year old man who presented to [facility] 9/28 as a Code Stroke. PMHx significant for HTN, DVT and chronic tobacco use.   LKW ~0300 9/28. Found down ~0800 by bystanders who called 911. EMS reported the patient had L-sided deficits and AMS. Systolic BP reported to be in the 190s per EMS. CT Head demonstrated acute intraparenchymal hemorrhage with possible lobulated 41mm outpouching arising from the supraclinoid right ICA, suspicious for ruptured aneurysm. Admitted by Neurosurgery for close monitoring and further diagnostic testing.    PCCM was consulted for ventilator management.   Pertinent  Medical History  HTN, tobacco use, and DVT   Significant Hospital Events: Including procedures, antibiotic start and stop dates in addition to other pertinent events   9/28 admitted. Intracranial Hemorrhage/ R MCA ruptured aneurysm Non-con CT of head demonstrating large acute hemorrhage in the R lentiform nucleus with with intraventricular extension, midline shift, and slight rounding of the temporal horns, possibly representing early hydrocephalus. On CTA, pt found to have a R MCA ruptured aneurysm. Neurosurgery consulted and following with plans for possible arterial clipping this evening. CT of the face and cervical spine did not show any acute traumatic injuries, though BL cervical lymphadenopathy was noted. Echo with normal biventricular function without evidence of hemodynamically sig valvular heart disease.  9/28 complete echo with LVEF of 60-65%, no regional wall motion abnormalities but with grade 1 diastolic dysfunction.  Normal right ventricular systolic function.  No valvular abnormalities. 9/29 MRI brain 1. Large acute intraparenchymal hemorrhage centered in the rightlentiform nucleus, with  extension into the right-greater-than-left lateral ventricle, third ventricle, and fourth ventricle, overall similar to the prior CT. Mild mass effect and 5 mm of right-to-left midline shift. No hydrocephalus. 2. Small areas of acute infarct in the bilateral occipital lobes, bilateral frontal lobes, and right parietal lobe. Spiking fever. Has sig leukocytosis. Cultures sent. Unasyn started for aspiration  9/29 respiratory culture positive for MSSA 9/29 self extubated, required reintubation 10/2 Family identified  10/6 bronchospasm, steroids of this, bronc with clear lungs, ETT 10/9 Neuro exam/mental status remains poor. Coming down on vent requirements. BP improved, remains off of Cleviprex. D/w family re: tracheostomy. 10/10 Remains hypertensive, agitation. Sedation adjusted (Precedex, Fentanyl/Versed PRN). Increased peak pressures on vent. CXR worse with asymmetric pulmonary edema. Lasix given. Trach with CCM. 10/11 Cortrak placed 10/11 ID consulted, abx changed from Cefazolin to Cefepime 10/13 WBC up to 56 (was 49 day prior). CT chest/abd/pelv ordered. 10/15-transferred to the hospitalist service 10/16-Transferred to ICU for worsening respiratory status   Interim History / Subjective:  Overnight patient required lokelma as potassium came back elevated and required restaints as patient became a little more agitated.  Patient also developed a fever 101.3 F at 7:30 PM yesterday.  Patient has not had a fever since.  Patient is morning is able to awake to verbal stimuli, and attempts to follow commands.  Objective   Blood pressure (!) 140/89, pulse 77, temperature 100 F (37.8 C), temperature source Oral, resp. rate 16, height 5\' 7"  (1.702 m), weight 124 kg, SpO2 95 %.    Vent Mode: PRVC FiO2 (%):  [50 %-100 %] 50 % Set Rate:  [18 bmp] 18 bmp Vt Set:  [550 mL] 550 mL PEEP:  [5 cmH20-8 cmH20] 8 cmH20 Plateau Pressure:  [15 cmH20-19  cmH20] 17 cmH20   Intake/Output Summary (Last 24 hours) at  09/20/2022 0847 Last data filed at 09/20/2022 0600 Gross per 24 hour  Intake 1600.29 ml  Output 1775 ml  Net -174.71 ml   Filed Weights   09/18/22 0500 09/18/22 0652 09/20/22 0500  Weight: 122 kg 123.1 kg 124 kg    Examination:  General: Patient is in bed with trach on Eyes: Pupils equal and reactive to light, unable to track Head: Normocephalic, atraumatic  Neck: Trach in place with secretions noted Cardio: Regular rate and rhythm, no murmurs, rubs or gallops Chest: No chest tenderness Pulmonary: Clear to ausculation bilaterally with no rales, rhonchi, and crackles  Abdomen: Soft, nontender with hyperactive bowel sounds Neuro: Nonverbal.  Can follow commands.  Gag reflex intact.  Pupils equal and reactive.   Resolved Hospital Problem list   Hypernatremia  Assessment & Plan:  This is a 61 year old male with a past medical history of HTN, tobacco use, and DVT who presented with concerns of left side deficits. Patient had an ICH and required arterial coiling. Patient required intubation as patient was not able to protect his airway at the time. Patient has since been extubated and patient has tracheostomy placed. Patient yesterday developed worsening respiratory status and therefore patient was transferred back to the ICU given patient required to go back on the ventilator.   #Acute hypoxemic respiratory failure likely secondary to pneumonia #MSSA and Enterobacter pneumonia Patient initially required intubation, and was extubated after getting tracheostomy 10/11.  Patient's most recent BLL growing Enterobacter which is susceptible to cefepime.  Patient was to do 8 days of cefepime with end date of 09/21/2022.  Yesterday, patient had worsening shortness of breath, requiring to be put back on the ventilator.  Bedside bronchoscopy was performed showing possible aspirate particles.  Patient did spike fever at 7:30 PM yesterday of 101.3 F.  On exam, patient does have coarse breath sounds  with congestion heard bilaterally.  Worsening respiratory failure likely secondary to aspiration required patient to go back on ventilator.  BAL performed with bronc, will follow cultures.  Continue cefepime for now.  Consider adding Flagyl for anaerobic coverage. 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  Patient is stable from neurological standpoint.  Patient does have left-sided weakness noted which is his new baseline.  Overall has remained stable, most recent CT scan of the head was on 10/5 which showed no significant change in size and morphology of the intraparenchymal hemorrhage right basal ganglia, has surrounding vasogenic edema and a 5 mm of right-to-left shift which was similar.  Intraventricular blood in the lateral ventricles were decreased. -Continue PT/OT -Continue to monitor for any worsening neurological deficits  #Toxic metabolic encephalopathy This is likely secondary to stroke, unsure of what baseline will be.  Patient is able to follow some commands.  Patient is nonverbal given the patient has a tracheostomy.  Patient sometimes opens his eyes, but does not open eyes to verbal stimuli.  Overnight patient did get agitated and required some fentanyl. -Clonazepam 0.5 mg twice daily -As needed oxycodone for agitation  #Hypertension  Patient's blood pressure currently stable in the 130s.  Continue with patient's home medication with amlodipine, clonidine, lisinopril, and HCTZ. Goal blood pressure less than 160.  Continue nimodipine  for total 21 days.   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 CT chest a/p negative for malignancy, infection  -Continue to monitor WBC periodically   #Hypernatremia, resolved  Sodium 141 this morning -Continue to monitor -likely in the setting of salt load with IV fluids as well as diuresis.  Continue free water flushes, monitor.  Sodium improving  #Hyperkalemia Overnight patient had potassium 5.6.  Lokelma given.  This is likely in the setting of acidosis. -recheck   #Type 2 diabetes mellitus A1C 6.5%.  Patient's blood glucose may during between 111-180 -Continue SSI  #Thrombocytopenia -If this is true, this is likely in the setting of acute critical illness.  Platelet count 125 today.  Down from 58. -Recheck  Best Practice (right click and "Reselect all SmartList Selections" daily)   Diet/type: tubefeeds DVT prophylaxis: prophylactic heparin  GI prophylaxis: PPI Lines: N/A Foley:  N/A Code Status:  full code  Labs   CBC: Recent Labs  Lab 09/16/22 0635 09/17/22 0736 09/18/22 0719 09/19/22 0451 09/20/22 0320  WBC 55.9* 48.4* 47.7* 45.0* 47.6*  NEUTROABS 10.0* 7.8* 7.7 6.9 7.6  HGB 13.1 13.3 12.3* 12.1* 12.7*  HCT 42.7 42.8 39.1 39.4 40.5  MCV 97.5 97.5 95.8 97.0 97.4  PLT 230 230 262 236 125*    Basic Metabolic Panel: Recent Labs  Lab 09/14/22 0337 09/15/22 0444 09/15/22 0457 09/16/22 0635 09/17/22 0736 09/19/22 0451 09/20/22 0320  NA 153* 149* 149* 148* 148* 146* 141  K 4.4 3.6 3.5 4.3 3.9 4.0 5.6*  CL 119* 114*  --  116* 116* 116* 115*  CO2 26 23  --  23 23 23  18*  GLUCOSE 145* 191*  --  117* 140* 131* 125*  BUN 40* 35*  --  30* 30* 35* 43*  CREATININE 1.02 0.80  --   0.80 0.80 0.86 1.01  CALCIUM 9.4 9.4  --  8.7* 9.2 9.1 8.9  MG 2.4 2.4  --  2.2 2.3 2.2 2.2  PHOS 4.4 4.2  --  3.8 3.8 3.7  --    GFR: Estimated Creatinine Clearance: 97 mL/min (by C-G formula based on SCr of 1.01 mg/dL). Recent Labs  Lab 09/17/22 0736 09/18/22 0719 09/19/22 0451 09/20/22 0320  WBC 48.4* 47.7* 45.0* 47.6*    Liver Function Tests: Recent Labs  Lab 09/19/22 0451  AST 42*  ALT 47*  ALKPHOS 56  BILITOT 0.5  PROT 6.7  ALBUMIN 2.7*   No results for input(s): "LIPASE", "AMYLASE" in the last 168 hours. No results for input(s): "AMMONIA" in the last 168 hours.  ABG    Component Value Date/Time   PHART 7.43 09/19/2022 1455   PCO2ART 41 09/19/2022 1455   PO2ART 53 (L) 09/19/2022 1455   HCO3 27.2 09/19/2022 1455   TCO2 23 09/15/2022 0457   ACIDBASEDEF 2.0 09/15/2022 0457   O2SAT 85.5 09/19/2022 1455     Coagulation Profile: No results for input(s): "INR", "PROTIME" in the last 168 hours.  Cardiac Enzymes: No results for input(s): "CKTOTAL", "CKMB", "CKMBINDEX", "TROPONINI" in the last 168 hours.  HbA1C: Hgb A1c MFr Bld  Date/Time Value Ref Range Status  09/02/22 12:34 PM 6.5 (H) 4.8 - 5.6 % Final    Comment:    (NOTE) Pre diabetes:          5.7%-6.4%  Diabetes:              >6.4%  Glycemic control for   <7.0% adults with diabetes     CBG: Recent Labs  Lab 09/19/22 1556 09/19/22 1930 09/19/22 2345 09/20/22 0339 09/20/22 0728  GLUCAP 121* 150* 149* 111* 126*    Review of Systems:   Negative except what is stated in HPI.   Past Medical History:  He,  has no past medical history on file.   Surgical History:   Past Surgical History:  Procedure Laterality Date   IR ANGIO INTRA EXTRACRAN SEL INTERNAL CAROTID BILAT MOD SED  02-Sep-2022   IR ANGIOGRAM FOLLOW UP STUDY  09-02-22   IR ANGIOGRAM FOLLOW UP STUDY  09-02-22   IR TRANSCATH/EMBOLIZ  09/02/22   RADIOLOGY WITH ANESTHESIA N/A 2022-09-02   Procedure: IR WITH ANESTHESIA;   Surgeon: Lisbeth Renshaw, MD;  Location: [facility] OR;  Service: Radiology;  Laterality: N/A;     Social History:      Family History:  His family history is not on file.   Allergies Not on File   Home Medications  Prior to Admission medications   Medication Sig Start Date End Date Taking? Authorizing Provider  cloNIDine (CATAPRES) 0.3 MG tablet Take 0.3 mg by mouth 2 (two) times daily. 07/07/22   [provider]  cyclobenzaprine (FLEXERIL) 10 MG tablet Take 10 mg by mouth at bedtime as needed for muscle spasms. 07/07/22   [provider]  hydrALAZINE (APRESOLINE) 100 MG tablet Take 100 mg by mouth 3 (three) times daily. 07/07/22   [provider]  lisinopril (ZESTRIL) 40 MG tablet Take 40 mg by mouth daily. 07/07/22   [provider]  meloxicam (MOBIC) 15 MG tablet Take 15 mg by mouth daily as needed for pain. 07/07/22   [provider]  simvastatin (ZOCOR) 20 MG tablet Take 20 mg by mouth at bedtime. 07/07/22   [provider]     Critical care time: 38    Modena Slater, DO Internal Medicine Resident PGY-1 Pager: [phone]

## 2022-09-20 NOTE — Progress Notes (Signed)
Per CCM order, RT removed pt's sutures from trach site. Pt tolerated well with SVS. RT provided trach care for pt. RT cleansed, dried, changed protective bandage under trach, replaced with clean trach tie and placed new inner cannula.

## 2022-09-20 NOTE — Progress Notes (Signed)
[place] Progress Note Patient Name: Dustin Jones DOB: [birth year] MRN: [id number]   Date of Service  09/20/2022  HPI/Events of Note  Nursing request to renew order for soft R wrist restraint.   eICU Interventions  Will reneew R soft wrist restraint X 7 hours.      Intervention Category Major Interventions: Delirium, psychosis, severe agitation - evaluation and management  Omeka Holben Eugene 09/20/2022, 3:04 AM

## 2022-09-20 NOTE — Progress Notes (Signed)
[place] Progress Note Patient Name: Dustin Jones DOB: 1961/11/07 MRN: [id number]   Date of Service  09/20/2022  HPI/Events of Note  Hyperkalemia - K+ = 5.6.  eICU Interventions  Plan: Lokelma 10 gm per tube X 1. Repeat BMP at 12 noon.      Intervention Category Major Interventions: Electrolyte abnormality - evaluation and management  [name] 09/20/2022, 4:31 AM

## 2022-09-20 NOTE — Plan of Care (Signed)
  Problem: Education: Goal: Knowledge of General Education information will improve Description: Including pain rating scale, medication(s)/side effects and non-pharmacologic comfort measures Outcome: Progressing   Problem: Health Behavior/Discharge Planning: Goal: Ability to manage health-related needs will improve Outcome: Progressing   Problem: Clinical Measurements: Goal: Ability to maintain clinical measurements within normal limits will improve Outcome: Progressing Goal: Will remain free from infection Outcome: Progressing Goal: Diagnostic test results will improve Outcome: Progressing Goal: Respiratory complications will improve Outcome: Progressing Goal: Cardiovascular complication will be avoided Outcome: Progressing   Problem: Activity: Goal: Risk for activity intolerance will decrease Outcome: Progressing   Problem: Nutrition: Goal: Adequate nutrition will be maintained Outcome: Progressing   Problem: Coping: Goal: Level of anxiety will decrease Outcome: Progressing   Problem: Elimination: Goal: Will not experience complications related to bowel motility Outcome: Progressing Goal: Will not experience complications related to urinary retention Outcome: Progressing   Problem: Pain Managment: Goal: General experience of comfort will improve Outcome: Progressing   Problem: Safety: Goal: Ability to remain free from injury will improve Outcome: Progressing   Problem: Skin Integrity: Goal: Risk for impaired skin integrity will decrease Outcome: Progressing   Problem: Safety: Goal: Non-violent Restraint(s) Outcome: Progressing   Problem: Education: Goal: Ability to describe self-care measures that may prevent or decrease complications (Diabetes Survival Skills Education) will improve Outcome: Progressing Goal: Individualized Educational Video(s) Outcome: Progressing   Problem: Coping: Goal: Ability to adjust to condition or change in health will  improve Outcome: Progressing   Problem: Fluid Volume: Goal: Ability to maintain a balanced intake and output will improve Outcome: Progressing   Problem: Health Behavior/Discharge Planning: Goal: Ability to identify and utilize available resources and services will improve Outcome: Progressing Goal: Ability to manage health-related needs will improve Outcome: Progressing   Problem: Metabolic: Goal: Ability to maintain appropriate glucose levels will improve Outcome: Progressing   Problem: Nutritional: Goal: Maintenance of adequate nutrition will improve Outcome: Progressing Goal: Progress toward achieving an optimal weight will improve Outcome: Progressing   Problem: Skin Integrity: Goal: Risk for impaired skin integrity will decrease Outcome: Progressing   Problem: Tissue Perfusion: Goal: Adequacy of tissue perfusion will improve Outcome: Progressing   Problem: Education: Goal: Knowledge of disease or condition will improve Outcome: Progressing Goal: Knowledge of secondary prevention will improve (SELECT ALL) Outcome: Progressing Goal: Knowledge of patient specific risk factors will improve (INDIVIDUALIZE FOR PATIENT) Outcome: Progressing Goal: Individualized Educational Video(s) Outcome: Progressing   Problem: Health Behavior/Discharge Planning: Goal: Ability to manage health-related needs will improve Outcome: Progressing   Problem: Education: Goal: Knowledge about tracheostomy care/management will improve Outcome: Progressing   Problem: Activity: Goal: Ability to tolerate increased activity will improve Outcome: Progressing   Problem: Health Behavior/Discharge Planning: Goal: Ability to manage tracheostomy will improve Outcome: Progressing   Problem: Respiratory: Goal: Patent airway maintenance will improve Outcome: Progressing   Problem: Role Relationship: Goal: Ability to communicate will improve Outcome: Progressing   Problem: Education: Goal:  Knowledge of secondary prevention will improve (SELECT ALL) Outcome: Progressing Goal: Knowledge of patient specific risk factors will improve (INDIVIDUALIZE FOR PATIENT) Outcome: Progressing   Problem: Intracerebral Hemorrhage Tissue Perfusion: Goal: Complications of Intracerebral Hemorrhage will be minimized Outcome: Progressing   

## 2022-09-20 NOTE — Progress Notes (Signed)
Nutrition Follow-up  DOCUMENTATION CODES:   Obesity unspecified  INTERVENTION:   Continue tube feeds via Cortrak: - Osmolite 1.5 @ 55 ml/hr (1320 ml/day) - PROSource TF20 60 ml BID - Free water flushes per CCM, currently 200 ml q 4 hours  Tube feeding regimen provides 2140 kcal, 123 grams of protein, and 1006 ml of H2O.   Total free water with flushes: 2206 ml  - Continue Banatrol TF BID  NUTRITION DIAGNOSIS:   Inadequate oral intake related to inability to eat as evidenced by NPO status.  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  09/29 - TF started 09/30 - TF changed to Nepro (providing 2456 kcal and 127 grams of protein) 10/02 - TF changed to Osmolite 1.5 10/10 - s/p trach placement 10/11 - s/p cortrak placement (tip gastric) 10/12 - weaned to trach collar 10/16 - transferred to ICU, back on vent support  Discussed pt with RN and during ICU rounds. Pt tolerating current tube feeding regimen at goal. Banatrol TF has been added due to diarrhea. Pt continues to have rectal tube in place.  Admit weight: 100 kg Current weight: 124 kg  Pt with edema to BUE and BLE.  Current TF: Osmolite 1.5 @ 55 ml/hr, PROSource TF20 60 ml BID, free water flushes 200 ml q 4 hours  Patient is currently on ventilator support via trach MV: 10.3 L/min Temp (24hrs), Avg:99.4 F (37.4 C), Min:98.4 F (36.9 C), Max:101.3 F (38.5 C)  Medications reviewed and include: colace, banatrol TF 60 ml BID, SSI q 4 hours, protonix, miralax, IV abx, nimotop  Labs reviewed: potassium 5.6, BUN 43, elevated LFTs, WBC 47.6,  platelets 125 CBG's: 111-150 x 24 hours  UOP: 2057 ml x 24 hours Stool: 650 ml x 24 hours via rectal tube I/O's: -7.3 L since admit  Diet Order:   Diet Order             Diet NPO time specified  Diet effective now                   EDUCATION NEEDS:   No education needs have been identified at this time  Skin:  Skin Assessment: Skin Integrity Issues: Stage II: R leg (pretibial) - old healing ulcer Other: MARSI penis  Last BM:  09/20/22 rectal tube  Height:   Ht Readings from Last 1 Encounters:  09/18/22 5' 7"  (1.702 m)    Weight:   Wt Readings from Last 1 Encounters:  09/20/22 124 kg    BMI:  Body mass index is 42.82 kg/m.  Estimated Nutritional Needs:   Kcal:  2000-2200  Protein:  100-120 grams  Fluid:  >2 L/day    Gustavus Bryant, MS, RD, LDN Inpatient Clinical Dietitian Please see AMiON for contact information.

## 2022-09-20 NOTE — Progress Notes (Signed)
PT Cancellation Note  Patient Details Name: Dustin Jones MRN: [id number] DOB: 1961/03/22   Cancelled Treatment:    Reason Eval/Treat Not Completed: Fatigue/lethargy limiting ability to participate (Lethargic and could not arouse pt.  Will check back in pm as time allows.)   Alvira Philips 09/20/2022, 11:33 AM Ortencia Askari M,PT Acute Rehab Services [phone]

## 2022-09-20 NOTE — Progress Notes (Signed)
SLP Cancellation Note  Patient Details Name: Dustin Jones MRN: [id number] DOB: [birth year]   Cancelled treatment:       Reason Eval/Treat Not Completed: Medical issues which prohibited therapy;Other (comment) (Patient back on full vent support. SLP will continue to follow for readiness)   Sonia Baller, MA, CCC-SLP Speech Therapy

## 2022-09-21 LAB — CBC WITH DIFFERENTIAL/PLATELET
Abs Immature Granulocytes: 0.19 10*3/uL — ABNORMAL HIGH (ref 0.00–0.07)
Basophils Absolute: 0.1 10*3/uL (ref 0.0–0.1)
Basophils Relative: 0 %
Eosinophils Absolute: 0.3 10*3/uL (ref 0.0–0.5)
Eosinophils Relative: 1 %
HCT: 36.1 % — ABNORMAL LOW (ref 39.0–52.0)
Hemoglobin: 11.3 g/dL — ABNORMAL LOW (ref 13.0–17.0)
Immature Granulocytes: 1 %
Lymphocytes Relative: 81 %
Lymphs Abs: 33.6 10*3/uL — ABNORMAL HIGH (ref 0.7–4.0)
MCH: 29.7 pg (ref 26.0–34.0)
MCHC: 31.3 g/dL (ref 30.0–36.0)
MCV: 95 fL (ref 80.0–100.0)
Monocytes Absolute: 0.8 10*3/uL (ref 0.1–1.0)
Monocytes Relative: 2 %
Neutro Abs: 6.4 10*3/uL (ref 1.7–7.7)
Neutrophils Relative %: 15 %
Platelets: 208 10*3/uL (ref 150–400)
RBC: 3.8 MIL/uL — ABNORMAL LOW (ref 4.22–5.81)
RDW: 13.2 % (ref 11.5–15.5)
WBC: 41.3 10*3/uL — ABNORMAL HIGH (ref 4.0–10.5)
nRBC: 0 % (ref 0.0–0.2)

## 2022-09-21 LAB — BASIC METABOLIC PANEL
Anion gap: 7 (ref 5–15)
BUN: 48 mg/dL — ABNORMAL HIGH (ref 8–23)
CO2: 21 mmol/L — ABNORMAL LOW (ref 22–32)
Calcium: 9 mg/dL (ref 8.9–10.3)
Chloride: 113 mmol/L — ABNORMAL HIGH (ref 98–111)
Creatinine, Ser: 1.19 mg/dL (ref 0.61–1.24)
GFR, Estimated: >60 mL/min (ref >60)
Glucose, Bld: 149 mg/dL — ABNORMAL HIGH (ref 70–99)
Potassium: 4.1 mmol/L (ref 3.5–5.1)
Sodium: 141 mmol/L (ref 135–145)

## 2022-09-21 LAB — GLUCOSE, CAPILLARY
Glucose-Capillary: 119 mg/dL — ABNORMAL HIGH (ref 70–99)
Glucose-Capillary: 125 mg/dL — ABNORMAL HIGH (ref 70–99)
Glucose-Capillary: 131 mg/dL — ABNORMAL HIGH (ref 70–99)
Glucose-Capillary: 132 mg/dL — ABNORMAL HIGH (ref 70–99)
Glucose-Capillary: 136 mg/dL — ABNORMAL HIGH (ref 70–99)
Glucose-Capillary: 141 mg/dL — ABNORMAL HIGH (ref 70–99)

## 2022-09-21 NOTE — Plan of Care (Signed)
  Problem: Education: Goal: Knowledge of General Education information will improve Description: Including pain rating scale, medication(s)/side effects and non-pharmacologic comfort measures Outcome: Progressing   Problem: Health Behavior/Discharge Planning: Goal: Ability to manage health-related needs will improve Outcome: Progressing   Problem: Clinical Measurements: Goal: Ability to maintain clinical measurements within normal limits will improve Outcome: Progressing Goal: Will remain free from infection Outcome: Progressing Goal: Diagnostic test results will improve Outcome: Progressing Goal: Respiratory complications will improve Outcome: Progressing Goal: Cardiovascular complication will be avoided Outcome: Progressing   Problem: Activity: Goal: Risk for activity intolerance will decrease Outcome: Progressing   Problem: Nutrition: Goal: Adequate nutrition will be maintained Outcome: Progressing   Problem: Coping: Goal: Level of anxiety will decrease Outcome: Progressing   Problem: Elimination: Goal: Will not experience complications related to bowel motility Outcome: Progressing Goal: Will not experience complications related to urinary retention Outcome: Progressing   Problem: Pain Managment: Goal: General experience of comfort will improve Outcome: Progressing   Problem: Safety: Goal: Ability to remain free from injury will improve Outcome: Progressing   Problem: Skin Integrity: Goal: Risk for impaired skin integrity will decrease Outcome: Progressing   Problem: Safety: Goal: Non-violent Restraint(s) Outcome: Progressing   Problem: Education: Goal: Ability to describe self-care measures that may prevent or decrease complications (Diabetes Survival Skills Education) will improve Outcome: Progressing Goal: Individualized Educational Video(s) Outcome: Progressing   Problem: Coping: Goal: Ability to adjust to condition or change in health will  improve Outcome: Progressing   Problem: Fluid Volume: Goal: Ability to maintain a balanced intake and output will improve Outcome: Progressing   Problem: Health Behavior/Discharge Planning: Goal: Ability to identify and utilize available resources and services will improve Outcome: Progressing Goal: Ability to manage health-related needs will improve Outcome: Progressing   Problem: Metabolic: Goal: Ability to maintain appropriate glucose levels will improve Outcome: Progressing   Problem: Nutritional: Goal: Maintenance of adequate nutrition will improve Outcome: Progressing Goal: Progress toward achieving an optimal weight will improve Outcome: Progressing   Problem: Skin Integrity: Goal: Risk for impaired skin integrity will decrease Outcome: Progressing   Problem: Tissue Perfusion: Goal: Adequacy of tissue perfusion will improve Outcome: Progressing   Problem: Education: Goal: Knowledge of disease or condition will improve Outcome: Progressing Goal: Knowledge of secondary prevention will improve (SELECT ALL) Outcome: Progressing Goal: Knowledge of patient specific risk factors will improve (INDIVIDUALIZE FOR PATIENT) Outcome: Progressing Goal: Individualized Educational Video(s) Outcome: Progressing   Problem: Health Behavior/Discharge Planning: Goal: Ability to manage health-related needs will improve Outcome: Progressing   Problem: Education: Goal: Knowledge about tracheostomy care/management will improve Outcome: Progressing   Problem: Activity: Goal: Ability to tolerate increased activity will improve Outcome: Progressing   Problem: Health Behavior/Discharge Planning: Goal: Ability to manage tracheostomy will improve Outcome: Progressing   Problem: Respiratory: Goal: Patent airway maintenance will improve Outcome: Progressing   Problem: Role Relationship: Goal: Ability to communicate will improve Outcome: Progressing   Problem: Education: Goal:  Knowledge of secondary prevention will improve (SELECT ALL) Outcome: Progressing Goal: Knowledge of patient specific risk factors will improve (INDIVIDUALIZE FOR PATIENT) Outcome: Progressing   Problem: Intracerebral Hemorrhage Tissue Perfusion: Goal: Complications of Intracerebral Hemorrhage will be minimized Outcome: Progressing   

## 2022-09-21 NOTE — Progress Notes (Addendum)
NAME:  Dustin Jones, MRN:  [id number], DOB:  05/02/1961, LOS: 63 ADMISSION DATE:  09/13/2022, CONSULTATION DATE:  Sep 13, 2022 REFERRING MD:  Maryan Rued, EDP, CHIEF COMPLAINT:  ICH   History of Present Illness:  61 year old man who presented to [facility] 9/28 as a Code Stroke. PMHx significant for HTN, DVT and chronic tobacco use.   LKW ~0300 9/28. Found down ~0800 by bystanders who called 911. EMS reported the patient had L-sided deficits and AMS. Systolic BP reported to be in the 190s per EMS. CT Head demonstrated acute intraparenchymal hemorrhage with possible lobulated 67mm outpouching arising from the supraclinoid right ICA, suspicious for ruptured aneurysm. Admitted by Neurosurgery for close monitoring and further diagnostic testing.    PCCM was consulted for ventilator management.   Pertinent  Medical History  HTN, tobacco use, and DVT   Significant Hospital Events: Including procedures, antibiotic start and stop dates in addition to other pertinent events   9/28 admitted. Intracranial Hemorrhage/ R MCA ruptured aneurysm Non-con CT of head demonstrating large acute hemorrhage in the R lentiform nucleus with with intraventricular extension, midline shift, and slight rounding of the temporal horns, possibly representing early hydrocephalus. On CTA, pt found to have a R MCA ruptured aneurysm. Neurosurgery consulted and following with plans for possible arterial clipping this evening. CT of the face and cervical spine did not show any acute traumatic injuries, though BL cervical lymphadenopathy was noted. Echo with normal biventricular function without evidence of hemodynamically sig valvular heart disease.  9/28 complete echo with LVEF of 60-65%, no regional wall motion abnormalities but with grade 1 diastolic dysfunction.  Normal right ventricular systolic function.  No valvular abnormalities. 9/29 MRI brain 1. Large acute intraparenchymal hemorrhage centered in the rightlentiform nucleus, with  extension into the right-greater-than-left lateral ventricle, third ventricle, and fourth ventricle, overall similar to the prior CT. Mild mass effect and 5 mm of right-to-left midline shift. No hydrocephalus. 2. Small areas of acute infarct in the bilateral occipital lobes, bilateral frontal lobes, and right parietal lobe. Spiking fever. Has sig leukocytosis. Cultures sent. Unasyn started for aspiration  9/29 respiratory culture positive for MSSA 9/29 self extubated, required reintubation 10/2 Family identified  10/6 bronchospasm, steroids of this, bronc with clear lungs, ETT 10/9 Neuro exam/mental status remains poor. Coming down on vent requirements. BP improved, remains off of Cleviprex. D/w family re: tracheostomy. 10/10 Remains hypertensive, agitation. Sedation adjusted (Precedex, Fentanyl/Versed PRN). Increased peak pressures on vent. CXR worse with asymmetric pulmonary edema. Lasix given. Trach with CCM. 10/11 Cortrak placed 10/11 ID consulted, abx changed from Cefazolin to Cefepime 10/13 WBC up to 56 (was 49 day prior). CT chest/abd/pelv ordered. 10/15-transferred to the hospitalist service 10/16-Transferred to ICU for worsening respiratory status   Interim History / Subjective:  Overnight no concerns.  Patient's oxygen requirement is going down.  No need for pressors at this time.  Patient can awake to my voice.  Patient follows commands.  Patient is nonverbal given he has a trach.  Objective   Blood pressure 128/80, pulse 73, temperature 99.6 F (37.6 C), temperature source Axillary, resp. rate 20, height 5\' 7"  (1.702 m), weight 122 kg, SpO2 98 %.    Vent Mode: CPAP;PSV FiO2 (%):  [40 %-50 %] 40 % Set Rate:  [18 bmp] 18 bmp Vt Set:  [550 mL] 550 mL PEEP:  [5 cmH20] 5 cmH20 Pressure Support:  [5 cmH20] 5 cmH20 Plateau Pressure:  [10 cmH20-14 cmH20] 14 cmH20   Intake/Output Summary (Last 24 hours) at 09/21/2022  2229 Last data filed at 09/21/2022 0600 Gross per 24 hour  Intake  2414.94 ml  Output 2775 ml  Net -360.06 ml   Filed Weights   09/18/22 0652 09/20/22 0500 09/21/22 0500  Weight: 123.1 kg 124 kg 122 kg    Examination:  General: Patient is in bed with trach on Eyes: Pupils equal and reactive to light, able to track today Head: Normocephalic, atraumatic  Neck: Trach in place with secretions noted Cardio: Regular rate and rhythm, no murmurs, rubs or gallops Chest: No chest tenderness Pulmonary: Coarse breath sounds throughout Abdomen: Soft, slightly distended today. Neuro: Nonverbal.  Can follow commands.  Gag reflex intact.  Pupils equal and reactive MSK: Increased grip strength on right side versus left.  Resolved Hospital Problem list   Hypernatremia Hyperkalemia Thrombocytopenia  Assessment & Plan:  This is a 61 year old male with a past medical history of HTN, tobacco use, and DVT who presented with concerns of left side deficits. Patient had an ICH and required arterial coiling. Patient required intubation as patient was not able to protect his airway at the time. Patient has since been extubated and patient has tracheostomy placed. Patient yesterday developed worsening respiratory status and therefore patient was transferred back to the ICU given patient required to go back on the ventilator.   #Acute hypoxemic respiratory failure likely secondary to pneumonia #MSSA and Enterobacter pneumonia Patient initially required intubation, and was extubated after getting tracheostomy 10/11.  Patient's most recent BLL growing Enterobacter which is susceptible to cefepime.  Cefepime to drop off today.  Patient's vent requirements are slowly going down.  On exam this morning, patient is on pressure support.  Overnight there were no fevers.  Patient has been 24 hours without fever now. On exam, patient does have coarse breath sounds with congestion heard bilaterally.  New BAL culture on 09/19/2022 showing rare gram negative rods.  Continue to follow.  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  Patient is stable from neurological standpoint.  Patient does have left-sided weakness noted which is his new baseline.  Overall has remained stable, most recent CT scan of the head was on 10/5 which showed no significant change in size and morphology of the intraparenchymal hemorrhage right basal ganglia, has surrounding vasogenic edema and a 5 mm of right-to-left shift which was similar.  Intraventricular blood in the lateral ventricles were decreased.  PT OT tried to work with patient yesterday, but patient was not able to given his current state.  Patient's baseline seems to be with left-sided weakness.  Right side with minimal weakness compared to left. -Continue PT/OT -Continue to monitor for any worsening neurological deficits  #Toxic metabolic encephalopathy, resolving This is likely secondary to stroke, unsure of what baseline will be.  Patient is able to communicate nonverbally today.  He can follow commands.  Patient can open eyes to verbal stimuli patient no longer on any sedation.  -Clonazepam 0.5 mg twice daily -As needed oxycodone for agitation  #Hypertension Patient's blood pressure currently stable in the 110s and 120s.  Patient's goal is 130-150.  Continue with patient's home medication with amlodipine, clonidine, lisinopril,  and HCTZ. Goal blood pressure less than 160.  Continue nimodipine for total 21 days. 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 CT chest a/p negative for malignancy, infection  -Continue to monitor WBC periodically   #Hypernatremia, resolved  Sodium 141 this morning -Continue to monitor  #Hyperkalemia, resolved Potassium 4.1 today. -Continue monitoring BMP  #Type 2 diabetes mellitus A1C 6.5%.  Patient's blood glucose may during between 119-156.  Goal 140-180.  Patient is at goal -Continue SSI  #Thrombocytopenia, resolved This is likely lab error or blood clotting that caused what we thought was thrombocytopenia.  Platelets up to 208 today. -No concerns  Best Practice (right click and "Reselect all SmartList Selections" daily)   Diet/type: tubefeeds DVT prophylaxis: prophylactic heparin  GI prophylaxis: PPI Lines: N/A Foley:  N/A Code Status:  full code  Labs   CBC: Recent Labs  Lab 09/18/22 0719 09/19/22 0451 09/20/22 0320 09/20/22 1114 09/21/22 0128  WBC 47.7* 45.0* 47.6* 41.3* 41.3*  NEUTROABS 7.7 6.9 7.6 6.6 6.4  HGB 12.3* 12.1* 12.7* 12.3* 11.3*  HCT 39.1 39.4 40.5 39.0 36.1*  MCV 95.8 97.0 97.4 95.1 95.0  PLT 262 236 125* 199 208    Basic Metabolic Panel: Recent Labs  Lab 09/15/22 0444 09/15/22 0457 09/16/22 0635 09/17/22 0736 09/19/22 0451 09/20/22 0320 09/21/22 0128  NA 149*   < > 148* 148* 146* 141 141  K 3.6   < > 4.3 3.9 4.0 5.6* 4.1  CL 114*  --  116* 116* 116* 115* 113*  CO2 23  --  23 23 23  18* 21*  GLUCOSE 191*  --  117* 140* 131* 125* 149*  BUN 35*  --  30* 30* 35* 43* 48*  CREATININE 0.80  --  0.80 0.80 0.86 1.01 1.19  CALCIUM 9.4  --  8.7* 9.2 9.1 8.9 9.0  MG 2.4  --  2.2 2.3 2.2 2.2  --   PHOS 4.2  --  3.8 3.8 3.7  --   --    < > = values in this interval not displayed.   GFR: Estimated Creatinine Clearance: 81.6 mL/min (by C-G formula based  on SCr of 1.19 mg/dL). Recent Labs  Lab 09/19/22 0451 09/20/22 0320 09/20/22 1114 09/20/22 1237 09/21/22 0128  WBC 45.0* 47.6* 41.3*  --  41.3*  LATICACIDVEN  --   --   --  1.2  --     Liver Function Tests: Recent Labs  Lab 09/19/22 0451  AST 42*  ALT 47*  ALKPHOS 56  BILITOT 0.5  PROT 6.7  ALBUMIN 2.7*   No results for input(s): "LIPASE", "AMYLASE" in the last 168 hours. No results for input(s): "AMMONIA" in the last 168 hours.  ABG    Component Value Date/Time   PHART 7.43 09/19/2022 1455   PCO2ART 41 09/19/2022 1455   PO2ART 53 (L) 09/19/2022 1455   HCO3 27.2 09/19/2022 1455   TCO2 23 09/15/2022 0457   ACIDBASEDEF 2.0 09/15/2022 0457   O2SAT 85.5 09/19/2022 1455     Coagulation Profile: No results for input(s): "INR", "PROTIME" in the last 168 hours.  Cardiac Enzymes: No results for input(s): "CKTOTAL", "CKMB", "CKMBINDEX", "TROPONINI" in the last 168 hours.  HbA1C: Hgb A1c MFr Bld  Date/Time Value Ref Range Status  02-Sep-2022 12:34 PM 6.5 (H) 4.8 - 5.6 % Final    Comment:    (NOTE) Pre diabetes:          5.7%-6.4%  Diabetes:              >6.4%  Glycemic control for   <7.0% adults with diabetes     CBG: Recent Labs  Lab 09/20/22 1550 09/20/22 1931 09/20/22 2339 09/21/22 0339 09/21/22 0738  GLUCAP 165* 146* 156* 119* 131*    Review of Systems:   Negative except what is stated in HPI.   Past Medical History:  He,  has no past medical history on file.   Surgical History:   Past Surgical History:  Procedure Laterality Date   IR ANGIO INTRA EXTRACRAN SEL INTERNAL CAROTID BILAT MOD SED  02-Sep-2022   IR ANGIOGRAM FOLLOW UP STUDY  02-Sep-2022   IR ANGIOGRAM FOLLOW UP STUDY  09/02/2022   IR TRANSCATH/EMBOLIZ  09/02/22   RADIOLOGY WITH ANESTHESIA N/A September 02, 2022   Procedure: IR WITH ANESTHESIA;  Surgeon: Lisbeth Renshaw, MD;  Location: [facility] OR;  Service: Radiology;  Laterality: N/A;     Social History:      Family History:  His family  history is not on file.   Allergies Not on File   Home Medications  Prior to Admission medications   Medication Sig Start Date End Date Taking? Authorizing Provider  cloNIDine (CATAPRES) 0.3 MG tablet Take 0.3 mg by mouth 2 (two) times daily. 07/07/22   [provider]  cyclobenzaprine (FLEXERIL) 10 MG tablet Take 10 mg by mouth at bedtime as needed for muscle spasms. 07/07/22   [provider]  hydrALAZINE (APRESOLINE) 100 MG tablet Take 100 mg by mouth 3 (three) times daily. 07/07/22   [provider]  lisinopril (ZESTRIL) 40 MG tablet Take 40 mg by mouth daily. 07/07/22   [provider]  meloxicam (MOBIC) 15 MG tablet Take 15 mg by mouth daily as needed for pain. 07/07/22   [provider]  simvastatin (ZOCOR) 20 MG tablet Take 20 mg by mouth at bedtime. 07/07/22   [provider]     Critical care time: 39    Modena Slater, DO Internal Medicine Resident PGY-1 Pager: [phone]

## 2022-09-21 NOTE — Progress Notes (Signed)
Pt with increase WOB on ATC. Pt placed back on documented full support vent settings to rest for the night. Pt tolerating well.

## 2022-09-21 NOTE — Progress Notes (Signed)
Physical Therapy Treatment Patient Details Name: Dustin Jones MRN: [id number] DOB: [birth year] Today's Date: 09/21/2022   History of Present Illness The pt is a 61 y.o. unidentified homeless male presenting 9/28 after being found down by bystanders. 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 S/P coil embolization of R ICA aneurysm 9/28. Intubated 9/29. Trach placement 10/10. No PMH on file.    PT Comments    Pt admitted with above diagnosis. Pt was unable to follow commands today for any exercise/activity. Pt with spontaneous movement of bil LEs but no movement of any extremity to command.  Performed PROM of all 4 extremities and positioned pt in bed in chair position by end of session.  VSS with therapy with slight incr in BP at end of treatment.  Pt currently with functional limitations due to balance and endurance deficits. Pt will benefit from skilled PT to increase their independence and safety with mobility to allow discharge to the venue listed below.      Recommendations for follow up therapy are one component of a multi-disciplinary discharge planning process, led by the attending physician.  Recommendations may be updated based on patient status, additional functional criteria and insurance authorization.  Follow Up Recommendations  Skilled nursing-short term rehab (<3 hours/day) (vs LTACH) Can patient physically be transported by private vehicle: No   Assistance Recommended at Discharge Frequent or constant Supervision/Assistance  Patient can return home with the following Two people to help with walking and/or transfers;Two people to help with bathing/dressing/bathroom;Assistance with cooking/housework;Assistance with feeding;Direct supervision/assist for medications management;Direct supervision/assist for financial management;Help with stairs or ramp for  entrance;Assist for transportation   Equipment Recommendations  Other (comment) (TBA)    Recommendations for Other Services OT consult     Precautions / Restrictions Precautions Precautions: Fall;Other (comment) Precaution Comments: trach on vent; SBP < 160; Art-line; flexiseal; cortrak; R wrist restraint Restrictions Weight Bearing Restrictions: No     Mobility  Bed Mobility Overal bed mobility: Needs Assistance Bed Mobility: Rolling Rolling: Total assist, +2 for physical assistance, +2 for safety/equipment         General bed mobility comments: TAx2-3 to roll pt either direction in bedas pt not following cues to pull/roll. Total assist of 2 to move pt to [facility] and then progressively inclined bed to chair position at 50 degrees and placed pillows under Ues.    Transfers                   General transfer comment: deferred    Ambulation/Gait               General Gait Details: deferred   Stairs             Wheelchair Mobility    Modified Rankin (Stroke Patients Only) Modified Rankin (Stroke Patients Only) Pre-Morbid Rankin Score: No symptoms Modified Rankin: Severe disability     Balance                                            Cognition Arousal/Alertness: Lethargic Behavior During Therapy: Flat affect Overall Cognitive Status: Impaired/Different from baseline Area of Impairment: Attention, Following commands, Problem solving                   Current Attention Level: Focused   Following Commands:  Follows one step commands inconsistently, Follows one step commands with increased time     Problem Solving: Slow processing General Comments: Pt with eyes slightly open at times during session. However, did not make eye contact/focus on anyone and did not track, maintained forward gaze. Pt not following simple cues to move his R UE or lower extremity but did spontaneously move bil LEs at times.Did not follow cues  for functional mobility or L sided movements.        Exercises General Exercises - Upper Extremity Shoulder Flexion: PROM, Both, 5 reps, Supine Shoulder Extension: Both, 5 reps, Supine, PROM Elbow Flexion: Both, 5 reps, Supine, PROM Elbow Extension: Both, 5 reps, Supine, PROM General Exercises - Lower Extremity Ankle Circles/Pumps: PROM, Both, 5 reps, Supine Heel Slides: PROM, Both, 5 reps, Supine Other Exercises Other Exercises: Heel cord stretch bilaterally    General Comments General comments (skin integrity, edema, etc.): 80 bpm, 94% trach on vent with settings 40% FiO2 and PEEP 5 on PSCPAP.  Pre BP 138/83; Post BP 151/90      Pertinent Vitals/Pain Pain Assessment Pain Assessment: No/denies pain    Home Living                          Prior Function            PT Goals (current goals can now be found in the care plan section) Progress towards PT goals: Not progressing toward goals - comment (Pt not following commands)    Frequency    Min 2X/week      PT Plan Current plan remains appropriate    Co-evaluation              AM-PAC PT "6 Clicks" Mobility   Outcome Measure  Help needed turning from your back to your side while in a flat bed without using bedrails?: Total Help needed moving from lying on your back to sitting on the side of a flat bed without using bedrails?: Total Help needed moving to and from a bed to a chair (including a wheelchair)?: Total Help needed standing up from a chair using your arms (e.g., wheelchair or bedside chair)?: Total Help needed to walk in hospital room?: Total Help needed climbing 3-5 steps with a railing? : Total 6 Click Score: 6    End of Session Equipment Utilized During Treatment: Oxygen (Trach on vent) Activity Tolerance: Patient limited by lethargy Patient left: in bed;with call bell/phone within reach;with bed alarm set;with restraints reapplied Nurse Communication: Mobility status;Need for lift  equipment PT Visit Diagnosis: Muscle weakness (generalized) (M62.81);Difficulty in walking, not elsewhere classified (R26.2);Other symptoms and signs involving the nervous system (R29.898)     Time: 1035-1050 PT Time Calculation (min) (ACUTE ONLY): 15 min  Charges:  $Therapeutic Exercise: 8-22 mins                     [facility] M,PT Acute Rehab Services [phone]    Alvira Philips 09/21/2022, 11:04 AM

## 2022-09-21 NOTE — Evaluation (Signed)
[facility] Speaking [facility] - Evaluation Patient Details  Name: Dustin Jones MRN: [id number] Date of Birth: [birth year]  Today's Date: 09/21/2022 Time: 1450-1505 SLP Time Calculation (min) (ACUTE ONLY): 15 min  Past Medical History: History reviewed. No pertinent past medical history. Past Surgical History:  Past Surgical History:  Procedure Laterality Date   IR ANGIO INTRA EXTRACRAN SEL INTERNAL CAROTID BILAT MOD SED  09-19-2022   IR ANGIOGRAM FOLLOW UP STUDY  2022/09/19   IR ANGIOGRAM FOLLOW UP STUDY  September 19, 2022   IR TRANSCATH/EMBOLIZ  09-19-2022   RADIOLOGY WITH ANESTHESIA N/A 09/19/2022   Procedure: IR WITH ANESTHESIA;  Surgeon: Consuella Lose, MD;  Location: [place];  Service: Radiology;  Laterality: N/A;   HPI:  Dustin Jones is a 61 y.o. malewho is homeless, presenting 9/28 after being found down by bystanders. 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 S/P coil embolization of R ICA aneurysm 9/28. Intubated 9/29. Trach placement 10/10. Cortrak 10/11. Transferred to hospitalist service 10/15, worsening resp status 10/16 and transferred back to ICU. No PMH on file.    Assessment / Plan / Recommendation  Clinical Impression  Pt participated in initial PMV assessment.  Has been on TC trials most of this afternoon.  Cuff was deflated and RN present for tracheal suctioning.  VS stable at HR 75, RR 24, Sp02 94%. Valve placed for intervals of 3-5 breath cycles. Dustin Jones had difficulty consistently accessing upper airway. Removal of valve occasionally led to rush of expelled air; at other times, with max verbal/tactile cues, he opened his eyes and vocalized - voice was wet/weak.  SLP will continue PMV trials when he is weaning to [facility]. D/W RN. Will follow. SLP Visit Diagnosis: Aphonia (R49.1)    SLP Assessment  Patient needs continued Speech [place] [facility]     Recommendations for follow up therapy are one component of a multi-disciplinary discharge planning process, led by the attending physician.  Recommendations may be updated based on patient status, additional functional criteria and insurance authorization.  Follow Up Recommendations  Other (comment) (tba)    Assistance Recommended at Discharge Frequent or constant Supervision/Assistance  Functional Status Assessment Patient has had a recent decline in their functional status and demonstrates the ability to make significant improvements in function in a reasonable and predictable amount of time.  Frequency and Duration min 2x/week       PMSV Trial PMSV was placed for: intervals of 3-5 breath cycles Able to redirect subglottic air through upper airway:  (intermittently) Able to Attain Phonation: Yes Voice Quality: Wet;Hoarse;Low vocal intensity Able to Expectorate Secretions: No attempts Breath Support for Phonation: Inadequate Intelligibility: Intelligibility reduced Word: 0-24% accurate Respirations During Trial: 24 SpO2 During Trial: 94 % Pulse During Trial: 75   Tracheostomy Tube    #8 cuffed   Vent Dependency    weaning   Cuff Deflation Trial Tolerated Cuff Deflation: Yes Length of Time for Cuff Deflation Trial: 10 Behavior: Listless  Juliano Mceachin L. Tivis Ringer, MA CCC/SLP Clinical Specialist - Acute Care SLP Acute Rehabilitation Services Office number [phone]        [name] 09/21/2022, 3:18 PM

## 2022-09-21 NOTE — Progress Notes (Signed)
RT placed patient on ATC 40% 10L @1115  per MD. Patient tolerating well at this time. RN aware. RT will continue to monitor.

## 2022-09-22 LAB — CBC WITH DIFFERENTIAL/PLATELET
Abs Immature Granulocytes: 0.8 10*3/uL — ABNORMAL HIGH (ref 0.00–0.07)
Basophils Absolute: 0 10*3/uL (ref 0.0–0.1)
Basophils Relative: 0 %
Eosinophils Absolute: 0.4 10*3/uL (ref 0.0–0.5)
Eosinophils Relative: 1 %
HCT: 36.6 % — ABNORMAL LOW (ref 39.0–52.0)
Hemoglobin: 11.7 g/dL — ABNORMAL LOW (ref 13.0–17.0)
Lymphocytes Relative: 81 %
Lymphs Abs: 32.2 10*3/uL — ABNORMAL HIGH (ref 0.7–4.0)
MCH: 30.4 pg (ref 26.0–34.0)
MCHC: 32 g/dL (ref 30.0–36.0)
MCV: 95.1 fL (ref 80.0–100.0)
Metamyelocytes Relative: 1 %
Monocytes Absolute: 0 10*3/uL — ABNORMAL LOW (ref 0.1–1.0)
Monocytes Relative: 0 %
Myelocytes: 1 %
Neutro Abs: 6.4 10*3/uL (ref 1.7–7.7)
Neutrophils Relative %: 16 %
Platelets: 181 10*3/uL (ref 150–400)
RBC: 3.85 MIL/uL — ABNORMAL LOW (ref 4.22–5.81)
RDW: 13.1 % (ref 11.5–15.5)
WBC: 39.7 10*3/uL — ABNORMAL HIGH (ref 4.0–10.5)
nRBC: 0 % (ref 0.0–0.2)
nRBC: 0 /100 WBC

## 2022-09-22 LAB — BASIC METABOLIC PANEL
Anion gap: 8 (ref 5–15)
BUN: 45 mg/dL — ABNORMAL HIGH (ref 8–23)
CO2: 21 mmol/L — ABNORMAL LOW (ref 22–32)
Calcium: 9 mg/dL (ref 8.9–10.3)
Chloride: 111 mmol/L (ref 98–111)
Creatinine, Ser: 1.06 mg/dL (ref 0.61–1.24)
GFR, Estimated: >60 mL/min (ref >60)
Glucose, Bld: 154 mg/dL — ABNORMAL HIGH (ref 70–99)
Potassium: 4.4 mmol/L (ref 3.5–5.1)
Sodium: 140 mmol/L (ref 135–145)

## 2022-09-22 LAB — CULTURE, RESPIRATORY W GRAM STAIN

## 2022-09-22 LAB — GLUCOSE, CAPILLARY
Glucose-Capillary: 118 mg/dL — ABNORMAL HIGH (ref 70–99)
Glucose-Capillary: 127 mg/dL — ABNORMAL HIGH (ref 70–99)
Glucose-Capillary: 129 mg/dL — ABNORMAL HIGH (ref 70–99)
Glucose-Capillary: 136 mg/dL — ABNORMAL HIGH (ref 70–99)
Glucose-Capillary: 140 mg/dL — ABNORMAL HIGH (ref 70–99)
Glucose-Capillary: 143 mg/dL — ABNORMAL HIGH (ref 70–99)

## 2022-09-22 MED ORDER — FLUOXETINE HCL 20 MG PO CAPS
20.0000 mg | ORAL_CAPSULE | Freq: Every day | ORAL | Status: DC
  Administered 2022-09-22 – 2022-09-28 (×7): 20 mg
  Filled 2022-09-22: qty 2
  Filled 2022-09-22: qty 1
  Filled 2022-09-22 (×2): qty 2
  Filled 2022-09-22: qty 1
  Filled 2022-09-22: qty 2
  Filled 2022-09-22: qty 1

## 2022-09-22 MED ORDER — FUROSEMIDE 10 MG/ML IJ SOLN
40.0000 mg | Freq: Once | INTRAMUSCULAR | Status: AC
  Administered 2022-09-22: 40 mg via INTRAVENOUS
  Filled 2022-09-22: qty 4

## 2022-09-22 MED ORDER — ACETYLCYSTEINE 20 % IN SOLN
3.0000 mL | RESPIRATORY_TRACT | Status: DC
  Administered 2022-09-22 – 2022-09-24 (×13): 3 mL via RESPIRATORY_TRACT
  Filled 2022-09-22 (×16): qty 4

## 2022-09-22 MED ORDER — OXYCODONE HCL 5 MG/5ML PO SOLN
5.0000 mg | Freq: Two times a day (BID) | ORAL | Status: DC | PRN
  Administered 2022-10-06 – 2022-10-22 (×8): 5 mg
  Filled 2022-09-22 (×8): qty 5

## 2022-09-22 MED ORDER — FUROSEMIDE 10 MG/ML IJ SOLN: 40.0000 mg | Freq: Two times a day (BID) | INTRAMUSCULAR | Status: DC

## 2022-09-22 MED ORDER — ALBUTEROL SULFATE (2.5 MG/3ML) 0.083% IN NEBU
2.5000 mg | INHALATION_SOLUTION | RESPIRATORY_TRACT | Status: DC
  Administered 2022-09-22 – 2022-09-26 (×24): 2.5 mg via RESPIRATORY_TRACT
  Filled 2022-09-22 (×24): qty 3

## 2022-09-22 NOTE — Progress Notes (Signed)
Patient seen today by trach team for consult.  No education is needed at this time.  All necessary equipment is at beside.   Will continue to follow for progression.  

## 2022-09-22 NOTE — Progress Notes (Signed)
SLP Cancellation Note  Patient Details Name: Dustin Jones MRN: [id number] DOB: 1961-07-09   Cancelled treatment:       Reason Eval/Treat Not Completed: Medical issues which prohibited therapy  Unable to continue PMSV trials or complete BSE/SLE as pt has been placed back on vent. RN reports pt tolerated being off vent yesterday, but returned to vent due to fatigue. Will continue efforts.  Kamyra Schroeck B. Quentin Ore, [facility], [place] Speech Language Pathologist Office: [phone]  Shonna Chock 09/22/2022, 9:12 AM

## 2022-09-22 NOTE — Progress Notes (Signed)
RT NOTE: RT placed patient on ATC 60% 10L @0814  . RT noted increased WOB and accessory muscle use once on ATC. RT had resident come by and the descion was made to place patient back on the ventilator @0819 . Patient WOB decreased on FS and patient looks comfortable on FS. RT will continue to monitor.

## 2022-09-22 NOTE — Progress Notes (Addendum)
NAME:  Dustin Jones, MRN:  [id number], DOB:  1961/10/24, LOS: 21 ADMISSION DATE:  08/14/2022, CONSULTATION DATE:  08/23/2022 REFERRING MD:  Maryan Rued, EDP, CHIEF COMPLAINT:  ICH   History of Present Illness:  61 year old man who presented to [facility] 9/28 as a Code Stroke. PMHx significant for HTN, DVT and chronic tobacco use.   LKW ~0300 9/28. Found down ~0800 by bystanders who called 911. EMS reported the patient had L-sided deficits and AMS. Systolic BP reported to be in the 190s per EMS. CT Head demonstrated acute intraparenchymal hemorrhage with possible lobulated 72mm outpouching arising from the supraclinoid right ICA, suspicious for ruptured aneurysm. Admitted by Neurosurgery for close monitoring and further diagnostic testing.    PCCM was consulted for ventilator management.   Pertinent  Medical History  HTN, tobacco use, and DVT   Significant Hospital Events: Including procedures, antibiotic start and stop dates in addition to other pertinent events   9/28 admitted. Intracranial Hemorrhage/ R MCA ruptured aneurysm Non-con CT of head demonstrating large acute hemorrhage in the R lentiform nucleus with with intraventricular extension, midline shift, and slight rounding of the temporal horns, possibly representing early hydrocephalus. On CTA, pt found to have a R MCA ruptured aneurysm. Neurosurgery consulted and following with plans for possible arterial clipping this evening. CT of the face and cervical spine did not show any acute traumatic injuries, though BL cervical lymphadenopathy was noted. Echo with normal biventricular function without evidence of hemodynamically sig valvular heart disease.  9/28 complete echo with LVEF of 60-65%, no regional wall motion abnormalities but with grade 1 diastolic dysfunction.  Normal right ventricular systolic function.  No valvular abnormalities. 9/29 MRI brain 1. Large acute intraparenchymal hemorrhage centered in the rightlentiform nucleus, with  extension into the right-greater-than-left lateral ventricle, third ventricle, and fourth ventricle, overall similar to the prior CT. Mild mass effect and 5 mm of right-to-left midline shift. No hydrocephalus. 2. Small areas of acute infarct in the bilateral occipital lobes, bilateral frontal lobes, and right parietal lobe. Spiking fever. Has sig leukocytosis. Cultures sent. Unasyn started for aspiration  9/29 respiratory culture positive for MSSA 9/29 self extubated, required reintubation 10/2 Family identified  10/6 bronchospasm, steroids of this, bronc with clear lungs, ETT 10/9 Neuro exam/mental status remains poor. Coming down on vent requirements. BP improved, remains off of Cleviprex. D/w family re: tracheostomy. 10/10 Remains hypertensive, agitation. Sedation adjusted (Precedex, Fentanyl/Versed PRN). Increased peak pressures on vent. CXR worse with asymmetric pulmonary edema. Lasix given. Trach with CCM. 10/11 Cortrak placed 10/11 ID consulted, abx changed from Cefazolin to Cefepime 10/13 WBC up to 56 (was 49 day prior). CT chest/abd/pelv ordered. 10/15-transferred to the hospitalist service 10/16-Transferred to ICU for worsening respiratory status  10/18: Patient was taken off of ventilator and put on trach collar, patient initially did well, and then started having trouble breathing and was belly breathing, and therefore was back on the vent  Interim History / Subjective:  Overnight patient was transitioned back onto the ventilator.  Patient is still unable to verbally communicate with me.  Patient can follow commands.  Patient tried off ventilator this morning, but patient had increased work of breathing, and therefore was put back on the ventilator.  Objective   Blood pressure (!) 144/81, pulse 76, temperature 99.6 F (37.6 C), temperature source Oral, resp. rate (!) 23, height 5\' 7"  (1.702 m), weight 121 kg, SpO2 94 %.    Vent Mode: PRVC FiO2 (%):  [40 %-50 %] 50 % Set Rate:  [  18  bmp] 18 bmp Vt Set:  [550 mL] 550 mL PEEP:  [5 cmH20] 5 cmH20 Plateau Pressure:  [14 cmH20] 14 cmH20   Intake/Output Summary (Last 24 hours) at 09/22/2022 0834 Last data filed at 09/22/2022 0600 Gross per 24 hour  Intake 1540.07 ml  Output 3000 ml  Net -1459.93 ml   Filed Weights   09/20/22 0500 09/21/22 0500 09/22/22 0359  Weight: 124 kg 122 kg 121 kg    Examination:  General: Patient is in bed with trach on vent Eyes: Pupils equal and reactive to light, patient can open eyes to verbal stimuli Head: Normocephalic, atraumatic  Neck: Trach in place with some secretions noted Cardio: Regular rate and rhythm, no murmurs, rubs or gallops Chest: No chest tenderness Pulmonary: Coarse breath sounds throughout Abdomen: Soft with normoactive bowel sounds Neuro: Nonverbal.  Can follow commands.  Gag reflex intact.  Pupils equal and reactive MSK: Increased grip strength on right side versus left.  Resolved Hospital Problem list   Hypernatremia Hyperkalemia Thrombocytopenia  Assessment & Plan:  This is a 61 year old male with a past medical history of HTN, tobacco use, and DVT who presented with concerns of left side deficits. Patient had an ICH and required arterial coiling. Patient required intubation as patient was not able to protect his airway at the time. Patient has since been extubated and patient has tracheostomy placed. Patient yesterday developed worsening respiratory status and therefore patient was transferred back to the ICU given patient required to go back on the ventilator.   #Acute hypoxemic respiratory failure likely secondary to pneumonia #MSSA and Enterobacter pneumonia Patient initially required intubation, and was extubated after getting tracheostomy 10/11.  Patient's most recent BAL growing rare gram-negative rods.  Patient is off of cefepime now.  Patient required to be put back on ventilator yesterday given his increased work of breathing.  Unsure of why patient  developed worsening respiratory status yesterday.  Trial of trach collar trial this morning, and patient still required increased work of breathing with accessory muscle use and abdomen belly breathing.  Patient is currently on ventilator.  Unsure if this could be related to pneumonia versus pleural effusion.  We will try dose of Lasix today and see if work of breathing decreases.  New BAL culture on 09/19/2022 showing rare gram negative rods.  Continue to follow cultures. -Patient currently on ventilator -Continue to wean ventilator as patient tolerates with oxygenation goal between 88-92 percent -VAP bundle -Pulmonary hygiene -Follow respiratory BAL cultures -Monitor white count -Trend fever curve -Continue Brovana nebs BID -Continue Yupelri nebulization daily -Give 1 dose of 40 mg Lasix today IV -Strict I's and O's -Start mucomyst  #Intracranial hemorrhage/right ICA ruptured aneurysm status post coiling  Patient has had RICA coil embolization of right RICA terminus aneurysm on 2022/09/07.  Patient is stable from neurological standpoint.  Patient does have left-sided weakness noted which is his new baseline.  Overall has remained stable, most recent CT scan of the head was on 10/5 which showed no significant change in size and morphology of the intraparenchymal hemorrhage right basal ganglia, has surrounding vasogenic edema and a 5 mm of right-to-left shift which was similar.  Intraventricular blood in the lateral ventricles were decreased.  PT/OT unable to work with patient currently given current state.  As patient continues to wake up, they can be more involved. -Continue PT/OT -Continue to monitor for any worsening neurological deficits  #Toxic metabolic encephalopathy, resolving Patient is still not fully awake.  He is requiring no sedation.  Patient can follow commands, and attempts to follow commands.  Patient can open eyes to verbal stimuli.  Unsure if this will be his new baseline.   -Discontinue clonazepam -Change oxycodone to every 12 as needed for agitation  #Hypertension Patient's blood pressure currently stable in the 130s.  Patient's goal is 130-150.  Continue with patient's home medication with amlodipine, clonidine, lisinopril, and HCTZ. Goal blood pressure less than 160.  Continue nimodipine for total 21 days and should be discontinued tomorrow. 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 CT chest a/p negative for malignancy, infection  -Continue to monitor WBC periodically   #Hypernatremia, resolved  Sodium 140 this morning -Continue to monitor  #Hyperkalemia, resolved Potassium 4.4 today. -Continue monitoring BMP  #Type 2 diabetes mellitus A1C 6.5%.  Patient's blood glucose may during between 118-141.  Goal 140-180.  Patient is at goal -Continue SSI  #Thrombocytopenia, resolved This is likely lab error or blood clotting that caused what we thought was thrombocytopenia.  Platelets up to 188 today. -No concerns  #Depression Patient does not like to participate, and this is likely secondary to depression.  Could benefit from antidepressant. -Start fluoxetine 20 mg daily per tube  Best Practice (right click and "Reselect all SmartList Selections" daily)   Diet/type: tubefeeds DVT prophylaxis: prophylactic heparin  GI prophylaxis: PPI Lines: N/A Foley:  N/A Code Status:  full code  Labs   CBC: Recent Labs  Lab 09/19/22 0451 09/20/22 0320 09/20/22 1114 09/21/22 0128 09/22/22 0132  WBC 45.0* 47.6* 41.3* 41.3* 39.7*  NEUTROABS 6.9 7.6 6.6 6.4 6.4  HGB 12.1* 12.7* 12.3* 11.3* 11.7*  HCT 39.4 40.5  39.0 36.1* 36.6*  MCV 97.0 97.4 95.1 95.0 95.1  PLT 236 125* 199 208 0000000    Basic Metabolic Panel: Recent Labs  Lab 09/16/22 0635 09/17/22 0736 09/19/22 0451 09/20/22 0320 09/21/22 0128 09/22/22 0132  NA 148* 148* 146* 141 141 140  K 4.3 3.9 4.0 5.6* 4.1 4.4  CL 116* 116* 116* 115* 113* 111  CO2 23 23 23  18* 21* 21*  GLUCOSE 117* 140* 131* 125* 149* 154*  BUN 30* 30* 35* 43* 48* 45*  CREATININE 0.80 0.80 0.86 1.01 1.19 1.06  CALCIUM 8.7* 9.2 9.1 8.9 9.0 9.0  MG 2.2 2.3 2.2 2.2  --   --   PHOS 3.8 3.8 3.7  --   --   --    GFR: Estimated Creatinine Clearance: 91.2 mL/min (by C-G formula based on SCr of 1.06 mg/dL). Recent Labs  Lab 09/20/22 0320 09/20/22 1114 09/20/22 1237 09/21/22 0128 09/22/22 0132  WBC 47.6* 41.3*  --  41.3* 39.7*  LATICACIDVEN  --   --  1.2  --   --     Liver Function Tests: Recent Labs  Lab 09/19/22 0451  AST 42*  ALT 47*  ALKPHOS 56  BILITOT 0.5  PROT 6.7  ALBUMIN 2.7*   No results for input(s): "LIPASE", "AMYLASE" in the last 168 hours. No results for input(s): "AMMONIA" in the last 168 hours.  ABG    Component Value Date/Time   PHART 7.43 09/19/2022 1455   PCO2ART 41 09/19/2022 1455  PO2ART 53 (L) 09/19/2022 1455   HCO3 27.2 09/19/2022 1455   TCO2 23 09/15/2022 0457   ACIDBASEDEF 2.0 09/15/2022 0457   O2SAT 85.5 09/19/2022 1455     Coagulation Profile: No results for input(s): "INR", "PROTIME" in the last 168 hours.  Cardiac Enzymes: No results for input(s): "CKTOTAL", "CKMB", "CKMBINDEX", "TROPONINI" in the last 168 hours.  HbA1C: Hgb A1c MFr Bld  Date/Time Value Ref Range Status  08/26/2022 12:34 PM 6.5 (H) 4.8 - 5.6 % Final    Comment:    (NOTE) Pre diabetes:          5.7%-6.4%  Diabetes:              >6.4%  Glycemic control for   <7.0% adults with diabetes     CBG: Recent Labs  Lab 09/21/22 1520 09/21/22 1937 09/21/22 2350 09/22/22 0342 09/22/22 0751  GLUCAP 125* 141* 136* 118* 127*    Review  of Systems:   Negative except what is stated in HPI.   Past Medical History:  He,  has no past medical history on file.   Surgical History:   Past Surgical History:  Procedure Laterality Date   IR ANGIO INTRA EXTRACRAN SEL INTERNAL CAROTID BILAT MOD SED  08/08/2022   IR ANGIOGRAM FOLLOW UP STUDY  08/24/2022   IR ANGIOGRAM FOLLOW UP STUDY  08/19/2022   IR TRANSCATH/EMBOLIZ  08/15/2022   RADIOLOGY WITH ANESTHESIA N/A 09/02/2022   Procedure: IR WITH ANESTHESIA;  Surgeon: Consuella Lose, MD;  Location: [place];  Service: Radiology;  Laterality: N/A;     Social History:      Family History:  His family history is not on file.   Allergies Not on File   Home Medications  Prior to Admission medications   Medication Sig Start Date End Date Taking? Authorizing Provider  cloNIDine (CATAPRES) 0.3 MG tablet Take 0.3 mg by mouth 2 (two) times daily. 07/07/22   [provider]  cyclobenzaprine (FLEXERIL) 10 MG tablet Take 10 mg by mouth at bedtime as needed for muscle spasms. 07/07/22   [provider]  hydrALAZINE (APRESOLINE) 100 MG tablet Take 100 mg by mouth 3 (three) times daily. 07/07/22   [provider]  lisinopril (ZESTRIL) 40 MG tablet Take 40 mg by mouth daily. 07/07/22   [provider]  meloxicam (MOBIC) 15 MG tablet Take 15 mg by mouth daily as needed for pain. 07/07/22   [provider]  simvastatin (ZOCOR) 20 MG tablet Take 20 mg by mouth at bedtime. 07/07/22   [provider]     Critical care time: [place], DO Internal Medicine Resident PGY-1 Pager: [phone]

## 2022-09-23 LAB — BASIC METABOLIC PANEL
Anion gap: 9 (ref 5–15)
BUN: 40 mg/dL — ABNORMAL HIGH (ref 8–23)
CO2: 25 mmol/L (ref 22–32)
Calcium: 9.3 mg/dL (ref 8.9–10.3)
Chloride: 105 mmol/L (ref 98–111)
Creatinine, Ser: 0.93 mg/dL (ref 0.61–1.24)
GFR, Estimated: >60 mL/min (ref >60)
Glucose, Bld: 152 mg/dL — ABNORMAL HIGH (ref 70–99)
Potassium: 4.2 mmol/L (ref 3.5–5.1)
Sodium: 139 mmol/L (ref 135–145)

## 2022-09-23 LAB — CBC WITH DIFFERENTIAL/PLATELET
Abs Immature Granulocytes: 0 10*3/uL (ref 0.00–0.07)
Basophils Absolute: 0 10*3/uL (ref 0.0–0.1)
Basophils Relative: 0 %
Eosinophils Absolute: 0 10*3/uL (ref 0.0–0.5)
Eosinophils Relative: 0 %
HCT: 39.4 % (ref 39.0–52.0)
Hemoglobin: 12.4 g/dL — ABNORMAL LOW (ref 13.0–17.0)
Lymphocytes Relative: 80 %
Lymphs Abs: 32.9 10*3/uL — ABNORMAL HIGH (ref 0.7–4.0)
MCH: 29.9 pg (ref 26.0–34.0)
MCHC: 31.5 g/dL (ref 30.0–36.0)
MCV: 94.9 fL (ref 80.0–100.0)
Monocytes Absolute: 2.1 10*3/uL — ABNORMAL HIGH (ref 0.1–1.0)
Monocytes Relative: 5 %
Neutro Abs: 6.2 10*3/uL (ref 1.7–7.7)
Neutrophils Relative %: 15 %
Platelets: 175 10*3/uL (ref 150–400)
RBC: 4.15 MIL/uL — ABNORMAL LOW (ref 4.22–5.81)
RDW: 12.9 % (ref 11.5–15.5)
WBC: 41.1 10*3/uL — ABNORMAL HIGH (ref 4.0–10.5)
nRBC: 0 % (ref 0.0–0.2)

## 2022-09-23 LAB — GLUCOSE, CAPILLARY
Glucose-Capillary: 128 mg/dL — ABNORMAL HIGH (ref 70–99)
Glucose-Capillary: 145 mg/dL — ABNORMAL HIGH (ref 70–99)
Glucose-Capillary: 137 mg/dL — ABNORMAL HIGH (ref 70–99)
Glucose-Capillary: 138 mg/dL — ABNORMAL HIGH (ref 70–99)
Glucose-Capillary: 112 mg/dL — ABNORMAL HIGH (ref 70–99)
Glucose-Capillary: 145 mg/dL — ABNORMAL HIGH (ref 70–99)

## 2022-09-23 MED ORDER — FUROSEMIDE 10 MG/ML IJ SOLN
40.0000 mg | Freq: Once | INTRAMUSCULAR | Status: AC
  Administered 2022-09-23: 40 mg via INTRAVENOUS
  Filled 2022-09-23: qty 4

## 2022-09-23 MED ORDER — FREE WATER
200.0000 mL | Freq: Four times a day (QID) | Status: DC
  Administered 2022-09-23 – 2022-10-14 (×79): 200 mL

## 2022-09-23 NOTE — Consult Note (Signed)
Psychiatry consult placed to determine decision making capacity to participate in disposition planning.  On assessment, patient is too somnolent to participate in psychiatric assessment at this time. He sporadically follows commands but I cannot make a determination whether he is choosing to not participate in assessment or too somnolent.  I am unable to determine if patient has decision making capacity because patient is too somnolent to participate in psychiatric assessment.   We will sign off. Please re-consult if patient becomes more alert and there is continued difficulty to determine decision making capacity.   France Ravens, MD PGY2 Psychiatry Resident

## 2022-09-23 NOTE — Progress Notes (Signed)
NAME:  RAGAN REALE, MRN:  [id number], DOB:  [birth year], LOS: 46 ADMISSION DATE:  September 08, 2022, CONSULTATION DATE:  08-Sep-2022 REFERRING MD:  Maryan Rued, EDP, CHIEF COMPLAINT:  ICH   History of Present Illness:  61 year old man who presented to [facility] 9/28 as a Code Stroke. PMHx significant for HTN, DVT and chronic tobacco use.   LKW ~0300 9/28. Found down ~0800 by bystanders who called 911. EMS reported the patient had L-sided deficits and AMS. Systolic BP reported to be in the 190s per EMS. CT Head demonstrated acute intraparenchymal hemorrhage with possible lobulated 69mm outpouching arising from the supraclinoid right ICA, suspicious for ruptured aneurysm. Admitted by Neurosurgery for close monitoring and further diagnostic testing.    PCCM was consulted for ventilator management.   Pertinent  Medical History  HTN, tobacco use, and DVT   Significant Hospital Events: Including procedures, antibiotic start and stop dates in addition to other pertinent events   9/28 admitted. Intracranial Hemorrhage/ R MCA ruptured aneurysm Non-con CT of head demonstrating large acute hemorrhage in the R lentiform nucleus with with intraventricular extension, midline shift, and slight rounding of the temporal horns, possibly representing early hydrocephalus. On CTA, pt found to have a R MCA ruptured aneurysm. Neurosurgery consulted and following with plans for possible arterial clipping this evening. CT of the face and cervical spine did not show any acute traumatic injuries, though BL cervical lymphadenopathy was noted. Echo with normal biventricular function without evidence of hemodynamically sig valvular heart disease.  9/28 complete echo with LVEF of 60-65%, no regional wall motion abnormalities but with grade 1 diastolic dysfunction.  Normal right ventricular systolic function.  No valvular abnormalities. 9/29 MRI brain 1. Large acute intraparenchymal hemorrhage centered in the rightlentiform nucleus, with  extension into the right-greater-than-left lateral ventricle, third ventricle, and fourth ventricle, overall similar to the prior CT. Mild mass effect and 5 mm of right-to-left midline shift. No hydrocephalus. 2. Small areas of acute infarct in the bilateral occipital lobes, bilateral frontal lobes, and right parietal lobe. Spiking fever. Has sig leukocytosis. Cultures sent. Unasyn started for aspiration  9/29 respiratory culture positive for MSSA 9/29 self extubated, required reintubation 10/2 Family identified  10/6 bronchospasm, steroids of this, bronc with clear lungs, ETT 10/9 Neuro exam/mental status remains poor. Coming down on vent requirements. BP improved, remains off of Cleviprex. D/w family re: tracheostomy. 10/10 Remains hypertensive, agitation. Sedation adjusted (Precedex, Fentanyl/Versed PRN). Increased peak pressures on vent. CXR worse with asymmetric pulmonary edema. Lasix given. Trach with CCM. 10/11 Cortrak placed 10/11 ID consulted, abx changed from Cefazolin to Cefepime 10/13 WBC up to 56 (was 49 day prior). CT chest/abd/pelv ordered. 10/15-transferred to the hospitalist service 10/16-Transferred to ICU for worsening respiratory status  10/18: Patient was taken off of ventilator and put on trach collar, patient initially did well, and then started having trouble breathing and was belly breathing, and therefore was back on the vent  Interim History / Subjective:  Overnight patient no concerns.  This morning patient continues to be on trach collar.  Patient not desatting.  Patient can follow commands.  He is unable to talk with me given trach collar.  It is still very hard to awaken him.  Objective   Blood pressure 138/81, pulse 80, temperature 99.7 F (37.6 C), temperature source Oral, resp. rate (!) 21, height 5\' 7"  (1.702 m), weight 121 kg, SpO2 97 %.    Vent Mode: CPAP;PSV FiO2 (%):  [50 %-60 %] 60 % Set Rate:  [18 bmp]  18 bmp Vt Set:  [550 mL] 550 mL PEEP:  [5 cmH20]  5 cmH20 Pressure Support:  [8 cmH20] 8 cmH20 Plateau Pressure:  [15 cmH20] 15 cmH20   Intake/Output Summary (Last 24 hours) at 09/23/2022 0718 Last data filed at 09/23/2022 0600 Gross per 24 hour  Intake 2395 ml  Output 2230 ml  Net 165 ml    Filed Weights   09/20/22 0500 09/21/22 0500 09/22/22 0359  Weight: 124 kg 122 kg 121 kg    Examination:  General: Patient is in bed with trach collar on. Eyes: Pupils equal and reactive to light, patient can open eyes to verbal stimuli Mouth: No more bleeding noted to tongue Head: Normocephalic, atraumatic  Neck: Trach in place with secretions noted Cardio: Regular rate and rhythm, no murmurs, rubs or gallops Chest: No chest tenderness Pulmonary: Coarse breath sounds throughout Abdomen: Soft with normoactive bowel sounds Neuro: Nonverbal.  Can follow commands when he chooses to.  Gag reflex intact.  Pupils equal and reactive MSK: Increased grip strength on right side versus left.  Resolved Hospital Problem list   Hypernatremia Hyperkalemia Thrombocytopenia  Assessment & Plan:  This is a 61 year old male with a past medical history of HTN, tobacco use, and DVT who presented with concerns of left side deficits. Patient had an ICH and required arterial coiling. Patient required intubation as patient was not able to protect his airway at the time. Patient has since been extubated and patient has tracheostomy placed. Patient yesterday developed worsening respiratory status and therefore patient was transferred back to the ICU given patient required to go back on the ventilator.   #Acute hypoxemic respiratory failure likely secondary to pneumonia, resolving  #Enterobacter pneumonia Patient initially required intubation, and was extubated after getting tracheostomy 10/11.  Patient's most recent BAL growing enterobacter. Patient is stable from respiratory standpoint at this time. Patient is doing well with the mucomyst nebs and is bringing up  secretions. Patient is clearing his secretions well. Patient is no longer on antibiotics and there is no indications to start any more at this time.  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  Patient is stable from neurological standpoint.  Patient does have left-sided weakness noted which is his new baseline.  Overall has remained stable, most recent CT scan of the head was on 10/5 which showed no significant change in size and morphology of the intraparenchymal hemorrhage right basal ganglia, has surrounding vasogenic edema and a 5 mm of right-to-left shift which was similar.  Intraventricular blood in the lateral ventricles were decreased.  PT/OT unable to work with patient currently given current state.  As patient continues to wake up, they can be more involved. -Continue PT/OT -Continue to monitor for any worsening neurological deficits  #Toxic metabolic encephalopathy, resolving #Depression Patient is waking up to verbal stimuli when he chooses to.  Patient will follow commands when he changes today.  Unsure of this being the patient's new baseline or not.  Patient did get started on fluoxetine yesterday, and will see if this can help give the patient a new energy.  -Oxycodone to every 12 milligrams as needed -Continue fluoxetine 20 mg daily  #Hypertension Patient's blood pressure currently stable in the 130s.  Patient's goal is 130-150.  Continue with patient's home medication with amlodipine, clonidine, lisinopril, and HCTZ. Goal blood pressure less than 160.  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 CT chest a/p negative for malignancy, infection  -Continue to monitor WBC periodically   #Hypernatremia, resolved  Sodium 139 this morning -Continue to monitor  #Hyperkalemia, resolved Potassium 4.2 today. -Continue monitoring BMP  #Type 2 diabetes mellitus A1C 6.5%.  Patient's blood glucose may during between 129-145.  Goal 140-180.  Patient is at goal -Continue SSI  #Depression Patient does not like to participate, and this is likely secondary to depression.  Could benefit from antidepressant. -Continue fluoxetine 20 mg daily per tube  Best Practice (right click and "Reselect all SmartList Selections" daily)   Diet/type: tubefeeds DVT prophylaxis: prophylactic heparin  GI prophylaxis: PPI Lines: N/A Foley:  N/A Code Status:  full code  Labs   CBC: Recent Labs  Lab 09/20/22 0320 09/20/22 1114 09/21/22 0128 09/22/22 0132 09/23/22 0504  WBC 47.6* 41.3* 41.3* 39.7* 41.1*  NEUTROABS 7.6 6.6 6.4 6.4 6.2  HGB 12.7* 12.3* 11.3* 11.7* 12.4*  HCT 40.5 39.0 36.1* 36.6* 39.4  MCV 97.4 95.1 95.0 95.1 94.9  PLT 125* 199 208 181 175     Basic Metabolic Panel: Recent Labs  Lab 09/17/22 0736 09/19/22 0451 09/20/22 0320 09/21/22 0128 09/22/22 0132 09/23/22 0504  NA 148* 146* 141 141 140 139  K 3.9 4.0 5.6* 4.1 4.4 4.2  CL 116* 116* 115* 113* 111 105  CO2 23 23 18* 21* 21* 25  GLUCOSE 140* 131* 125* 149* 154* 152*  BUN 30* 35* 43* 48* 45* 40*  CREATININE 0.80 0.86 1.01 1.19 1.06 0.93  CALCIUM 9.2 9.1 8.9 9.0 9.0 9.3  MG 2.3 2.2 2.2  --   --   --   PHOS 3.8 3.7  --   --   --   --     GFR: Estimated Creatinine Clearance: 103.9 mL/min (by C-G formula  based on SCr of 0.93 mg/dL). Recent Labs  Lab 09/20/22 1114 09/20/22 1237 09/21/22 0128 09/22/22 0132 09/23/22 0504  WBC 41.3*  --  41.3* 39.7* 41.1*  LATICACIDVEN  --  1.2  --   --   --      Liver Function Tests: Recent Labs  Lab 09/19/22 0451  AST 42*  ALT 47*  ALKPHOS 56  BILITOT 0.5  PROT 6.7  ALBUMIN 2.7*    No results for input(s): "LIPASE", "AMYLASE" in the last 168 hours. No results for input(s): "AMMONIA" in the last 168 hours.  ABG    Component Value Date/Time   PHART 7.43 09/19/2022 1455   PCO2ART 41 09/19/2022 1455   PO2ART 53 (L) 09/19/2022 1455   HCO3 27.2 09/19/2022 1455   TCO2 23 09/15/2022 0457   ACIDBASEDEF 2.0 09/15/2022 0457   O2SAT 85.5 09/19/2022 1455     Coagulation Profile: No results for input(s): "INR", "PROTIME" in the last 168 hours.  Cardiac Enzymes: No results for input(s): "CKTOTAL", "CKMB", "CKMBINDEX", "TROPONINI" in the last 168 hours.  HbA1C: Hgb A1c MFr Bld  Date/Time Value Ref Range Status  09-28-2022 12:34 PM 6.5 (H) 4.8 - 5.6 %  Final    Comment:    (NOTE) Pre diabetes:          5.7%-6.4%  Diabetes:              >6.4%  Glycemic control for   <7.0% adults with diabetes     CBG: Recent Labs  Lab 09/22/22 0751 09/22/22 1131 09/22/22 1538 09/22/22 2000 09/22/22 2339  GLUCAP 127* 143* 129* 140* 136*     Review of Systems:   Negative except what is stated in HPI.   Past Medical History:  He,  has no past medical history on file.   Surgical History:   Past Surgical History:  Procedure Laterality Date   IR ANGIO INTRA EXTRACRAN SEL INTERNAL CAROTID BILAT MOD SED  09-05-22   IR ANGIOGRAM FOLLOW UP STUDY  05-Sep-2022   IR ANGIOGRAM FOLLOW UP STUDY  09/05/2022   IR TRANSCATH/EMBOLIZ  Sep 05, 2022   RADIOLOGY WITH ANESTHESIA N/A 09/05/22   Procedure: IR WITH ANESTHESIA;  Surgeon: Lisbeth Renshaw, MD;  Location: [facility] OR;  Service: Radiology;  Laterality: N/A;     Social History:      Family History:   His family history is not on file.   Allergies Not on File   Home Medications  Prior to Admission medications   Medication Sig Start Date End Date Taking? Authorizing Provider  cloNIDine (CATAPRES) 0.3 MG tablet Take 0.3 mg by mouth 2 (two) times daily. 07/07/22   [provider]  cyclobenzaprine (FLEXERIL) 10 MG tablet Take 10 mg by mouth at bedtime as needed for muscle spasms. 07/07/22   [provider]  hydrALAZINE (APRESOLINE) 100 MG tablet Take 100 mg by mouth 3 (three) times daily. 07/07/22   [provider]  lisinopril (ZESTRIL) 40 MG tablet Take 40 mg by mouth daily. 07/07/22   [provider]  meloxicam (MOBIC) 15 MG tablet Take 15 mg by mouth daily as needed for pain. 07/07/22   [provider]  simvastatin (ZOCOR) 20 MG tablet Take 20 mg by mouth at bedtime. 07/07/22   [provider]     Critical care time: 28    Modena Slater, DO Internal Medicine Resident PGY-1 Pager: [phone]

## 2022-09-23 NOTE — Progress Notes (Signed)
Physical Therapy Treatment Patient Details Name: Dustin Jones MRN: [id number] DOB: September 10, 1961 Today's Date: 09/23/2022   History of Present Illness The pt is a 61 y.o. unidentified homeless male presenting 9/28 after being found down by bystanders. 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 S/P coil embolization of R ICA aneurysm 9/28. Intubated 9/29. Trach placement 10/10. No PMH on file.    PT Comments    Pt admitted with above diagnosis. Pt did track therapist and tech today. Pt moving right UE and LEs as well but not to command. Pt needing total assist to roll and could not get pt to eOB safely. Pt assisted back to supine and called nursing to suction pt as he desaturated and needed assist to change pts linens.  Will continue to follow pt acutely.  Pt currently with functional limitations due to balance and endurance deficits. Pt will benefit from skilled PT to increase their independence and safety with mobility to allow discharge to the venue listed below.      Recommendations for follow up therapy are one component of a multi-disciplinary discharge planning process, led by the attending physician.  Recommendations may be updated based on patient status, additional functional criteria and insurance authorization.  Follow Up Recommendations  Skilled nursing-short term rehab (<3 hours/day) (vs LTACH) Can patient physically be transported by private vehicle: No   Assistance Recommended at Discharge Frequent or constant Supervision/Assistance  Patient can return home with the following Two people to help with walking and/or transfers;Two people to help with bathing/dressing/bathroom;Assistance with cooking/housework;Assistance with feeding;Direct supervision/assist for medications management;Direct supervision/assist for financial management;Help with stairs or ramp for  entrance;Assist for transportation   Equipment Recommendations  Other (comment) (TBA)    Recommendations for Other Services OT consult     Precautions / Restrictions Precautions Precautions: Fall Precaution Comments: trach on vent; SBP < 160; Art-line; flexiseal; cortrak; R wrist restraint Restrictions Weight Bearing Restrictions: No     Mobility  Bed Mobility Overal bed mobility: Needs Assistance Bed Mobility: Rolling, Supine to Sit Rolling: Total assist, +2 for physical assistance, +2 for safety/equipment   Supine to sit: Total assist, +2 for physical assistance     General bed mobility comments: Could not get pt to EOB even with total assist of 2 as he gave no effort.  Total assist +2 either direction in bed with pt reaching for rail and holding on with right hand.  Total assist of 2 to move pt to [facility] and then progressively inclined bed to chair position at 45 degrees and placed pillows under Ues.    Transfers                   General transfer comment: deferred    Ambulation/Gait               General Gait Details: deferred   Stairs             Wheelchair Mobility    Modified Rankin (Stroke Patients Only) Modified Rankin (Stroke Patients Only) Pre-Morbid Rankin Score: No symptoms Modified Rankin: Severe disability     Balance                                            Cognition Arousal/Alertness: Awake/alert Behavior During Therapy: Flat affect Overall Cognitive Status: Impaired/Different  from baseline Area of Impairment: Attention, Following commands, Problem solving                   Current Attention Level: Focused   Following Commands: Follows one step commands inconsistently, Follows one step commands with increased time     Problem Solving: Slow processing General Comments: Pt making eye contact and  did track during session intermittently. Pt following simple cues intermittently and moving right  UE and lower extremity spontaneously. Reached for rail with right hand as well.        Exercises General Exercises - Upper Extremity Shoulder Flexion: PROM, Both, 5 reps, Supine, AAROM Shoulder Extension: Both, 5 reps, Supine, PROM, AAROM Elbow Flexion: 5 reps, Supine, AAROM, Right Elbow Extension: 5 reps, Supine, PROM, AAROM, Right General Exercises - Lower Extremity Ankle Circles/Pumps: PROM, Both, 5 reps, Supine Heel Slides: PROM, Both, 5 reps, Supine, AAROM (moves right LE at times) Other Exercises Other Exercises: Heel cord stretch bilaterally    General Comments General comments (skin integrity, edema, etc.): Pt on 60% trach collar on arrival and sats 94%.  After PT tried to sit pt up and move pt up in bed, sats to 84%with nurse coming in to suction pt and nurse turned pt up to 80% FiO2 for brief time.  Sats 89-90% on 60% on PT departure.  HR stable and BP at end of session 111/71.      Pertinent Vitals/Pain Pain Assessment Pain Assessment: No/denies pain    Home Living                          Prior Function            PT Goals (current goals can now be found in the care plan section) Progress towards PT goals: Not progressing toward goals - comment (Did track today and followed commands intermittently however still unable to sit up)    Frequency    Min 2X/week      PT Plan Current plan remains appropriate    Co-evaluation              AM-PAC PT "6 Clicks" Mobility   Outcome Measure  Help needed turning from your back to your side while in a flat bed without using bedrails?: Total Help needed moving from lying on your back to sitting on the side of a flat bed without using bedrails?: Total Help needed moving to and from a bed to a chair (including a wheelchair)?: Total Help needed standing up from a chair using your arms (e.g., wheelchair or bedside chair)?: Total Help needed to walk in hospital room?: Total Help needed climbing 3-5 steps  with a railing? : Total 6 Click Score: 6    End of Session Equipment Utilized During Treatment: Oxygen (Trach on vent) Activity Tolerance: Patient limited by fatigue Patient left: in bed;with call bell/phone within reach;with bed alarm set;with restraints reapplied Nurse Communication: Mobility status;Need for lift equipment PT Visit Diagnosis: Muscle weakness (generalized) (M62.81);Difficulty in walking, not elsewhere classified (R26.2);Other symptoms and signs involving the nervous system (R29.898)     Time: [id number] PT Time Calculation (min) (ACUTE ONLY): 30 min  Charges:  $Therapeutic Activity: 23-37 mins                     [facility] M,PT Acute Rehab Services [phone]    Alvira Philips 09/23/2022, 3:55 PM

## 2022-09-23 NOTE — Progress Notes (Signed)
Speech Language Pathology Treatment: Nada Boozer Speaking valve  Patient Details Name: Dustin Jones MRN: [id number] DOB: Nov 26, 1961 Today's Date: 09/23/2022 Time: [id number] SLP Time Calculation (min) (ACUTE ONLY): 12 min  Assessment / Plan / Recommendation Clinical Impression  Pt seen for ongoing PMV management.  Pt was asleep on SLP arrival and very difficult to rouse.  With repositioning, pt woke up breifly.  There was expectoration of blood tinged secretions at tracheostomy with cough.  Cuff deflated on arrival. SLP placed PMSV with immediate strong cough noted.  SpO2 dropped from 90 to 86.  Valve removed with pt expectorating additional secretions at trach.  Pt was noted to become drowsy again.  With further trials of 3-5 breaths, there was redirection of air through upper airway and pt exhibited continual moaning with exhalations.  There was intermittent back pressure noted on valve removal. SpO2 remained in high 80s throughout assessment. Pt was unable to remain awake/alert, and further trials were deferred.   Recommend PMV use with SLP only    HPI HPI: Dustin Jones is a 61 y.o. Audrie Gallus is homeless, presenting 9/28 after being found down by bystanders. 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 S/P coil embolization of R ICA aneurysm 9/28. Intubated 9/29. Trach placement 10/10. Cortrak 10/11. Transferred to hospitalist service 10/15, worsening resp status 10/16 and transferred back to ICU. No PMH on file.      SLP Plan  Continue with current plan of care      Recommendations for follow up therapy are one component of a multi-disciplinary discharge planning process, led by the attending physician.  Recommendations may be updated based on patient status, additional functional criteria and insurance authorization.    Recommendations         Patient may use Passy-Muir  Speech Valve: with SLP only         Follow Up Recommendations:  (Continue ST at next level of care, at present) Assistance recommended at discharge: Frequent or constant Supervision/Assistance SLP Visit Diagnosis: Aphonia (R49.1) Plan: Continue with current plan of care           Celedonio Savage, [place], [place] Office: [phone] 09/23/2022, 10:47 AM

## 2022-09-24 LAB — GLUCOSE, CAPILLARY
Glucose-Capillary: 122 mg/dL — ABNORMAL HIGH (ref 70–99)
Glucose-Capillary: 132 mg/dL — ABNORMAL HIGH (ref 70–99)
Glucose-Capillary: 128 mg/dL — ABNORMAL HIGH (ref 70–99)
Glucose-Capillary: 152 mg/dL — ABNORMAL HIGH (ref 70–99)
Glucose-Capillary: 152 mg/dL — ABNORMAL HIGH (ref 70–99)
Glucose-Capillary: 149 mg/dL — ABNORMAL HIGH (ref 70–99)

## 2022-09-24 MED ORDER — NUTRISOURCE FIBER PO PACK
1.0000 | PACK | Freq: Two times a day (BID) | ORAL | Status: DC
  Administered 2022-09-24 – 2022-10-25 (×63): 1
  Filled 2022-09-24 (×64): qty 1

## 2022-09-24 NOTE — Progress Notes (Signed)
NAME:  Dustin Jones, MRN:  [id number], DOB:  19-May-1961, LOS: 25 ADMISSION DATE:  2022/09/02, CONSULTATION DATE:  02-Sep-2022 REFERRING MD:  Maryan Rued, EDP, CHIEF COMPLAINT:  ICH   History of Present Illness:  61 year old man who presented to [facility] 9/28 as a Code Stroke. PMHx significant for HTN, DVT and chronic tobacco use.   Admitted with a right intraparenchymal hemorrhage in the setting of right ICA aneurysm status post coiling.  Course complicated by recurrent pneumonia and respiratory failure requiring tracheostomy He was transferred to the floor 10/15 but transfer back due to respiratory distress on 10/16  Pertinent  Medical History  HTN, tobacco use, and DVT   Significant Hospital Events: Including procedures, antibiotic start and stop dates in addition to other pertinent events   9/28 admitted. Intracranial Hemorrhage/ R MCA ruptured aneurysm Non-con CT of head demonstrating large acute hemorrhage in the R lentiform nucleus with with intraventricular extension, midline shift, and slight rounding of the temporal horns, possibly representing early hydrocephalus. On CTA, pt found to have a R MCA ruptured aneurysm. Neurosurgery consulted and following with plans for possible arterial clipping this evening. CT of the face and cervical spine did not show any acute traumatic injuries, though BL cervical lymphadenopathy was noted. Echo with normal biventricular function without evidence of hemodynamically sig valvular heart disease.  9/28 complete echo with LVEF of 60-65%, no regional wall motion abnormalities but with grade 1 diastolic dysfunction.  Normal right ventricular systolic function.  No valvular abnormalities. 9/29 MRI brain 1. Large acute intraparenchymal hemorrhage centered in the rightlentiform nucleus, with extension into the right-greater-than-left lateral ventricle, third ventricle, and fourth ventricle, overall similar to the prior CT. Mild mass effect and 5 mm of right-to-left  midline shift. No hydrocephalus. 2. Small areas of acute infarct in the bilateral occipital lobes, bilateral frontal lobes, and right parietal lobe. Spiking fever. Has sig leukocytosis. Cultures sent. Unasyn started for aspiration  9/29 respiratory culture positive for MSSA 9/29 self extubated, required reintubation 10/2 Family identified  10/6 bronchospasm, steroids of this, bronc with clear lungs, ETT 10/9 Neuro exam/mental status remains poor. Coming down on vent requirements. BP improved, remains off of Cleviprex. D/w family re: tracheostomy. 10/10 Remains hypertensive, agitation. Sedation adjusted (Precedex, Fentanyl/Versed PRN). Increased peak pressures on vent. CXR worse with asymmetric pulmonary edema. Lasix given. Trach with CCM. 10/11 Cortrak placed 10/11 ID consulted, abx changed from Cefazolin to Cefepime 10/13 WBC up to 56 (was 49 day prior). CT chest/abd/pelv ordered. 10/15-transferred to the hospitalist service 10/16-Transferred to ICU for worsening respiratory status  10/18: Patient was taken off of ventilator and put on trach collar, patient initially did well, and then started having trouble breathing and was belly breathing, and therefore was back on the vent  Interim History / Subjective:   Remains on trach collar , 60% / 10 L Continues to have secretions Unable to tolerate Passy-Muir valve per speech therapy Able to participate with PT  Objective   Blood pressure 124/81, pulse 88, temperature 98.6 F (37 C), temperature source Oral, resp. rate 18, height 5\' 7"  (1.702 m), weight 116 kg, SpO2 93 %.    FiO2 (%):  [60 %] 60 %   Intake/Output Summary (Last 24 hours) at 09/24/2022 1239 Last data filed at 09/24/2022 0700 Gross per 24 hour  Intake 1190.42 ml  Output 2250 ml  Net -1059.58 ml    Filed Weights   09/21/22 0500 09/22/22 0359 09/24/22 0339  Weight: 122 kg 121 kg 116 kg  Examination:  General: Obese man lying supine in bed on trach collar Eyes:  Pupils equal and reactive to light, patient can open eyes to verbal stimuli Mouth: No more bleeding noted to tongue Head: Normocephalic, atraumatic  Neck: Trach in place with secretions noted Cardio: Regular rate and rhythm, no murmurs, rubs or gallops Chest: No accessory muscle use, decreased breath sounds bilateral Abdomen: Soft with normoactive bowel sounds Neuro: Nonverbal.  Admittedly follows commands, left hemiparesis   Chest x-ray 10/15 shows patchy airspace disease in both lower lung fields Labs from 10/19 show severe leukocytosis  Resolved Hospital Problem list   Hypernatremia Hyperkalemia Thrombocytopenia  Assessment & Plan:   Transferred back to ICU 10/16 for Enterobacter pneumonia/HAP Now back to trach collar  #Acute hypoxemic respiratory failure likely secondary to pneumonia #Enterobacter pneumonia  -Continue to decrease FiO2 on trach collar as tolerated -Pulmonary hygiene -Continue Brovana nebs BID -Continue Yupelri nebulization daily  -dc  mucomyst  #Intracranial hemorrhage/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 CT chest a/p negative for malignancy, infection  -Continue to monitor WBC periodically     #Type 2 diabetes mellitus A1C 6.5%.  Patient's blood glucose may during between 129-145.  Goal 140-180.  Patient is at goal -Continue SSI    Best Practice (right click and "Reselect all SmartList Selections" daily)   Diet/type:  tubefeeds DVT prophylaxis: prophylactic heparin  GI prophylaxis: PPI Lines: N/A Foley:  N/A Code Status:  full code  Labs   CBC: Recent Labs  Lab 09/20/22 0320 09/20/22 1114 09/21/22 0128 09/22/22 0132 09/23/22 0504  WBC 47.6* 41.3* 41.3* 39.7* 41.1*  NEUTROABS 7.6 6.6 6.4 6.4 6.2  HGB 12.7* 12.3* 11.3* 11.7* 12.4*  HCT 40.5 39.0 36.1* 36.6* 39.4  MCV 97.4 95.1 95.0 95.1 94.9  PLT 125* 199 208 181 175     Basic Metabolic Panel: Recent Labs  Lab 09/19/22 0451 09/20/22 0320 09/21/22 0128 09/22/22 0132 09/23/22 0504  NA 146* 141 141 140 139  K 4.0 5.6* 4.1 4.4 4.2  CL 116* 115* 113* 111 105  CO2 23 18* 21* 21* 25  GLUCOSE 131* 125* 149* 154* 152*  BUN 35* 43* 48* 45* 40*  CREATININE 0.86 1.01 1.19 1.06 0.93  CALCIUM 9.1 8.9 9.0 9.0 9.3  MG 2.2 2.2  --   --   --   PHOS 3.7  --   --   --   --     GFR: Estimated Creatinine Clearance: 101.6 mL/min (by C-G formula based on SCr of 0.93 mg/dL). Recent Labs  Lab 09/20/22 1114 09/20/22 1237 09/21/22 0128 09/22/22 0132 09/23/22 0504  WBC 41.3*  --  41.3* 39.7* 41.1*  LATICACIDVEN  --  1.2  --   --   --      Liver Function Tests: Recent Labs  Lab 09/19/22 0451  AST 42*  ALT 47*  ALKPHOS 56  BILITOT 0.5  PROT 6.7  ALBUMIN 2.7*    No results for input(s): "LIPASE", "AMYLASE" in the last 168 hours. No results for input(s): "AMMONIA" in  the last 168 hours.  ABG    Component Value Date/Time   PHART 7.43 09/19/2022 1455   PCO2ART 41 09/19/2022 1455   PO2ART 53 (L) 09/19/2022 1455   HCO3 27.2 09/19/2022 1455   TCO2 23 09/15/2022 0457   ACIDBASEDEF 2.0 09/15/2022 0457   O2SAT 85.5 09/19/2022 1455     Coagulation Profile: No results for input(s): "INR", "PROTIME" in the last 168 hours.  Cardiac Enzymes: No results for input(s): "CKTOTAL", "CKMB", "CKMBINDEX", "TROPONINI" in the last 168 hours.  HbA1C: Hgb A1c MFr Bld  Date/Time Value Ref Range Status  09/22/22 12:34 PM 6.5 (H) 4.8 - 5.6 %  Final    Comment:    (NOTE) Pre diabetes:          5.7%-6.4%  Diabetes:              >6.4%  Glycemic control for   <7.0% adults with diabetes     CBG: Recent Labs  Lab 09/23/22 1932 09/23/22 2317 09/24/22 0320 09/24/22 0726 09/24/22 1137  GLUCAP 112* 145* 122* 132* 128*     Critical care time: 32 m    Cyril Mourning MD. FCCP.  Pulmonary & Critical care Pager : [phone]  If no response to pager , please call [phone] until 7 pm After 7:00 pm call Elink  [phone]   09/24/2022

## 2022-09-25 ENCOUNTER — Inpatient Hospital Stay (HOSPITAL_COMMUNITY): Payer: Medicaid Other

## 2022-09-25 DIAGNOSIS — R609 Edema, unspecified: Secondary | ICD-10-CM

## 2022-09-25 LAB — CBC WITH DIFFERENTIAL/PLATELET
Abs Immature Granulocytes: 0.14 10*3/uL — ABNORMAL HIGH (ref 0.00–0.07)
Basophils Absolute: 0.1 10*3/uL (ref 0.0–0.1)
Basophils Relative: 0 %
Eosinophils Absolute: 0.4 10*3/uL (ref 0.0–0.5)
Eosinophils Relative: 1 %
HCT: 40.3 % (ref 39.0–52.0)
Hemoglobin: 12.6 g/dL — ABNORMAL LOW (ref 13.0–17.0)
Immature Granulocytes: 0 %
Lymphocytes Relative: 80 %
Lymphs Abs: 34 10*3/uL — ABNORMAL HIGH (ref 0.7–4.0)
MCH: 29.6 pg (ref 26.0–34.0)
MCHC: 31.3 g/dL (ref 30.0–36.0)
MCV: 94.8 fL (ref 80.0–100.0)
Monocytes Absolute: 1.6 10*3/uL — ABNORMAL HIGH (ref 0.1–1.0)
Monocytes Relative: 4 %
Neutro Abs: 6.3 10*3/uL (ref 1.7–7.7)
Neutrophils Relative %: 15 %
Platelets: 176 10*3/uL (ref 150–400)
RBC: 4.25 MIL/uL (ref 4.22–5.81)
RDW: 12.9 % (ref 11.5–15.5)
WBC: 42.5 10*3/uL — ABNORMAL HIGH (ref 4.0–10.5)
nRBC: 0 % (ref 0.0–0.2)

## 2022-09-25 LAB — BASIC METABOLIC PANEL
Anion gap: 8 (ref 5–15)
BUN: 54 mg/dL — ABNORMAL HIGH (ref 8–23)
CO2: 26 mmol/L (ref 22–32)
Calcium: 9.6 mg/dL (ref 8.9–10.3)
Chloride: 107 mmol/L (ref 98–111)
Creatinine, Ser: 1.18 mg/dL (ref 0.61–1.24)
GFR, Estimated: >60 mL/min (ref >60)
Glucose, Bld: 114 mg/dL — ABNORMAL HIGH (ref 70–99)
Potassium: 4.5 mmol/L (ref 3.5–5.1)
Sodium: 141 mmol/L (ref 135–145)

## 2022-09-25 LAB — GLUCOSE, CAPILLARY
Glucose-Capillary: 123 mg/dL — ABNORMAL HIGH (ref 70–99)
Glucose-Capillary: 137 mg/dL — ABNORMAL HIGH (ref 70–99)
Glucose-Capillary: 140 mg/dL — ABNORMAL HIGH (ref 70–99)
Glucose-Capillary: 150 mg/dL — ABNORMAL HIGH (ref 70–99)
Glucose-Capillary: 176 mg/dL — ABNORMAL HIGH (ref 70–99)
Glucose-Capillary: 153 mg/dL — ABNORMAL HIGH (ref 70–99)

## 2022-09-25 LAB — MAGNESIUM: Magnesium: 2.1 mg/dL (ref 1.7–2.4)

## 2022-09-25 LAB — PHOSPHORUS: Phosphorus: 4.4 mg/dL (ref 2.5–4.6)

## 2022-09-25 MED ORDER — ORAL CARE MOUTH RINSE
15.0000 mL | OROMUCOSAL | Status: DC | PRN
  Administered 2022-10-05: 15 mL via OROMUCOSAL

## 2022-09-25 MED ORDER — ORAL CARE MOUTH RINSE
15.0000 mL | OROMUCOSAL | Status: DC
  Administered 2022-09-25 – 2022-10-05 (×40): 15 mL via OROMUCOSAL

## 2022-09-25 NOTE — Progress Notes (Signed)
Report called to RN on 3w, Lattie Haw called and notified of pt's transfer and room number.

## 2022-09-25 NOTE — Progress Notes (Signed)
Bilateral lower extremity venous duplex completed. Refer to "CV Proc" under chart review to view preliminary results.  09/25/2022 1:22 PM Kelby Aline., MHA, RVT, RDCS, RDMS

## 2022-09-25 NOTE — Progress Notes (Signed)
NAME:  Dustin Jones, MRN:  [id number], DOB:  15-Jul-1961, LOS: 24 ADMISSION DATE:  08/06/2022, CONSULTATION DATE:  08/15/2022 REFERRING MD:  Maryan Rued, EDP, CHIEF COMPLAINT:  ICH   History of Present Illness:  61 year old man who presented to [facility] 9/28 as a Code Stroke. PMHx significant for HTN, DVT and chronic tobacco use.   Admitted with a right intraparenchymal hemorrhage in the setting of right ICA aneurysm status post coiling.  Course complicated by recurrent pneumonia and respiratory failure requiring tracheostomy He was transferred to the floor 10/15 but transfer back due to respiratory distress on 10/16  Pertinent  Medical History  HTN, tobacco use, and DVT   Significant Hospital Events: Including procedures, antibiotic start and stop dates in addition to other pertinent events   9/28 admitted. Intracranial Hemorrhage/ R MCA ruptured aneurysm Non-con CT of head demonstrating large acute hemorrhage in the R lentiform nucleus with with intraventricular extension, midline shift, and slight rounding of the temporal horns, possibly representing early hydrocephalus. On CTA, pt found to have a R MCA ruptured aneurysm. Neurosurgery consulted and following with plans for possible arterial clipping this evening. CT of the face and cervical spine did not show any acute traumatic injuries, though BL cervical lymphadenopathy was noted. Echo with normal biventricular function without evidence of hemodynamically sig valvular heart disease.  9/28 complete echo with LVEF of 60-65%, no regional wall motion abnormalities but with grade 1 diastolic dysfunction.  Normal right ventricular systolic function.  No valvular abnormalities. 9/29 MRI brain 1. Large acute intraparenchymal hemorrhage centered in the rightlentiform nucleus, with extension into the right-greater-than-left lateral ventricle, third ventricle, and fourth ventricle, overall similar to the prior CT. Mild mass effect and 5 mm of right-to-left  midline shift. No hydrocephalus. 2. Small areas of acute infarct in the bilateral occipital lobes, bilateral frontal lobes, and right parietal lobe. Spiking fever. Has sig leukocytosis. Cultures sent. Unasyn started for aspiration  9/29 respiratory culture positive for MSSA 9/29 self extubated, required reintubation 10/2 Family identified  10/6 bronchospasm, steroids of this, bronc with clear lungs, ETT 10/9 Neuro exam/mental status remains poor. Coming down on vent requirements. BP improved, remains off of Cleviprex. D/w family re: tracheostomy. 10/10 Remains hypertensive, agitation. Sedation adjusted (Precedex, Fentanyl/Versed PRN). Increased peak pressures on vent. CXR worse with asymmetric pulmonary edema. Lasix given. Trach with CCM. 10/11 Cortrak placed 10/11 ID consulted, abx changed from Cefazolin to Cefepime 10/13 WBC up to 56 (was 49 day prior). CT chest/abd/pelv ordered. 10/15-transferred to the hospitalist service 10/16-Transferred to ICU for worsening respiratory status  10/18: Patient was taken off of ventilator and put on trach collar, patient initially did well, and then started having trouble breathing and was belly breathing, and therefore was back on the vent 10/20 Unable to tolerate Passy-Muir valve per speech therapy, Able to participate with PT  Interim History / Subjective:   Remains on trach collar , 60% / 10 L Secretions are decreased per RN Afebrile, has not required sedation   Objective   Blood pressure 137/86, pulse 84, temperature 98.6 F (37 C), temperature source Oral, resp. rate 15, height 5\' 7"  (1.702 m), weight 114 kg, SpO2 94 %.    FiO2 (%):  [60 %] 60 %   Intake/Output Summary (Last 24 hours) at 09/25/2022 1100 Last data filed at 09/25/2022 1000 Gross per 24 hour  Intake 1485 ml  Output 2375 ml  Net -890 ml    Filed Weights   09/22/22 0359 09/24/22 0339 09/25/22 0416  Weight: 121 kg 116 kg 114 kg    Examination:  General: Obese man lying  supine in bed on trach collar Eyes: Pupils equal and reactive to light, patient can open eyes to verbal stimuli Mouth: No more bleeding noted to tongue Head: Normocephalic, atraumatic  Neck: Trach 8.5, minimal blood-tinged secretions Cardio: Regular rate and rhythm, no murmurs, rubs or gallops Chest: Decreased breath sounds bilateral, no accessory muscle use Abdomen: Soft with normoactive bowel sounds Neuro: Nonverbal.  Intermittently follows commands, left hemiparesis   Chest x-ray 10/22 low lung volumes, slight improvement in bibasilar atelectasis  Labs show normal electrolytes, leukocytosis as prior  Resolved Hospital Problem list   Hypernatremia Hyperkalemia Thrombocytopenia  Assessment & Plan:   Transferred back to ICU 10/16 for Enterobacter pneumonia/HAP Now back to trach collar  #Acute hypoxemic respiratory failure likely secondary to pneumonia #Enterobacter pneumonia -completed cefepime Tracheostomy  -Remains on high FiO2/60%, no reason to suspect PE but remains high risk, will Doppler lower extremity -Pulmonary hygiene -Continue Brovana nebs BID -Continue Yupelri nebulization daily  -Speech following 4 PM valve   #Intracranial hemorrhage/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 CT chest a/p negative for malignancy, infection  -Continue to monitor WBC periodically     #Type 2 diabetes mellitus A1C 6.5%.  Patient's blood glucose may during between 129-145.  Goal 140-180.  Patient is at  goal -Continue SSI  Okay to transfer to progressive care, PCCM will continue to follow for trach and hypoxia  Best Practice (right click and "Reselect all SmartList Selections" daily)   Diet/type: tubefeeds DVT prophylaxis: prophylactic heparin  GI prophylaxis: PPI Lines: N/A Foley:  N/A Code Status:  full code  Labs   CBC: Recent Labs  Lab 09/20/22 1114 09/21/22 0128 09/22/22 0132 09/23/22 0504 09/25/22 0319  WBC 41.3* 41.3* 39.7* 41.1* 42.5*  NEUTROABS 6.6 6.4 6.4 6.2 6.3  HGB 12.3* 11.3* 11.7* 12.4* 12.6*  HCT 39.0 36.1* 36.6* 39.4 40.3  MCV 95.1 95.0 95.1 94.9 94.8  PLT 199 208 181 175 176     Basic Metabolic Panel: Recent Labs  Lab 09/19/22 0451 09/20/22 0320 09/21/22 0128 09/22/22 0132 09/23/22 0504 09/25/22 0319  NA 146* 141 141 140 139 141  K 4.0 5.6* 4.1 4.4 4.2 4.5  CL 116* 115* 113* 111 105 107  CO2 23 18* 21* 21* 25 26  GLUCOSE 131* 125* 149* 154* 152* 114*  BUN 35* 43* 48* 45* 40* 54*  CREATININE 0.86 1.01 1.19 1.06 0.93 1.18  CALCIUM 9.1 8.9 9.0 9.0 9.3 9.6  MG 2.2 2.2  --   --   --  2.1  PHOS 3.7  --   --   --   --  4.4    GFR: Estimated Creatinine Clearance: 79.3 mL/min (by C-G formula based on SCr of 1.18 mg/dL). Recent Labs  Lab 09/20/22 1237 09/21/22 0128 09/22/22 0132 09/23/22 0504 09/25/22 0319  WBC  --  41.3* 39.7* 41.1* 42.5*  LATICACIDVEN 1.2  --   --   --   --      Liver Function Tests: Recent Labs  Lab 09/19/22 0451  AST 42*  ALT 47*  ALKPHOS 56  BILITOT 0.5  PROT 6.7  ALBUMIN 2.7*    No results for input(s): "LIPASE", "AMYLASE" in the last 168 hours. No results for input(s): "AMMONIA" in the last 168 hours.  ABG    Component Value Date/Time   PHART 7.43 09/19/2022 1455   PCO2ART 41 09/19/2022 1455   PO2ART 53 (L) 09/19/2022 1455   HCO3 27.2 09/19/2022 1455   TCO2 23 09/15/2022 0457   ACIDBASEDEF 2.0 09/15/2022 0457   O2SAT 85.5 09/19/2022 1455     Coagulation Profile: No results for input(s):  "INR", "PROTIME" in the last 168 hours.  Cardiac Enzymes: No results for input(s): "CKTOTAL", "CKMB", "CKMBINDEX", "TROPONINI" in the last 168 hours.  HbA1C: Hgb A1c MFr Bld  Date/Time Value Ref Range Status  08/16/2022 12:34 PM 6.5 (H) 4.8 - 5.6 % Final    Comment:    (NOTE) Pre diabetes:          5.7%-6.4%  Diabetes:              >6.4%  Glycemic control for   <7.0% adults with diabetes     CBG: Recent Labs  Lab 09/24/22 1635 09/24/22 1943 09/24/22 2316 09/25/22 0339 09/25/22 0745  GLUCAP 152* 152* 149* 123* [place] MD. FCCP. [facility] Pager : [phone]  If no response to pager , please call [phone] until 7 pm After 7:00 pm call Elink  [phone]   09/25/2022

## 2022-09-25 NOTE — Progress Notes (Signed)
PROGRESS NOTE   Dustin Jones  WOE:[id number]    DOB: 1961-10-02    DOA: 09/28/22  PCP: Lavinia Sharps, NP   I have briefly reviewed patients previous medical records in [facility].  Chief Complaint  Patient presents with   Code Stroke    Brief Narrative:  PCCM to [facility] transfer 53/64: 61 year old man who presented to [facility] 9/28 as a Code Stroke. PMHx significant for HTN, DVT and chronic tobacco use, LKW ~0300 9/28. Found down ~0800 by bystanders who called 911. EMS reported the patient had L-sided deficits and AMS. Systolic BP reported to be in the 190s per EMS. CT Head demonstrated acute intraparenchymal hemorrhage with possible lobulated 98mm outpouching arising from the supraclinoid right ICA, suspicious for ruptured aneurysm. Admitted by Neurosurgery for close monitoring and further diagnostic testing. PCCM was consulted for ventilator management.  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  S/p tracheostomy by CCM on 10/10 and remains on trach collar.  Cortrack feeds ongoing.   Assessment & Plan:  Principal Problem:   ICH (intracerebral hemorrhage) (HCC) Active Problems:   Intracranial hemorrhage (HCC)   Pressure injury of skin   Acute respiratory failure with hypoxia (HCC)   Aspiration pneumonia of both lungs (HCC)   Hypernatremia   Tracheostomy in place (HCC)   Ventilator associated pneumonia (HCC)   Leukocytosis   Fever   #Acute hypoxemic respiratory failure likely secondary to pneumonia #MSSA and Enterobacter pneumonia Intubated on day of admission.  9/27 self extubated and required reintubation.  After discussing with family, tracheostomy placed by CCM on 10/10.  10/15, transferred out of ICU to [facility].  10/16, transferred back to ICU due to worsening respiratory status and placed back on ventilator.  Has been on trach collar since?  10/19.  CCM managing trach. Tracheal aspirate culture 9/29: MSSA, BAL lavage and culture culture 10/10:  Enterobacter cloacae.  Completed a course of IV cefepime on 10/18. -Continue to wean with oxygenation goal between 88-92 percent -Pulmonary hygiene -Continue Brovana nebs BID, Yupelri nebulization daily, scheduled and as needed albuterol nebs -As per PCCM follow-up 10/22, remains on high FiO2/60%, high risk for PE and checking lower extremity venous Dopplers.   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  Patient is stable from neurological standpoint.  Patient does have left-sided weakness noted which is his new baseline.  Overall has remained stable, most recent CT scan of the head was on 10/16 which showed expected evolution of intraparenchymal hemorrhage centered in the right basal ganglia with no acute abnormality, decreased amount of acute blood in the dependent lateral ventricles and leftward midline shift 4 mm.   PT evaluated 10/18 and recommended SNF.  As per PCCM, 10/20-unable to tolerate PMV per speech therapy.   #Toxic metabolic encephalopathy, resolving Patient is still not fully awake.  He is requiring no sedation.  Patient can follow commands, and attempts to follow commands.  Patient can open eyes to verbal stimuli.  Unsure if this will be his new baseline.  -Discontinued clonazepam -Change oxycodone to every 12 as needed for agitation, may need to further wean off to discontinue as long as he has no pain. -Psychiatry note from 10/20 appreciated and they were unable to evaluate for capacity due to patient being too sleepy.  At the current time, do not feel like he has capacity to make medical decisions.  This will need to be serially evaluated going forward.   #Hypertension Patient's SBPs mostly in the 120s-130s.  Patient's goal is 130-150.  Continue with patient's home medication with amlodipine, clonidine, lisinopril, and HCTZ. Continue nimodipine for total 21 days on  10/20. 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  Off late for the last several days, since 10/17, WBC in the 41 K range.  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 CT chest a/p negative for malignancy, infection  -Continue to monitor WBC periodically   #Hypernatremia, resolved    #Hyperkalemia, resolved    #Type 2 diabetes mellitus A1C 6.5%.  Well-controlled on SSI, continue.  Patient on tube feeds.   #Thrombocytopenia, resolved Single episode on 10/17 which was rechecked and normal.   #Depression Patient does not like to participate, and this is likely secondary to depression.  Could benefit from antidepressant. -Started fluoxetine 20 mg daily per tube  Body mass index is 39.36 kg/m./Obesity  Nutritional Status Nutrition Problem: Inadequate oral intake Etiology: inability to eat Signs/Symptoms: NPO status Interventions: Prostat, Tube feeding  Pressure Ulcer: Pressure Injury 09-08-2022 Pretibial Distal;Right;Lateral Stage 2 -  Partial thickness loss of dermis presenting as a shallow open injury with a red, pink wound bed without slough. Old healing ulcer (Active)  09-08-2022 1630  Location: Pretibial  Location Orientation: Distal;Right;Lateral  Staging: Stage 2 -  Partial thickness loss of dermis presenting as a shallow open injury with a red, pink wound bed without slough.  Wound Description (Comments): Old healing ulcer  Present on Admission: Yes  Dressing Type Foam - Lift dressing to assess site every shift 09/24/22 1940     DVT prophylaxis: heparin injection 5,000 Units Start: 09/03/22 1400 SCD's Start: 09-08-2022 0930     Code Status: Full Code:  Family Communication: None at bedside Disposition:  Status is: Inpatient Remains inpatient appropriate because: Has core track with ongoing tube feeds, still has rectal tube, will have to consider PEG after discussing with  family.     Consultants:   PCCM Infectious disease Nephrology Neurology Psychiatry  Procedures:   As noted above  Antimicrobials:   As noted above.  Currently on no antimicrobials.   Subjective:  Patient briefly opened his eyes, looked at the provider and then closed his eyes and would not open to call.  Nonverbal.  Not following instructions during my visit.  Objective:   Vitals:   09/25/22 0913 09/25/22 1000 09/25/22 1100 09/25/22 1200  BP:  137/86 115/65 127/78  Pulse: 77 84 72 73  Resp: 16 15 14 11   Temp:    98.9 F (37.2 C)  TempSrc:    Oral  SpO2: 91% 94% 90% 93%  Weight:      Height:        General exam: Young male, moderately built and obese lying comfortably propped up in bed without distress. Neck: Has tracheostomy with mild blood-tinged secretions around the tracheostomy.  Has trach collar with oxygen FiO2 of 60% and 10 L/min. ENT: Has core track in the left nostril with tube feeds ongoing Respiratory system: Clear to auscultation anteriorly.  Slightly diminished breath sounds in the bases but rest clear to auscultation.  No increased work of breathing. Cardiovascular system: S1 & S2 heard, RRR. No JVD, murmurs, rubs, gallops or clicks. No pedal edema.  Telemetry personally reviewed: Sinus rhythm. Gastrointestinal system: Abdomen is nondistended, soft and nontender. No organomegaly  or masses felt. Normal bowel sounds heard.  Has rectal tube with soft stools. Central nervous system: Status as noted above. No focal neurological deficits. Extremities: Forceful movements of right upper extremity.  Flaccid left limbs. Skin: No rashes, lesions or ulcers Psychiatry: Judgement and insight impaired. Mood & affect cannot be assessed or flat.     Data Reviewed:   I have personally reviewed following labs and imaging studies   CBC: Recent Labs  Lab 09/22/22 0132 09/23/22 0504 09/25/22 0319  WBC 39.7* 41.1* 42.5*  NEUTROABS 6.4 6.2 6.3  HGB 11.7* 12.4* 12.6*   HCT 36.6* 39.4 40.3  MCV 95.1 94.9 94.8  PLT 181 175 176    Basic Metabolic Panel: Recent Labs  Lab 09/19/22 0451 09/20/22 0320 09/21/22 0128 09/22/22 0132 09/23/22 0504 09/25/22 0319  NA 146* 141 141 140 139 141  K 4.0 5.6* 4.1 4.4 4.2 4.5  CL 116* 115* 113* 111 105 107  CO2 23 18* 21* 21* 25 26  GLUCOSE 131* 125* 149* 154* 152* 114*  BUN 35* 43* 48* 45* 40* 54*  CREATININE 0.86 1.01 1.19 1.06 0.93 1.18  CALCIUM 9.1 8.9 9.0 9.0 9.3 9.6  MG 2.2 2.2  --   --   --  2.1  PHOS 3.7  --   --   --   --  4.4    Liver Function Tests: Recent Labs  Lab 09/19/22 0451  AST 42*  ALT 47*  ALKPHOS 56  BILITOT 0.5  PROT 6.7  ALBUMIN 2.7*    CBG: Recent Labs  Lab 09/25/22 0339 09/25/22 0745 09/25/22 1139  GLUCAP 123* 137* 140*    Microbiology Studies:   Recent Results (from the past 240 hour(s))  Culture, Respiratory w Gram Stain     Status: None   Collection Time: 09/19/22  7:09 PM   Specimen: Bronchoalveolar Lavage; Respiratory  Result Value Ref Range Status   Specimen Description BRONCHIAL ALVEOLAR LAVAGE  Final   Special Requests NONE  Final   Gram Stain   Final    RARE WBC PRESENT, PREDOMINANTLY MONONUCLEAR RARE GRAM VARIABLE ROD Performed at [facility], [address], [place], [place] [address]    Culture FEW ENTEROBACTER CLOACAE  Final   Report Status 09/22/2022 FINAL  Final   Organism ID, Bacteria ENTEROBACTER CLOACAE  Final      Susceptibility   Enterobacter cloacae - MIC*    CEFAZOLIN >=64 RESISTANT Resistant     CEFEPIME >=32 RESISTANT Resistant     CEFTAZIDIME >=64 RESISTANT Resistant     CIPROFLOXACIN <=0.25 SENSITIVE Sensitive     GENTAMICIN <=1 SENSITIVE Sensitive     IMIPENEM <=0.25 SENSITIVE Sensitive     TRIMETH/SULFA <=20 SENSITIVE Sensitive     PIP/TAZO >=128 RESISTANT Resistant     * FEW ENTEROBACTER CLOACAE    Radiology Studies:  DG Chest Port 1 View  Result Date: 09/25/2022 CLINICAL DATA:  stroke EXAM: PORTABLE CHEST - 1  VIEW COMPARISON:  09/18/2022 FINDINGS: Tracheostomy device and feeding tube remain in place. Relatively low lung volumes as before with some perihilar bronchovascular crowding. Linear atelectasis, scarring or infiltrate in the lung bases marginally improved. Heart size and mediastinal contours are within normal limits. No effusion. Visualized bones unremarkable. IMPRESSION: Low volumes with marginal improvement in bibasilar atelectasis/infiltrates. Electronically Signed   By: Corlis Leak M.D.   On: 09/25/2022 07:26    Scheduled Meds:    albuterol  2.5 mg Nebulization Q4H   amLODipine  10  mg Per Tube Daily   arformoterol  15 mcg Nebulization BID   Chlorhexidine Gluconate Cloth  6 each Topical Daily   cloNIDine  0.3 mg Per Tube TID   feeding supplement (PROSource TF20)  60 mL Per Tube BID   fiber  1 packet Per Tube BID   FLUoxetine  20 mg Per Tube Daily   free water  200 mL Per Tube Q6H   heparin injection (subcutaneous)  5,000 Units Subcutaneous Q8H   hydrochlorothiazide  25 mg Per Tube Daily   insulin aspart  0-15 Units Subcutaneous Q4H   lisinopril  40 mg Per Tube Daily   mouth rinse  15 mL Mouth Rinse 4 times per day   pantoprazole  40 mg Per Tube Daily   revefenacin  175 mcg Nebulization Daily    Continuous Infusions:    feeding supplement (OSMOLITE 1.5 CAL) 55 mL/hr at 09/25/22 1200     LOS: 24 days     Marcellus Scott, MD,  FACP, [facility], [facility], [facility], [facility]   [facility] [place]     To contact the attending provider between 7A-7P or the covering provider during after hours 7P-7A, please log into the web site [email or web address] and access using universal [place] password for that web site. If you do not have the password, please call the hospital operator.  09/25/2022, 1:09 PM

## 2022-09-26 LAB — GLUCOSE, CAPILLARY
Glucose-Capillary: 152 mg/dL — ABNORMAL HIGH (ref 70–99)
Glucose-Capillary: 133 mg/dL — ABNORMAL HIGH (ref 70–99)
Glucose-Capillary: 149 mg/dL — ABNORMAL HIGH (ref 70–99)
Glucose-Capillary: 124 mg/dL — ABNORMAL HIGH (ref 70–99)
Glucose-Capillary: 157 mg/dL — ABNORMAL HIGH (ref 70–99)

## 2022-09-26 MED ORDER — ALBUTEROL SULFATE (2.5 MG/3ML) 0.083% IN NEBU: 2.5000 mg | INHALATION_SOLUTION | Freq: Three times a day (TID) | RESPIRATORY_TRACT | Status: DC

## 2022-09-26 MED ORDER — ALBUTEROL SULFATE (2.5 MG/3ML) 0.083% IN NEBU
2.5000 mg | INHALATION_SOLUTION | Freq: Three times a day (TID) | RESPIRATORY_TRACT | Status: DC
  Administered 2022-09-26 – 2022-10-02 (×17): 2.5 mg via RESPIRATORY_TRACT
  Filled 2022-09-26 (×17): qty 3

## 2022-09-26 MED ORDER — ALBUTEROL SULFATE (2.5 MG/3ML) 0.083% IN NEBU
2.5000 mg | INHALATION_SOLUTION | RESPIRATORY_TRACT | Status: DC | PRN
  Filled 2022-09-26: qty 3

## 2022-09-26 NOTE — Progress Notes (Signed)
PROGRESS NOTE   Dustin Jones  WGN:[id number]    DOB: [birth year]    DOA: 09-25-22  PCP: Lavinia Sharps, NP   I have briefly reviewed patients previous medical records in [facility].  Chief Complaint  Patient presents with   Code Stroke    Brief Narrative:  PCCM to [facility] transfer 13/32: 61 year old man who presented to [facility] 9/28 as a Code Stroke. PMHx significant for HTN, DVT and chronic tobacco use, LKW ~0300 9/28. Found down ~0800 by bystanders who called 911. EMS reported the patient had L-sided deficits and AMS. Systolic BP reported to be in the 190s per EMS. CT Head demonstrated acute intraparenchymal hemorrhage with possible lobulated 5mm outpouching arising from the supraclinoid right ICA, suspicious for ruptured aneurysm. Admitted by Neurosurgery for close monitoring and further diagnostic testing. PCCM was consulted for ventilator management.  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  S/p tracheostomy by CCM on 10/10 and remains on trach collar.  Cortrack feeds ongoing.  Transferred to progressive unit 10/22   Assessment & Plan:  Principal Problem:   ICH (intracerebral hemorrhage) (HCC) Active Problems:   Intracranial hemorrhage (HCC)   Pressure injury of skin   Acute respiratory failure with hypoxia (HCC)   Aspiration pneumonia of both lungs (HCC)   Hypernatremia   Tracheostomy in place (HCC)   Ventilator associated pneumonia (HCC)   Leukocytosis   Fever   #Acute hypoxemic respiratory failure likely secondary to pneumonia #MSSA and Enterobacter pneumonia Intubated on day of admission.  9/27 self extubated and required reintubation.  After discussing with family, tracheostomy placed by CCM on 10/10.  10/15, transferred out of ICU to [facility].  10/16, transferred back to ICU due to worsening respiratory status and placed back on ventilator.  Has been on trach collar since?  10/19.  CCM managing trach.  Transferred to progressive unit  10/22. Tracheal aspirate culture 9/29: MSSA, BAL lavage and culture culture 10/10: Enterobacter cloacae.  Completed a course of IV cefepime on 10/18. 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  Patient is stable from neurological standpoint.  Patient does have left-sided weakness noted which is his new baseline.  Overall has remained stable, most recent CT scan of the head was on 10/16 which showed expected evolution of intraparenchymal hemorrhage centered in the right basal ganglia with no acute abnormality, decreased amount of acute blood in the dependent lateral ventricles and leftward midline shift 4 mm.   PT evaluated 10/18 and recommended SNF.  As per PCCM, 10/20-unable to tolerate PMV per speech therapy.  Currently on core track feeding.  Will have SLP reassess, likely unsafe for oral intake, then can discuss with family regarding placement of PEG tube.   #Toxic metabolic encephalopathy, resolving Patient is still not fully awake.  He is requiring no sedation.  Patient can follow commands, and attempts to follow commands.  Patient can open eyes to verbal stimuli.  Unsure if this will be his new baseline.  -Discontinued clonazepam -Change oxycodone to every 12 as needed for agitation, may need to further wean off to discontinue  as long as he has no pain. -Psychiatry note from 10/20 appreciated and they were unable to evaluate for capacity due to patient being too sleepy.  At the current time, do not feel like he has capacity to make medical decisions.  This  will need to be serially evaluated going forward. -Since [facility] resuming care 10/22, patient has been quite somnolent, barely arousable and does not follow commands.   #Hypertension Patient's SBPs mostly in the 120s-130s.  Patient's goal is 130-150.  Continue with patient's home medication with amlodipine, clonidine, lisinopril, and HCTZ.  Completed nimodipine for total 21 days on 10/20. 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  Off late for the last several days, since 10/17, WBC in the 41 K range.  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 CT chest a/p negative for malignancy, infection  -Continue to monitor WBC periodically   #Hypernatremia, resolved    #Hyperkalemia, resolved    #Type 2 diabetes mellitus A1C 6.5%.  Well-controlled on SSI, continue.  Patient on tube feeds.   #Thrombocytopenia, resolved Single episode on 10/17 which was rechecked and normal.   #Depression Patient does not like to participate, and this is likely secondary to depression.  Could benefit from antidepressant. -Started fluoxetine 20 mg daily per tube  Bilateral LE chronic DVT: LEV Dopplers 10/22: Chronic DVT in the right femoral vein and the left common femoral vein.  Likely no anticoagulation, will check with Dr. Vassie Loll.  NSVT: 10/23, around 12:01 AM, 18 beat NSVT.  Potassium and magnesium on 10/22 were okay.  TTE 9/28: LVEF 60-65%.  DC clonidine and start metoprolol 25 Mg twice daily.  Continue telemetry.  Monitor BMP and magnesium periodically and aim to keep K >4 and magnesium >2.  Body mass index is 39.36 kg/m./Obesity  Nutritional Status Nutrition Problem: Inadequate oral intake Etiology: inability to eat Signs/Symptoms: NPO status Interventions: Prostat, Tube feeding  Pressure Ulcer: Pressure Injury 2022-09-14 Pretibial Distal;Right;Lateral Stage 2 -  Partial thickness loss of dermis presenting as  a shallow open injury with a red, pink wound bed without slough. Old healing ulcer (Active)  09/14/2022 1630  Location: Pretibial  Location Orientation: Distal;Right;Lateral  Staging: Stage 2 -  Partial thickness loss of dermis presenting as a shallow open injury with a red, pink wound bed without slough.  Wound Description (Comments): Old healing ulcer  Present on Admission: Yes  Dressing Type Foam - Lift dressing to assess site every shift 09/26/22 0825     DVT prophylaxis: heparin injection 5,000 Units Start: 09/03/22 1400 SCD's Start: 09-14-22 0930     Code Status: Full Code:  Family Communication: None at bedside Disposition:  Status is: Inpatient Remains inpatient appropriate because: Has core track with ongoing tube feeds, still has rectal tube, will have to consider PEG after discussing with family.     Consultants:   PCCM Infectious disease Nephrology Neurology Psychiatry  Procedures:   As noted above  Antimicrobials:   As noted above.  Currently on no antimicrobials.   Subjective:  Seen this morning, somnolent, barely open eyes to call or touch.  No verbal or motor response.  Not following any instructions.  Objective:   Vitals:   09/26/22 0734 09/26/22 0734 09/26/22 1128 09/26/22 1147  BP:  133/81 138/88   Pulse:  70 77 83  Resp:  20 19 20   Temp:  98.7 F (37.1 C) 98.6 F (37  C)   TempSrc:  Axillary Axillary   SpO2: 93% 93% 97% 93%  Weight:      Height:        General exam: Young male, moderately built and obese lying comfortably propped up in bed without distress. Neck: Tracheostomy +.  Has trach collar with oxygen FiO2 of 60% and 10 L/min. ENT: Has core track in the left nostril with tube feeds ongoing Respiratory system: Transmitted upper airway sounds but otherwise clear to auscultation.  No increased work of breathing or use of accessory muscles. Cardiovascular system: S1 & S2 heard, RRR. No JVD, murmurs, rubs, gallops or clicks. No pedal edema.   Telemetry personally reviewed: Sinus rhythm.  Had an 18 beat NSVT at midnight last night. Gastrointestinal system: Abdomen is nondistended, soft and nontender. No organomegaly or masses felt. Normal bowel sounds heard.  Has rectal tube with loose brown stools. Central nervous system: Mental status as noted above. No focal neurological deficits. Extremities: 10/22, had purposeful movements of right upper extremity.  Flaccid left limbs. Skin: No rashes, lesions or ulcers Psychiatry: Judgement and insight impaired. Mood & affect cannot be assessed or flat.     Data Reviewed:   I have personally reviewed following labs and imaging studies   CBC: Recent Labs  Lab 09/22/22 0132 09/23/22 0504 09/25/22 0319  WBC 39.7* 41.1* 42.5*  NEUTROABS 6.4 6.2 6.3  HGB 11.7* 12.4* 12.6*  HCT 36.6* 39.4 40.3  MCV 95.1 94.9 94.8  PLT 181 175 176    Basic Metabolic Panel: Recent Labs  Lab 09/20/22 0320 09/21/22 0128 09/22/22 0132 09/23/22 0504 09/25/22 0319  NA 141 141 140 139 141  K 5.6* 4.1 4.4 4.2 4.5  CL 115* 113* 111 105 107  CO2 18* 21* 21* 25 26  GLUCOSE 125* 149* 154* 152* 114*  BUN 43* 48* 45* 40* 54*  CREATININE 1.01 1.19 1.06 0.93 1.18  CALCIUM 8.9 9.0 9.0 9.3 9.6  MG 2.2  --   --   --  2.1  PHOS  --   --   --   --  4.4    Liver Function Tests: No results for input(s): "AST", "ALT", "ALKPHOS", "BILITOT", "PROT", "ALBUMIN" in the last 168 hours.   CBG: Recent Labs  Lab 09/26/22 0406 09/26/22 0756 09/26/22 1127  GLUCAP 152* 133* 149*    Microbiology Studies:   Recent Results (from the past 240 hour(s))  Culture, Respiratory w Gram Stain     Status: None   Collection Time: 09/19/22  7:09 PM   Specimen: Bronchoalveolar Lavage; Respiratory  Result Value Ref Range Status   Specimen Description BRONCHIAL ALVEOLAR LAVAGE  Final   Special Requests NONE  Final   Gram Stain   Final    RARE WBC PRESENT, PREDOMINANTLY MONONUCLEAR RARE GRAM VARIABLE ROD Performed at  [facility], [address] [place], [place] [address]    Culture FEW ENTEROBACTER CLOACAE  Final   Report Status 09/22/2022 FINAL  Final   Organism ID, Bacteria ENTEROBACTER CLOACAE  Final      Susceptibility   Enterobacter cloacae - MIC*    CEFAZOLIN >=64 RESISTANT Resistant     CEFEPIME >=32 RESISTANT Resistant     CEFTAZIDIME >=64 RESISTANT Resistant     CIPROFLOXACIN <=0.25 SENSITIVE Sensitive     GENTAMICIN <=1 SENSITIVE Sensitive     IMIPENEM <=0.25 SENSITIVE Sensitive     TRIMETH/SULFA <=20 SENSITIVE Sensitive     PIP/TAZO >=128 RESISTANT Resistant     *  FEW ENTEROBACTER CLOACAE    Radiology Studies:  VAS [place] LOWER EXTREMITY VENOUS (DVT)  Result Date: 09/25/2022  Lower Venous DVT Study Patient Name:  Dustin Jones  Date of Exam:   09/25/2022 Medical Rec #: [id number]        Accession #:    [id number] Date of Birth: 10/11/1961        Patient Gender: M Patient Age:   35 years Exam Location:  [facility] Procedure:      VAS [place] LOWER EXTREMITY VENOUS (DVT) Referring Phys: [facility] ALVA --------------------------------------------------------------------------------  Indications: Edema.  Limitations: Poor ultrasound/tissue interface. Comparison Study: No prior study Performing Technologist: Gertie Fey MHA, RDMS, RVT, RDCS  Examination Guidelines: A complete evaluation includes B-mode imaging, spectral Doppler, color Doppler, and power Doppler as needed of all accessible portions of each vessel. Bilateral testing is considered an integral part of a complete examination. Limited examinations for reoccurring indications may be performed as noted. The reflux portion of the exam is performed with the patient in reverse Trendelenburg.  +---------+---------------+---------+-----------+---------------+--------------+ RIGHT    CompressibilityPhasicitySpontaneityProperties     Thrombus Aging +---------+---------------+---------+-----------+---------------+--------------+  CFV      Full           Yes      Yes                                      +---------+---------------+---------+-----------+---------------+--------------+ SFJ      Full                                                             +---------+---------------+---------+-----------+---------------+--------------+ FV Prox  Partial                 Yes                       Chronic        +---------+---------------+---------+-----------+---------------+--------------+ FV Mid   Full                                                             +---------+---------------+---------+-----------+---------------+--------------+ FV Distal                        Yes        patent by color                                                           Doppler                       +---------+---------------+---------+-----------+---------------+--------------+ PFV      Full                                                             +---------+---------------+---------+-----------+---------------+--------------+  POP      Full           Yes      Yes                                      +---------+---------------+---------+-----------+---------------+--------------+ PTV      Full                                                             +---------+---------------+---------+-----------+---------------+--------------+ PERO     Full                                                             +---------+---------------+---------+-----------+---------------+--------------+   +---------+---------------+---------+-----------+----------+--------------+ LEFT     CompressibilityPhasicitySpontaneityPropertiesThrombus Aging +---------+---------------+---------+-----------+----------+--------------+ CFV      Partial        Yes      Yes                  Chronic        +---------+---------------+---------+-----------+----------+--------------+ SFJ      Partial                                                      +---------+---------------+---------+-----------+----------+--------------+ FV Prox  Full                                                        +---------+---------------+---------+-----------+----------+--------------+ FV Mid   Full                                                        +---------+---------------+---------+-----------+----------+--------------+ FV DistalFull                                                        +---------+---------------+---------+-----------+----------+--------------+ PFV      Full                                                        +---------+---------------+---------+-----------+----------+--------------+ POP      Full           Yes      Yes                                 +---------+---------------+---------+-----------+----------+--------------+  PTV      Full                    Yes                                 +---------+---------------+---------+-----------+----------+--------------+   Left Technical Findings: Not visualized segments include peroneal veins.   Summary: RIGHT: - Findings consistent with chronic deep vein thrombosis involving the right femoral vein. - No cystic structure found in the popliteal fossa.  LEFT: - Findings consistent with chronic deep vein thrombosis involving the left common femoral vein. - No cystic structure found in the popliteal fossa.  *See table(s) above for measurements and observations.    Preliminary    DG Chest Port 1 View  Result Date: 09/25/2022 CLINICAL DATA:  stroke EXAM: PORTABLE CHEST - 1 VIEW COMPARISON:  09/18/2022 FINDINGS: Tracheostomy device and feeding tube remain in place. Relatively low lung volumes as before with some perihilar bronchovascular crowding. Linear atelectasis, scarring or infiltrate in the lung bases marginally improved. Heart size and mediastinal contours are within normal limits. No effusion. Visualized bones  unremarkable. IMPRESSION: Low volumes with marginal improvement in bibasilar atelectasis/infiltrates. Electronically Signed   By: Lucrezia Europe M.D.   On: 09/25/2022 07:26    Scheduled Meds:    albuterol  2.5 mg Nebulization TID   amLODipine  10 mg Per Tube Daily   arformoterol  15 mcg Nebulization BID   cloNIDine  0.3 mg Per Tube TID   feeding supplement (PROSource TF20)  60 mL Per Tube BID   fiber  1 packet Per Tube BID   FLUoxetine  20 mg Per Tube Daily   free water  200 mL Per Tube Q6H   heparin injection (subcutaneous)  5,000 Units Subcutaneous Q8H   hydrochlorothiazide  25 mg Per Tube Daily   insulin aspart  0-15 Units Subcutaneous Q4H   lisinopril  40 mg Per Tube Daily   mouth rinse  15 mL Mouth Rinse 4 times per day   pantoprazole  40 mg Per Tube Daily   revefenacin  175 mcg Nebulization Daily    Continuous Infusions:    feeding supplement (OSMOLITE 1.5 CAL) 55 mL/hr at 09/25/22 1600     LOS: 25 days     Vernell Leep, MD,  FACP, [facility], [facility], [facility], [place]     To contact the attending provider between 7A-7P or the covering provider during after hours 7P-7A, please log into the web site [email or web address] and access using universal [place] password for that web site. If you do not have the password, please call the hospital operator.  09/26/2022, 12:05 PM

## 2022-09-26 NOTE — Progress Notes (Addendum)
NAME:  Dustin Jones, MRN:  [id number], DOB:  09-19-1961, LOS: 25 ADMISSION DATE:  September 29, 2022, CONSULTATION DATE:  Sep 29, 2022 REFERRING MD:  Anitra Lauth, EDP, CHIEF COMPLAINT:  ICH   History of Present Illness:  61 year old man who presented to [facility] 9/28 as a Code Stroke. PMHx significant for HTN, DVT and chronic tobacco use.   Admitted with a right intraparenchymal hemorrhage in the setting of right ICA aneurysm status post coiling.  Course complicated by recurrent pneumonia and respiratory failure requiring tracheostomy He was transferred to the floor 10/15 but transfer back due to respiratory distress on 10/16  Pertinent  Medical History  HTN, tobacco use, and DVT   Significant Hospital Events: Including procedures, antibiotic start and stop dates in addition to other pertinent events   9/28 admitted. Intracranial Hemorrhage/ R MCA ruptured aneurysm Non-con CT of head demonstrating large acute hemorrhage in the R lentiform nucleus with with intraventricular extension, midline shift, and slight rounding of the temporal horns, possibly representing early hydrocephalus. On CTA, pt found to have a R MCA ruptured aneurysm. Neurosurgery consulted and following with plans for possible arterial clipping this evening. CT of the face and cervical spine did not show any acute traumatic injuries, though BL cervical lymphadenopathy was noted. Echo with normal biventricular function without evidence of hemodynamically sig valvular heart disease.  9/28 complete echo with LVEF of 60-65%, no regional wall motion abnormalities but with grade 1 diastolic dysfunction.  Normal right ventricular systolic function.  No valvular abnormalities. 9/29 MRI brain 1. Large acute intraparenchymal hemorrhage centered in the rightlentiform nucleus, with extension into the right-greater-than-left lateral ventricle, third ventricle, and fourth ventricle, overall similar to the prior CT. Mild mass effect and 5 mm of right-to-left  midline shift. No hydrocephalus. 2. Small areas of acute infarct in the bilateral occipital lobes, bilateral frontal lobes, and right parietal lobe. Spiking fever. Has sig leukocytosis. Cultures sent. Unasyn started for aspiration  9/29 respiratory culture positive for MSSA 9/29 self extubated, required reintubation 10/2 Family identified  10/6 bronchospasm, steroids of this, bronc with clear lungs, ETT 10/9 Neuro exam/mental status remains poor. Coming down on vent requirements. BP improved, remains off of Cleviprex. D/w family re: tracheostomy. 10/10 Remains hypertensive, agitation. Sedation adjusted (Precedex, Fentanyl/Versed PRN). Increased peak pressures on vent. CXR worse with asymmetric pulmonary edema. Lasix given. Trach with CCM. 10/11 Cortrak placed 10/11 ID consulted, abx changed from Cefazolin to Cefepime 10/13 WBC up to 56 (was 49 day prior). CT chest/abd/pelv ordered. 10/15-transferred to the hospitalist service 10/16-Transferred to ICU for worsening respiratory status  10/18: Patient was taken off of ventilator and put on trach collar, patient initially did well, and then started having trouble breathing and was belly breathing, and therefore was back on the vent 10/20 Unable to tolerate Passy-Muir valve per speech therapy, Able to participate with PT 09/26/2022>> on 60% ATC, tolerating well. Sats 93%  Interim History / Subjective:   Remains on trach collar , 60% / 10 L Secretions are decreased per RN Afebrile, has not required sedation 09/25/2022 CXR shows Low volumes with marginal improvement in bibasilar atelectasis/infiltrates.    Objective   Blood pressure 138/88, pulse 83, temperature 98.6 F (37 C), temperature source Axillary, resp. rate 20, height 5\' 7"  (1.702 m), weight 114 kg, SpO2 93 %.    FiO2 (%):  [60 %] 60 %   Intake/Output Summary (Last 24 hours) at 09/26/2022 1354 Last data filed at 09/26/2022 1051 Gross per 24 hour  Intake 1320 ml  Output  2250 ml   Net -930 ml   Filed Weights   09/22/22 0359 09/24/22 0339 09/25/22 0416  Weight: 121 kg 116 kg 114 kg    Examination:  General: Obese man lying supine in bed on trach collar, in NAD Eyes: Pupils equal and reactive to light, patient can open eyes to verbal stimuli Mouth: No more bleeding noted to tongue Head: Normocephalic, atraumatic , No LAD or JVD, Cor Track with TF infusing Neck: Trach 8.5, minimal blood-tinged secretions Cardio: Regular rate and rhythm, no murmurs, rubs or gallops Chest: Decreased breath sounds bilateral, no accessory muscle use, few rhonchi Abdomen: Soft with normoactive bowel sounds, NT, ND, BS +, Body mass index is 39.36 kg/m. Neuro: Nonverbal.  Intermittently follows commands, left hemiparesis, makes eye contact and waved     No labs 10/23  Doppler Study 09/25/2022 Summary:  RIGHT:  - Findings consistent with chronic deep vein thrombosis involving the  right femoral vein.  - No cystic structure found in the popliteal fossa.     LEFT:  - Findings consistent with chronic deep vein thrombosis involving the left  common femoral vein.  - No cystic structure found in the popliteal fossa.      Resolved Hospital Problem list   Hypernatremia Hyperkalemia Thrombocytopenia  Assessment & Plan:   Transferred back to ICU 10/16 for Enterobacter pneumonia/HAP Now back to trach collar  #Acute hypoxemic respiratory failure likely secondary to pneumonia #Enterobacter pneumonia -completed cefepime Tracheostomy  -Remains on high FiO2/60%, no reason to suspect PE but remains high risk, will  - Continue ATC - Saturation goals are > 88% - Trend CXR - Doppler lower extremity>> Chronic DVT's BLE -Continue Pulmonary hygiene -Continue Brovana nebs BID -Continue Yupelri nebulization daily  -Speech following 4 PM valve    Best Practice (right click and "Reselect all SmartList Selections" daily)   Diet/type: tubefeeds DVT prophylaxis: prophylactic  heparin  GI prophylaxis: PPI Lines: N/A Foley:  N/A Code Status:  full code  Labs   CBC: Recent Labs  Lab 09/20/22 1114 09/21/22 0128 09/22/22 0132 09/23/22 0504 09/25/22 0319  WBC 41.3* 41.3* 39.7* 41.1* 42.5*  NEUTROABS 6.6 6.4 6.4 6.2 6.3  HGB 12.3* 11.3* 11.7* 12.4* 12.6*  HCT 39.0 36.1* 36.6* 39.4 40.3  MCV 95.1 95.0 95.1 94.9 94.8  PLT 199 208 181 175 176    Basic Metabolic Panel: Recent Labs  Lab 09/20/22 0320 09/21/22 0128 09/22/22 0132 09/23/22 0504 09/25/22 0319  NA 141 141 140 139 141  K 5.6* 4.1 4.4 4.2 4.5  CL 115* 113* 111 105 107  CO2 18* 21* 21* 25 26  GLUCOSE 125* 149* 154* 152* 114*  BUN 43* 48* 45* 40* 54*  CREATININE 1.01 1.19 1.06 0.93 1.18  CALCIUM 8.9 9.0 9.0 9.3 9.6  MG 2.2  --   --   --  2.1  PHOS  --   --   --   --  4.4   GFR: Estimated Creatinine Clearance: 79.3 mL/min (by C-G formula based on SCr of 1.18 mg/dL). Recent Labs  Lab 09/20/22 1237 09/21/22 0128 09/22/22 0132 09/23/22 0504 09/25/22 0319  WBC  --  41.3* 39.7* 41.1* 42.5*  LATICACIDVEN 1.2  --   --   --   --     Liver Function Tests: No results for input(s): "AST", "ALT", "ALKPHOS", "BILITOT", "PROT", "ALBUMIN" in the last 168 hours. No results for input(s): "LIPASE", "AMYLASE" in the last 168 hours. No results for input(s): "AMMONIA" in the last 168  hours.  ABG    Component Value Date/Time   PHART 7.43 09/19/2022 1455   PCO2ART 41 09/19/2022 1455   PO2ART 53 (L) 09/19/2022 1455   HCO3 27.2 09/19/2022 1455   TCO2 23 09/15/2022 0457   ACIDBASEDEF 2.0 09/15/2022 0457   O2SAT 85.5 09/19/2022 1455     Coagulation Profile: No results for input(s): "INR", "PROTIME" in the last 168 hours.  Cardiac Enzymes: No results for input(s): "CKTOTAL", "CKMB", "CKMBINDEX", "TROPONINI" in the last 168 hours.  HbA1C: Hgb A1c MFr Bld  Date/Time Value Ref Range Status  Sep 06, 2022 12:34 PM 6.5 (H) 4.8 - 5.6 % Final    Comment:    (NOTE) Pre diabetes:           5.7%-6.4%  Diabetes:              >6.4%  Glycemic control for   <7.0% adults with diabetes     CBG: Recent Labs  Lab 09/25/22 2018 09/25/22 2320 09/26/22 0406 09/26/22 0756 09/26/22 1127  GLUCAP 176* 153* 152* 133* 149*     Magdalen Spatz, MSN, AGACNP-BC [place] for personal pager PCCM on call pager [phone]  If no response to pager , please call [phone] until 7 pm After 7:00 pm call Elink  [phone]   09/26/2022 2:15 PM   Patient seen, examined and discussed with Eric Form as per documentation  No overnight events On trach collar Secretions getting better Nonverbal, does follow some simple commands Minimal blood-tinged secretions around trach Decreased breath sounds bilaterally  Chest x-ray with bibasilar infiltrates, some improvement  Assessment/plan Respiratory failure, on trach collar Appears to be stable Continue pulmonary hygiene Continue bronchodilators Passy-Muir valve as tolerated  Sherrilyn Rist, MD [place] PCCM Pager: See Shea Evans

## 2022-09-26 NOTE — Progress Notes (Signed)
Physical Therapy Treatment Patient Details Name: Dustin Jones MRN: [id number] DOB: 02-04-1961 Today's Date: 09/26/2022   History of Present Illness The pt is a 61 y.o. unidentified homeless male presenting 9/28 after being found down by bystanders. 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 S/P coil embolization of R ICA aneurysm 9/28. Intubated 9/29. Trach placement 10/10. No PMH on file.    PT Comments    Pt continues to be aphasic, demo intermittent simple command follow with R UE/LE, requires totalAx2-3 for safe transfer to EOB, and requires maxA to maintain EOB balance. Pt with strong push to the L with R UE. Tilt bed was ordered for this pt (verbal order from Dr. Algis Liming) in attempt to improve ability to mobilize and change position of patient. Pt continues with L hemiparesis. Acute PT to cont to follow.    Recommendations for follow up therapy are one component of a multi-disciplinary discharge planning process, led by the attending physician.  Recommendations may be updated based on patient status, additional functional criteria and insurance authorization.  Follow Up Recommendations  Skilled nursing-short term rehab (<3 hours/day) (vs LTACH) Can patient physically be transported by private vehicle: No   Assistance Recommended at Discharge Frequent or constant Supervision/Assistance  Patient can return home with the following Two people to help with walking and/or transfers;Two people to help with bathing/dressing/bathroom;Assistance with cooking/housework;Assistance with feeding;Direct supervision/assist for medications management;Direct supervision/assist for financial management;Help with stairs or ramp for entrance;Assist for transportation   Equipment Recommendations  Other (comment) (TBA)    Recommendations for Other Services OT consult     Precautions /  Restrictions Precautions Precautions: Fall Precaution Comments: trach, flexiseal; cortrak; Restrictions Weight Bearing Restrictions: No     Mobility  Bed Mobility Overal bed mobility: Needs Assistance Bed Mobility: Rolling, Supine to Sit Rolling: Total assist, +2 for physical assistance, +2 for safety/equipment   Supine to sit: Total assist, +2 for physical assistance     General bed mobility comments: with HOB elevated PT, RN, and PT tech helicoptered pt to EOB, pt with strong push with R UE requiring RN to hold R UE to allow for RN tech maintain pt in midline while PT worked with L UE and LE.    Transfers                   General transfer comment: deferred, unsafe at this time    Ambulation/Gait               General Gait Details: unable   Stairs             Wheelchair Mobility    Modified Rankin (Stroke Patients Only) Modified Rankin (Stroke Patients Only) Pre-Morbid Rankin Score: No symptoms Modified Rankin: Severe disability     Balance Overall balance assessment: Needs assistance Sitting-balance support: Feet supported, Single extremity supported Sitting balance-Leahy Scale: Zero Sitting balance - Comments: dependent on external assist                                    Cognition Arousal/Alertness: Awake/alert (initially lethargic, once sat EOB pt maintained eyes open) Behavior During Therapy: Flat affect Overall Cognitive Status: Impaired/Different from baseline Area of Impairment: Attention, Following commands, Problem solving                   Current Attention  Level: Focused   Following Commands: Follows one step commands inconsistently, Follows one step commands with increased time     Problem Solving: Slow processing, Decreased initiation, Difficulty sequencing, Requires verbal cues, Requires tactile cues General Comments: Pt making eye contact and  did track during session intermittently. Pt following  simple cues intermittently and moving right UE and lower extremity spontaneously. Reached for rail with right hand as well.        Exercises General Exercises - Lower Extremity Long Arc Quad: AROM, Right, 5 reps, Seated (was max verbal cues, <1/2 the range. passive with L LE)    General Comments General comments (skin integrity, edema, etc.): pt with difficulty managing secretions, + productive cough at bed side, RN present, SPO2 >93%      Pertinent Vitals/Pain Pain Assessment Pain Assessment: Faces Faces Pain Scale: No hurt    Home Living                          Prior Function            PT Goals (current goals can now be found in the care plan section) Acute Rehab PT Goals PT Goal Formulation: Patient unable to participate in goal setting Time For Goal Achievement: 09/28/22 Potential to Achieve Goals: Fair Progress towards PT goals: Not progressing toward goals - comment    Frequency    Min 2X/week      PT Plan Current plan remains appropriate    Co-evaluation              AM-PAC PT "6 Clicks" Mobility   Outcome Measure  Help needed turning from your back to your side while in a flat bed without using bedrails?: Total Help needed moving from lying on your back to sitting on the side of a flat bed without using bedrails?: Total Help needed moving to and from a bed to a chair (including a wheelchair)?: Total Help needed standing up from a chair using your arms (e.g., wheelchair or bedside chair)?: Total Help needed to walk in hospital room?: Total Help needed climbing 3-5 steps with a railing? : Total 6 Click Score: 6    End of Session Equipment Utilized During Treatment: Oxygen Lurline Idol) Activity Tolerance: Patient limited by fatigue Patient left: in bed;with call bell/phone within reach;with bed alarm set;with restraints reapplied Nurse Communication: Mobility status;Need for lift equipment PT Visit Diagnosis: Muscle weakness (generalized)  (M62.81);Difficulty in walking, not elsewhere classified (R26.2);Other symptoms and signs involving the nervous system (R29.898)     Time: 5956-3875 PT Time Calculation (min) (ACUTE ONLY): 24 min  Charges:  $Therapeutic Activity: 8-22 mins $Neuromuscular Re-education: 8-22 mins                     Kittie Plater, PT, DPT Acute Rehabilitation Services Secure chat preferred Office #: [phone]    Berline Lopes 09/26/2022, 2:14 PM

## 2022-09-27 DIAGNOSIS — Z7189 Other specified counseling: Secondary | ICD-10-CM

## 2022-09-27 DIAGNOSIS — I4729 Other ventricular tachycardia: Secondary | ICD-10-CM

## 2022-09-27 DIAGNOSIS — R1310 Dysphagia, unspecified: Secondary | ICD-10-CM

## 2022-09-27 DIAGNOSIS — R197 Diarrhea, unspecified: Secondary | ICD-10-CM

## 2022-09-27 LAB — CBC
HCT: 36.8 % — ABNORMAL LOW (ref 39.0–52.0)
Hemoglobin: 11.9 g/dL — ABNORMAL LOW (ref 13.0–17.0)
MCH: 30.4 pg (ref 26.0–34.0)
MCHC: 32.3 g/dL (ref 30.0–36.0)
MCV: 94.1 fL (ref 80.0–100.0)
Platelets: 177 10*3/uL (ref 150–400)
RBC: 3.91 MIL/uL — ABNORMAL LOW (ref 4.22–5.81)
RDW: 12.6 % (ref 11.5–15.5)
WBC: 41.8 10*3/uL — ABNORMAL HIGH (ref 4.0–10.5)
nRBC: 0 % (ref 0.0–0.2)

## 2022-09-27 LAB — GLUCOSE, CAPILLARY
Glucose-Capillary: 122 mg/dL — ABNORMAL HIGH (ref 70–99)
Glucose-Capillary: 133 mg/dL — ABNORMAL HIGH (ref 70–99)
Glucose-Capillary: 141 mg/dL — ABNORMAL HIGH (ref 70–99)
Glucose-Capillary: 142 mg/dL — ABNORMAL HIGH (ref 70–99)
Glucose-Capillary: 146 mg/dL — ABNORMAL HIGH (ref 70–99)
Glucose-Capillary: 148 mg/dL — ABNORMAL HIGH (ref 70–99)
Glucose-Capillary: 170 mg/dL — ABNORMAL HIGH (ref 70–99)

## 2022-09-27 LAB — BASIC METABOLIC PANEL
Anion gap: 8 (ref 5–15)
BUN: 56 mg/dL — ABNORMAL HIGH (ref 8–23)
CO2: 28 mmol/L (ref 22–32)
Calcium: 9.7 mg/dL (ref 8.9–10.3)
Chloride: 103 mmol/L (ref 98–111)
Creatinine, Ser: 1.31 mg/dL — ABNORMAL HIGH (ref 0.61–1.24)
GFR, Estimated: >60 mL/min (ref >60)
Glucose, Bld: 146 mg/dL — ABNORMAL HIGH (ref 70–99)
Potassium: 4.4 mmol/L (ref 3.5–5.1)
Sodium: 139 mmol/L (ref 135–145)

## 2022-09-27 LAB — MAGNESIUM: Magnesium: 2 mg/dL (ref 1.7–2.4)

## 2022-09-27 MED ORDER — METOPROLOL TARTRATE 25 MG PO TABS
25.0000 mg | ORAL_TABLET | Freq: Two times a day (BID) | ORAL | Status: DC
  Administered 2022-09-27 – 2022-10-18 (×42): 25 mg
  Filled 2022-09-27 (×42): qty 1

## 2022-09-27 NOTE — TOC Progression Note (Signed)
Transition of Care [facility]) - Progression Note    Patient Details  Name: Dustin Jones MRN: [id number] Date of Birth: 29-Oct-1961  Transition of Care [facility]) CM/SW [place], [place] Phone Number: 09/27/2022, 3:30 PM  Clinical Narrative:   CSW following for disposition needs. Patient not medically ready, still with cortrak. Noting palliative discussion with family pending for goals of care. CSW to follow.         Expected Discharge Plan and Services                                                 Social Determinants of Health (SDOH) Interventions    Readmission Risk Interventions     No data to display

## 2022-09-27 NOTE — Evaluation (Signed)
Clinical/Bedside Swallow Evaluation Patient Details  Name: Dustin Jones MRN: [id number] Date of Birth: 1961/03/01  Today's Date: 09/27/2022 Time: SLP Start Time (ACUTE ONLY): 1424 SLP Stop Time (ACUTE ONLY): 1443 SLP Time Calculation (min) (ACUTE ONLY): 19 min  Past Medical History: History reviewed. No pertinent past medical history. Past Surgical History:  Past Surgical History:  Procedure Laterality Date   IR ANGIO INTRA EXTRACRAN SEL INTERNAL CAROTID BILAT MOD SED  09-23-22   IR ANGIOGRAM FOLLOW UP STUDY  09-23-2022   IR ANGIOGRAM FOLLOW UP STUDY  09-23-2022   IR TRANSCATH/EMBOLIZ  23-Sep-2022   RADIOLOGY WITH ANESTHESIA N/A 09/23/2022   Procedure: IR WITH ANESTHESIA;  Surgeon: Lisbeth Renshaw, MD;  Location: [facility] OR;  Service: Radiology;  Laterality: N/A;   HPI:  Dustin Jones is a 61 y.o. male who is homeless, presenting 9/28 after being found down by bystanders. 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 S/P coil embolization of R ICA aneurysm 9/28. Intubated 9/29. Trach placement 10/10. Cortrak 10/11.  No PMH on file.    Assessment / Plan / Recommendation  Clinical Impression  [name] participated in preliminary clinical swallow assessment to determine readiness for an instrumental swallow study.  HOB was elevated and oral care provided. PMV was placed and upper airway patency was assessed.  There was no backflow of air noted upon intermittent removal of valve throughout session. VS remained consistent prior to and during PMV use. Mr Ellner produced occasional involuntary voicing, but otherwise did not speak, despite max visual/verbal/tactile cues provided.   He was offered single ice chips and demonstrated recognition of approaching spoon; however he tended to hold the ice chip in his mouth, requiring cues to masticate.  No swallow response could be appreciated  despite repeated efforts.  Oral suctioning was used intermittently to remove melted ice from mouth.    He did follow some simple commands (stick out your thumb, wiggle toes), but there was no attempt to communicate, either verbally or otherwise. Initiation in general was quite impaired and there was a delayed response time for motor output.  PMV was removed, cleaned, and left to dry in container prior to leaving the room.   Recommend continuing NPO; family may need to consider longer-term enteral feeding if that is aligned with their GOC. Palliative Care is involved in Mr.Shock' care.  SLP will follow for continued PMV use, speech/language evaluation, and ongoing assessment of readiness for instrumental swallow study.   SLP Visit Diagnosis: Dysphagia, oropharyngeal phase (R13.12)    Aspiration Risk  Severe aspiration risk    Diet Recommendation          Other  Recommendations Oral Care Recommendations: Oral care QID    Recommendations for follow up therapy are one component of a multi-disciplinary discharge planning process, led by the attending physician.  Recommendations may be updated based on patient status, additional functional criteria and insurance authorization.  Follow up Recommendations Skilled nursing-short term rehab (<3 hours/day)      Assistance Recommended at Discharge Frequent or constant Supervision/Assistance  Functional Status Assessment Patient has had a recent decline in their functional status and demonstrates the ability to make significant improvements in function in a reasonable and predictable amount of time.  Frequency and Duration min 2x/week  2 weeks       Prognosis Prognosis for Safe Diet Advancement: Fair Barriers to Reach Goals: Cognitive deficits      Swallow Study  General Date of Onset: Sep 24, 2022 HPI: Dustin Jones is a 60 y.o. male who is homeless, presenting 9/28 after being found down by bystanders. Imaging revealed acute bil  intraparenchymal hemorrhage with intraventricular extension and developing hydrocephalus, likely due to rupture of R ICA aneurysm. S/P coil embolization of R ICA aneurysm 9/28. Intubated 9/29. Trach placement 10/10. Cortrak 10/11.  No PMH on file. Type of Study: Bedside Swallow Evaluation Previous Swallow Assessment: no Diet Prior to this Study: NPO Temperature Spikes Noted: No Respiratory Status: Trach;Trach Collar Trach Size and Type: #8;Deflated;With PMSV in place History of Recent Intubation: Yes Length of Intubations (days): 11 days Date extubated:  (trach 10/10) Behavior/Cognition: Alert Oral Cavity Assessment: Within Functional Limits Oral Care Completed by SLP: Yes Oral Cavity - Dentition: Adequate natural dentition Self-Feeding Abilities: Total assist Patient Positioning: Upright in bed Baseline Vocal Quality: Low vocal intensity Volitional Cough: Cognitively unable to elicit Volitional Swallow: Unable to elicit    Oral/Motor/Sensory Function Overall Oral Motor/Sensory Function: Other (comment) (symmetry at baseline; did not f/c)   Ice Chips Ice chips: Impaired Presentation: Spoon Oral Phase Functional Implications: Oral holding Pharyngeal Phase Impairments: Other (comments) (unable to palpate swallow response)   Thin Liquid Thin Liquid: Not tested    Nectar Thick Nectar Thick Liquid: Not tested   Honey Thick Honey Thick Liquid: Not tested   Puree Puree: Not tested   Solid     Solid: Not tested      [name] 09/27/2022,3:58 PM  Estill Bamberg L. Tivis Ringer, MA CCC/SLP Clinical Specialist - [place] Office number [phone]

## 2022-09-27 NOTE — Progress Notes (Addendum)
PROGRESS NOTE   Dustin Jones  BX:[id number]    DOB: 09/06/1961    DOA: 08/22/2022  PCP: Marliss Coots, NP   I have briefly reviewed patients previous medical records in [facility]  Chief Complaint  Patient presents with   Code Stroke    Brief Narrative:  PCCM to [facility] transfer 77/71: 61 year old man who presented to [facility] 9/28 as a Code Stroke. PMHx significant for HTN, DVT and chronic tobacco use, LKW ~0300 9/28. Found down ~0800 by bystanders who called 911. EMS reported the patient had L-sided deficits and AMS. Systolic BP reported to be in the 190s per EMS. CT Head demonstrated acute intraparenchymal hemorrhage with possible lobulated 14mm outpouching arising from the supraclinoid right ICA, suspicious for ruptured aneurysm. Admitted by Neurosurgery for close monitoring and further diagnostic testing. PCCM was consulted for ventilator management.  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  S/p tracheostomy by CCM on 10/10 and remains on trach collar.  Cortrack feeds ongoing.  Transferred to progressive unit 10/22.   Assessment & Plan:  Principal Problem:   ICH (intracerebral hemorrhage) (HCC) Active Problems:   Intracranial hemorrhage (HCC)   Pressure injury of skin   Acute respiratory failure with hypoxia (HCC)   Aspiration pneumonia of both lungs (HCC)   Hypernatremia   Tracheostomy in place (HCC)   Ventilator associated pneumonia (HCC)   Leukocytosis   Fever   #Acute hypoxemic respiratory failure likely secondary to pneumonia #MSSA and Enterobacter pneumonia Intubated on day of admission.  9/27 self extubated and required reintubation.  After discussing with family, tracheostomy placed by CCM on 10/10.  10/15, transferred out of ICU to [facility].  10/16, transferred back to ICU due to worsening respiratory status and placed back on ventilator.  Has been on trach collar since?  10/19.  CCM managing trach.  Transferred to progressive unit  10/22. Tracheal aspirate culture 9/29: MSSA, BAL lavage and culture culture 10/10: Enterobacter cloacae.  Completed a course of IV cefepime on 10/18. -Continue to wean with oxygenation goal between 88-92 percent -Pulmonary hygiene -Continue Brovana nebs BID, Yupelri nebulization daily, scheduled and as needed albuterol nebs -As per PCCM follow-up 10/22, remains on high FiO2/60%, high risk for PE and checked lower extremity venous Dopplers which showed bilateral chronic DVT.  Now down to FiO2 40%, 8 L/min. -As communicated with Dr. Elsworth Soho on 10/23 and Dr.Olalere, PCCM on 10/24, they recommend IVC filter placement.  He will not be a candidate for anticoagulation due to [place].  Discussed risks and benefits of this in detail with patient's daughter, she wishes to discuss this with patient's brother and will update [place].  Addendum: Daughter called back and consented for IVC filter and PEG tube placement.  Placed "IR eval and management" order for these procedures.   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  Patient is stable from neurological standpoint.  Patient does have left-sided weakness noted which is his new baseline.  Overall has remained stable, most recent CT scan of the head was on 10/16 which showed expected evolution of intraparenchymal hemorrhage centered in the right basal ganglia with no acute abnormality, decreased amount of acute blood in the dependent lateral ventricles and leftward midline shift 4 mm.   PT evaluated 10/18 and recommended SNF.  As per PCCM, 10/20-unable to tolerate PMV per speech therapy.  Currently on core track feeding.  SLP reassessed, unsafe for oral intake, PEG tube recommended, discussed with daughter who will review with family and call us back.   #Toxic metabolic encephalopathy, resolving Patient is still not fully awake.  He is requiring no sedation.   Patient can follow commands, and attempts to follow commands.  Patient can open eyes to verbal stimuli.  Unsure if this will be his new baseline.  -Discontinued clonazepam -Change oxycodone to every 12 as needed for agitation, may need to further wean off to discontinue as long as he has no pain. -Psychiatry note from 10/20 appreciated and they were unable to evaluate for capacity due to patient being too sleepy.  At the current time, do not feel like he has capacity to make medical decisions.  This will need to be serially evaluated going forward. -Since [facility] resuming care 10/22, patient has been quite somnolent, barely arousable and does not follow commands.  No significant change today either.  However per nursing, his mental status waxes and wanes and at times he will not to questions.   #Hypertension Patient's SBPs mostly in the 120s-130s.  Patient's goal is 130-150.  Continue with patient's home medication with amlodipine, lisinopril.  Completed nimodipine for total 21 days on 10/20.  Discontinued clonidine and started metoprolol due to NSVT.  Discontinued HCTZ due to mild worsening of creatinine.  If creatinine continues to rise, may have to stop lisinopril as well. 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  Off late for the last several days, since 10/17, WBC in the 41 K range.  Unchanged.  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 CT chest a/p negative for malignancy, infection  -Continue to monitor WBC periodically   #Hypernatremia, resolved    #Hyperkalemia, resolved    #Type 2 diabetes mellitus A1C 6.5%.  Well-controlled on SSI, continue.  Patient on tube feeds.   #Thrombocytopenia, resolved Single episode on 10/17 which was rechecked and normal.   #Depression Patient does not like to participate, and this is likely secondary to depression.  Could benefit from  antidepressant. -Started fluoxetine 20 mg daily per tube  Bilateral LE chronic DVT: LEV Dopplers 10/22: Chronic DVT in the right femoral vein and the left common femoral vein.  Please see discussion above regarding IVC filter placement.  Not an anticoagulation candidate due to [place].  NSVT: 10/23, around 12:01 AM, 18 beat NSVT.  Potassium and magnesium on 10/22 were okay.  TTE 9/28: LVEF 60-65%.  DC clonidine and start metoprolol 25 Mg twice daily.  Continue telemetry.  Monitor BMP and magnesium periodically and aim to keep K >4 and magnesium >2.  No further episodes.  AKI: Creatinine went up from 1.18-1.31.  Continue free water via tube feeding.  Discontinued HCTZ.  Follow BMP closely.  Diarrhea: Has rectal tube with loose stools.  May be related to tube feeds.  Will ask RD if tube feeds can be changed.  Remains on fiber.  Body mass index is 40.02 kg/m./Obesity  Nutritional Status Nutrition Problem: Inadequate oral intake Etiology: inability to eat Signs/Symptoms: NPO status Interventions: Prostat, Tube feeding  Pressure Ulcer: Pressure Injury 08/10/2022 Pretibial Distal;Right;Lateral Stage 2 -  Partial thickness loss of dermis presenting as a shallow open injury with a red, pink wound bed without slough. Old healing ulcer (Active)  08/14/2022 1630  Location: Pretibial  Location Orientation: Distal;Right;Lateral  Staging: Stage 2 -  Partial thickness loss of  dermis presenting as a shallow open injury with a red, pink wound bed without slough.  Wound Description (Comments): Old healing ulcer  Present on Admission: Yes  Dressing Type Foam - Lift dressing to assess site every shift 09/27/22 0815     DVT prophylaxis: heparin injection 5,000 Units Start: 09/03/22 1400 SCD's Start: 08/28/2022 0930     Code Status: Full Code:  Family Communication: Daughter via phone.  Discussed in detail regarding placement of IVC filter and PEG tube, risks and benefits of each and possible adverse effects.   She also stated that she herself had history of PE and wanted to know what was the cause of his bilateral chronic lower extremity DVTs.   Disposition:  Status is: Inpatient Remains inpatient appropriate because: Has core track with ongoing tube feeds, still has rectal tube, will have to consider PEG after discussing with family.     Consultants:   PCCM Infectious disease Nephrology Neurology Psychiatry  Procedures:   As noted above  Antimicrobials:   As noted above.  Currently on no antimicrobials.   Subjective:  Eyes closed, however holding onto the bed rails with his right hand with right hand movements.  Despite calling multiple times when touching him, no eye-opening or verbal response.  Not following instructions.  Objective:   Vitals:   09/27/22 1100 09/27/22 1111 09/27/22 1516 09/27/22 1531  BP: (!) 138/90  132/85   Pulse: 79 79 81 77  Resp: 13     Temp: 98.4 F (36.9 C)  98.1 F (36.7 C)   TempSrc: Axillary  Oral   SpO2:  95%    Weight:      Height:        General exam: Young male, moderately built and obese lying comfortably propped up in bed without distress. Neck: Tracheostomy +.  Has trach collar with oxygen FiO2 of 60 % at 8 L/min ENT: Has core track in the left nostril with tube feeds ongoing Respiratory system: Clear to auscultation.  No increased work of breathing. Cardiovascular system: S1 & S2 heard, RRR. No JVD, murmurs, rubs, gallops or clicks. No pedal edema.  Telemetry personally reviewed: Sinus rhythm.  No further NSVT episodes. Gastrointestinal system: Abdomen is nondistended, soft and nontender. No organomegaly or masses felt. Normal bowel sounds heard.  Has rectal tube with loose brown stools. Central nervous system: Mental status as noted above. No focal neurological deficits. Extremities: As noted above but otherwise no spontaneous movements Skin: No rashes, lesions or ulcers Psychiatry: Judgement and insight impaired. Mood & affect cannot  be assessed or flat.     Data Reviewed:   I have personally reviewed following labs and imaging studies   CBC: Recent Labs  Lab 09/22/22 0132 09/23/22 0504 09/25/22 0319 09/27/22 0554  WBC 39.7* 41.1* 42.5* 41.8*  NEUTROABS 6.4 6.2 6.3  --   HGB 11.7* 12.4* 12.6* 11.9*  HCT 36.6* 39.4 40.3 36.8*  MCV 95.1 94.9 94.8 94.1  PLT 181 175 176 [id number]    Basic Metabolic Panel: Recent Labs  Lab 09/21/22 0128 09/22/22 0132 09/23/22 0504 09/25/22 0319 09/27/22 0554  NA 141 140 139 141 139  K 4.1 4.4 4.2 4.5 4.4  CL 113* 111 105 107 103  CO2 21* 21* 25 26 28   GLUCOSE 149* 154* 152* 114* 146*  BUN 48* 45* 40* 54* 56*  CREATININE 1.19 1.06 0.93 1.18 1.31*  CALCIUM 9.0 9.0 9.3 9.6 9.7  MG  --   --   --  2.1  2.0  PHOS  --   --   --  4.4  --     Liver Function Tests: No results for input(s): "AST", "ALT", "ALKPHOS", "BILITOT", "PROT", "ALBUMIN" in the last 168 hours.   CBG: Recent Labs  Lab 09/27/22 0752 09/27/22 1148 09/27/22 1555  GLUCAP 142* 133* 146*    Microbiology Studies:   Recent Results (from the past 240 hour(s))  Culture, Respiratory w Gram Stain     Status: None   Collection Time: 09/19/22  7:09 PM   Specimen: Bronchoalveolar Lavage; Respiratory  Result Value Ref Range Status   Specimen Description BRONCHIAL ALVEOLAR LAVAGE  Final   Special Requests NONE  Final   Gram Stain   Final    RARE WBC PRESENT, PREDOMINANTLY MONONUCLEAR RARE GRAM VARIABLE ROD Performed at [facility], [place] [address], [place], [place] [address]    Culture FEW ENTEROBACTER CLOACAE  Final   Report Status 09/22/2022 FINAL  Final   Organism ID, Bacteria ENTEROBACTER CLOACAE  Final      Susceptibility   Enterobacter cloacae - MIC*    CEFAZOLIN >=64 RESISTANT Resistant     CEFEPIME >=32 RESISTANT Resistant     CEFTAZIDIME >=64 RESISTANT Resistant     CIPROFLOXACIN <=0.25 SENSITIVE Sensitive     GENTAMICIN <=1 SENSITIVE Sensitive     IMIPENEM <=0.25 SENSITIVE Sensitive      TRIMETH/SULFA <=20 SENSITIVE Sensitive     PIP/TAZO >=128 RESISTANT Resistant     * FEW ENTEROBACTER CLOACAE    Radiology Studies:  No results found.  Scheduled Meds:    albuterol  2.5 mg Nebulization TID   amLODipine  10 mg Per Tube Daily   arformoterol  15 mcg Nebulization BID   cloNIDine  0.3 mg Per Tube TID   feeding supplement (PROSource TF20)  60 mL Per Tube BID   fiber  1 packet Per Tube BID   FLUoxetine  20 mg Per Tube Daily   free water  200 mL Per Tube Q6H   heparin injection (subcutaneous)  5,000 Units Subcutaneous Q8H   hydrochlorothiazide  25 mg Per Tube Daily   insulin aspart  0-15 Units Subcutaneous Q4H   lisinopril  40 mg Per Tube Daily   mouth rinse  15 mL Mouth Rinse 4 times per day   pantoprazole  40 mg Per Tube Daily   revefenacin  175 mcg Nebulization Daily    Continuous Infusions:    feeding supplement (OSMOLITE 1.5 CAL) 1,000 mL (09/27/22 1354)     LOS: 26 days     Vernell Leep, MD,  FACP, FHM, SFHM, [facility], [place]     To contact the attending provider between 7A-7P or the covering provider during after hours 7P-7A, please log into the web site [email or web address] and access using universal [place] password for that web site. If you do not have the password, please call the hospital operator.  09/27/2022, 4:24 PM

## 2022-09-27 NOTE — Consult Note (Signed)
                                                                                   Consultation Note Date: 09/27/2022   Patient Name: Dustin Jones  DOB: Nov 20, 1961  MRN: [id number]  Age / Sex: 61 y.o., male  PCP: [name], [name], NP Referring Physician: Modena Jansky, MD  Reason for Consultation:  ICH, s/p trach, poor mentation  HPI/Patient Profile: 61 y.o. male  with past medical history of hypertension and DVT, houseless, admitted on 09/21/2022 with ICH due to aneurysm, s/p coiling. Recovery complicated by respiratory failure, pneumonia. Has trach, currently on trach collar 60% 10L. Poor mentation, following simple commands intermittently. Cortrak in place. SLP re-eval pending. Palliative medicine consulted for above.    Primary Decision Maker NEXT OF KIN - two children- Dustin Jones and Dustin Jones  Discussion: Chart reviewed including labs, progress notes, imaging.  Evaluated patient- he did not wake or respond to my voice or touch.  Per PT note from yesterday- intermittently followed simple commands with R UE and LE, total assist for mobility.  Called patient's daughter- Dustin. She is the oldest child.  Patient is divorced.  Dustin would like multiple family members included in discussion regarding decision making for patient. She is in [place]. She would like to include patient's brother- Dustin Jones, his son Dustin Jones, and ex wife Dustin Jones.    SUMMARY OF RECOMMENDATIONS -Dustin will discuss with family members and coordinate time for Palliative meeting -PMT contact information given    Code Status/Advance Care Planning: Full code   Prognosis:   Unable to determine  Discharge Planning: To Be Determined  Primary Diagnoses: Present on Admission:  ICH (intracerebral hemorrhage) ([place])   Review of Systems  Physical Exam  Vital Signs: BP (!) 138/90 (BP Location: Left Arm)   Pulse 79   Temp 98.4 F (36.9 C) (Axillary)   Resp 13    Ht 5\' 7"  (1.702 m)   Wt 115.9 kg   SpO2 95%   BMI 40.02 kg/m  Pain Scale: PAINAD   Pain Score: Asleep   SpO2: SpO2: 95 % O2 Device:SpO2: 95 % O2 Flow Rate: .O2 Flow Rate (L/min): 8 L/min  IO: Intake/output summary:  Intake/Output Summary (Last 24 hours) at 09/27/2022 1249 Last data filed at 09/26/2022 2021 Gross per 24 hour  Intake 150 ml  Output --  Net 150 ml    LBM: Last BM Date : 09/26/22 Baseline Weight: Weight: 100 kg Most recent weight: Weight: 115.9 kg       Thank you for this consult. Palliative medicine will continue to follow and assist as needed.   Time Total: 60 minutes  Greater than 50%  of this time was spent counseling and coordinating care related to the above assessment and plan.  Signed by: Mariana Kaufman, AGNP-C Palliative Medicine    Please contact Palliative Medicine Team phone at [phone] for questions and concerns.  For individual provider: See Shea Evans

## 2022-09-27 NOTE — Progress Notes (Signed)
Nutrition Follow-up  DOCUMENTATION CODES:  Obesity unspecified  INTERVENTION:  If it is anticipated that pt will need enteral feeds for >2 more weeks, would recommend considering transition to a PEG tube for pt comfort and to minimize barriers to discharge Continue tube feeds via Cortrak: Osmolite 1.5 @ 55 ml/hr (1320 ml/day) PROSource TF20 60 ml BID Free water flushes per CCM, currently 200 ml q 4 hours Tube feeding regimen provides 2140 kcal, 123g of protein, and 1006 ml of H2O. (Flush+TF=2206) Continue Banatrol TF BID  NUTRITION DIAGNOSIS:  Inadequate oral intake related to inability to eat as evidenced by NPO status. - Ongoing, being addressed via TF  GOAL:  Patient will meet greater than or equal to 90% of their needs - Met via TF  MONITOR:  TF tolerance  REASON FOR ASSESSMENT:  Consult Enteral/tube feeding initiation and management  ASSESSMENT:   Pt with unknown PMH who is reportedly homeless admitted with ICH/R MCA ruptured aneurysm now s/p coiling and acute hypoxic respiratory failure PNA/ALI due to aspiration.  09/29 - TF started 09/30 - TF changed to Nepro (providing 2456 kcal and 127 grams of protein) 10/02 - TF changed to Osmolite 1.5 10/10 - s/p trach placement 10/11 - s/p cortrak placement (tip gastric) 10/12 - weaned to trach collar 10/16 - transferred to ICU, back on vent support 10/18 - Initial PMV assessment 10/22 - Transferred out of ICU  Admit weight: 100 kg Current weight: 115.9 kg  Pt able to be weaned from the vent and moved out of the ICU. Palliative care consulting and attempting to arrange a family meeting to discuss [place] and discharge planning. Pt has had his cortrak tube for ~2 weeks, if it is anticipated that tube will be needed for several more weeks, would consider PEG evaluation. Discussed with PMT.  So far, pt has not tolerated PMV trials. Reviewed I&Os, it appears stool output is improving. Will monitor for the need to adjust TF  regimen.   Nutritionally Relevant Medications: Scheduled Meds:  PROSource TF20  60 mL Per Tube BID   fiber  1 packet Per Tube BID   free water  200 mL Per Tube Q6H   hydrochlorothiazide  25 mg Per Tube Daily   insulin aspart  0-15 Units Subcutaneous Q4H   pantoprazole  40 mg Per Tube Daily   Continuous Infusions:  OSMOLITE 1.5 CAL 1,000 mL (09/27/22 1354)   Labs Reviewed: BUN 56, creatinine 1.31 CBG ranges from 122-170 mg/dL over the last 24 hours  Diet Order:   Diet Order             Diet NPO time specified  Diet effective now                   EDUCATION NEEDS:  No education needs have been identified at this time  Skin:  Skin Assessment: Skin Integrity Issues: Stage II: R leg (pretibial) - old healing ulcer Other: MARSI penis  Last BM:  10/23 - type 7, rectal pouch  Height:  Ht Readings from Last 1 Encounters:  09/18/22 5' 7"  (1.702 m)    Weight:  Wt Readings from Last 1 Encounters:  09/27/22 115.9 kg    BMI:  Body mass index is 40.02 kg/m.  Estimated Nutritional Needs:  Kcal:  2000-2200 Protein:  100-120 grams Fluid:  >2 L/day    Ranell Patrick, RD, LDN Clinical Dietitian RD pager # available in [place]  After hours/weekend pager # available in [facility]

## 2022-09-28 ENCOUNTER — Encounter (HOSPITAL_COMMUNITY): Payer: Self-pay | Admitting: Internal Medicine

## 2022-09-28 DIAGNOSIS — I69320 Aphasia following cerebral infarction: Secondary | ICD-10-CM

## 2022-09-28 LAB — GLUCOSE, CAPILLARY
Glucose-Capillary: 163 mg/dL — ABNORMAL HIGH (ref 70–99)
Glucose-Capillary: 151 mg/dL — ABNORMAL HIGH (ref 70–99)
Glucose-Capillary: 112 mg/dL — ABNORMAL HIGH (ref 70–99)
Glucose-Capillary: 128 mg/dL — ABNORMAL HIGH (ref 70–99)
Glucose-Capillary: 152 mg/dL — ABNORMAL HIGH (ref 70–99)

## 2022-09-28 LAB — RENAL FUNCTION PANEL
Albumin: 3 g/dL — ABNORMAL LOW (ref 3.5–5.0)
Anion gap: 11 (ref 5–15)
BUN: 51 mg/dL — ABNORMAL HIGH (ref 8–23)
CO2: 29 mmol/L (ref 22–32)
Calcium: 10 mg/dL (ref 8.9–10.3)
Chloride: 99 mmol/L (ref 98–111)
Creatinine, Ser: 1.05 mg/dL (ref 0.61–1.24)
GFR, Estimated: >60 mL/min (ref >60)
Glucose, Bld: 129 mg/dL — ABNORMAL HIGH (ref 70–99)
Phosphorus: 4.1 mg/dL (ref 2.5–4.6)
Potassium: 3.9 mmol/L (ref 3.5–5.1)
Sodium: 139 mmol/L (ref 135–145)

## 2022-09-28 MED ORDER — JEVITY 1.5 CAL/FIBER PO LIQD
1000.0000 mL | ORAL | Status: DC
  Administered 2022-09-28 – 2022-10-06 (×6): 1000 mL
  Filled 2022-09-28 (×11): qty 1000

## 2022-09-28 MED ORDER — SCOPOLAMINE 1 MG/3DAYS TD PT72: 1.0000 | MEDICATED_PATCH | TRANSDERMAL | Status: DC

## 2022-09-28 MED ORDER — IOHEXOL 300 MG/ML  SOLN: 100.0000 mL | Freq: Once | INTRAMUSCULAR | Status: DC

## 2022-09-28 MED ORDER — IOHEXOL 300 MG/ML  SOLN: 100.0000 mL | Freq: Once | INTRAMUSCULAR | Status: DC | PRN

## 2022-09-28 NOTE — Progress Notes (Signed)
  Progress Note   Patient: Dustin Jones DOB: 09/02/1961 DOA: 10-Sep-2022     27 DOS: the patient was seen and examined on 09/28/2022 at 10:39AM      Brief hospital course: Dustin Jones is a 61 year old M with HTN, history DVT, smoking who presented with acute stroke  Work-up found intraparenchymal hemorrhage with lobulated outpouching from a supraclinoid right ICA suspicious for aneurysm.  Underwent right ICA coil embolization on 9/28  Tracheostomy 10/10     Assessment and Plan: Acute intraparenchymal hemorrhage Ruptured right ICA aneurysm status post coiling -Continue supportive care - Consult palliative care   Acute metabolic encephalopathy Dysphagia Due to stroke - PEG is planned  Acute respiratory failure with hypoxia due to MSSA and Enterobacter pneumonia Tracheostomy in place Treated with antibiotics and resolved - Continue LAMA/LAMA - Consult Pulmonology, appreciate expertise  Bilateral DVT Not candidate for anticoagulation - IVC filter planned  Hypertension Blood pressure control - Continue amlodipine, lisinopril, metoprolol  Leukemoid reaction versus CLL - Outpatient follow up  Diabetes Glucoses normal - Continue SS corrections  Thrombocytopenia Resolved  Hyponatremia Resolved  Hypokalemia Resolved  NSVT Asymptomatic  AKI ruled out  Morbid obesity BMI 40           Subjective: Patient is nonverbal.  Nursing of no concerns.     Physical Exam: BP 124/88 (BP Location: Right Arm)   Pulse 87   Temp 98.7 F (37.1 C) (Oral)   Resp 12   Ht 5\' 7"  (1.702 m)   Wt 115.9 kg   SpO2 95%   BMI 40.02 kg/m   Adult male, lying in bed, no acute distress, spontaneously moves his right side, no movement of the left side, which is completely flaccid.  Does not make eye contact.  Pupils reactive bilaterally.  Does not follow commands. RRR, no murmurs, no peripheral edema Respiratory rate normal, lungs clear without rales or  wheezes   Data Reviewed: Discussed with interventional radiology and pulmonology Basic metabolic panel unremarkable  Family Communication: Dr. Algis Jones spoke with daughter yesterday, Dr. Ander Jones spoke with mother today.    Disposition: Status is: Inpatient         Author: Edwin Dada, MD 09/28/2022 9:07 PM  For on call review [email or web address].

## 2022-09-28 NOTE — Progress Notes (Signed)
Updated patient's daughter and discussed concerns regarding deep vein thrombosis and the plans for IVC filter placement  She is concerned about care transfers, concerned about the thoroughness of care whenever other physicians take over.  Has spoken to multiple people who probably have addressed things in different ways and just concerned that her dad continues to receive good care  Will appreciate regular updates

## 2022-09-28 NOTE — Progress Notes (Signed)
Nutrition Follow-up  DOCUMENTATION CODES:   Obesity unspecified  INTERVENTION:  If it is anticipated that pt will need enteral feeds for >2 more weeks, would recommend considering transition to a PEG tube for pt comfort and to minimize barriers to discharge Modify tube feeds via Cortrak: Jevity 1.5 @ 55 ml/hr (1320 ml/day) PROSource TF20 60 ml BID Free water flushes per CCM, currently 200 ml q 6 hours Tube feeding regimen provides 2140 kcal, 124g of protein, and 1003 ml of H2O. (Flush+TF=1803) Continue Nutrisource Fiber BID  NUTRITION DIAGNOSIS:   Inadequate oral intake related to inability to eat as evidenced by NPO status.  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  Meds include: sliding scale insulin. Labs reviewed.   MD consult for change in TF formula to help with excessive bowel movements. Pt continues with Cortrak in place. Pt also with flexi seal for diarrhea. In previous RD note, plan was for Osm 1.5 @ 55 mL/hr + Prosource TF20 BID + Banatrol BID. Pt currently has Nutrisource fiber BID ordered instead of Banatrol. RD will modify formula to Jevity 1.5 to provide more fiber. RN reports that the pt is tolerating TF otherwise. Will continue to monitor output.   Diet Order:   Diet Order             Diet NPO time specified  Diet effective now                   EDUCATION NEEDS:   No education needs have been identified at this time  Skin:  Skin Assessment: Skin Integrity Issues: Skin Integrity Issues:: Stage II, Other (Comment) Stage II: R leg (pretibial) - old healing ulcer Other: MARSI penis  Last BM:  10/25 - about 200-300 mL via rectal  tube just today  Height:   Ht Readings from Last 1 Encounters:  09/18/22 5\' 7"  (1.702 m)    Weight:   Wt Readings from Last 1 Encounters:  09/27/22 115.9 kg    Ideal Body Weight:  67.3 kg  BMI:  Body mass index is 40.02 kg/m.  Estimated Nutritional Needs:   Kcal:  2000-2200  Protein:  100-120 grams  Fluid:  >2 L/day  Thalia Bloodgood, RD, LDN, CNSC

## 2022-09-28 NOTE — Progress Notes (Signed)
NAME:  YORDIN RHODA, MRN:  [id number], DOB:  1961-03-14, LOS: 77 ADMISSION DATE:  2022-09-07, CONSULTATION DATE:  09-07-22 REFERRING MD:  Maryan Rued, EDP, CHIEF COMPLAINT:  ICH   History of Present Illness:  61 year old man who presented to [facility] 9/28 as a Code Stroke. PMHx significant for HTN, DVT and chronic tobacco use.   Admitted with a right intraparenchymal hemorrhage in the setting of right ICA aneurysm status post coiling.  Course complicated by recurrent pneumonia and respiratory failure requiring tracheostomy He was transferred to the floor 10/15 but transfer back due to respiratory distress on 10/16  Pertinent  Medical History  HTN, tobacco use, and DVT   Significant Hospital Events: Including procedures, antibiotic start and stop dates in addition to other pertinent events   9/28 admitted. Intracranial Hemorrhage/ R MCA ruptured aneurysm Non-con CT of head demonstrating large acute hemorrhage in the R lentiform nucleus with with intraventricular extension, midline shift, and slight rounding of the temporal horns, possibly representing early hydrocephalus. On CTA, pt found to have a R MCA ruptured aneurysm. Neurosurgery consulted and following with plans for possible arterial clipping this evening. CT of the face and cervical spine did not show any acute traumatic injuries, though BL cervical lymphadenopathy was noted. Echo with normal biventricular function without evidence of hemodynamically sig valvular heart disease.  9/28 complete echo with LVEF of 60-65%, no regional wall motion abnormalities but with grade 1 diastolic dysfunction.  Normal right ventricular systolic function.  No valvular abnormalities. 9/29 MRI brain 1. Large acute intraparenchymal hemorrhage centered in the rightlentiform nucleus, with extension into the right-greater-than-left lateral ventricle, third ventricle, and fourth ventricle, overall similar to the prior CT. Mild mass effect and 5 mm of right-to-left  midline shift. No hydrocephalus. 2. Small areas of acute infarct in the bilateral occipital lobes, bilateral frontal lobes, and right parietal lobe. Spiking fever. Has sig leukocytosis. Cultures sent. Unasyn started for aspiration  9/29 respiratory culture positive for MSSA 9/29 self extubated, required reintubation 10/2 Family identified  10/6 bronchospasm, steroids of this, bronc with clear lungs, ETT 10/9 Neuro exam/mental status remains poor. Coming down on vent requirements. BP improved, remains off of Cleviprex. D/w family re: tracheostomy. 10/10 Remains hypertensive, agitation. Sedation adjusted (Precedex, Fentanyl/Versed PRN). Increased peak pressures on vent. CXR worse with asymmetric pulmonary edema. Lasix given. Trach with CCM. 10/11 Cortrak placed 10/11 ID consulted, abx changed from Cefazolin to Cefepime 10/13 WBC up to 56 (was 49 day prior). CT chest/abd/pelv ordered. 10/15-transferred to the hospitalist service 10/16-Transferred to ICU for worsening respiratory status  10/18: Patient was taken off of ventilator and put on trach collar, patient initially did well, and then started having trouble breathing and was belly breathing, and therefore was back on the vent 10/20 Unable to tolerate Passy-Muir valve per speech therapy, Able to participate with PT 10/22- 09/26/2022>> on 60% ATC, tolerating well. Sats 93% Interim History / Subjective:   On trach collar Secretions better Afebrile Not interactive  Objective   Blood pressure (!) 145/88, pulse 85, temperature 98.6 F (37 C), temperature source Axillary, resp. rate 13, height 5\' 7"  (1.702 m), weight 115.9 kg, SpO2 100 %.    FiO2 (%):  [40 %] 40 %   Intake/Output Summary (Last 24 hours) at 09/28/2022 0806 Last data filed at 09/28/2022 0553 Gross per 24 hour  Intake 210 ml  Output 1750 ml  Net -1540 ml   Filed Weights   09/24/22 0339 09/25/22 0416 09/27/22 0432  Weight: 116 kg 114  kg 115.9 kg     Examination:  General: Chronically ill-appearing, does not appear to be in distress  eyes: Pupils equal and reactive Head: Normocephalic, atraumatic Neck: Tracheostomy tube in place Cardio: S1-S2 appreciated Chest: Minimal rhonchi  abdomen: Bowel sounds appreciated Neuro: Nonverbal  Chem-7 with BUN 51, creatinine 1.05 White count 41.8-stable  Doppler Study 09/25/2022 Summary:  RIGHT:  - Findings consistent with chronic deep vein thrombosis involving the  right femoral vein.  - No cystic structure found in the popliteal fossa.     LEFT:  - Findings consistent with chronic deep vein thrombosis involving the left  common femoral vein.  - No cystic structure found in the popliteal fossa.     Resolved Hospital Problem list   Hypernatremia Hyperkalemia Thrombocytopenia  Assessment & Plan:   Transferred back to ICU 10/16 for Enterobacter pneumonia/HAP Now back to trach collar  Acute hypoxemic respiratory failure likely secondary pneumonia Enterobacter pneumonia-completely treated with cefepime  Tracheostomy state -Tolerating trach collar  FiO2 requirement improving  Continue trach collar Bilateral DVTs -Patient cannot be anticoagulated because of recent intracranial parenchymal bleed -IVC filter placement will be appropriate  Plans for PEG placement  Continue pulmonary hygiene  Continue bronchodilators including Candiss Norse  We will follow weekly  Sherrilyn Rist, MD [facility] Pager: See Shea Evans

## 2022-09-28 NOTE — Consult Note (Signed)
Chief Complaint: Dustin Jones  Referring Physician(s): Sherrilyn Rist  Supervising Physician: Mir, Sharen Heck  Patient Status: [facility] - In-pt  History of Present Illness: Dustin Jones is a 61 y.o. 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  Coiling done by Dr. Kathyrn Sheriff.  He has had prolonged intubation, now with tracheostomy on a collar.  Currently receiving tube feeds via Cortrak.  Need for gastrostomy tube placement.  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  He cannot be anticoagulated due to the cerebral hemorrhage and we are asked to place an IVC filter.  He has known CLL (WBC around 40,000). Chart reviewed and pneumonia has been completely treated and there is no concern for active infection.   History reviewed. No pertinent past medical history.  Past Surgical History:  Procedure Laterality Date   IR ANGIO INTRA EXTRACRAN SEL INTERNAL CAROTID BILAT MOD SED  08/11/2022   IR ANGIOGRAM FOLLOW UP STUDY  08/31/2022   IR ANGIOGRAM FOLLOW UP STUDY  09/03/2022   IR TRANSCATH/EMBOLIZ  08/21/2022   RADIOLOGY WITH ANESTHESIA N/A 08/24/2022   Procedure: IR WITH ANESTHESIA;  Surgeon: Consuella Lose, MD;  Location: [place];  Service: Radiology;  Laterality: N/A;    Allergies: Patient has no allergy information on record.  Medications: Prior to Admission medications   Medication Sig Start Date End Date Taking? Authorizing Provider  cloNIDine (CATAPRES) 0.3 MG tablet Take 0.3 mg by mouth 2 (two) times daily. 07/07/22   [provider]  cyclobenzaprine (FLEXERIL) 10 MG tablet Take 10 mg by mouth at bedtime as needed for muscle spasms. 07/07/22   [provider]  hydrALAZINE (APRESOLINE) 100 MG tablet Take 100 mg by mouth 3 (three) times daily. 07/07/22   [provider]  lisinopril (ZESTRIL) 40 MG tablet Take 40 mg by mouth daily. 07/07/22   [provider]  meloxicam (MOBIC) 15 MG tablet Take 15 mg by mouth daily as needed for pain. 07/07/22   [provider]  simvastatin (ZOCOR) 20 MG tablet Take 20 mg by mouth at bedtime. 07/07/22   [provider]     History reviewed. No pertinent family history.  Social History   Socioeconomic History   Marital status: Single    Spouse name: Not on file   Number of children: Not on file   Years of education: Not on file   Highest education level: Not on file  Occupational History   Not on file  Tobacco Use   Smoking status: Not on file   Smokeless tobacco: Not on file  Substance and Sexual Activity   Alcohol use: Not on file   Drug use: Not on file   Sexual activity: Not on file  Other Topics Concern   Not on file  Social History Narrative   Not on file   Social Determinants of Health   Financial Resource Strain: Not on file  Food Insecurity: Not on file  Transportation Needs: Not on file  Physical Activity: Not on file  Stress: Not on file  Social Connections: Not on file     Review of Systems  Unable to perform ROS: Other  Trach collar  Vital Signs: BP 121/87 (BP Location: Right Arm)   Pulse 77   Temp 98.7 F (37.1 C) (Axillary)   Resp 16   Ht 5\' 7"  (1.702 m)   Wt 255 lb 8.2 oz (115.9  kg)   SpO2 93%   BMI 40.02 kg/m   Physical Exam Constitutional:      Comments: Opens eyes to voice. Nods his head but not much interaction.  Cardiovascular:     Rate and Rhythm: Normal rate.  Pulmonary:     Effort: Pulmonary effort is normal. No respiratory distress.  Abdominal:     Palpations: Abdomen is soft.     Tenderness: There is no abdominal tenderness.     Comments: No scars. No reason Gtube can't be placed.  Skin:    General: Skin is warm and dry.     Imaging: VAS [place] LOWER EXTREMITY VENOUS (DVT)  Result Date: 09/27/2022  Lower Venous DVT Study Patient Name:  Dustin Jones  Date of Exam:   09/25/2022 Medical Rec #: [id number]         Accession #:    [id number] Date of Birth: Nov 24, 1961        Patient Gender: M Patient Age:   82 years Exam Location:  [facility] Procedure:      VAS [place] LOWER EXTREMITY VENOUS (DVT) Referring Phys: [facility] --------------------------------------------------------------------------------  Indications: Edema.  Limitations: Poor ultrasound/tissue interface. Comparison Study: No prior study Performing Technologist: Maudry Mayhew MHA, RDMS, RVT, RDCS  Examination Guidelines: A complete evaluation includes B-mode imaging, spectral Doppler, color Doppler, and power Doppler as needed of all accessible portions of each vessel. Bilateral testing is considered an integral part of a complete examination. Limited examinations for reoccurring indications may be performed as noted. The reflux portion of the exam is performed with the patient in reverse Trendelenburg.  +---------+---------------+---------+-----------+---------------+--------------+ RIGHT    CompressibilityPhasicitySpontaneityProperties     Thrombus Aging +---------+---------------+---------+-----------+---------------+--------------+ CFV      Full           Yes      Yes                                      +---------+---------------+---------+-----------+---------------+--------------+ SFJ      Full                                                             +---------+---------------+---------+-----------+---------------+--------------+ FV Prox  Partial                 Yes                       Chronic        +---------+---------------+---------+-----------+---------------+--------------+ FV Mid   Full                                                             +---------+---------------+---------+-----------+---------------+--------------+ FV Distal                        Yes        patent by color  Doppler                        +---------+---------------+---------+-----------+---------------+--------------+ PFV      Full                                                             +---------+---------------+---------+-----------+---------------+--------------+ POP      Full           Yes      Yes                                      +---------+---------------+---------+-----------+---------------+--------------+ PTV      Full                                                             +---------+---------------+---------+-----------+---------------+--------------+ PERO     Full                                                             +---------+---------------+---------+-----------+---------------+--------------+   +---------+---------------+---------+-----------+----------+--------------+ LEFT     CompressibilityPhasicitySpontaneityPropertiesThrombus Aging +---------+---------------+---------+-----------+----------+--------------+ CFV      Partial        Yes      Yes                  Chronic        +---------+---------------+---------+-----------+----------+--------------+ SFJ      Partial                                                     +---------+---------------+---------+-----------+----------+--------------+ FV Prox  Full                                                        +---------+---------------+---------+-----------+----------+--------------+ FV Mid   Full                                                        +---------+---------------+---------+-----------+----------+--------------+ FV DistalFull                                                        +---------+---------------+---------+-----------+----------+--------------+ PFV      Full                                                        +---------+---------------+---------+-----------+----------+--------------+  POP      Full           Yes      Yes                                  +---------+---------------+---------+-----------+----------+--------------+ PTV      Full                    Yes                                 +---------+---------------+---------+-----------+----------+--------------+   Left Technical Findings: Not visualized segments include peroneal veins.   Summary: RIGHT: - Findings consistent with chronic deep vein thrombosis involving the right femoral vein. - No cystic structure found in the popliteal fossa.  LEFT: - Findings consistent with chronic deep vein thrombosis involving the left common femoral vein. - No cystic structure found in the popliteal fossa.  *See table(s) above for measurements and observations. Electronically signed by Waverly Ferrari MD on 09/27/2022 at 4:24:23 PM.    Final    DG Chest Port 1 View  Result Date: 09/25/2022 CLINICAL DATA:  stroke EXAM: PORTABLE CHEST - 1 VIEW COMPARISON:  09/18/2022 FINDINGS: Tracheostomy device and feeding tube remain in place. Relatively low lung volumes as before with some perihilar bronchovascular crowding. Linear atelectasis, scarring or infiltrate in the lung bases marginally improved. Heart size and mediastinal contours are within normal limits. No effusion. Visualized bones unremarkable. IMPRESSION: Low volumes with marginal improvement in bibasilar atelectasis/infiltrates. Electronically Signed   By: Corlis Leak M.D.   On: 09/25/2022 07:26   CT HEAD WO CONTRAST ( )  Result Date: 09/19/2022 CLINICAL DATA:  Altered mental status.  Hemorrhage follow-up EXAM: CT HEAD WITHOUT CONTRAST TECHNIQUE: Contiguous axial images were obtained from the base of the skull through the vertex without intravenous contrast. RADIATION DOSE REDUCTION: This exam was performed according to the departmental dose-optimization program which includes automated exposure control, adjustment of the mA and/or kV according to patient size and/or use of iterative reconstruction technique. COMPARISON:  09/08/2022 FINDINGS:  Brain: Expected evolution of intraparenchymal hemorrhage centered in the right basal ganglia with decreased size of the hyperdense component and mildly increased surrounding edema. Decreased amount of acute blood in the dependent lateral ventricles. There is leftward midline shift 4 mm. No new site of hemorrhage. Vascular: Coil mass near the right ICA terminus. Skull: Normal. Negative for fracture or focal lesion. Sinuses/Orbits: No acute finding. Other: None. IMPRESSION: 1. Expected evolution of intraparenchymal hemorrhage centered in the right basal ganglia with no acute abnormality. 2. Decreased amount of acute blood in the dependent lateral ventricles. 3. Leftward midline shift 4 mm. Electronically Signed   By: Deatra Robinson M.D.   On: 09/19/2022 01:30   DG Abd Portable 1V  Result Date: 09/18/2022 CLINICAL DATA:  Fever EXAM: PORTABLE ABDOMEN - 1 VIEW COMPARISON:  Abdominal x-ray 09/14/2022 FINDINGS: The tip of the feeding tube is at the level of the gastric antrum. No dilated bowel loops are seen. There are phleboliths in the pelvis. No suspicious calcifications overlie the kidneys. No gross fracture. IMPRESSION: The tip of the feeding tube is at the level of the gastric antrum. Evidence for bowel obstruction. Electronically Signed   By: Darliss Cheney M.D.   On: 09/18/2022 15:13   DG CHEST PORT 1 VIEW  Result Date: 09/18/2022 CLINICAL  DATA:  Fever EXAM: PORTABLE CHEST 1 VIEW COMPARISON:  Chest x-ray 09/13/2022 FINDINGS: The tip of the tracheostomy is at the level of the clavicular heads, unchanged. Enteric tube extends below the diaphragm. The cardiomediastinal silhouette is unchanged, the heart is enlarged. There central pulmonary vascular congestion. There are patchy airspace opacities in both lower lungs which have decreased on the left, but increased on the right. Left pleural effusion has resolved. No pneumothorax or acute fracture. IMPRESSION: 1. Patchy airspace opacities in both lower lungs,  decreased on the left, but increased on the right. Findings may reflect edema or infection. 2. Resolved left pleural effusion. Electronically Signed   By: Darliss Cheney M.D.   On: 09/18/2022 15:09   CT CHEST ABDOMEN PELVIS W CONTRAST  Result Date: 09/16/2022 CLINICAL DATA:  Sepsis. EXAM: CT CHEST, ABDOMEN, AND PELVIS WITH CONTRAST TECHNIQUE: Multidetector CT imaging of the chest, abdomen and pelvis was performed following the standard protocol during bolus administration of intravenous contrast. RADIATION DOSE REDUCTION: This exam was performed according to the departmental dose-optimization program which includes automated exposure control, adjustment of the mA and/or kV according to patient size and/or use of iterative reconstruction technique. CONTRAST:  55mL OMNIPAQUE IOHEXOL 350 MG/ML SOLN COMPARISON:  Recent chest and abdominal radiographs. Chest CT 08/08/2021. No previous abdominopelvic CT. FINDINGS: CT CHEST FINDINGS Cardiovascular: Suboptimal contrast bolus. Allowing for this, no acute vascular findings are identified. There is atherosclerosis of the aorta, great vessels and coronary arteries. The heart size is normal. There is no pericardial effusion. Mediastinum/Nodes: Multiple mildly enlarged axillary and supraclavicular lymph nodes are again demonstrated bilaterally, similar to the previous study. There is a right axillary node measuring 11 mm on image 29/3 and a left supraclavicular node measuring 1.5 cm on image 8/3. No enlarged mediastinal lymph nodes are seen. Hilar assessment is limited by the lack of intravenous contrast, although the hilar contours appear unchanged. A feeding tube and tracheostomy are in place. The thyroid gland, trachea and esophagus demonstrate no significant findings. Lungs/Pleura: No pleural effusion or pneumothorax. Mild dependent opacities in both lungs are most likely due to atelectasis. No consolidation. Musculoskeletal/Chest wall: No chest wall mass or suspicious  osseous findings. Spondylosis noted. CT ABDOMEN AND PELVIS FINDINGS Hepatobiliary: Previously demonstrated diffuse hepatic steatosis has mildly improved in the interval. No focal hepatic abnormalities are seen. No evidence of gallstones, gallbladder wall thickening or biliary dilatation. Pancreas: Unremarkable. No pancreatic ductal dilatation or surrounding inflammatory changes. Spleen: Normal in size without focal abnormality. Adrenals/Urinary Tract: Both adrenal glands appear normal. No evidence of urinary tract calculus, suspicious renal lesion or hydronephrosis. The bladder appears normal for its degree of distention. Stomach/Bowel: No enteric contrast administered. Feeding tube projects into the distal stomach. The stomach appears unremarkable for its degree of distension. No evidence of bowel wall thickening, distention or surrounding inflammatory change. The appendix appears normal. There are diverticular changes within the sigmoid colon. A rectal tube is in place. Vascular/Lymphatic: Scattered prominent retroperitoneal and pelvic lymph nodes, including a right external iliac node measuring 1.7 cm on image 112/3. Diffuse aortic and branch vessel atherosclerosis without evidence of aneurysm or large vessel occlusion. Reproductive: The prostate gland and seminal vesicles appear unremarkable. Other: 7.6 cm umbilical hernia containing only fat. Soft tissue stranding in the subcutaneous fat of the anterior abdominal wall attributed to subcutaneous injections. No ascites, free air or focal extraluminal fluid collection identified. There is some subcutaneous edema laterally in both buttocks. Musculoskeletal: No acute or significant osseous findings. Lower lumbar spondylosis.  IMPRESSION: 1. No acute findings or clear explanation for sepsis identified. 2. Mild dependent opacities in both lungs, most likely due to atelectasis. No consolidation. 3. Mildly prominent lymph nodes in the chest, abdomen and pelvis, similar  to previous chest CT. These are nonspecific, and could be reactive or secondary to a low-grade lymphoproliferative process. 4. Umbilical hernia containing only fat. 5. Sigmoid diverticulosis without evidence of acute inflammation. 6. Improved hepatic steatosis. 7.  Aortic Atherosclerosis (ICD10-I70.0). Electronically Signed   By: Richardean Sale M.D.   On: 09/16/2022 11:40   DG Abd Portable 1V  Result Date: 09/14/2022 CLINICAL DATA:  Encounter for feeding tube placement. EXAM: PORTABLE ABDOMEN - 1 VIEW COMPARISON:  KUB 09/13/2022 FINDINGS: New weighted tip enteric tube curls along the greater curvature of the stomach with the tip overlying the distal stomach. Nonobstructed upper abdominal bowel-gas pattern. The lung bases are clear. IMPRESSION: New weighted tip enteric tube curls along the greater curvature of the stomach with the tip overlying the distal stomach. Electronically Signed   By: Yvonne Kendall M.D.   On: 09/14/2022 09:48   DG Chest Port 1 View  Result Date: 09/13/2022 CLINICAL DATA:  Status post tracheostomy EXAM: PORTABLE CHEST 1 VIEW COMPARISON:  Chest radiograph 09/14/2019 FINDINGS: Interval tracheostomy. Low lung volumes. Overlying external material limits evaluation. Unchanged cardiac and mediastinal contours when accounting for differences in lung volumes. Pneumothorax. New right basilar and left perihilar pulmonary opacities, nonspecific. IMPRESSION: 1. Interval tracheostomy. 2. New right basilar and left perihilar pulmonary opacities, nonspecific, may represent atelectasis, aspiration or infection. Electronically Signed   By: Marin Roberts M.D.   On: 09/13/2022 13:03   DG CHEST PORT 1 VIEW  Result Date: 09/13/2022 CLINICAL DATA:  History of CVA.  Intubated. EXAM: PORTABLE CHEST 1 VIEW COMPARISON:  09/12/2022 FINDINGS: The endotracheal tube is 7 cm above the carina. The NG tube is coursing down the esophagus and into the stomach. The cardiac silhouette, mediastinal and hilar  contours are grossly normal given the AP projection portable technique. Persistent diffuse interstitial process likely asymmetric pulmonary edema. Persistent elevation of the left hemidiaphragm with overlying left basilar atelectasis small pleural effusion. IMPRESSION: 1. Support apparatus in good position without complicating features. 2. Persistent diffuse interstitial process likely asymmetric pulmonary edema. 3. Persistent left basilar atelectasis and small left effusion. Electronically Signed   By: Marijo Sanes M.D.   On: 09/13/2022 09:34   DG Abd Portable 1V  Result Date: 09/13/2022 CLINICAL DATA:  CVA, abdominal distension EXAM: PORTABLE ABDOMEN - 1 VIEW COMPARISON:  09/02/2022 abdominal radiograph FINDINGS: Enteric tube terminates in the proximal stomach. No disproportionately dilated small bowel loops. Mild colonic stool. No evidence of pneumatosis or pneumoperitoneum. No radiopaque nephrolithiasis. Midline inferior approach pelvic catheter terminates over the right symphysis pubis. IMPRESSION: Enteric tube terminates in the proximal stomach. Nonobstructive bowel gas pattern. Electronically Signed   By: Ilona Sorrel M.D.   On: 09/13/2022 09:30   DG CHEST PORT 1 VIEW  Result Date: 09/12/2022 CLINICAL DATA:  Shortness of breath EXAM: PORTABLE CHEST 1 VIEW COMPARISON:  Previous studies including the examination of 09/09/2022 FINDINGS: Transverse diameter of heart is increased. Tip of endotracheal tube is 5.5 cm above the carina. Enteric tube is noted traversing the esophagus. Central pulmonary vessels are prominent. There is blunting of left lateral CP angle suggesting small effusion. Increased markings are seen in both lower lung fields. There is no pneumothorax. IMPRESSION: Cardiomegaly. Central pulmonary vessels are prominent suggesting CHF. Increased markings in both lower lung  fields may suggest interstitial pulmonary edema or atelectasis/pneumonia. Small left pleural effusion. Electronically  Signed   By: Elmer Picker M.D.   On: 09/12/2022 10:23   DG CHEST PORT 1 VIEW  Result Date: 09/09/2022 CLINICAL DATA:  Desaturation EXAM: PORTABLE CHEST 1 VIEW COMPARISON:  09/06/2022, 09/05/2022 FINDINGS: Endotracheal tube tip is about 5.7 cm superior to carina. Esophageal tube tip below the diaphragm but incompletely visualized. Mild cardiomegaly with vascular congestion and suspected minimal edema. Suspect left pleural effusion with persistent airspace disease at left base. Hazy asymmetric density right thorax could be due to layering effusion or mild asymmetric edema. IMPRESSION: 1. Endotracheal tube tip about 5.7 cm superior to carina 2. Cardiomegaly with probable left effusion and persistent dense consolidation left base. Mild asymmetric hazy opacity right thorax could be due to layering effusion or mild asymmetric edema. Electronically Signed   By: Donavan Foil M.D.   On: 09/09/2022 16:36   DG CHEST PORT 1 VIEW  Result Date: 09/09/2022 CLINICAL DATA:  Hypoxia. EXAM: PORTABLE CHEST 1 VIEW COMPARISON:  09/06/2022 FINDINGS: 0535 hours. The cardio pericardial silhouette is enlarged. Left base collapse/consolidation with small pleural effusion again noted. Right lung clear. Endotracheal tube tip is 5.8 cm above the base of the carina. The NG tube passes into the stomach although the distal tip position is not included on the film. Telemetry leads overlie the chest. IMPRESSION: 1. Endotracheal tube tip is 5.8 cm above the base of the carina. 2. Left base collapse/consolidation with small pleural effusion. No substantial change. Electronically Signed   By: Misty Stanley M.D.   On: 09/09/2022 06:32   CT HEAD WO CONTRAST (5MM)  Result Date: 09/08/2022 CLINICAL DATA:  Follow-up examination for stroke. EXAM: CT HEAD WITHOUT CONTRAST TECHNIQUE: Contiguous axial images were obtained from the base of the skull through the vertex without intravenous contrast. RADIATION DOSE REDUCTION: This exam was  performed according to the departmental dose-optimization program which includes automated exposure control, adjustment of the mA and/or kV according to patient size and/or use of iterative reconstruction technique. COMPARISON:  Prior study from 09/02/2022. FINDINGS: Brain: No significant interval change in size and morphology of intraparenchymal hemorrhage centered at the right basal ganglia. Surrounding vasogenic edema has mildly increased from prior. Intraventricular extension with blood in the lateral ventricles, slightly decreased. Overall ventricular size is relatively similar without hydrocephalus. 5 mm of right-to-left shift is similar. No other new intracranial hemorrhage. Previously identified small infarcts better characterized on prior MRI. No other acute large vessel territory infarct. No mass lesion or extra-axial fluid collection. 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 No abnormal hyperdense vessel. Skull: Scalp soft tissues and calvarium demonstrate no new finding. Sinuses/Orbits: Globes orbital soft tissues within normal limits. Moderate mucosal thickening about the sphenoid ethmoidal and maxillary sinuses. Large bilateral mastoid effusions. Patient is suspected to be intubated. Other: None. IMPRESSION: 1. No significant interval change in size and morphology of intraparenchymal hemorrhage centered at the right basal ganglia. Surrounding vasogenic edema has mildly increased from prior. 5 mm of right-to-left shift is similar. 2. Intraventricular extension with blood in the lateral ventricles, slightly decreased. Overall ventricular size is relatively similar without hydrocephalus. 3. Scattered small areas of acute ischemia involving the bilateral cerebral hemispheres, better characterized on recent MRI. 4. No other new acute intracranial abnormality. Electronically Signed   By: Jeannine Boga M.D.   On: 09/08/2022 21:38   IR Transcath/Emboliz  Result Date:  09/06/2022 PROCEDURE: DIAGNOSTIC CEREBRAL ANGIOGRAM COIL EMBOLIZATION OF  RIGHT INTERNAL CAROTID ARTERY ANEURYSM HISTORY: The patient is a 61 year old man initially presenting to the hospital as a Jenny Reichmann Jones having been found unresponsive by bystanders. He was intubated for airway protection. He appeared to have no movement on his left side and therefore code stroke was activated. CT scan demonstrated a large intraparenchymal hematoma on the right, with CT angiogram suggesting possibility of an internal carotid artery aneurysms. Patient therefore presents for further workup with diagnostic cerebral angiogram and possible aneurysm coiling. ACCESS: There was no family or friends available to provide informed consent for the procedure. We therefore proceeded with emergency consent in the patient's best interest. The patient was placed in the supine position on the angiography table and the skin of right groin prepped in the usual sterile fashion. The procedure was performed under general anesthesia. A 5-French sheath was introduced in the right common femoral artery using Seldinger technique. MEDICATIONS: HEPARIN: 0 Units total. CONTRAST:  70mL OMNIPAQUE IOHEXOL 300 MG/ML SOLN, 50mL OMNIPAQUE IOHEXOL 300 MG/ML SOLNcc, Omnipaque 300 FLUOROSCOPY TIME:  FLUOROSCOPY TIME: See IR records TECHNIQUE: CATHETERS AND WIRES 5-French JB-1 catheter 180 cm 0.035" glidewire 6-French NeuronMax guide sheath 6-French Berenstein Select JB-1 catheter 0.058" CatV guidecatheter 150 cm Marksman microcatheter Synchro 2 soft microwire Excelsior XT-17 microcatheter COILS USED Target XL 360 3 mm x 9 cm Target Nano 1.5 mm x 3 cm VESSELS CATHETERIZED Right internal carotid Left internal carotid Right common femoral VESSELS STUDIED Right internal carotid, head Left internal carotid, head Right internal carotid artery, head (during embolization) Right internal carotid artery, head (immediate post-embolization) Right internal carotid artery, head (final  control) Right common femoral PROCEDURAL NARRATIVE A 5-Fr JB-1 glide catheter was advanced over a 0.035 glidewire into the aortic arch. The bilateral internal carotid arteries were then sequentially catheterized and cervical / cerebral angiograms taken. Attempts were made to gain access to the vertebral arteries unsuccessfully. After review of images, the catheter was removed without incident. The 5-Fr sheath was then exchanged over the glidewire for an 8-Fr sheath. Under real-time fluoroscopy, the guide sheath was advanced over the select catheter and glidewire into the descending aorta. The select catheter was then advanced into the proximal right internal carotid artery artery. The guide sheath was then advanced into the proximal cervical right internal carotid artery. The Select catheter was removed and the 058 guide catheter was coaxially introduced over the 27 microcatheter and microwire. The microcatheter was then advanced under roadmap guidance into the right middle cerebral artery. The guide catheter was then tracked over the microcatheter to its final position in the supraclinoid right internal carotid artery. The 27 microcatheter was then removed and the XT 17 catheter introduced over the microwire. The catheter was then navigated into the aneurysm lumen over the microwire. The wire was then removed. The above coils were then sequentially deployed with progressive exclusion of the aneurysm. Cerebral angiography was performed immediately post embolization. The coiling catheter was then removed and the guide catheter withdrawn into the cervical internal carotid artery. Final control angiography was then performed from the guide catheter. The guide catheter and guide sheath were then synchronously removed without incident. FINDINGS: Right internal carotid, head: Injection reveals the presence of a widely patent ICA, M1, and A1 segments and their branches. 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 The aneurysm measures approximately 4.1 x 3.4 mm. The parenchymal and venous phases  are normal. The venous sinuses are widely patent. Left internal carotid, head: Injection reveals the presence of a widely patent ICA, A1, and M1 segments and their branches. No aneurysms, AVMs, or high-flow fistulas are seen. The parenchymal and venous phases are normal. The venous sinuses are widely patent. Right internal carotid artery, head (during embolization): Angiograms taken during coiling reveal the presence of a widely patent ICA that leads to a patent ACA and MCA. Initial angiogram reveals a loop of coil which may be extending across the origin of the left A1. After placement of the coil, coil mass remains within the aneurysm without any coil prolapse noted. No filling defect to suggest thrombus are seen. Right internal carotid artery, head (immediate post-embolization): Injection reveals the presence of a widely patent ICA that leads to a patent ACA and MCA. Coil mass within the aneurysm is stable, without coil prolapse or filling defect to suggest thrombus. There is minimal residual filling at the neck, while the majority of the dome of the aneurysm remains occluded. Right internal carotid artery, head (final control): Injection reveals the presence of a widely patent ICA that leads to a patent ACA and MCA. No thrombus is visualized. Coil mass is seen within the aneurysm and is in stable position. The parenchymal and venous phases are unremarkable. Right femoral: Normal vessel. No significant atherosclerotic disease. Arterial sheath in adequate position. DISPOSITION: Upon completion of the study, the femoral sheath was removed and hemostasis obtained using a 8 French Angio-Seal closure device. Good proximal and distal lower extremity pulses were documented upon achievement of  hemostasis. The procedure was well tolerated and no early complications were observed. The patient was transferred to the intensive care unit for further care. IMPRESSION: 1. 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 The preliminary results of this procedure were shared with the patient's family. 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 He was intubated for airway protection. He appeared to have no movement on his left side and therefore code stroke was activated. CT scan demonstrated a large intraparenchymal hematoma on the right, with CT angiogram suggesting possibility of an internal carotid artery aneurysms. Patient therefore presents for further workup with diagnostic cerebral angiogram and possible aneurysm coiling. ACCESS: There was no family or friends available to provide informed consent for the procedure. We therefore proceeded with emergency consent in the patient's best interest. The patient was placed in the supine position on the angiography table and the skin of right groin prepped in the usual sterile fashion. The procedure was performed under general anesthesia. A 5-French sheath was introduced in the right common femoral artery using Seldinger technique. MEDICATIONS: HEPARIN: 0 Units total. CONTRAST:  53mL OMNIPAQUE IOHEXOL 300 MG/ML SOLN, 72mL OMNIPAQUE IOHEXOL 300 MG/ML SOLNcc, Omnipaque 300 FLUOROSCOPY TIME:  FLUOROSCOPY TIME: See IR records TECHNIQUE: CATHETERS AND WIRES 5-French JB-1 catheter 180 cm 0.035" glidewire 6-French NeuronMax guide sheath  6-French Berenstein Select JB-1 catheter 0.058" CatV guidecatheter 150 cm Marksman microcatheter Synchro 2 soft microwire Excelsior XT-17 microcatheter COILS USED Target XL 360 3 mm x 9 cm Target Nano 1.5 mm x 3 cm VESSELS CATHETERIZED  Right internal carotid Left internal carotid Right common femoral VESSELS STUDIED Right internal carotid, head Left internal carotid, head Right internal carotid artery, head (during embolization) Right internal carotid artery, head (immediate post-embolization) Right internal carotid artery, head (final control) Right common femoral PROCEDURAL NARRATIVE A 5-Fr JB-1 glide catheter was advanced over a 0.035 glidewire into the aortic arch. The bilateral internal carotid arteries were then sequentially catheterized and cervical / cerebral angiograms taken. Attempts were made to gain access to the vertebral arteries unsuccessfully. After review of images, the catheter was removed without incident. The 5-Fr sheath was then exchanged over the glidewire for an 8-Fr sheath. Under real-time fluoroscopy, the guide sheath was advanced over the select catheter and glidewire into the descending aorta. The select catheter was then advanced into the proximal right internal carotid artery artery. The guide sheath was then advanced into the proximal cervical right internal carotid artery. The Select catheter was removed and the 058 guide catheter was coaxially introduced over the 27 microcatheter and microwire. The microcatheter was then advanced under roadmap guidance into the right middle cerebral artery. The guide catheter was then tracked over the microcatheter to its final position in the supraclinoid right internal carotid artery. The 27 microcatheter was then removed and the XT 17 catheter introduced over the microwire. The catheter was then navigated into the aneurysm lumen over the microwire. The wire was then removed. The above coils were then sequentially deployed with progressive exclusion  of the aneurysm. Cerebral angiography was performed immediately post embolization. The coiling catheter was then removed and the guide catheter withdrawn into the cervical internal carotid artery. Final control angiography was then performed from the guide catheter. The guide catheter and guide sheath were then synchronously removed without incident. FINDINGS: Right internal carotid, head: Injection reveals the presence of a widely patent ICA, M1, and A1 segments and their branches. 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 The aneurysm measures approximately 4.1 x 3.4 mm. The parenchymal and venous phases are normal. The venous sinuses are widely patent. Left internal carotid, head: Injection reveals the presence of a widely patent ICA, A1, and M1 segments and their branches. No aneurysms, AVMs, or high-flow fistulas are seen. The parenchymal and venous phases are normal. The venous sinuses are widely patent. Right internal carotid artery, head (during embolization): Angiograms taken during coiling reveal the presence of a widely patent ICA that leads to a patent ACA and MCA. Initial angiogram reveals a loop of coil which may be extending across the origin of the left A1. After placement of the coil, coil mass remains within the aneurysm without any coil prolapse noted. No filling defect to suggest thrombus are seen. Right internal carotid artery, head (immediate post-embolization): Injection reveals the presence of a widely patent ICA that leads to a patent ACA and MCA. Coil mass within the aneurysm is stable, without coil prolapse or filling defect to suggest thrombus. There is minimal residual filling at the neck, while the majority of the dome of the aneurysm remains occluded. Right internal carotid artery, head (final control): Injection reveals the presence of a  widely patent ICA that leads to a patent ACA and MCA. No thrombus is visualized. Coil mass is seen within the aneurysm and is in stable position. The parenchymal and venous phases are unremarkable. Right femoral: Normal vessel. No significant atherosclerotic disease. Arterial sheath in adequate position.  DISPOSITION: Upon completion of the study, the femoral sheath was removed and hemostasis obtained using a 8 French Angio-Seal closure device. Good proximal and distal lower extremity pulses were documented upon achievement of hemostasis. The procedure was well tolerated and no early complications were observed. The patient was transferred to the intensive care unit for further care. IMPRESSION: 1. 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 The preliminary results of this procedure were shared with the patient's family. Electronically Signed   By: Consuella Lose   On: 09/06/2022 14:10   VAS [place] TRANSCRANIAL DOPPLER  Result Date: 09/06/2022  Transcranial Doppler Patient Name:  Dustin Jones  Date of Exam:   09/05/2022 Medical Rec #: [id number]        Accession #:    [id number] Date of Birth: 03/03/1961        Patient Gender: M Patient Age:   80 years Exam Location:  [facility] Procedure:      VAS [place] TRANSCRANIAL DOPPLER Referring Phys: PRAMOD SETHI --------------------------------------------------------------------------------  Indications: ICH. Limitations: Ventilator, poor windows Limitations for diagnostic windows: Unable to insonate right transtemporal window. Unable to insonate left transtemporal window. Comparison Study: No prior study Performing Technologist: Sharion Dove RVS  Examination Guidelines: A complete evaluation includes B-mode imaging, spectral Doppler, color Doppler, and power Doppler as needed of all accessible portions of each vessel. Bilateral testing is considered an integral part of a  complete examination. Limited examinations for reoccurring indications may be performed as noted.  +----------+-------------+----------+-----------+------------------+ RIGHT TCD Right VM (cm)Depth (cm)Pulsatility     Comment       +----------+-------------+----------+-----------+------------------+ MCA                                         unable to insonate +----------+-------------+----------+-----------+------------------+ ACA                                         unable to insonate +----------+-------------+----------+-----------+------------------+ Term ICA                                    unable to insonate +----------+-------------+----------+-----------+------------------+ PCA                                         unable to insonate +----------+-------------+----------+-----------+------------------+ Opthalmic     69.20                 2.08                       +----------+-------------+----------+-----------+------------------+ ICA siphon                                  unable to insonate +----------+-------------+----------+-----------+------------------+ Vertebral    -56.30                                            +----------+-------------+----------+-----------+------------------+  +----------+------------+----------+-----------+------------------+ LEFT TCD  Left VM (cm)Depth (cm)Pulsatility     Comment       +----------+------------+----------+-----------+------------------+ MCA                                        unable to insonate +----------+------------+----------+-----------+------------------+ ACA                                        unable to insonate +----------+------------+----------+-----------+------------------+ Term ICA                                   unable to insonate +----------+------------+----------+-----------+------------------+ PCA                                        unable to  insonate +----------+------------+----------+-----------+------------------+ Opthalmic                                  unable to insonate +----------+------------+----------+-----------+------------------+ ICA siphon                                 unable to insonate +----------+------------+----------+-----------+------------------+ Vertebral                          -49.4                      +----------+------------+----------+-----------+------------------+  +------------+-----+------------------+             VM cm     Comment       +------------+-----+------------------+ Prox Basilar     unable to insonate +------------+-----+------------------+ Dist Basilar     unable to insonate +------------+-----+------------------+ Summary:  Poor windows throughout greatly limit exam. antegrade flow in right opthalmic and bilateral vertebral arteries noted. *See table(s) above for TCD measurements and observations.  Diagnosing physician: Antony Contras MD Electronically signed by Antony Contras MD on 09/06/2022 at 8:13:43 AM.    Final    DG CHEST PORT 1 VIEW  Result Date: 09/06/2022 CLINICAL DATA:  Ventilator dependence. EXAM: PORTABLE CHEST 1 VIEW COMPARISON:  09/05/2022 FINDINGS: 0544 hours. Endotracheal tube tip is 5.8 cm above the base of the carina. The NG tube passes into the stomach although the distal tip position is not included on the film. Low lung volumes with asymmetric elevation right hemidiaphragm. There is pulmonary vascular congestion without overt pulmonary edema. Left base collapse/consolidation is similar to prior with interval decrease in right base atelectasis. Small left pleural effusion suspected. Telemetry leads overlie the chest. IMPRESSION: 1. Interval decrease in right base atelectasis. Otherwise no substantial change in cardiopulmonary exam. 2. Endotracheal tube tip is 5.8 cm above the base of the carina. Electronically Signed   By: Misty Stanley M.D.   On:  09/06/2022 08:03   DG Chest Port 1 View  Result Date: 09/05/2022 CLINICAL DATA:  Respiratory failure. EXAM: PORTABLE CHEST 1 VIEW COMPARISON:  09/04/2022 FINDINGS: 0528 hours. Low volume film. The cardio pericardial silhouette is enlarged. Endotracheal tube tip is approximately 5.0 cm above the base of the carina.  The NG tube passes into the stomach although the distal tip position is not included on the film. There is pulmonary vascular congestion with probable interstitial edema. Left greater than right basilar atelectasis again noted with probable small left effusion. Telemetry leads overlie the chest. IMPRESSION: 1. Low volume film with pulmonary vascular congestion and probable interstitial edema. 2. Left greater than right basilar atelectasis with probable small left effusion. Electronically Signed   By: Misty Stanley M.D.   On: 09/05/2022 06:26   DG Chest Port 1 View  Result Date: 09/04/2022 CLINICAL DATA:  Respiratory failure EXAM: PORTABLE CHEST 1 VIEW COMPARISON:  09/02/2022 FINDINGS: Endotracheal tube 5 cm from carina. NG tube extends the stomach. LEFT basilar atelectasis and small effusions similar prior. No pneumothorax. No pulmonary edema IMPRESSION: 1. No significant change. 2. Stable support apparatus. 3. LEFT basilar atelectasis and effusion. Electronically Signed   By: Suzy Bouchard M.D.   On: 09/04/2022 09:30   DG Abd Portable 1V  Result Date: 09/02/2022 CLINICAL DATA:  Intracranial haemorrhage, code stroke. Encounter for NG tube placement. EXAM: PORTABLE ABDOMEN - 1 VIEW COMPARISON:  08/18/2022. FINDINGS: The bowel gas pattern is normal. An enteric tube terminates in the stomach. No radio-opaque calculi or other significant radiographic abnormality are seen. IMPRESSION: Enteric tube terminates in the stomach. Electronically Signed   By: Brett Fairy M.D.   On: 09/02/2022 20:24   DG CHEST PORT 1 VIEW  Result Date: 09/02/2022 CLINICAL DATA:  Status post intubation. EXAM:  PORTABLE CHEST 1 VIEW COMPARISON:  September 01, 2022 FINDINGS: An endotracheal tube is seen with its distal tip approximately 4.6 cm from the carina. The cardiac silhouette is mildly enlarged and unchanged in size. There is mild prominence of the bilateral suprahilar and bilateral infrahilar pulmonary vasculature. Mild to moderate severity atelectasis and/or infiltrate is seen within the left lung base. This is decreased in severity when compared to the prior study with improved aeration seen throughout the remainder of the left lung. There is no evidence of a pleural effusion or pneumothorax. The visualized skeletal structures are unremarkable. IMPRESSION: 1. Endotracheal tube positioning, as described above. 2. Mild to moderate severity left basilar atelectasis and/or infiltrate with improved aeration of the left lung compared to the prior study. Electronically Signed   By: Virgina Norfolk M.D.   On: 09/02/2022 19:34   MR BRAIN W WO CONTRAST  Result Date: 09/02/2022 CLINICAL DATA:  Hemorrhagic stroke EXAM: MRI HEAD WITHOUT AND WITH CONTRAST TECHNIQUE: Multiplanar, multiecho pulse sequences of the brain and surrounding structures were obtained without and with intravenous contrast. CONTRAST:  10 mL Vueway COMPARISON:  No prior MRI, correlation is made with CT head 08/25/2022 FINDINGS: Brain: Large acute intraparenchymal hemorrhage centered in the right lentiform nucleus, measuring approximately 6.6 x 2.2 x 3.3 cm (AP x TR x CC) (estimated volume 24 mL), overall similar to the prior CT. Small amount of surrounding T2 hyperintense signal, likely edema. Redemonstrated extension into the right-greater-than-left lateral ventricle, third ventricle, and fourth ventricle. Mild mass effect and 5 mm of right-to-left midline shift. Small area of restricted diffusion in the right occipital lobe (series 5, image 81), with ADC correlate (series 6, image 27), likely acute infarct. Additional smaller foci of acute infarct  are noted in the right parietal lobe (series 5, image 91 and 97), left anterior frontal lobe (series 5, image 83), right anterior and inferior frontal lobe (series 5, image 94 and 82), and left occipital lobe (series 5, image 83). No hydrocephalus. Vascular:  Normal arterial flow voids. Susceptibility is seen in the location of the previously noted right ICA terminus aneurysm, possibly related to coiling. Otherwise normal arterial and venous enhancement. Skull and upper cervical spine: Normal marrow signal. Sinuses/Orbits: Mild mucosal thickening in the maxillary sinuses. The orbits are unremarkable. Other: Trace fluid in the mastoid air cells. IMPRESSION: 1. Large acute intraparenchymal hemorrhage centered in the right lentiform nucleus, with extension into the right-greater-than-left lateral ventricle, third ventricle, and fourth ventricle, overall similar to the prior CT. Mild mass effect and 5 mm of right-to-left midline shift. No hydrocephalus. 2. Small areas of acute infarct in the bilateral occipital lobes, bilateral frontal lobes, and right parietal lobe. These results will be called to the ordering clinician or representative by the Radiologist Assistant, and communication documented in the PACS or [employer]. Electronically Signed   By: Merilyn Baba M.D.   On: 09/02/2022 04:02   DG Abd 1 View  Result Date: 08/23/2022 CLINICAL DATA:  Screening for metal prior to MRI. EXAM: ABDOMEN - 1 VIEW COMPARISON:  None Available. FINDINGS: Rectal temperature probe in place. NG tube in the stomach. No additional radiopaque foreign body. Nonobstructive bowel gas pattern. No free air or organomegaly. IMPRESSION: NG tube in the stomach.  Rectal temperature probe in place. No acute findings. Electronically Signed   By: Rolm Baptise M.D.   On: 08/19/2022 20:15   CT HEAD WO CONTRAST  Result Date: 08/15/2022 CLINICAL DATA:  Stroke, hemorrhagic Neuro deficit, acute, stroke suspected EXAM: CT HEAD WITHOUT CONTRAST  TECHNIQUE: Contiguous axial images were obtained from the base of the skull through the vertex without intravenous contrast. RADIATION DOSE REDUCTION: This exam was performed according to the departmental dose-optimization program which includes automated exposure control, adjustment of the mA and/or kV according to patient size and/or use of iterative reconstruction technique. COMPARISON:  CT head from the same day. FINDINGS: Brain: Unchanged size of the acute intraparenchymal hemorrhage centered in the right lentiform nucleus with mildly increased intraventricular extension. Similar ventricular size and mass effect. No evidence of acute large vascular territory infarct or mass lesion. Vascular: No hyperdense vessel identified. Skull: No acute fracture. Sinuses/Orbits: Clear sinuses.  No acute orbital findings. Other: No mastoid effusions. IMPRESSION: Unchanged size of the acute intraparenchymal hemorrhage centered in the right lentiform nucleus with mildly increased intraventricular extension. Similar ventricular size and mass effect. Electronically Signed   By: Margaretha Sheffield M.D.   On: 08/25/2022 16:27   ECHOCARDIOGRAM COMPLETE  Result Date: 08/24/2022    ECHOCARDIOGRAM REPORT   Patient Name:   Dustin Jones Date of Exam: 08/30/2022 Medical Rec #:  [id number]       Height:       67.0 in Accession #:    [id number]      Weight:       220.5 lb Date of Birth:  12/05/1875      BSA:          2.108 m Patient Age:    89 years       BP:           118/72 mmHg Patient Gender: M               HR:           69 bpm. Exam Location:  Inpatient Procedure: 2D Echo, Cardiac Doppler and Color Doppler Indications:    Stroke  History:        Patient has no prior history of Echocardiogram examinations.  Sonographer:  Memory Argue Referring Phys: [id number] [place]  1. Left ventricular ejection fraction, by estimation, is 60 to 65%. The left ventricle has normal function. The left ventricle has no  regional wall motion abnormalities. There is mild left ventricular hypertrophy. Left ventricular diastolic parameters are consistent with Grade I diastolic dysfunction (impaired relaxation).  2. Right ventricular systolic function is normal. The right ventricular size is normal.  3. The mitral valve is grossly normal. No evidence of mitral valve regurgitation.  4. The aortic valve is grossly normal. Aortic valve regurgitation is not visualized.  5. IVC is not well visualized. Conclusion(s)/Recommendation(s): Normal biventricular function without evidence of hemodynamically significant valvular heart disease. FINDINGS  Left Ventricle: Left ventricular ejection fraction, by estimation, is 60 to 65%. The left ventricle has normal function. The left ventricle has no regional wall motion abnormalities. The left ventricular internal cavity size was normal in size. There is  mild left ventricular hypertrophy. Left ventricular diastolic parameters are consistent with Grade I diastolic dysfunction (impaired relaxation). Right Ventricle: The right ventricular size is normal. Right ventricular systolic function is normal. Left Atrium: Left atrial size was normal in size. Right Atrium: Right atrial size was normal in size. Pericardium: There is no evidence of pericardial effusion. Mitral Valve: The mitral valve is grossly normal. No evidence of mitral valve regurgitation. Tricuspid Valve: Tricuspid valve regurgitation is not demonstrated. Aortic Valve: The aortic valve is grossly normal. Aortic valve regurgitation is not visualized. Aortic valve mean gradient measures 6.0 mmHg. Aortic valve peak gradient measures 11.6 mmHg. Aortic valve area, by VTI measures 2.40 cm. Pulmonic Valve: Pulmonic valve regurgitation is not visualized. Aorta: The aortic root and ascending aorta are structurally normal, with no evidence of dilitation. Venous: IVC is not well visualized. IAS/Shunts: The interatrial septum was not well visualized.   LEFT VENTRICLE PLAX 2D LVIDd:         4.70 cm   Diastology LVIDs:         3.00 cm   LV e' medial:    5.55 cm/s LV PW:         1.30 cm   LV E/e' medial:  12.4 LV IVS:        1.20 cm   LV e' lateral:   4.79 cm/s LVOT diam:     1.90 cm   LV E/e' lateral: 14.4 LV SV:         69 LV SV Index:   33 LVOT Area:     2.84 cm  RIGHT VENTRICLE TAPSE (M-mode): 1.9 cm LEFT ATRIUM             Index        RIGHT ATRIUM           Index LA diam:        3.10 cm 1.47 cm/m   RA Area:     13.00 cm LA Vol (A2C):   57.4 ml 27.23 ml/m  RA Volume:   29.90 ml  14.19 ml/m LA Vol (A4C):   82.5 ml 39.14 ml/m LA Biplane Vol: 81.4 ml 38.62 ml/m  AORTIC VALVE AV Area (Vmax):    2.54 cm AV Area (Vmean):   2.36 cm AV Area (VTI):     2.40 cm AV Vmax:           170.00 cm/s AV Vmean:          111.000 cm/s AV VTI:            0.287 m AV Peak  Grad:      11.6 mmHg AV Mean Grad:      6.0 mmHg LVOT Vmax:         152.00 cm/s LVOT Vmean:        92.500 cm/s LVOT VTI:          0.243 m LVOT/AV VTI ratio: 0.85  AORTA Ao Root diam: 3.50 cm MITRAL VALVE MV Area (PHT): 2.87 cm    SHUNTS MV Decel Time: 264 msec    Systemic VTI:  0.24 m MV E velocity: 68.80 cm/s  Systemic Diam: 1.90 cm MV A velocity: 87.40 cm/s MV E/A ratio:  0.79 Landscape architect signed by Phineas Inches Signature Date/Time: 08/23/2022/12:07:27 PM    Final    IR Angiogram Follow Up Study  RESULT     PROCEDURE: DIAGNOSTIC CEREBRAL ANGIOGRAM 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 He was intubated for airway protection. He appeared to have no movement on his left side and therefore code stroke was activated. CT scan demonstrated a large intraparenchymal hematoma on the right, with CT angiogram suggesting possibility of an internal carotid artery aneurysms. Patient therefore presents for further workup with diagnostic cerebral  angiogram and possible aneurysm coiling. ACCESS: There was no family or friends available to provide informed consent for the procedure. We therefore proceeded with emergency consent in the patient's best interest. The patient was placed in the supine position on the angiography table and the skin of right groin prepped in the usual sterile fashion. The procedure was performed under general anesthesia. A 5-French sheath was introduced in the right common femoral artery using Seldinger technique. MEDICATIONS: HEPARIN: 0 Units total. CONTRAST:  71mL OMNIPAQUE IOHEXOL 300 MG/ML SOLN, 55mL OMNIPAQUE IOHEXOL 300 MG/ML SOLNcc, Omnipaque 300 FLUOROSCOPY TIME:  FLUOROSCOPY TIME: See IR records TECHNIQUE: CATHETERS AND WIRES 5-French JB-1 catheter 180 cm 0.035" glidewire 6-French NeuronMax guide sheath 6-French Berenstein Select JB-1 catheter 0.058" CatV guidecatheter 150 cm Marksman microcatheter Synchro 2 soft microwire Excelsior XT-17 microcatheter COILS USED Target XL 360 3 mm x 9 cm Target Nano 1.5 mm x 3 cm VESSELS CATHETERIZED Right internal carotid Left internal carotid Right common femoral VESSELS STUDIED Right internal carotid, head Left internal carotid, head Right internal carotid artery, head (during embolization) Right internal carotid artery, head (immediate post-embolization) Right internal carotid artery, head (final control) Right common femoral PROCEDURAL NARRATIVE A 5-Fr JB-1 glide catheter was advanced over a 0.035 glidewire into the aortic arch. The bilateral internal carotid arteries were then sequentially catheterized and cervical / cerebral angiograms taken. Attempts were made to gain access to the vertebral arteries unsuccessfully. After review of images, the catheter was removed without incident. The 5-Fr sheath was then exchanged over the glidewire for an 8-Fr sheath. Under real-time fluoroscopy, the guide sheath was advanced over the select catheter and glidewire into the descending aorta. The  select catheter was then advanced into the proximal right internal carotid artery artery. The guide sheath was then advanced into the proximal cervical right internal carotid artery. The Select catheter was removed and the 058 guide catheter was coaxially introduced over the 27 microcatheter and microwire. The microcatheter was then advanced under roadmap guidance into the right middle cerebral artery. The guide catheter was then tracked over the microcatheter to its final position in the supraclinoid right internal carotid artery. The 27 microcatheter was then removed and the  XT 17 catheter introduced over the microwire. The catheter was then navigated into the aneurysm lumen over the microwire. The wire was then removed. The above coils were then sequentially deployed with progressive exclusion of the aneurysm. Cerebral angiography was performed immediately post embolization. The coiling catheter was then removed and the guide catheter withdrawn into the cervical internal carotid artery. Final control angiography was then performed from the guide catheter. The guide catheter and guide sheath were then synchronously removed without incident. FINDINGS: Right internal carotid, head: Injection reveals the presence of a widely patent ICA, M1, and A1 segments and their branches. 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 The aneurysm measures approximately 4.1 x 3.4 mm. The parenchymal and venous phases are normal. The venous sinuses are widely patent. Left internal carotid, head: Injection reveals the presence of a widely patent ICA, A1, and M1 segments and their branches. No aneurysms, AVMs, or high-flow fistulas are seen. The parenchymal and venous phases are normal. The venous sinuses are widely patent. Right internal carotid artery, head (during embolization):  Angiograms taken during coiling reveal the presence of a widely patent ICA that leads to a patent ACA and MCA. Initial angiogram reveals a loop of coil which may be extending across the origin of the left A1. After placement of the coil, coil mass remains within the aneurysm without any coil prolapse noted. No filling defect to suggest thrombus are seen. Right internal carotid artery, head (immediate post-embolization): Injection reveals the presence of a widely patent ICA that leads to a patent ACA and MCA. Coil mass within the aneurysm is stable, without coil prolapse or filling defect to suggest thrombus. There is minimal residual filling at the neck, while the majority of the dome of the aneurysm remains occluded. Right internal carotid artery, head (final control): Injection reveals the presence of a widely patent ICA that leads to a patent ACA and MCA. No thrombus is visualized. Coil mass is seen within the aneurysm and is in stable position. The parenchymal and venous phases are unremarkable. Right femoral: Normal vessel. No significant atherosclerotic disease. Arterial sheath in adequate position. DISPOSITION: Upon completion of the study, the femoral sheath was removed and hemostasis obtained using a 8 French Angio-Seal closure device. Good proximal and distal lower extremity pulses were documented upon achievement of hemostasis. The procedure was well tolerated and no early complications were observed. The patient was transferred to the intensive care unit for further care. IMPRESSION: 1. 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 The preliminary results of this procedure were shared with the patient's family. Electronically Signed   By: Consuella Lose   On: 09/06/2022 14:10   IR Angiogram Follow Up Study  RESULT     PROCEDURE: DIAGNOSTIC CEREBRAL ANGIOGRAM COIL EMBOLIZATION OF RIGHT INTERNAL CAROTID ARTERY  ANEURYSM HISTORY: The patient is a 61 year old man initially presenting to the hospital as a Jenny Reichmann Jones having been found unresponsive by bystanders. He was intubated for airway protection. He appeared to have no movement on his left side and therefore code stroke was activated. CT scan demonstrated a large intraparenchymal hematoma on the right, with CT angiogram suggesting possibility of an internal carotid artery aneurysms. Patient therefore presents for further workup with diagnostic cerebral angiogram and possible aneurysm coiling. ACCESS: There was no family or friends available to provide informed  consent for the procedure. We therefore proceeded with emergency consent in the patient's best interest. The patient was placed in the supine position on the angiography table and the skin of right groin prepped in the usual sterile fashion. The procedure was performed under general anesthesia. A 5-French sheath was introduced in the right common femoral artery using Seldinger technique. MEDICATIONS: HEPARIN: 0 Units total. CONTRAST:  56mL OMNIPAQUE IOHEXOL 300 MG/ML SOLN, 58mL OMNIPAQUE IOHEXOL 300 MG/ML SOLNcc, Omnipaque 300 FLUOROSCOPY TIME:  FLUOROSCOPY TIME: See IR records TECHNIQUE: CATHETERS AND WIRES 5-French JB-1 catheter 180 cm 0.035" glidewire 6-French NeuronMax guide sheath 6-French Berenstein Select JB-1 catheter 0.058" CatV guidecatheter 150 cm Marksman microcatheter Synchro 2 soft microwire Excelsior XT-17 microcatheter COILS USED Target XL 360 3 mm x 9 cm Target Nano 1.5 mm x 3 cm VESSELS CATHETERIZED Right internal carotid Left internal carotid Right common femoral VESSELS STUDIED Right internal carotid, head Left internal carotid, head Right internal carotid artery, head (during embolization) Right internal carotid artery, head (immediate post-embolization) Right internal carotid artery, head (final control) Right common femoral PROCEDURAL NARRATIVE A 5-Fr JB-1 glide catheter was advanced over a  0.035 glidewire into the aortic arch. The bilateral internal carotid arteries were then sequentially catheterized and cervical / cerebral angiograms taken. Attempts were made to gain access to the vertebral arteries unsuccessfully. After review of images, the catheter was removed without incident. The 5-Fr sheath was then exchanged over the glidewire for an 8-Fr sheath. Under real-time fluoroscopy, the guide sheath was advanced over the select catheter and glidewire into the descending aorta. The select catheter was then advanced into the proximal right internal carotid artery artery. The guide sheath was then advanced into the proximal cervical right internal carotid artery. The Select catheter was removed and the 058 guide catheter was coaxially introduced over the 27 microcatheter and microwire. The microcatheter was then advanced under roadmap guidance into the right middle cerebral artery. The guide catheter was then tracked over the microcatheter to its final position in the supraclinoid right internal carotid artery. The 27 microcatheter was then removed and the XT 17 catheter introduced over the microwire. The catheter was then navigated into the aneurysm lumen over the microwire. The wire was then removed. The above coils were then sequentially deployed with progressive exclusion of the aneurysm. Cerebral angiography was performed immediately post embolization. The coiling catheter was then removed and the guide catheter withdrawn into the cervical internal carotid artery. Final control angiography was then performed from the guide catheter. The guide catheter and guide sheath were then synchronously removed without incident. FINDINGS: Right internal carotid, head: Injection reveals the presence of a widely patent ICA, M1, and A1 segments and their branches. 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 The aneurysm measures approximately 4.1 x 3.4 mm. The parenchymal and venous phases are normal. The venous sinuses are widely patent. Left internal carotid, head: Injection reveals the presence of a widely patent ICA, A1, and M1 segments and their branches. No aneurysms, AVMs, or high-flow fistulas are seen. The parenchymal and venous phases are normal. The venous sinuses are widely patent. Right internal carotid artery, head (during embolization): Angiograms taken during coiling reveal the presence of a widely patent ICA that leads to a patent ACA and MCA. Initial angiogram reveals a loop of coil which may be extending across the origin of the left A1. After  placement of the coil, coil mass remains within the aneurysm without any coil prolapse noted. No filling defect to suggest thrombus are seen. Right internal carotid artery, head (immediate post-embolization): Injection reveals the presence of a widely patent ICA that leads to a patent ACA and MCA. Coil mass within the aneurysm is stable, without coil prolapse or filling defect to suggest thrombus. There is minimal residual filling at the neck, while the majority of the dome of the aneurysm remains occluded. Right internal carotid artery, head (final control): Injection reveals the presence of a widely patent ICA that leads to a patent ACA and MCA. No thrombus is visualized. Coil mass is seen within the aneurysm and is in stable position. The parenchymal and venous phases are unremarkable. Right femoral: Normal vessel. No significant atherosclerotic disease. Arterial sheath in adequate position. DISPOSITION: Upon completion of the study, the femoral sheath was removed and hemostasis obtained using a 8 French Angio-Seal closure device. Good proximal and distal lower extremity pulses were documented upon achievement of hemostasis. The procedure was well tolerated and no early complications were observed. The patient  was transferred to the intensive care unit for further care. IMPRESSION: 1. 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 The preliminary results of this procedure were shared with the patient's family. Electronically Signed   By: Consuella Lose   On: 09/06/2022 14:10   DG Chest Port 1 View  Result Date: 08/29/2022 CLINICAL DATA:  Male of unknown age code stroke presentation. Right lentiform hemorrhage with intraventricular extension on head CT. Intubated. Blunt trauma. EXAM: PORTABLE CHEST 1 VIEW COMPARISON:  CTA head and neck today reported separately. FINDINGS: Portable AP semi upright view at 0948 hours. Satisfactory endotracheal tube tip between the clavicles and carina. Enteric tube courses to the abdomen and appears to loop in the stomach, left upper quadrant. Low lung volumes. Mediastinal contours are within normal limits. Patchy and confluent left lung, perihilar opacity. Dependent right upper lung opacity better demonstrated by CTA today. No pneumothorax or definite effusion. Paucity of bowel gas in the upper abdomen. No acute osseous abnormality identified. IMPRESSION: 1. Satisfactory ET tube and enteric tube. 2. Patchy and confluent left lung opacity, with dependent right upper lung opacity better demonstrated by CTA today. Consider aspiration and/or pneumonia. No pneumothorax or definite effusion. Electronically Signed   By: Genevie Ann M.D.   On: 08/27/2022 10:03   CT C-SPINE NO CHARGE  Result Date: 08/12/2022 CLINICAL DATA:  Male of unknown age code stroke presentation. Right lentiform hemorrhage with intraventricular extension on head CT. Intubated. Blunt trauma. EXAM: CT CERVICAL SPINE WITH CONTRAST TECHNIQUE: Multiplanar CT images of the cervical spine were reconstructed from contemporary CTA of the Neck. RADIATION DOSE REDUCTION: This exam was performed according to the departmental dose-optimization  program which includes automated exposure control, adjustment of the mA and/or kV according to patient size and/or use of iterative reconstruction technique. CONTRAST:  No additional COMPARISON:  CT Head, CT face, and CTA Head and Neck today. FINDINGS: Alignment: Reversal of cervical lordosis. Cervicothoracic junction alignment is within normal limits. Bilateral posterior element alignment is within normal limits. Skull base and vertebrae: Visualized skull base is intact. No atlanto-occipital dissociation. C1 and C2 appear intact and aligned. No acute osseous abnormality identified. Soft tissues and spinal canal: No prevertebral fluid or swelling. No visible canal hematoma. Note bilateral cervical lymphadenopathy, but other face and neck soft tissues are reported separately Disc levels: Congenital incomplete  segmentation of C3-C4. Chronic severe disc and endplate degeneration [id number] through T1-T2. Associated bulky anterior endplate osteophytes, with subsequent interbody ankylosis of C7-T1. Possible developing ankylosis at C6-C7. Mild associated lower cervical spinal stenosis suspected. Upper chest: Reported separately. CT Head and CTA Head and Neck are reported separately. IMPRESSION: 1. No acute traumatic injury identified in the cervical spine. 2. CT Head and CTA Head and Neck are reported separately. 3. Nonspecific cervical Lymphadenopathy, see Face CT reported separately. 4. 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 Mild lower cervical spinal stenosis suspected. Electronically Signed   By: Genevie Ann M.D.   On: 08/28/2022 10:01   CT MAXILLOFACIAL W CONTRAST  Result Date: 08/08/2022 CLINICAL DATA:  Male of unknown age code stroke presentation. Right lentiform hemorrhage with intraventricular extension on head CT. Intubated. Blunt trauma. EXAM: CT MAXILLOFACIAL WITH  CONTRAST TECHNIQUE: Multidetector CT imaging of the maxillofacial structures was performed with intravenous contrast. Multiplanar CT image reconstructions were also generated. RADIATION DOSE REDUCTION: This exam was performed according to the departmental dose-optimization program which includes automated exposure control, adjustment of the mA and/or kV according to patient size and/or use of iterative reconstruction technique. CONTRAST:  10mL OMNIPAQUE IOHEXOL 350 MG/ML SOLN COMPARISON:  Head CT, cervical spine CT, CTA head and neck today reported separately. FINDINGS: Osseous: Widespread carious dentition. Mandible intact. TMJ alignment within normal limits. No acute maxilla, zygoma, pterygoid, or nasal bone fracture. Central skull base appears intact. Cervical spine is detailed separately. Orbits: Intact orbital walls. Orbits soft tissues appear symmetric and negative. Sinuses: Paranasal sinuses and mastoids are well aerated. Tympanic cavities are clear. Soft tissues: Intubated. Fluid in the pharynx. Nonspecific adenoid hypertrophy. Retropharyngeal course of the carotid arteries. Parapharyngeal, sublingual, submandibular, masticator, and parotid spaces appear normal. Nonspecific bilateral cervical lymphadenopathy, with individual nodes up to 18 mm short axis (frequent bilateral cervical nodes of 14-15 mm short axis). No cystic or necrotic nodes. Major vascular structures are reported with the CTA separately. Limited intracranial: Reported separately. IMPRESSION: 1. No acute traumatic injury identified in the Face. 2. Nonspecific bilateral cervical lymphadenopathy. Consider an acute Lymphoproliferative disorder. HIV can have a similar appearance. Metastatic disease felt less likely. 3. Poor dentition. 4. CT Head and CTA Head and Neck are reported separately. Electronically Signed   By: Genevie Ann M.D.   On: 08/29/2022 09:57   CT ANGIO HEAD NECK W WO CM (CODE STROKE)  Result Date: 08/09/2022 CLINICAL DATA:  Neuro  deficit, acute, stroke suspected EXAM: CT ANGIOGRAPHY HEAD AND NECK TECHNIQUE: Multidetector CT imaging of the head and neck was performed using the standard protocol during bolus administration of intravenous contrast. Multiplanar CT image reconstructions and MIPs were obtained to evaluate the vascular anatomy. Carotid stenosis measurements (when applicable) are obtained utilizing NASCET criteria, using the distal internal carotid diameter as the denominator. RADIATION DOSE REDUCTION: This exam was performed according to the departmental dose-optimization program which includes automated exposure control, adjustment of the mA and/or kV according to patient size and/or use of iterative reconstruction technique. COMPARISON:  None Available. FINDINGS: CTA NECK FINDINGS Aortic arch: Great vessel origins are patent. Right carotid system: Limited assessment due to poor contrast bolus timing with out evidence of greater than 50% stenosis. Left carotid system: Limited assessment due to poor contrast bolus timing with out evidence of greater than 50% stenosis. Vertebral arteries: Very limited evaluation due to poor contrast bolus timing. Vertebral arteries appear patent.  Skeleton: Multilevel degenerative change. Other neck: No acute findings. Upper chest: Opacities in bilateral upper lobes. Review of the MIP images confirms the above findings CTA HEAD FINDINGS Anterior circulation: Lobulated 5 mm outpouching arising from the supraclinoid right ICA (series 10, image 103 and series 9, image 98 Bilateral intracranial ICAs, MCAs and ACAs are patent without visible high-grade stenosis. Parotid mild-to-moderate bilateral intracranial ICA stenosis. Posterior circulation: Bilateral intradural vertebral arteries, basilar artery and posterior cerebral arteries are patent. Limited assessment for stenosis due to poor contrast bolus timing. Venous sinuses: As permitted by contrast timing, patent. Review of the MIP images confirms the  above findings IMPRESSION: 1. Limited study due to poor contrast bolus timing. There appears to be a lobulated 5 mm outpouching arising from the supraclinoid right ICA, adjacent to the acute intraparenchymal hemorrhage and suspicious for ruptured aneurysm. Catheter arteriogram could better assess. 2. Dependent opacities in bilateral upper lobes, probably aspiration. Findings discussed with Dr. Lorrin Goodell via telephone 9:51 a.m. Electronically Signed   By: Margaretha Sheffield M.D.   On: 08/30/2022 09:55   CT HEAD CODE STROKE WO CONTRAST  Result Date: 08/26/2022 CLINICAL DATA:  Code stroke.  Neuro deficit, acute, stroke suspected EXAM: CT HEAD WITHOUT CONTRAST TECHNIQUE: Contiguous axial images were obtained from the base of the skull through the vertex without intravenous contrast. RADIATION DOSE REDUCTION: This exam was performed according to the departmental dose-optimization program which includes automated exposure control, adjustment of the mA and/or kV according to patient size and/or use of iterative reconstruction technique. COMPARISON:  None Available. FINDINGS: Brain: Large acute hemorrhage in the right lentiform nucleus with intraventricular extension to the lateral ventricles, third ventricle, and fourth ventricle. Acute hemorrhage measures up to approximately 7.0 x 2.3 x 3.6 cm (estimated volume of 29 mL). There may be a small amount of subdural hemorrhage along the falx. Slight rounding of the temporal horns. Approximately 7 mm of leftward midline shift at the foramen of [place]ro. Mild effacement of the suprasellar cistern and sulcal effacement. No evidence of acute large vascular territory infarct or mass lesion. Vascular: No hyperdense vessel identified. Skull: No acute fracture. Sinuses/Orbits: Clear visualized sinuses. No acute orbital findings. IMPRESSION: 1. Large acute hemorrhage in the right lentiform nucleus with intraventricular extension. Approximately 7 mm of leftward midline shift. 2. Slight  rounding of the temporal horns, which could represent early hydrocephalus. Recommend attention on follow-up. Findings discussed with Dr. Oneal Grout via telephone at 9:30 a.m. Electronically Signed   By: Margaretha Sheffield M.D.   On: 08/26/2022 09:33    Labs:  CBC: Recent Labs    09/22/22 0132 09/23/22 0504 09/25/22 0319 09/27/22 0554  WBC 39.7* 41.1* 42.5* 41.8*  HGB 11.7* 12.4* 12.6* 11.9*  HCT 36.6* 39.4 40.3 36.8*  PLT 181 175 176 177    COAGS: Recent Labs    09/02/2022 0900  INR 1.1  APTT 21*    BMP: Recent Labs    09/23/22 0504 09/25/22 0319 09/27/22 0554 09/28/22 0538  NA 139 141 139 139  K 4.2 4.5 4.4 3.9  CL 105 107 103 99  CO2 25 26 28 29   GLUCOSE 152* 114* 146* 129*  BUN 40* 54* 56* 51*  CALCIUM 9.3 9.6 9.7 10.0  CREATININE 0.93 1.18 1.31* 1.05  GFRNONAA >60 >60 >60 >60    LIVER FUNCTION TESTS: Recent Labs    08/30/2022 0900 09/04/22 0150 09/06/22 0509 09/11/22 0617 09/12/22 0519 09/19/22 0451 09/28/22 0538  BILITOT 0.9 0.6  --   --   --  0.5  --   AST 64* 58*  --   --   --  42*  --   ALT 49* 65*  --   --   --  47*  --   ALKPHOS 74 58  --   --   --  56  --   PROT 7.6 6.4*  --   --   --  6.7  --   ALBUMIN 3.9 3.1*   < > 2.8* 2.8* 2.7* 3.0*   < > = values in this interval not displayed.    TUMOR MARKERS: No results for input(s): "AFPTM", "CEA", "CA199", "CHROMGRNA" in the last 8760 hours.  Assessment and Plan:  ICH with intervention by Dr. Kathyrn Sheriff.  Prolonged intubation/vent now with tracheostomy/collar. Need for enteral Jones. Asked to place gastrostomy.   Also with DVT, unable to anticoagulate due to Felt.  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 Stable  Will proceed with gastrostomy tube and filter placement tomorrow.  Risks and benefits image guided gastrostomy tube placement was discussed with the patient's family including, but not limited to the need for a barium enema during the procedure, bleeding, infection, peritonitis and/or damage to  adjacent structures.  Risks and benefits discussed with the patient's family including, but not limited to bleeding, infection, contrast induced renal failure, filter fracture or migration which can lead to emergency surgery or even death, strut penetration with damage or irritation to adjacent structures and caval thrombosis.  All questions were answered, patient is agreeable to proceed. Consent signed and in chart.  Thank you for allowing our service to participate in [place] 's care.  Electronically Signed: Murrell Redden, PA-C   09/28/2022, 3:12 PM      I spent a total of 40 Minutes  in face to face in clinical consultation, greater than 50% of which was counseling/coordinating care for Gtube and IVC filter.

## 2022-09-28 NOTE — Progress Notes (Signed)
Daily Progress Note   Patient Name: Dustin Jones       Date: 09/28/2022 DOB: 16-Jul-1961  Age: 61 y.o. MRN#: [id number] Attending Physician: Edwin Dada, * Primary Care Physician: Marliss Coots, NP Admit Date: 09-03-2022  Reason for Consultation/Follow-up: Establishing goals of care  Patient Profile/HPI:   61 y.o. 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 Recovery complicated by respiratory failure, pneumonia. Has trach, currently on trach collar 60% 10L. Poor mentation, following simple commands intermittently. Cortrak in place. SLP re-eval pending. Palliative medicine consulted for above.      Subjective: Dustin Jones was awake and alert today. Yawning at times. He nodded "no" when asked if he was in pain. He nodded "yes" when asked if he was in the hospital.  He was able to follow my instructions when asked to touch my hand, hold one finger up, hold two fingers up.  I called his daughter Dustin Jones. She has not yet been able to coordinate a time for a family meeting.  She expressed feeling some confusion by receiving several calls from different people and not quite sure what each person's role was and what they were calling about.  I reviewed role of Palliative medicine- Palliative medicine is specialized medical care for people living with serious illness. It focuses on providing relief from the symptoms and stress of a serious illness. The goal is to improve quality of life for both the patient and the family. Additionally, we were consulted to discuss goals of care and assist with medical decision making.   Review of Systems  Unable to perform ROS: Patient nonverbal     Physical Exam Vitals  and nursing note reviewed.  Cardiovascular:     Rate and Rhythm: Normal rate.  Pulmonary:     Effort: Pulmonary effort is normal.     Comments: trach Neurological:     Mental Status: He is alert.             Vital Signs: BP 121/87 (BP Location: Right Arm)   Pulse 77   Temp 98.7 F (37.1 C) (Axillary)   Resp 16   Ht 5\' 7"  (1.702 m)   Wt 115.9 kg   SpO2 93%   BMI 40.02 kg/m  SpO2:  SpO2: 93 % O2 Device: O2 Device: Tracheostomy Collar O2 Flow Rate: O2 Flow Rate (L/min): 5 L/min  Intake/output summary:  Intake/Output Summary (Last 24 hours) at 09/28/2022 1440 Last data filed at 09/28/2022 1228 Gross per 24 hour  Intake 390 ml  Output 2350 ml  Net -1960 ml   LBM: Last BM Date : 09/27/22 Baseline Weight: Weight: 100 kg Most recent weight: Weight: 115.9 kg       Palliative Assessment/Data: PPS: 30%      Patient Active Problem List   Diagnosis Date Noted   Ventilator associated pneumonia ([place]) 09/15/2022   Leukocytosis 09/15/2022   Fever 09/15/2022   Hypernatremia    Tracheostomy in place [facility])    Acute respiratory failure with hypoxia (HCC)    Aspiration pneumonia of both lungs (HCC)    ICH (intracerebral hemorrhage) ([place]) 08/11/2022   Intracranial hemorrhage ([place]) 08/26/2022   Pressure injury of skin 08/31/2022    Palliative Care Assessment & Plan    Assessment/Recommendations/Plan  Continue current plan of care SLP consult for cognitive/linguistic evaluation and eval for supplemental communication devices Dustin Jones to continue to coordinate time for family meeting  Code Status: Full code  Prognosis:  Unable to determine  Discharge Planning: To Be Determined  Care plan was discussed with patient's daughter Dustin Jones  Thank you for allowing the Palliative Medicine Team to assist in the care of this patient.  Greater than 50%  of this time was spent counseling and coordinating care related to the above assessment and plan.  Dustin Jones,  AGNP-C Palliative Medicine   Please contact Palliative Medicine Team phone at [phone] for questions and concerns.

## 2022-09-29 ENCOUNTER — Other Ambulatory Visit (HOSPITAL_COMMUNITY): Payer: Self-pay | Admitting: Emergency Medicine

## 2022-09-29 ENCOUNTER — Inpatient Hospital Stay (HOSPITAL_COMMUNITY): Payer: Medicaid Other

## 2022-09-29 HISTORY — PX: IR GASTROSTOMY TUBE MOD SED: IMG625

## 2022-09-29 HISTORY — PX: IR IVC FILTER PLMT / S&I /IMG GUID/MOD SED: IMG701

## 2022-09-29 LAB — COMPREHENSIVE METABOLIC PANEL
ALT: 40 U/L (ref 0–44)
AST: 29 U/L (ref 15–41)
Albumin: 3.1 g/dL — ABNORMAL LOW (ref 3.5–5.0)
Alkaline Phosphatase: 72 U/L (ref 38–126)
Anion gap: 9 (ref 5–15)
BUN: 42 mg/dL — ABNORMAL HIGH (ref 8–23)
CO2: 29 mmol/L (ref 22–32)
Calcium: 9.9 mg/dL (ref 8.9–10.3)
Chloride: 101 mmol/L (ref 98–111)
Creatinine, Ser: 1 mg/dL (ref 0.61–1.24)
GFR, Estimated: >60 mL/min (ref >60)
Glucose, Bld: 126 mg/dL — ABNORMAL HIGH (ref 70–99)
Potassium: 4.4 mmol/L (ref 3.5–5.1)
Sodium: 139 mmol/L (ref 135–145)
Total Bilirubin: 0.6 mg/dL (ref 0.3–1.2)
Total Protein: 7.5 g/dL (ref 6.5–8.1)

## 2022-09-29 LAB — CBC
HCT: 40.1 % (ref 39.0–52.0)
Hemoglobin: 13.1 g/dL (ref 13.0–17.0)
MCH: 30.2 pg (ref 26.0–34.0)
MCHC: 32.7 g/dL (ref 30.0–36.0)
MCV: 92.4 fL (ref 80.0–100.0)
Platelets: 182 10*3/uL (ref 150–400)
RBC: 4.34 MIL/uL (ref 4.22–5.81)
RDW: 12.5 % (ref 11.5–15.5)
WBC: 41.9 10*3/uL — ABNORMAL HIGH (ref 4.0–10.5)
nRBC: 0 % (ref 0.0–0.2)

## 2022-09-29 LAB — GLUCOSE, CAPILLARY
Glucose-Capillary: 104 mg/dL — ABNORMAL HIGH (ref 70–99)
Glucose-Capillary: 124 mg/dL — ABNORMAL HIGH (ref 70–99)
Glucose-Capillary: 124 mg/dL — ABNORMAL HIGH (ref 70–99)
Glucose-Capillary: 114 mg/dL — ABNORMAL HIGH (ref 70–99)
Glucose-Capillary: 110 mg/dL — ABNORMAL HIGH (ref 70–99)

## 2022-09-29 LAB — PROTIME-INR
INR: 1.1 (ref 0.8–1.2)
Prothrombin Time: 13.8 seconds (ref 11.4–15.2)

## 2022-09-29 MED ORDER — GLUCAGON HCL RDNA (DIAGNOSTIC) 1 MG IJ SOLR
INTRAMUSCULAR | Status: AC | PRN
  Administered 2022-09-29: 1 mg via INTRAVENOUS

## 2022-09-29 MED ORDER — FENTANYL CITRATE (PF) 100 MCG/2ML IJ SOLN
INTRAMUSCULAR | Status: AC
  Filled 2022-09-29: qty 2

## 2022-09-29 MED ORDER — MIDAZOLAM HCL 2 MG/2ML IJ SOLN
INTRAMUSCULAR | Status: AC
  Filled 2022-09-29: qty 2

## 2022-09-29 MED ORDER — CEFAZOLIN SODIUM-DEXTROSE 2-4 GM/100ML-% IV SOLN: 2.0000 g | Freq: Once | INTRAVENOUS | Status: AC

## 2022-09-29 MED ORDER — IOPAMIDOL (ISOVUE-300) INJECTION 61%
50.0000 mL | Freq: Once | INTRAVENOUS | Status: AC | PRN
  Administered 2022-09-29: 30 mL via INTRAVENOUS

## 2022-09-29 MED ORDER — IOHEXOL 300 MG/ML  SOLN
50.0000 mL | Freq: Once | INTRAMUSCULAR | Status: AC | PRN
  Administered 2022-09-29: 25 mL

## 2022-09-29 MED ORDER — LIDOCAINE HCL 1 % IJ SOLN
INTRAMUSCULAR | Status: AC
  Administered 2022-09-29: 15 mL
  Filled 2022-09-29: qty 20

## 2022-09-29 MED ORDER — MIDAZOLAM HCL 2 MG/2ML IJ SOLN
INTRAMUSCULAR | Status: AC | PRN
  Administered 2022-09-29: .5 mg via INTRAVENOUS
  Administered 2022-09-29: 1 mg via INTRAVENOUS
  Administered 2022-09-29: .5 mg via INTRAVENOUS

## 2022-09-29 MED ORDER — GLUCAGON HCL RDNA (DIAGNOSTIC) 1 MG IJ SOLR
INTRAMUSCULAR | Status: AC
  Filled 2022-09-29: qty 1

## 2022-09-29 MED ORDER — CEFAZOLIN SODIUM-DEXTROSE 2-4 GM/100ML-% IV SOLN: 2.0000 g | Freq: Once | INTRAVENOUS | Status: DC

## 2022-09-29 MED ORDER — CEFAZOLIN SODIUM-DEXTROSE 2-4 GM/100ML-% IV SOLN
INTRAVENOUS | Status: AC
  Administered 2022-09-29: 2 g via INTRAVENOUS
  Filled 2022-09-29: qty 100

## 2022-09-29 MED ORDER — CEFAZOLIN SODIUM-DEXTROSE 1-4 GM/50ML-% IV SOLN: 1.0000 g | INTRAVENOUS | Status: DC

## 2022-09-29 MED ORDER — FENTANYL CITRATE (PF) 100 MCG/2ML IJ SOLN
INTRAMUSCULAR | Status: AC | PRN
  Administered 2022-09-29 (×4): 25 ug via INTRAVENOUS

## 2022-09-29 NOTE — Progress Notes (Signed)
PT Cancellation Note  Patient Details Name: Dustin Jones MRN: [id number] DOB: [birth year]   Cancelled Treatment:    Reason Eval/Treat Not Completed: Medical issues which prohibited therapy Recent events noted since last therapy session. BIL femoral DVTs and plan for IVC filter placement due to inability to anticoagulate.  Will hold treatment until post-op and follow-up for reassessment.  Candie Mile, PT, DPT Physical Therapist Acute Rehabilitation Services [place] [facility]  09/29/2022, 8:27 AM

## 2022-09-29 NOTE — Hospital Course (Addendum)
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  9/28 admitted- Intracranial Hemorrhage/ R MCA ruptured aneurysm s/p ct head, TTE LVEF of 60-65% 9/29 MRI brain:Large acute intraparenchymal hemorrhage- extension into the right-greater-than-left lateral ventricle, third ventricle, and fourth ventricle. 9/29-Fever and  pneumonia,MSSA in resp culture wbc up to 56k 9/29 self extubated, required reintubation 10/2 Family identified  10/15-transferred to the hospitalist service>transferred back to ICU for worsening respiratory status> Needing back to be on vent.  09/26/2022>> on 60% ATC, tolerating well. Sats 93% 09/29/22: IVC filter placed blue renal events and PEG tube placed by IR

## 2022-09-29 NOTE — Progress Notes (Signed)
Patient has an order for omnipaque @ [facility], med not available,  pharmacy notified and they stated for this nurse to call radiology department for med. Radiology called and they told this nurse that they do not have this med. Doctor on call and charge nurse also notified of this med not available per radiology.

## 2022-09-29 NOTE — Progress Notes (Signed)
Speech Language Pathology Treatment: Dysphagia  Patient Details Name: Dustin Jones MRN: [id number] DOB: 1961-02-16 Today's Date: 09/29/2022 Time: 5284-1324 SLP Time Calculation (min) (ACUTE ONLY): 17 min  Assessment / Plan / Recommendation Clinical Impression  Pt was seen for dysphagia treatment. Alertness was adequate for p.o. trials, but still suboptimal. Pt demonstrated some improvement in oropharyngeal swallow function as evidenced by initiation of manipulation and mastication of ice chips without prompts/cues. Pt tolerated limited ice chips without overt s/s of aspiration. He exhibited oral holding with puree despite prompts/cues and these boluses were ultimately removed via oral suction. PMSV was donned for the full session; pt tolerated it with stable vitals, and no evidence of air trapping, but no verbalization was noted despite support from SLP. It is recommended that the pt's NPO status be maintained at this time, but initiation of at least ice chips in the future shows some promise. SLP will continue to follow pt.     HPI HPI: Dustin Jones is a 61 y.o. male who is homeless, presenting 9/28 after being found down by bystanders. 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 S/P coil embolization of R ICA aneurysm 9/28. Intubated 9/29. Trach placement 10/10. Cortrak 10/11.  No PMH on file.      SLP Plan  Continue with current plan of care  Patient needs continued Speech Lanaguage Pathology Services   Recommendations for follow up therapy are one component of a multi-disciplinary discharge planning process, led by the attending physician.  Recommendations may be updated based on patient status, additional functional criteria and insurance authorization.    Recommendations  Diet recommendations: NPO Medication Administration: Via alternative means      Patient may use  Passy-Muir Speech Valve: with SLP only PMSV Supervision: Full         Oral Care Recommendations: Oral care QID Follow Up Recommendations: Skilled nursing-short term rehab (<3 hours/day) Assistance recommended at discharge: Frequent or constant Supervision/Assistance SLP Visit Diagnosis: Cognitive communication deficit (R41.841) Plan: Continue with current plan of care         Dustin Jones I. Dustin Jones, [place], [place] Office number [phone]  Dustin Jones  09/29/2022, 3:29 PM

## 2022-09-29 NOTE — Evaluation (Signed)
Speech Language Pathology Evaluation Patient Details Name: Dustin Jones MRN: [id number] DOB: 1961/11/10 Today's Date: 09/29/2022 Time: 4097-3532 SLP Time Calculation (min) (ACUTE ONLY): 18 min  Problem List:  Patient Active Problem List   Diagnosis Date Noted   Ventilator associated pneumonia (HCC) 09/15/2022   Leukocytosis 09/15/2022   Fever 09/15/2022   Hypernatremia    Tracheostomy in place [facility])    Acute respiratory failure with hypoxia (HCC)    Aspiration pneumonia of both lungs (HCC)    ICH (intracerebral hemorrhage) (HCC) September 30, 2022   Intracranial hemorrhage (HCC) September 30, 2022   Pressure injury of skin September 30, 2022   Past Medical History: History reviewed. No pertinent past medical history. Past Surgical History:  Past Surgical History:  Procedure Laterality Date   IR ANGIO INTRA EXTRACRAN SEL INTERNAL CAROTID BILAT MOD SED  Sep 30, 2022   IR ANGIOGRAM FOLLOW UP STUDY  30-Sep-2022   IR ANGIOGRAM FOLLOW UP STUDY  30-Sep-2022   IR TRANSCATH/EMBOLIZ  Sep 30, 2022   RADIOLOGY WITH ANESTHESIA N/A 2022-09-30   Procedure: IR WITH ANESTHESIA;  Surgeon: Lisbeth Renshaw, MD;  Location: [facility] OR;  Service: Radiology;  Laterality: N/A;   HPI:  Dustin Jones is a 61 y.o. male who is homeless, presenting 9/28 after being found down by bystanders. 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 S/P coil embolization of R ICA aneurysm 9/28. Intubated 9/29. Trach placement 10/10. Cortrak 10/11.  No PMH on file.   Assessment / Plan / Recommendation Clinical Impression  Pt was seen for speech-language evaluation which was limited due to pt's lethargy since pt had recently returned from IR. He roused with stimulation and PMSV was donned for the evaluation. Pt's response to questions and participation were reduced. He attended to the SLP with intermittent tactile/verbal prompts. He did not follow  simple commands despite prompts/cues. Pt inconsistently vocalized in response to SLP's questions/comments to indicate yes/no, but he did not respond to any structured/abstract yes/no questions. Use of a communication board was attempted, but pt did not visually attend to the board or identify any items named by the SLP. Skilled SLP services are clinically indicated at this time for further assessment and for intervention.    SLP Assessment  SLP Recommendation/Assessment: Patient needs continued Speech Lanaguage Pathology Services SLP Visit Diagnosis: Cognitive communication deficit (R41.841)    Recommendations for follow up therapy are one component of a multi-disciplinary discharge planning process, led by the attending physician.  Recommendations may be updated based on patient status, additional functional criteria and insurance authorization.    Follow Up Recommendations  Skilled nursing-short term rehab (<3 hours/day)    Assistance Recommended at Discharge  Frequent or constant Supervision/Assistance  Functional Status Assessment Patient has had a recent decline in their functional status and demonstrates the ability to make significant improvements in function in a reasonable and predictable amount of time.  Frequency and Duration min 2x/week  2 weeks      SLP Evaluation Cognition  Overall Cognitive Status: Impaired/Different from baseline Arousal/Alertness: Awake/alert Orientation Level: Oriented to person Attention: Focused;Sustained Focused Attention: Appears intact Sustained Attention: Impaired Sustained Attention Impairment: Verbal basic       Comprehension  Auditory Comprehension Overall Auditory Comprehension:  (difficult to assess due to paucity of verbal output/resposnes) Yes/No Questions: Impaired Basic Immediate Environment Questions:  (0/4; no responses provided) Commands: Impaired One Step Basic Commands:  (0/5)    Expression Verbal Expression Overall Verbal  Expression: Impaired Initiation: Impaired Automatic  Speech:  (no response despite prompts/cues) Level of Generative/Spontaneous Verbalization:  (vocalization of yes/no) Repetition: Impaired Level of Impairment: Word level (0/4)   Oral / Motor  Oral Motor/Sensory Function Overall Oral Motor/Sensory Function:  (unable to assess) Motor Speech Overall Motor Speech:  (difficult to assess)           Dustin Jones, Dustin Jones, Dustin Jones Office number [phone]  Dustin Jones 09/29/2022, 3:15 PM

## 2022-09-29 NOTE — Progress Notes (Signed)
Palliative-   Chart reviewed.   Appreciate SLP cognitive/linguistic eval.   Received call from patient's daughter Bunnie Philips- she would like family conference call Saturday morning. Will plan for GOC discussion at that time.   Mariana Kaufman, AGNP-C Palliative Medicine  No charge

## 2022-09-29 NOTE — Progress Notes (Signed)
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  9/28 admitted- Intracranial Hemorrhage/ R MCA ruptured aneurysm s/p ct head, TTE LVEF of 60-65% 9/29 MRI brain:Large acute intraparenchymal hemorrhage- extension into the right-greater-than-left lateral ventricle, third ventricle, and fourth ventricle. 9/29-Fever and  pneumonia,MSSA in resp culture wbc up to 56k 9/29 self extubated, required reintubation 10/2 Family identified  10/15-transferred to the hospitalist service>transferred back to ICU for worsening respiratory status> Needing back to be on vent.  09/26/2022>> on 60% ATC, tolerating well. Sats 93% 09/29/22: IVC filter placed blue renal events and PEG tube placed by IR    Subjective: Seen and examined, eyes open follows some commands unable to move left side On trach collar nonverbal Patient underwent IVC filter placement and PEG tube placement by IR this morning Overnight afebrile on trach collar 3 L saturating well Labs this morning with persistent leukocytosis 14.9 K, fairly stable CMP   Assessment and Plan: Principal Problem:   ICH (intracerebral hemorrhage) ([place]) Active Problems:   Intracranial hemorrhage (HCC)   Pressure injury of skin   Acute respiratory failure with hypoxia (HCC)   Aspiration pneumonia of both lungs (HCC)   Hypernatremia   Tracheostomy in place ([place])   Ventilator associated pneumonia ([place])    Leukocytosis   Fever    Acute hypoxic respiratory failure 2/2 pneumonia Pneumonia due to MSSA and Enterobacter S/P tracheostomy in place-on trach collar: Respiratory status stable on trach collar.  Completed antibiotics.  Continue current supplemental oxygen trach collar bronchodilators, ongoing respiratory support trach care.  Pulmonary following intermittently  Acute toxic metabolic encephalopathy: Remains nonverbal, no response spontaneously moving his right side no movement on the left side with flaccid weakness, not making eye contact pupils reactive, continue supportive care with trach collar f tube feeding.  Frequent position changing.  Dysphagia stroke:s/p PEG tube by IR.  Continue NGT feeding- hold per ir for peg.On PPI.  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  CLL versus leukemoid reaction: On admission 70.5 K, peaked>75k- holding now in 21 K Recent Labs  Lab 09/23/22 0504 09/25/22 0319 09/27/22 0554 09/29/22 0458  WBC 41.1* 42.5* 41.8* 41.9*    AKI-ruled out Electrolyte imbalance with hyperkalemia hypernatremia resolved   Type 2 diabetes mellitus A1c 6.5: Blood sugar well controlled, on sliding scale insulin Recent Labs  Lab 09/28/22 1647 09/28/22 1925 09/28/22 2309 09/29/22 0345 09/29/22 0804  GLUCAP 128* 104* 152* 124* 124*    Depression: now w/ encephalopathy-supportive care Thrombocytopenia: Monitor platelet count Nonsustained V. Tach: Monitor electrolytes  At risk of malnutrition with inadequate oral intake plan for feeding tube Nutrition Problem: Inadequate oral intake Etiology: inability to eat Signs/Symptoms: NPO status Interventions: Prostat, Tube  feeding   Morbid  obesity:Patient's Body mass index is 40.02 kg/m.   DVT prophylaxis: heparin injection 5,000 Units Start: 09/03/22 1400 SCD's Start: 08/06/2022 0930 Code Status:   Code Status: Full Code Family Communication: plan of care discussed w/ staffs  Patient status is: Inpatient because of respiratory failure, encephalopathy  Level of care: Progressive  Dispo: The patient is from: hom            Anticipated disposition: TBD  Mobility Assessment (last 72 hours)     Mobility Assessment     Row Name 09/29/22 0728 09/28/22 2016 09/27/22 0815       Does patient have an order for bedrest or is patient medically unstable Yes- Bedfast (Level 1) - Complete Yes- Bedfast (Level 1) - Complete Yes- Bedfast (Level 1) - Complete     What is the highest level of mobility based on the progressive mobility assessment? Level 1 (Bedfast) - Unable to balance while sitting on edge of bed Level 1 (Bedfast) - Unable to balance while sitting on edge of bed Level 1 (Bedfast) - Unable to balance while sitting on edge of bed     Is the above level different from baseline mobility prior to current illness? Yes - Recommend PT order Yes - Recommend PT order Yes - Recommend PT order               Objective: Vitals last 24 hrs: Vitals:   09/29/22 1245 09/29/22 1250 09/29/22 1255 09/29/22 1310  BP: (!) 136/95 (!) 159/96 (!) 163/102 (!) 169/93  Pulse: 74 83 73 83  Resp: 10 13 12 13   Temp:      TempSrc:      SpO2: 92% 95% 94% 97%  Weight:      Height:       Weight change:   Physical Examination: General exam: alert awake, follows some commands, unable to move left side HEENT:Oral mucosa moist, Ear/Nose WNL grossly Respiratory system: bilaterally clear BS, TC+ no use of accessory muscle Cardiovascular system: S1 & S2 +, No JVD. Gastrointestinal system: Abdomen soft, PEG+,Obese,BS+ Nervous System:Alert, awake, following some commands.   Extremities: LE edema NEG Skin: No rashes,no  icterus. MSK: Normal muscle bulk,tone, power  Medications reviewed:  Scheduled Meds:  albuterol  2.5 mg Nebulization TID   amLODipine  10 mg Per Tube Daily   arformoterol  15 mcg Nebulization BID   feeding supplement (PROSource TF20)  60 mL Per Tube BID   fiber  1 packet Per Tube BID   free water  200 mL Per Tube Q6H   heparin injection (subcutaneous)  5,000 Units Subcutaneous Q8H   insulin aspart  0-15 Units Subcutaneous Q4H   iohexol  100 mL Oral Once   lisinopril  40 mg Per Tube Daily   metoprolol tartrate  25 mg Per Tube BID   mouth rinse  15 mL Mouth Rinse 4 times per day   pantoprazole  40 mg Per Tube Daily   revefenacin  175 mcg Nebulization Daily   Continuous Infusions:   ceFAZolin (ANCEF) IV     feeding supplement (JEVITY 1.5 CAL/FIBER) Stopped (09/29/22 0005)    Pressure Injury 08/26/2022 Pretibial Distal;Right;Lateral Stage 2 -  Partial thickness loss of dermis presenting as a shallow open injury with a red, pink wound bed without slough. Old healing ulcer (Active)  08/23/2022 1630  Location: Pretibial  Location Orientation: Distal;Right;Lateral  Staging: Stage 2 -  Partial thickness loss of dermis presenting as a shallow open injury with a red, pink wound  bed without slough.  Wound Description (Comments): Old healing ulcer  Present on Admission: Yes  Dressing Type Foam - Lift dressing to assess site every shift 09/29/22 7829   Diet Order             Diet NPO time specified Except for: Ice Chips  Diet effective now                    Nutrition Problem: Inadequate oral intake Etiology: inability to eat Signs/Symptoms: NPO status Interventions: Prostat, Tube feeding   Intake/Output Summary (Last 24 hours) at 09/29/2022 1417 Last data filed at 09/29/2022 1159 Gross per 24 hour  Intake 960 ml  Output 600 ml  Net 360 ml   Net IO Since Admission: -5,661.46 mL [09/29/22 1417]  Wt Readings from Last 3 Encounters:  09/27/22 115.9 kg     Unresulted Labs  (From admission, onward)    None     Data Reviewed: I have personally reviewed following labs and imaging studies CBC: Recent Labs  Lab 09/23/22 0504 09/25/22 0319 09/27/22 0554 09/29/22 0458  WBC 41.1* 42.5* 41.8* 41.9*  NEUTROABS 6.2 6.3  --   --   HGB 12.4* 12.6* 11.9* 13.1  HCT 39.4 40.3 36.8* 40.1  MCV 94.9 94.8 94.1 92.4  PLT 175 176 177 562   Basic Metabolic Panel: Recent Labs  Lab 09/23/22 0504 09/25/22 0319 09/27/22 0554 09/28/22 0538 09/29/22 0458  NA 139 141 139 139 139  K 4.2 4.5 4.4 3.9 4.4  CL 105 107 103 99 101  CO2 25 26 28 29 29   GLUCOSE 152* 114* 146* 129* 126*  BUN 40* 54* 56* 51* 42*  CREATININE 0.93 1.18 1.31* 1.05 1.00  CALCIUM 9.3 9.6 9.7 10.0 9.9  MG  --  2.1 2.0  --   --   PHOS  --  4.4  --  4.1  --    GFR: Estimated Creatinine Clearance: 94.4 mL/min (by C-G formula based on SCr of 1 mg/dL). Liver Function Tests: Recent Labs  Lab 09/28/22 0538 09/29/22 0458  AST  --  29  ALT  --  40  ALKPHOS  --  72  BILITOT  --  0.6  PROT  --  7.5  ALBUMIN 3.0* 3.1*   No results for input(s): "LIPASE", "AMYLASE" in the last 168 hours. No results for input(s): "AMMONIA" in the last 168 hours. Coagulation Profile: Recent Labs  Lab 09/29/22 0458  INR 1.1   BNP (last 3 results) No results for input(s): "PROBNP" in the last 8760 hours. HbA1C: No results for input(s): "HGBA1C" in the last 72 hours. CBG: Recent Labs  Lab 09/28/22 1647 09/28/22 1925 09/28/22 2309 09/29/22 0345 09/29/22 0804  GLUCAP 128* 104* 152* 124* 124*   Lipid Profile: No results for input(s): "CHOL", "HDL", "LDLCALC", "TRIG", "CHOLHDL", "LDLDIRECT" in the last 72 hours. Thyroid Function Tests: No results for input(s): "TSH", "T4TOTAL", "FREET4", "T3FREE", "THYROIDAB" in the last 72 hours. Sepsis Labs: No results for input(s): "PROCALCITON", "LATICACIDVEN" in the last 168 hours.  Recent Results (from the past 240 hour(s))  Culture, Respiratory w Gram Stain      Status: None   Collection Time: 09/19/22  7:09 PM   Specimen: Bronchoalveolar Lavage; Respiratory  Result Value Ref Range Status   Specimen Description BRONCHIAL ALVEOLAR LAVAGE  Final   Special Requests NONE  Final   Gram Stain   Final    RARE WBC PRESENT, PREDOMINANTLY MONONUCLEAR RARE GRAM VARIABLE ROD Performed at  [facility], [place] [address], [place], [place] [address]    Culture FEW ENTEROBACTER CLOACAE  Final   Report Status 09/22/2022 FINAL  Final   Organism ID, Bacteria ENTEROBACTER CLOACAE  Final      Susceptibility   Enterobacter cloacae - MIC*    CEFAZOLIN >=64 RESISTANT Resistant     CEFEPIME >=32 RESISTANT Resistant     CEFTAZIDIME >=64 RESISTANT Resistant     CIPROFLOXACIN <=0.25 SENSITIVE Sensitive     GENTAMICIN <=1 SENSITIVE Sensitive     IMIPENEM <=0.25 SENSITIVE Sensitive     TRIMETH/SULFA <=20 SENSITIVE Sensitive     PIP/TAZO >=128 RESISTANT Resistant     * FEW ENTEROBACTER CLOACAE    Antimicrobials: Anti-infectives (From admission, onward)    Start     Dose/Rate Route Frequency Ordered Stop   09/29/22 1445  ceFAZolin (ANCEF) IVPB 2g/100 mL premix        2 g 200 mL/hr over 30 Minutes Intravenous  Once 09/29/22 1346     09/29/22 1145  ceFAZolin (ANCEF) IVPB 1 g/50 mL premix  Status:  Discontinued        1 g 100 mL/hr over 30 Minutes Intravenous To Radiology 09/29/22 1057 09/29/22 1059   09/29/22 1145  ceFAZolin (ANCEF) IVPB 2g/100 mL premix        2 g 200 mL/hr over 30 Minutes Intravenous  Once 09/29/22 1059 09/29/22 1159   09/14/22 1400  ceFEPIme (MAXIPIME) 2 g in sodium chloride 0.9 % 100 mL IVPB        2 g 200 mL/hr over 30 Minutes Intravenous Every 8 hours 09/14/22 1312 09/21/22 0922   09/10/22 0830  ceFAZolin (ANCEF) IVPB 1 g/50 mL premix  Status:  Discontinued        1 g 100 mL/hr over 30 Minutes Intravenous Every 8 hours 09/10/22 0738 09/10/22 0821   09/10/22 0830  ceFAZolin (ANCEF) IVPB 2g/100 mL premix  Status:  Discontinued        2  g 200 mL/hr over 30 Minutes Intravenous Every 8 hours 09/10/22 0821 09/14/22 1312   09/07/22 0815  ceFAZolin (ANCEF) IVPB 2g/100 mL premix        2 g 200 mL/hr over 30 Minutes Intravenous Every 8 hours 09/07/22 0807 09/08/22 1742   09/04/22 2200  Ampicillin-Sulbactam (UNASYN) 3 g in sodium chloride 0.9 % 100 mL IVPB  Status:  Discontinued        3 g 200 mL/hr over 30 Minutes Intravenous Every 24 hours 09/04/22 0722 09/04/22 1506   09/04/22 2200  ceFAZolin (ANCEF) IVPB 1 g/50 mL premix  Status:  Discontinued        1 g 100 mL/hr over 30 Minutes Intravenous Every 12 hours 09/04/22 1506 09/07/22 0807   09/02/22 1030  Ampicillin-Sulbactam (UNASYN) 3 g in sodium chloride 0.9 % 100 mL IVPB  Status:  Discontinued        3 g 200 mL/hr over 30 Minutes Intravenous Every 6 hours 09/02/22 1009 09/04/22 0722      Culture/Microbiology    Component Value Date/Time   SDES BRONCHIAL ALVEOLAR LAVAGE 09/19/2022 1909   SPECREQUEST NONE 09/19/2022 1909   CULT FEW ENTEROBACTER CLOACAE 09/19/2022 1909   REPTSTATUS 09/22/2022 FINAL 09/19/2022 1909    Other culture-see note  Radiology Studies: No results found.   LOS: 28 days   Antonieta Pert, MD [facility]  09/29/2022, 2:17 PM

## 2022-09-29 NOTE — Progress Notes (Signed)
SLP Cancellation Note  Patient Details Name: Dustin Jones MRN: [id number] DOB: Apr 22, 1961   Cancelled treatment:       Reason Eval/Treat Not Completed: Patient at procedure or test/unavailable (Pt off unit at this time. SLP will follow up on subsequent date.)  Seda Kronberg I. Hardin Negus, [place], [place] Office number [phone]  [name] 09/29/2022, 1:24 PM

## 2022-09-29 NOTE — Procedures (Signed)
Interventional Radiology Procedure:   Indications: History of DVT and intracranial hemorrhage.  Procedure: 1) Placement of IVC filter 2) Placement of gastrostomy tube  Findings: 1) IVC filter below renal veins.  2) 16 Fr balloon retention gastrostomy tube in place.  See full report in IMAGING  Complications: No immediate complications noted.     EBL: Minimal  Plan: Plan to start tube feeds on 09/30/22   Sherron Mummert R. Anselm Pancoast, MD  Pager: [phone]

## 2022-09-30 ENCOUNTER — Other Ambulatory Visit: Payer: Self-pay | Admitting: Radiology

## 2022-09-30 DIAGNOSIS — I629 Nontraumatic intracranial hemorrhage, unspecified: Secondary | ICD-10-CM

## 2022-09-30 LAB — CBC
HCT: 39.5 % (ref 39.0–52.0)
Hemoglobin: 12.8 g/dL — ABNORMAL LOW (ref 13.0–17.0)
MCH: 30.3 pg (ref 26.0–34.0)
MCHC: 32.4 g/dL (ref 30.0–36.0)
MCV: 93.4 fL (ref 80.0–100.0)
Platelets: 218 10*3/uL (ref 150–400)
RBC: 4.23 MIL/uL (ref 4.22–5.81)
RDW: 12.8 % (ref 11.5–15.5)
WBC: 47.5 10*3/uL — ABNORMAL HIGH (ref 4.0–10.5)
nRBC: 2.7 % — ABNORMAL HIGH (ref 0.0–0.2)

## 2022-09-30 LAB — GLUCOSE, CAPILLARY
Glucose-Capillary: 113 mg/dL — ABNORMAL HIGH (ref 70–99)
Glucose-Capillary: 115 mg/dL — ABNORMAL HIGH (ref 70–99)
Glucose-Capillary: 120 mg/dL — ABNORMAL HIGH (ref 70–99)
Glucose-Capillary: 136 mg/dL — ABNORMAL HIGH (ref 70–99)
Glucose-Capillary: 139 mg/dL — ABNORMAL HIGH (ref 70–99)
Glucose-Capillary: 150 mg/dL — ABNORMAL HIGH (ref 70–99)

## 2022-09-30 NOTE — Progress Notes (Signed)
Physical Therapy Treatment Patient Details Name: Dustin Jones MRN: [id number] DOB: [birth year] Today's Date: 09/30/2022   History of Present Illness The pt is a 61 y.o. unidentified homeless male presenting 9/28 after being found down by bystanders. 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 S/P coil embolization of R ICA aneurysm 9/28. Intubated 9/29. Trach placement 10/10. No PMH on file.    PT Comments    Pt received supine with eyes closed, becoming more awake and alert with verbal stimuli and maintaining open eyes throughout session with intermittent tracking. Pt continues to demonstrate ability to follow simple commands throughout session with noted decrease as pt fatigues. Pt continues to need significant +2 to +3 total assist for all mobility with pt making progress and able to maintain sitting EOB for ~5 seconds with down to mod assist of 2 to maintain. Pt continues to demonstrate strong push with RUE to L. Pt continues to benefit from skilled PT services to progress toward functional mobility goals.    Recommendations for follow up therapy are one component of a multi-disciplinary discharge planning process, led by the attending physician.  Recommendations may be updated based on patient status, additional functional criteria and insurance authorization.  Follow Up Recommendations  Skilled nursing-short term rehab (<3 hours/day) (vs LTACH) Can patient physically be transported by private vehicle: No   Assistance Recommended at Discharge Frequent or constant Supervision/Assistance  Patient can return home with the following Two people to help with walking and/or transfers;Two people to help with bathing/dressing/bathroom;Assistance with cooking/housework;Assistance with feeding;Direct supervision/assist for medications management;Direct supervision/assist for financial  management;Help with stairs or ramp for entrance;Assist for transportation   Equipment Recommendations  Other (comment) (TBA)    Recommendations for Other Services       Precautions / Restrictions Precautions Precautions: Fall Precaution Comments: trach, flexiseal; cortrak; Restrictions Weight Bearing Restrictions: No     Mobility  Bed Mobility Overal bed mobility: Needs Assistance Bed Mobility: Rolling, Supine to Sit, Sit to Supine Rolling: Total assist, +2 for physical assistance, +2 for safety/equipment   Supine to sit: Total assist, +2 for physical assistance Sit to supine: Total assist, +2 for physical assistance, +2 for safety/equipment   General bed mobility comments: with HOB elevated PTA and MT helicoptered pt to EOB, pt with strong push with R UE requiring, pt able to hold RUE on knee total down to mod intermittently to maintain sitting balance    Transfers                   General transfer comment: deferred, unsafe at this time    Ambulation/Gait               General Gait Details: unable   Stairs             Wheelchair Mobility    Modified Rankin (Stroke Patients Only)       Balance Overall balance assessment: Needs assistance Sitting-balance support: Feet supported, Single extremity supported Sitting balance-Leahy Scale: Zero Sitting balance - Comments: dependent on external assist                                    Cognition Arousal/Alertness: Awake/alert Behavior During Therapy: Flat affect Overall Cognitive Status: Impaired/Different from baseline Area of Impairment: Attention, Following commands, Problem solving  Current Attention Level: Focused   Following Commands: Follows one step commands inconsistently, Follows one step commands with increased time     Problem Solving: Slow processing, Decreased initiation, Difficulty sequencing, Requires verbal cues, Requires tactile  cues General Comments: Pt making eye contact and did track during session intermittently. Pt following simple cues intermittently and moving right UE and lower spontaneously but also reaching for rail with right hand when prompted        Exercises      General Comments General comments (skin integrity, edema, etc.): pt wtith productive cough at end of session, RN present throughout, SpO2 stable on TC      Pertinent Vitals/Pain Pain Assessment Pain Assessment: Faces Faces Pain Scale: No hurt Pain Intervention(s): Monitored during session    Home Living                          Prior Function            PT Goals (current goals can now be found in the care plan section) Acute Rehab PT Goals Patient Stated Goal: did not state PT Goal Formulation: Patient unable to participate in goal setting Time For Goal Achievement: 09/28/22    Frequency    Min 2X/week      PT Plan Current plan remains appropriate    Co-evaluation              AM-PAC PT "6 Clicks" Mobility   Outcome Measure  Help needed turning from your back to your side while in a flat bed without using bedrails?: Total Help needed moving from lying on your back to sitting on the side of a flat bed without using bedrails?: Total Help needed moving to and from a bed to a chair (including a wheelchair)?: Total Help needed standing up from a chair using your arms (e.g., wheelchair or bedside chair)?: Total Help needed to walk in hospital room?: Total Help needed climbing 3-5 steps with a railing? : Total 6 Click Score: 6    End of Session Equipment Utilized During Treatment: Oxygen Lurline Idol) Activity Tolerance: Patient limited by fatigue Patient left: in bed;with call bell/phone within reach;with bed alarm set;with nursing/sitter in room Nurse Communication: Mobility status;Need for lift equipment PT Visit Diagnosis: Muscle weakness (generalized) (M62.81);Difficulty in walking, not elsewhere  classified (R26.2);Other symptoms and signs involving the nervous system (W29.562)     Time: 1308-6578 PT Time Calculation (min) (ACUTE ONLY): 40 min  Charges:  $Therapeutic Activity: 38-52 mins                     Bailei Buist R. PTA Acute Rehabilitation Services Office: [place] 09/30/2022, 3:37 PM

## 2022-09-30 NOTE — Progress Notes (Signed)
Patient seen for g-tube site s/p placement of 20 Fr balloon rention gastrostomy tube on 10.26.23 in IR with Dr. Anselm Pancoast   Insertion site is clean, dry, dressed and appropriately tender to palpation. Tube is currently to tube feeds. Per RN at bedside. No issues.   Ok to begin using g-tube for medicines, tube feeds, free water, etc.  Please call IR for any questions or concerns regarding the g-tube.

## 2022-09-30 NOTE — Progress Notes (Signed)
SLP Cancellation Note  Patient Details Name: Dustin Jones MRN: [id number] DOB: 08-Mar-1961   Cancelled treatment:       Reason Eval/Treat Not Completed: Fatigue/lethargy limiting ability to participate (Pt unable to maintain alertness for long enough periods to participate. SLP will follow up on a subsequent date.)  Inri Sobieski I. Hardin Negus, [place], [place] Office number [phone]  [name] 09/30/2022, 1:55 PM

## 2022-09-30 NOTE — Progress Notes (Signed)
PROGRESS NOTE Dustin Jones  [id number] DOB: January 03, 1961 DOA: 08/19/2022 PCP: Marliss Coots, NP   Brief Narrative/Hospital Course: 61 year old male with history of hypertension, history of DVT admitted on 9/28  as CODE STROKE-last seen walking around 3 AM from in the morning around 8 AM face down admitted in ICU with working diagnosis of intracranial hemorrhage right MCA ruptured aneurysm, has had prolonged hospitalization including multiple procedures work-up including R ICA coil embolization of right R ICA terminus aneurysm on 09/02/2022, tracheostomy by CCM on 10/10 and transferred to [facility] on 10/22.  9/28 admitted- Intracranial Hemorrhage/ R MCA ruptured aneurysm s/p ct head, TTE LVEF of 60-65% 9/29 MRI brain:Large acute intraparenchymal hemorrhage- extension into the right-greater-than-left lateral ventricle, third ventricle, and fourth ventricle. 9/29-Fever and  pneumonia,MSSA in resp culture wbc up to 56k 9/29 self extubated, required reintubation 10/2 Family identified  10/15-transferred to the hospitalist service>transferred back to ICU for worsening respiratory status> Needing back to be on vent.  09/26/2022>> on 60% ATC, tolerating well. Sats 93% 09/29/22: IVC filter placed blue renal events and PEG tube placed by IR    Subjective: Seen and examined. Able to squeeze on right finger. RT changing trach collar this morning Overnight afebrile BP stable, on trach collar Labs showed WBC 47.5 Rectal tube in place with loose stool   Assessment and Plan: Principal Problem:   ICH (intracerebral hemorrhage) (HCC) Active Problems:   Intracranial hemorrhage (HCC)   Pressure injury of skin   Acute respiratory failure with hypoxia (HCC)   Aspiration pneumonia of both lungs (HCC)   Hypernatremia   Tracheostomy in place (HCC)   Ventilator associated pneumonia (HCC)   Leukocytosis   Fever  Acute hypoxic respiratory failure 2/2 pneumonia Pneumonia due to MSSA and Enterobacter S/P  tracheostomy in place-on trach collar: Respiratory status stable on trach collar.Completed antibiotics.  Continue trach management with aggressive risk pulmonary hygiene/respiratory care, suctioning.  Position changing. Pulmonary following intermittently  Acute toxic metabolic encephalopathy: Nonverbal, alert awake moving his right side only sometimes able to follow commands like squeezing fingers on RUE. left side flaccid weakness.Continue supportive care, frequent position changing.  Dysphagia stroke:s/p PEG tube by IR> hopefully start tube feeding 24 hours postprocedure per IR.  Continue PPI  Bilateral DVTs: Not a candidate for anticoagulation due to [place], s/p IVC filter  09/29/22 by IR.  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  Blood pressure stable.  Goal  Hypertension: Well-controlled on Metoprolol 25 twice daily, lisinopril 40 daily, amlodipine 10 mg.  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  Patient will need follow-up with hematology. Recent Labs  Lab 09/25/22 0319 09/27/22 0554 09/29/22 0458 09/30/22 0542  WBC 42.5* 41.8* 41.9* 47.5*     AKI-ruled out Electrolyte imbalance with hyperkalemia hypernatremia resolved   Type 2 diabetes mellitus A1c 6.5: Well-controlled, continue SSI Recent Labs  Lab 09/29/22 0804 09/29/22 1559 09/29/22 1955 09/30/22 0013 09/30/22 0436  GLUCAP 124* 114* 110* 139* 113*     Depression: now w/ encephalopathy-supportive care Thrombocytopenia: Monitor platelet count Nonsustained V. Tach: Monitor electrolytes Rectal tube in place [id number] prognosis does not appear bright, palliative care following planning for family conference Saturday  At risk of malnutrition with inadequate oral intake plan for feeding tube w/ PEG Nutrition Problem: Inadequate oral intake Etiology: inability to eat Signs/Symptoms: NPO  status Interventions: Prostat, Tube feeding   Morbid obesity:Patient's Body mass  index is 41.78 kg/m.   Pressure injury:Pretibial right diStal stage 2 INO:[facility] WOUND CARE  DVT prophylaxis: heparin injection 5,000 Units Start: 09/03/22 1400 SCD's Start: Sep 05, 2022 0930 Code Status:   Code Status: Full Code Family Communication: plan of care discussed w/ staffs  Patient status is: Inpatient because of respiratory failure, encephalopathy Level of care: Progressive  Dispo: The patient is from: home            Anticipated disposition: TBD   Objective: Vitals last 24 hrs: Vitals:   09/29/22 2359 09/30/22 0230 09/30/22 0400 09/30/22 0500  BP: 130/87  136/79   Pulse: 90 70 76   Resp: 20 13    Temp: 97.7 F (36.5 C)  97.9 F (36.6 C)   TempSrc: Axillary  Axillary   SpO2: 90% 92% 94%   Weight:    121 kg  Height:       Weight change:   Physical Examination: General exam: Eyes open, alert, follows minimal commands.   HEENT:Oral mucosa moist, Ear/Nose WNL grossly, dentition normal. Respiratory system: bilaterally conducted sounds with trach collar in place  Cardiovascular system: S1 & S2 +, regular rate Gastrointestinal system: Abdomen soft, dressing+NT,ND,BS+ Nervous System:Alert, awake, moving RUE only, left sided flaccid Extremities: LE ankle edema neg, lower extremities warm Skin: No rashes,no icterus. MSK: Normal muscle bulk,tone, power  PEG+, Rectal tube+  Medications reviewed:  Scheduled Meds:  albuterol  2.5 mg Nebulization TID   amLODipine  10 mg Per Tube Daily   arformoterol  15 mcg Nebulization BID   feeding supplement (PROSource TF20)  60 mL Per Tube BID   fiber  1 packet Per Tube BID   free water  200 mL Per Tube Q6H   heparin injection (subcutaneous)  5,000 Units Subcutaneous Q8H   insulin aspart  0-15 Units Subcutaneous Q4H   iohexol  100 mL Oral Once   lisinopril  40 mg Per Tube Daily   metoprolol tartrate  25 mg Per Tube BID   mouth rinse  15 mL Mouth  Rinse 4 times per day   pantoprazole  40 mg Per Tube Daily   revefenacin  175 mcg Nebulization Daily   Continuous Infusions:   ceFAZolin (ANCEF) IV     feeding supplement (JEVITY 1.5 CAL/FIBER) Stopped (09/29/22 0005)   Diet Order             Diet NPO time specified Except for: Ice Chips  Diet effective now                  Unresulted Labs (From admission, onward)    None     Data Reviewed: I have personally reviewed following labs and imaging studies CBC: Recent Labs  Lab 09/25/22 0319 09/27/22 0554 09/29/22 0458 09/30/22 0542  WBC 42.5* 41.8* 41.9* 47.5*  NEUTROABS 6.3  --   --   --   HGB 12.6* 11.9* 13.1 12.8*  HCT 40.3 36.8* 40.1 39.5  MCV 94.8 94.1 92.4 93.4  PLT 176 177 182 209    Basic Metabolic Panel: Recent Labs  Lab 09/25/22 0319 09/27/22 0554 09/28/22 0538 09/29/22 0458  NA 141 139 139 139  K 4.5 4.4 3.9 4.4  CL 107 103 99 101  CO2 26 28 29 29   GLUCOSE 114* 146* 129* 126*  BUN 54* 56* 51* 42*  CREATININE 1.18 1.31* 1.05 1.00  CALCIUM 9.6 9.7 10.0 9.9  MG 2.1 2.0  --   --   PHOS 4.4  --  4.1  --  GFR: Estimated Creatinine Clearance: 96.7 mL/min (by C-G formula based on SCr of 1 mg/dL). Liver Function Tests: Recent Labs  Lab 09/28/22 0538 09/29/22 0458  AST  --  29  ALT  --  40  ALKPHOS  --  72  BILITOT  --  0.6  PROT  --  7.5  ALBUMIN 3.0* 3.1*    Antimicrobials: Anti-infectives (From admission, onward)    Start     Dose/Rate Route Frequency Ordered Stop   09/29/22 1445  ceFAZolin (ANCEF) IVPB 2g/100 mL premix        2 g 200 mL/hr over 30 Minutes Intravenous  Once 09/29/22 1346     09/29/22 1145  ceFAZolin (ANCEF) IVPB 1 g/50 mL premix  Status:  Discontinued        1 g 100 mL/hr over 30 Minutes Intravenous To Radiology 09/29/22 1057 09/29/22 1059   09/29/22 1145  ceFAZolin (ANCEF) IVPB 2g/100 mL premix        2 g 200 mL/hr over 30 Minutes Intravenous  Once 09/29/22 1059 09/29/22 1159   09/14/22 1400  ceFEPIme (MAXIPIME)  2 g in sodium chloride 0.9 % 100 mL IVPB        2 g 200 mL/hr over 30 Minutes Intravenous Every 8 hours 09/14/22 1312 09/21/22 0922   09/10/22 0830  ceFAZolin (ANCEF) IVPB 1 g/50 mL premix  Status:  Discontinued        1 g 100 mL/hr over 30 Minutes Intravenous Every 8 hours 09/10/22 0738 09/10/22 0821   09/10/22 0830  ceFAZolin (ANCEF) IVPB 2g/100 mL premix  Status:  Discontinued        2 g 200 mL/hr over 30 Minutes Intravenous Every 8 hours 09/10/22 0821 09/14/22 1312   09/07/22 0815  ceFAZolin (ANCEF) IVPB 2g/100 mL premix        2 g 200 mL/hr over 30 Minutes Intravenous Every 8 hours 09/07/22 0807 09/08/22 1742   09/04/22 2200  Ampicillin-Sulbactam (UNASYN) 3 g in sodium chloride 0.9 % 100 mL IVPB  Status:  Discontinued        3 g 200 mL/hr over 30 Minutes Intravenous Every 24 hours 09/04/22 0722 09/04/22 1506   09/04/22 2200  ceFAZolin (ANCEF) IVPB 1 g/50 mL premix  Status:  Discontinued        1 g 100 mL/hr over 30 Minutes Intravenous Every 12 hours 09/04/22 1506 09/07/22 0807   09/02/22 1030  Ampicillin-Sulbactam (UNASYN) 3 g in sodium chloride 0.9 % 100 mL IVPB  Status:  Discontinued        3 g 200 mL/hr over 30 Minutes Intravenous Every 6 hours 09/02/22 1009 09/04/22 0722      Culture/Microbiology    Component Value Date/Time   SDES BRONCHIAL ALVEOLAR LAVAGE 09/19/2022 1909   SPECREQUEST NONE 09/19/2022 1909   CULT FEW ENTEROBACTER CLOACAE 09/19/2022 1909   REPTSTATUS 09/22/2022 FINAL 09/19/2022 1909    Other culture-see note  Radiology Studies: IR IVC FILTER PLMT / S&I Burke Keels GUID/MOD SED  Addendum Date: 09/29/2022   ADDENDUM REPORT: 09/29/2022 18:42 ADDENDUM: Correction: 17 gauge needle was directed into the stomach for placement of the initial T-fastener. Super stiff Amplatz wire was advanced through this needle and the 9 French vascular sheath was placed. T-fasteners can be removed in 7-10 days. Electronically Signed   By: Markus Daft M.D.   On: 09/29/2022 18:42    Result Date: 09/29/2022 INDICATION: 61 year old with an intracranial hemorrhage and history of lower extremity DVT. Request for gastrostomy tube. Request  for IVC filter due to history of DVTs and patient immobility. EXAM: IVC FILTER PLACEMENT; IVC VENOGRAM; ULTRASOUND FOR VASCULAR ACCESS PERCUTANEOUS GASTROSTOMY TUBE PLACEMENT Physician: Stephan Minister. Anselm Pancoast, MD MEDICATIONS: None. ANESTHESIA/SEDATION: Moderate (conscious) sedation was employed during this procedure. A total of Versed 2.0mg  and fentanyl 100 mcg was administered intravenously at the order of the provider performing the procedure. Total intra-service moderate sedation time: 76 minutes. Patient's level of consciousness and vital signs were monitored continuously by radiology nurse throughout the procedure under the supervision of the provider performing the procedure. CONTRAST:  50 mL Omnipaque 300 FLUOROSCOPY: Radiation Exposure Index (as provided by the fluoroscopic device): 123456 mGy Kerma COMPLICATIONS: None immediate. PROCEDURE: Informed consent was obtained for an IVC filter placement and gastrostomy tube placement. Ultrasound demonstrated a patent right common femoral vein. Ultrasound images were obtained for documentation. The right groin was prepped and draped in a sterile fashion. Maximal barrier sterile technique was utilized including caps, mask, sterile gowns, sterile gloves, sterile drape, hand hygiene and skin antiseptic. The skin was anesthetized with 1% lidocaine. A 21 gauge needle was directed into the right common femoral vein with ultrasound guidance and a micropuncture dilator set was placed. A wire was advanced into the IVC. The filter sheath was advanced over the wire into the IVC. An IVC venogram was performed. Fluoroscopic images were obtained for documentation. A Bard Denali filter was deployed below the lowest renal vein. A follow-up venogram was performed and the vascular sheath was removed with manual compression. Ultrasound was  used to identify the left hepatic lobe. The existing nasogastric tube was removed and a 5 [place] orogastric tube was placed with fluoroscopy. Anterior abdomen was prepped and draped in sterile fashion. Maximal barrier sterile technique was utilized including caps, mask, sterile gowns, sterile gloves, sterile drape, hand hygiene and skin antiseptic. Stomach was insufflated through the orogastric tube. Anterior abdomen was anesthetized using 1% lidocaine. 60 gauge needle was directed into the stomach using fluoroscopy and a T-fastener was deployed. 9 French vascular sheath was placed. Attempted to snare the orogastric tube through the 9 [place] access but this was unsuccessful. Therefore, 2 additional T-fasteners were placed in the using fluoroscopic guidance. The 9 French vascular sheath was dilated up to an 18 French peel-away sheath over a superstiff Amplatz wire. A 16 French Entuit gastrostomy tube was advanced over the wire through the peel-away sheath. The balloon was inflated with approximately 7 mL of saline. Contrast injection confirmed placement within the stomach. The gastrostomy tube was flushed with saline. FINDINGS: IVC was patent. Bilateral renal veins were identified. The filter was deployed below the lowest renal vein. Follow-up venogram confirmed placement within the IVC and below the renal veins. Gastrostomy tube is appropriately positioned in the stomach. IMPRESSION: Successful placement of a retrievable IVC filter. Successful placement of a percutaneous gastrostomy tube. PLAN: Due to patient related comorbidities and/or clinical necessity, this IVC filter should be considered a permanent device. This patient will not be actively followed for future filter retrieval. Electronically Signed: By: Markus Daft M.D. On: 09/29/2022 17:42   IR GASTROSTOMY TUBE MOD SED  Addendum Date: 09/29/2022   ADDENDUM REPORT: 09/29/2022 18:42 ADDENDUM: Correction: 17 gauge needle was directed into the stomach for  placement of the initial T-fastener. Super stiff Amplatz wire was advanced through this needle and the 9 French vascular sheath was placed. T-fasteners can be removed in 7-10 days. Electronically Signed   By: Markus Daft M.D.   On: 09/29/2022 18:42   Result Date:  09/29/2022 INDICATION: 61 year old with an intracranial hemorrhage and history of lower extremity DVT. Request for gastrostomy tube. Request for IVC filter due to history of DVTs and patient immobility. EXAM: IVC FILTER PLACEMENT; IVC VENOGRAM; ULTRASOUND FOR VASCULAR ACCESS PERCUTANEOUS GASTROSTOMY TUBE PLACEMENT Physician: Stephan Minister. Anselm Pancoast, MD MEDICATIONS: None. ANESTHESIA/SEDATION: Moderate (conscious) sedation was employed during this procedure. A total of Versed 2.0mg  and fentanyl 100 mcg was administered intravenously at the order of the provider performing the procedure. Total intra-service moderate sedation time: 76 minutes. Patient's level of consciousness and vital signs were monitored continuously by radiology nurse throughout the procedure under the supervision of the provider performing the procedure. CONTRAST:  50 mL Omnipaque 300 FLUOROSCOPY: Radiation Exposure Index (as provided by the fluoroscopic device): 123456 mGy Kerma COMPLICATIONS: None immediate. PROCEDURE: Informed consent was obtained for an IVC filter placement and gastrostomy tube placement. Ultrasound demonstrated a patent right common femoral vein. Ultrasound images were obtained for documentation. The right groin was prepped and draped in a sterile fashion. Maximal barrier sterile technique was utilized including caps, mask, sterile gowns, sterile gloves, sterile drape, hand hygiene and skin antiseptic. The skin was anesthetized with 1% lidocaine. A 21 gauge needle was directed into the right common femoral vein with ultrasound guidance and a micropuncture dilator set was placed. A wire was advanced into the IVC. The filter sheath was advanced over the wire into the IVC. An IVC  venogram was performed. Fluoroscopic images were obtained for documentation. A Bard Denali filter was deployed below the lowest renal vein. A follow-up venogram was performed and the vascular sheath was removed with manual compression. Ultrasound was used to identify the left hepatic lobe. The existing nasogastric tube was removed and a 5 [place] orogastric tube was placed with fluoroscopy. Anterior abdomen was prepped and draped in sterile fashion. Maximal barrier sterile technique was utilized including caps, mask, sterile gowns, sterile gloves, sterile drape, hand hygiene and skin antiseptic. Stomach was insufflated through the orogastric tube. Anterior abdomen was anesthetized using 1% lidocaine. 72 gauge needle was directed into the stomach using fluoroscopy and a T-fastener was deployed. 9 French vascular sheath was placed. Attempted to snare the orogastric tube through the 9 [place] access but this was unsuccessful. Therefore, 2 additional T-fasteners were placed in the using fluoroscopic guidance. The 9 French vascular sheath was dilated up to an 18 French peel-away sheath over a superstiff Amplatz wire. A 16 French Entuit gastrostomy tube was advanced over the wire through the peel-away sheath. The balloon was inflated with approximately 7 mL of saline. Contrast injection confirmed placement within the stomach. The gastrostomy tube was flushed with saline. FINDINGS: IVC was patent. Bilateral renal veins were identified. The filter was deployed below the lowest renal vein. Follow-up venogram confirmed placement within the IVC and below the renal veins. Gastrostomy tube is appropriately positioned in the stomach. IMPRESSION: Successful placement of a retrievable IVC filter. Successful placement of a percutaneous gastrostomy tube. PLAN: Due to patient related comorbidities and/or clinical necessity, this IVC filter should be considered a permanent device. This patient will not be actively followed for future  filter retrieval. Electronically Signed: By: Markus Daft M.D. On: 09/29/2022 17:42     LOS: 42 days   Antonieta Pert, MD [facility]  09/30/2022, 8:00 AM

## 2022-09-30 NOTE — Progress Notes (Signed)
Patient seen today by trach team for consult.  No education is needed at this time.  All necessary equipment is at beside.   Will continue to follow for progression.  

## 2022-10-01 DIAGNOSIS — Z515 Encounter for palliative care: Secondary | ICD-10-CM

## 2022-10-01 DIAGNOSIS — Z66 Do not resuscitate: Secondary | ICD-10-CM

## 2022-10-01 LAB — GLUCOSE, CAPILLARY
Glucose-Capillary: 117 mg/dL — ABNORMAL HIGH (ref 70–99)
Glucose-Capillary: 121 mg/dL — ABNORMAL HIGH (ref 70–99)
Glucose-Capillary: 143 mg/dL — ABNORMAL HIGH (ref 70–99)
Glucose-Capillary: 143 mg/dL — ABNORMAL HIGH (ref 70–99)
Glucose-Capillary: 145 mg/dL — ABNORMAL HIGH (ref 70–99)
Glucose-Capillary: 160 mg/dL — ABNORMAL HIGH (ref 70–99)

## 2022-10-01 NOTE — Progress Notes (Signed)
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  9/28 admitted- Intracranial Hemorrhage/ R MCA ruptured aneurysm s/p ct head, TTE LVEF of 60-65% 9/29 MRI brain:Large acute intraparenchymal hemorrhage- extension into the right-greater-than-left lateral ventricle, third ventricle, and fourth ventricle. 9/29-Fever and  pneumonia,MSSA in resp culture wbc up to 56k 9/29 self extubated, required reintubation 10/2 Family identified  10/15-transferred to the hospitalist service>transferred back to ICU for worsening respiratory status> Needing back to be on vent.  09/26/2022>> on 60% ATC, tolerating well. Sats 93% 09/29/22: IVC filter placed blue renal events and PEG tube placed by IR    Subjective: Seen and examined this morning Alert awake eyes open able to squeeze with right arm Moving right lower extremity on command Unable to move the left side On trach collar and PEG tube feeding. Overnight Tmax 100.1, BP stable   Assessment and Plan: Principal Problem:   ICH (intracerebral hemorrhage) (HCC) Active Problems:   Intracranial hemorrhage (HCC)   Pressure injury of skin   Acute respiratory failure with hypoxia (HCC)   Aspiration pneumonia of both lungs (HCC)   Hypernatremia   Tracheostomy in place (HCC)   Ventilator associated pneumonia (HCC)   Leukocytosis   Fever  Acute hypoxic respiratory failure 2/2 pneumonia Pneumonia due  to MSSA and Enterobacter S/P tracheostomy in place-on trach collar: Overall respiratory status stable on trach collar.Completed antibiotics.  Continue with trach management- pulmonary hygiene/respiratory care, suctioning,position changing. Pulmonary following intermittently.  RT following.  Acute toxic metabolic encephalopathy: Nonverbal, alert awake moving only his right side follows some command.left side flaccid.Continue supportive care, frequent position changing.  Dysphagia stroke:s/p PEG tube by IR 10/26> started on tube feeding, continue PPI  Bilateral DVTs: Not a candidate for anticoagulation due to [place], s/p IVC filter  09/29/22 by IR.  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  Blood pressure stable at goal  Hypertension: Well-controlled , cont Metoprolol 25 bid,lisinopril 40 daily, amlodipine 10 mg.  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 Patient will need follow-up with hematology. Recent Labs  Lab 09/25/22 0319 09/27/22 0554 09/29/22 0458 09/30/22 0542  WBC 42.5* 41.8* 41.9* 47.5*   AKI-ruled out Electrolyte imbalance with hyperkalemia hypernatremia resolved   Type 2 diabetes mellitus A1c 6.5: Blood sugar well controlled continue SSI while on tube feeding  Recent Labs  Lab 09/30/22 1607 09/30/22 2026 10/01/22 0003 10/01/22 0408 10/01/22 0806  GLUCAP 136* 150* 160* 145* 143*   Depression: now w/ encephalopathy. Thrombocytopenia: Monitor platelet count Nonsustained V. Tach: Monitor electrolytes Rectal tube in place: Continue supportive care continue feeding [id number] prognosis does not appear bright, palliative care following closely after a  conference call with family 10/28> CODE STATUS changed to DNAR do not place back on vent support  At risk of malnutrition with inadequate oral intake plan for feeding tube w/  PEG Nutrition Problem: Inadequate oral intake Etiology: inability  to eat Signs/Symptoms: NPO status Interventions: Prostat, Tube feeding   Morbid obesity:Patient's Body mass index is 41.99 kg/m.   Pressure injury:Pretibial right diStal stage 2 ZOX:WRUE WOUND CARE  DVT prophylaxis: heparin injection 5,000 Units Start: 09/03/22 1400 SCD's Start: 09/26/2022 0930 Code Status:   Code Status: Full Code Family Communication: plan of care discussed w/ staffs  Discussed with palliative care they have spoken and updated family today  Patient status is: Inpatient because of respiratory failure, encephalopathy Level of care: Progressive  Dispo: The patient is from: home            Anticipated disposition: TBD   Objective: Vitals last 24 hrs: Vitals:   10/01/22 0230 10/01/22 0410 10/01/22 0435 10/01/22 0731  BP:  (!) 143/97  (!) 137/92  Pulse: 77 79  80  Resp: 15 18  19   Temp:  100 F (37.8 C)  98.6 F (37 C)  TempSrc:  Axillary  Axillary  SpO2: 93% 93%  96%  Weight:   121.6 kg   Height:       Weight change: 0.6 kg  Physical Examination: General exam: Alert awake moving right side HEENT:Oral mucosa moist, Ear/Nose WNL grossly, dentition normal. Respiratory system: bilaterally clear BS, TC+,use of accessory muscle Cardiovascular system: S1 & S2 +, regular rate Gastrointestinal system: Abdomen soft, peg+NT,ND,BS+, Rectal tube+ Nervous System:Alert, awake, able to move Rt UE/RLE left-sided flaccid Extremities: LE ankle edema neg, lower extremities warm Skin: No rashes,no icterus. MSK: Normal muscle bulk,tone, power   Medications reviewed:  Scheduled Meds:  albuterol  2.5 mg Nebulization TID   amLODipine  10 mg Per Tube Daily   arformoterol  15 mcg Nebulization BID   feeding supplement (PROSource TF20)  60 mL Per Tube BID   fiber  1 packet Per Tube BID   free water  200 mL Per Tube Q6H   heparin injection (subcutaneous)  5,000 Units Subcutaneous Q8H   insulin aspart  0-15  Units Subcutaneous Q4H   iohexol  100 mL Oral Once   lisinopril  40 mg Per Tube Daily   metoprolol tartrate  25 mg Per Tube BID   mouth rinse  15 mL Mouth Rinse 4 times per day   pantoprazole  40 mg Per Tube Daily   revefenacin  175 mcg Nebulization Daily   Continuous Infusions:   ceFAZolin (ANCEF) IV     feeding supplement (JEVITY 1.5 CAL/FIBER) 1,000 mL (09/30/22 1207)   Diet Order             Diet NPO time specified Except for: Ice Chips  Diet effective now                  Unresulted Labs (From admission, onward)     Start     Ordered   10/02/22 4540  Basic metabolic panel  Tomorrow morning,   R       Question:  Specimen collection method  Answer:  Lab=Lab collect   10/01/22 0807   10/02/22 0500  CBC  Tomorrow morning,   R       Question:  Specimen collection method  Answer:  Lab=Lab collect   10/01/22 0807          Data Reviewed: I have personally reviewed following labs and imaging studies CBC: Recent Labs  Lab 09/25/22 0319 09/27/22 0554 09/29/22 0458 09/30/22 0542  WBC 42.5* 41.8* 41.9* 47.5*  NEUTROABS 6.3  --   --   --   HGB 12.6* 11.9* 13.1  12.8*  HCT 40.3 36.8* 40.1 39.5  MCV 94.8 94.1 92.4 93.4  PLT 176 177 182 [id number]    Basic Metabolic Panel: Recent Labs  Lab 09/25/22 0319 09/27/22 0554 09/28/22 0538 09/29/22 0458  NA 141 139 139 139  K 4.5 4.4 3.9 4.4  CL 107 103 99 101  CO2 26 28 29 29   GLUCOSE 114* 146* 129* 126*  BUN 54* 56* 51* 42*  CREATININE 1.18 1.31* 1.05 1.00  CALCIUM 9.6 9.7 10.0 9.9  MG 2.1 2.0  --   --   PHOS 4.4  --  4.1  --    GFR: Estimated Creatinine Clearance: 96.9 mL/min (by C-G formula based on SCr of 1 mg/dL). Liver Function Tests: Recent Labs  Lab 09/28/22 0538 09/29/22 0458  AST  --  29  ALT  --  40  ALKPHOS  --  72  BILITOT  --  0.6  PROT  --  7.5  ALBUMIN 3.0* 3.1*   Recent Labs  Lab 09/30/22 1607 09/30/22 2026 10/01/22 0003 10/01/22 0408 10/01/22 0806  GLUCAP 136* 150* 160* 145* 143*    Antimicrobials: Anti-infectives (From admission, onward)    Start     Dose/Rate Route Frequency Ordered Stop   09/29/22 1445  ceFAZolin (ANCEF) IVPB 2g/100 mL premix        2 g 200 mL/hr over 30 Minutes Intravenous  Once 09/29/22 1346     09/29/22 1145  ceFAZolin (ANCEF) IVPB 1 g/50 mL premix  Status:  Discontinued        1 g 100 mL/hr over 30 Minutes Intravenous To Radiology 09/29/22 1057 09/29/22 1059   09/29/22 1145  ceFAZolin (ANCEF) IVPB 2g/100 mL premix        2 g 200 mL/hr over 30 Minutes Intravenous  Once 09/29/22 1059 09/29/22 1159   09/14/22 1400  ceFEPIme (MAXIPIME) 2 g in sodium chloride 0.9 % 100 mL IVPB        2 g 200 mL/hr over 30 Minutes Intravenous Every 8 hours 09/14/22 1312 09/21/22 0922   09/10/22 0830  ceFAZolin (ANCEF) IVPB 1 g/50 mL premix  Status:  Discontinued        1 g 100 mL/hr over 30 Minutes Intravenous Every 8 hours 09/10/22 0738 09/10/22 0821   09/10/22 0830  ceFAZolin (ANCEF) IVPB 2g/100 mL premix  Status:  Discontinued        2 g 200 mL/hr over 30 Minutes Intravenous Every 8 hours 09/10/22 0821 09/14/22 1312   09/07/22 0815  ceFAZolin (ANCEF) IVPB 2g/100 mL premix        2 g 200 mL/hr over 30 Minutes Intravenous Every 8 hours 09/07/22 0807 09/08/22 1742   09/04/22 2200  Ampicillin-Sulbactam (UNASYN) 3 g in sodium chloride 0.9 % 100 mL IVPB  Status:  Discontinued        3 g 200 mL/hr over 30 Minutes Intravenous Every 24 hours 09/04/22 0722 09/04/22 1506   09/04/22 2200  ceFAZolin (ANCEF) IVPB 1 g/50 mL premix  Status:  Discontinued        1 g 100 mL/hr over 30 Minutes Intravenous Every 12 hours 09/04/22 1506 09/07/22 0807   09/02/22 1030  Ampicillin-Sulbactam (UNASYN) 3 g in sodium chloride 0.9 % 100 mL IVPB  Status:  Discontinued        3 g 200 mL/hr over 30 Minutes Intravenous Every 6 hours 09/02/22 1009 09/04/22 0722      Culture/Microbiology    Component Value Date/Time   SDES BRONCHIAL ALVEOLAR  LAVAGE 09/19/2022 1909   SPECREQUEST  NONE 09/19/2022 1909   CULT FEW ENTEROBACTER CLOACAE 09/19/2022 1909   REPTSTATUS 09/22/2022 FINAL 09/19/2022 1909    Other culture-see note  Radiology Studies: IR IVC FILTER PLMT / S&I Burke Keels GUID/MOD SED  Addendum Date: 09/29/2022   ADDENDUM REPORT: 09/29/2022 18:42 ADDENDUM: Correction: 29 gauge needle was directed into the stomach for placement of the initial T-fastener. Super stiff Amplatz wire was advanced through this needle and the 9 French vascular sheath was placed. T-fasteners can be removed in 7-10 days. Electronically Signed   By: Markus Daft M.D.   On: 09/29/2022 18:42   Result Date: 09/29/2022 INDICATION: 61 year old with an intracranial hemorrhage and history of lower extremity DVT. Request for gastrostomy tube. Request for IVC filter due to history of DVTs and patient immobility. EXAM: IVC FILTER PLACEMENT; IVC VENOGRAM; ULTRASOUND FOR VASCULAR ACCESS PERCUTANEOUS GASTROSTOMY TUBE PLACEMENT Physician: Stephan Minister. Anselm Pancoast, MD MEDICATIONS: None. ANESTHESIA/SEDATION: Moderate (conscious) sedation was employed during this procedure. A total of Versed 2.0mg  and fentanyl 100 mcg was administered intravenously at the order of the provider performing the procedure. Total intra-service moderate sedation time: 76 minutes. Patient's level of consciousness and vital signs were monitored continuously by radiology nurse throughout the procedure under the supervision of the provider performing the procedure. CONTRAST:  50 mL Omnipaque 300 FLUOROSCOPY: Radiation Exposure Index (as provided by the fluoroscopic device): 123456 mGy Kerma COMPLICATIONS: None immediate. PROCEDURE: Informed consent was obtained for an IVC filter placement and gastrostomy tube placement. Ultrasound demonstrated a patent right common femoral vein. Ultrasound images were obtained for documentation. The right groin was prepped and draped in a sterile fashion. Maximal barrier sterile technique was utilized including caps, mask, sterile gowns,  sterile gloves, sterile drape, hand hygiene and skin antiseptic. The skin was anesthetized with 1% lidocaine. A 21 gauge needle was directed into the right common femoral vein with ultrasound guidance and a micropuncture dilator set was placed. A wire was advanced into the IVC. The filter sheath was advanced over the wire into the IVC. An IVC venogram was performed. Fluoroscopic images were obtained for documentation. A Bard Denali filter was deployed below the lowest renal vein. A follow-up venogram was performed and the vascular sheath was removed with manual compression. Ultrasound was used to identify the left hepatic lobe. The existing nasogastric tube was removed and a 5 [place] orogastric tube was placed with fluoroscopy. Anterior abdomen was prepped and draped in sterile fashion. Maximal barrier sterile technique was utilized including caps, mask, sterile gowns, sterile gloves, sterile drape, hand hygiene and skin antiseptic. Stomach was insufflated through the orogastric tube. Anterior abdomen was anesthetized using 1% lidocaine. 73 gauge needle was directed into the stomach using fluoroscopy and a T-fastener was deployed. 9 French vascular sheath was placed. Attempted to snare the orogastric tube through the 9 [place] access but this was unsuccessful. Therefore, 2 additional T-fasteners were placed in the using fluoroscopic guidance. The 9 French vascular sheath was dilated up to an 18 French peel-away sheath over a superstiff Amplatz wire. A 16 French Entuit gastrostomy tube was advanced over the wire through the peel-away sheath. The balloon was inflated with approximately 7 mL of saline. Contrast injection confirmed placement within the stomach. The gastrostomy tube was flushed with saline. FINDINGS: IVC was patent. Bilateral renal veins were identified. The filter was deployed below the lowest renal vein. Follow-up venogram confirmed placement within the IVC and below the renal veins. Gastrostomy tube is  appropriately positioned in the stomach.  IMPRESSION: Successful placement of a retrievable IVC filter. Successful placement of a percutaneous gastrostomy tube. PLAN: Due to patient related comorbidities and/or clinical necessity, this IVC filter should be considered a permanent device. This patient will not be actively followed for future filter retrieval. Electronically Signed: By: Markus Daft M.D. On: 09/29/2022 17:42   IR GASTROSTOMY TUBE MOD SED  Addendum Date: 09/29/2022   ADDENDUM REPORT: 09/29/2022 18:42 ADDENDUM: Correction: 17 gauge needle was directed into the stomach for placement of the initial T-fastener. Super stiff Amplatz wire was advanced through this needle and the 9 French vascular sheath was placed. T-fasteners can be removed in 7-10 days. Electronically Signed   By: Markus Daft M.D.   On: 09/29/2022 18:42   Result Date: 09/29/2022 INDICATION: 61 year old with an intracranial hemorrhage and history of lower extremity DVT. Request for gastrostomy tube. Request for IVC filter due to history of DVTs and patient immobility. EXAM: IVC FILTER PLACEMENT; IVC VENOGRAM; ULTRASOUND FOR VASCULAR ACCESS PERCUTANEOUS GASTROSTOMY TUBE PLACEMENT Physician: Stephan Minister. Anselm Pancoast, MD MEDICATIONS: None. ANESTHESIA/SEDATION: Moderate (conscious) sedation was employed during this procedure. A total of Versed 2.0mg  and fentanyl 100 mcg was administered intravenously at the order of the provider performing the procedure. Total intra-service moderate sedation time: 76 minutes. Patient's level of consciousness and vital signs were monitored continuously by radiology nurse throughout the procedure under the supervision of the provider performing the procedure. CONTRAST:  50 mL Omnipaque 300 FLUOROSCOPY: Radiation Exposure Index (as provided by the fluoroscopic device): 123456 mGy Kerma COMPLICATIONS: None immediate. PROCEDURE: Informed consent was obtained for an IVC filter placement and gastrostomy tube placement. Ultrasound  demonstrated a patent right common femoral vein. Ultrasound images were obtained for documentation. The right groin was prepped and draped in a sterile fashion. Maximal barrier sterile technique was utilized including caps, mask, sterile gowns, sterile gloves, sterile drape, hand hygiene and skin antiseptic. The skin was anesthetized with 1% lidocaine. A 21 gauge needle was directed into the right common femoral vein with ultrasound guidance and a micropuncture dilator set was placed. A wire was advanced into the IVC. The filter sheath was advanced over the wire into the IVC. An IVC venogram was performed. Fluoroscopic images were obtained for documentation. A Bard Denali filter was deployed below the lowest renal vein. A follow-up venogram was performed and the vascular sheath was removed with manual compression. Ultrasound was used to identify the left hepatic lobe. The existing nasogastric tube was removed and a 5 [place] orogastric tube was placed with fluoroscopy. Anterior abdomen was prepped and draped in sterile fashion. Maximal barrier sterile technique was utilized including caps, mask, sterile gowns, sterile gloves, sterile drape, hand hygiene and skin antiseptic. Stomach was insufflated through the orogastric tube. Anterior abdomen was anesthetized using 1% lidocaine. 4 gauge needle was directed into the stomach using fluoroscopy and a T-fastener was deployed. 9 French vascular sheath was placed. Attempted to snare the orogastric tube through the 9 [place] access but this was unsuccessful. Therefore, 2 additional T-fasteners were placed in the using fluoroscopic guidance. The 9 French vascular sheath was dilated up to an 18 French peel-away sheath over a superstiff Amplatz wire. A 16 French Entuit gastrostomy tube was advanced over the wire through the peel-away sheath. The balloon was inflated with approximately 7 mL of saline. Contrast injection confirmed placement within the stomach. The gastrostomy  tube was flushed with saline. FINDINGS: IVC was patent. Bilateral renal veins were identified. The filter was deployed below the lowest renal vein. Follow-up venogram  confirmed placement within the IVC and below the renal veins. Gastrostomy tube is appropriately positioned in the stomach. IMPRESSION: Successful placement of a retrievable IVC filter. Successful placement of a percutaneous gastrostomy tube. PLAN: Due to patient related comorbidities and/or clinical necessity, this IVC filter should be considered a permanent device. This patient will not be actively followed for future filter retrieval. Electronically Signed: By: Markus Daft M.D. On: 09/29/2022 17:42     LOS: 30 days   Antonieta Pert, MD [facility]  10/01/2022, 8:07 AM

## 2022-10-01 NOTE — Progress Notes (Signed)
Palliative Medicine Inpatient Follow Up Note HPI: 61 y.o. 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 Recovery complicated by respiratory failure, pneumonia. Has trach, currently on trach collar 60% 10L. Poor mentation, following simple commands intermittently. Cortrak in place. SLP re-eval pending. Palliative medicine consulted for above.    Today's Discussion 10/01/2022  *Please note that this is a verbal dictation therefore any spelling or grammatical errors are due to the "[place] [employer]" system interpretation.  Chart reviewed inclusive of vital signs, progress notes, laboratory results, and diagnostic images.   I spoke on conference call with patients ex-wife, Lattie Haw, brother, Roselie Awkward, daughter, Delana Meyer, and son, Avary this morning.   We reviewed in detail Lafayette's complicated thirty day hospital course in the setting of his stroke/ICH and PNA leading to respiratory failure. WE discussed that he has endured a great deal and recovery for him is seeming to be less likely. Reviewed that his future will likely be that of someone dependent on 24/7  care in a facility. Reviewed that he will continue to decondition and be susceptible to pressure ulcers, additional pneumonias, and nosocomial infections. I shared that this will likely continue and result in more potential discomfort for Elzia.   We discussed patients present code status and focused on if [place] - being all that he has already endure would desire additional resuscitation efforts to be made. Encouraged family to consider DNAR status understanding evidenced based poor outcomes in similar hospitalized patient, as the cause of arrest is likely associated with advanced chronic/terminal illness rather than an easily reversible acute cardio-pulmonary event. I explained that DNAR does not change the medical plan and it only comes into effect after a  person has arrested (died).  It is a protective measure to keep [place] from harming the patient in their last moments of life.  Patients family are all in agreement with this and vocalize not desiring for the patient to go back on the ventilator if additional decompensation occurs.   We further reviewed the idea of what quality of life is to [place] - his family all attest to him being a greatly independent man. He was still quite functional prior to admission. He was living in an apartment which had been arranged for him during the pandemic. He at times in his life has been homeless though presently is not. He was married to Kerkhoven for a number of years and they have remained close friends post-divorce. They recently went to the casino together. Patients family share that Castin is a wildly stubborn and independent man. They do not feel he would desire living a life dependent on other. His son, Floyd Wade. Feels that he has fought and is not suffering. He vocalizes that every time he see's him he feels that he has lost his fight.   We reviewed either continuing the present course or changing our focus to keeping C[place] comfortable.We talked about transition to comfort measures in house and what that would entail inclusive of medications to control pain, dyspnea, agitation, nausea, itching, and hiccups.  We discussed stopping all uneccessary measures such as artificial nutrition, cardiac monitoring, blood draws, needle sticks, and frequent vital signs. Utilized reflective listening throughout our time together as this idea what a lot for family to process per their vocalization.   Created space and opportunity for family to explore thoughts feelings and fears regarding current medical situation.They are clear that [place] would not  want to live like this though they would appreciate more time to further desire they direction they should travel. I shared that they should visit with Clennon and speak as a family to  help guide future decisions. We reviewed if something acute occurs in the meanwhile it may push the need for a decision sooner than later. Emphasized the importance of considering what quality of life is appropriate to [place] with any and all decisions.   Questions and concerns addressed/Palliative Support Provided.   Objective Assessment: Vital Signs Vitals:   10/01/22 0230 10/01/22 0410  BP:  (!) 143/97  Pulse: 77 79  Resp: 15 18  Temp:  100 F (37.8 C)  SpO2: 93% 93%    Intake/Output Summary (Last 24 hours) at 10/01/2022 [id number] Last data filed at 09/30/2022 1613 Gross per 24 hour  Intake --  Output 800 ml  Net -800 ml   Last Weight  Most recent update: 10/01/2022  4:35 AM    Weight  121.6 kg (268 lb 1.3 oz)            Gen: Older AA M in NAD HEENT: moist mucous membranes, Tracheostomy CV: Regular rate and rhythm  PULM:  On trach collar, (+) secretions, (+) rhonchi ABD: soft/nontender/(+) G-Tube [id number] edema  Neuro: Opens eyes, lifts right arm   SUMMARY OF RECOMMENDATIONS   DNAR - do not place back on vent support  Candid conversation with Ladonte's family about his health state and what the long term will likely look like --> Recurrence of PNA, pressure injuries, additional nosocomial infections  Patients family would like to take time to absorb conversation --> Reviewed the idea of continuing present care or stoppiong artificial means of support and focusing on comfort  PMT will continue to follow along  Time Spent: 74  Billing based on MDM: High Problems Addressed: One acute or chronic illness or injury that poses a threat to life or bodily function Amount and/or Complexity of Data: Category 3:Discussion of management or test interpretation with external physician/other qualified health care professional/appropriate source (not separately reported) Risks: Decision regarding hospitalization or escalation of hospital care and Decision not to resuscitate  or to de-escalate care because of poor prognosis ______________________________________________________________________________________ [place] [facility] Team Team Cell Phone: [phone] Please utilize secure chat with additional questions, if there is no response within 30 minutes please call the above phone number  Palliative Medicine Team providers are available by phone from 7am to 7pm daily and can be reached through the team cell phone.  Should this patient require assistance outside of these hours, please call the patient's attending physician.

## 2022-10-02 LAB — BASIC METABOLIC PANEL
Anion gap: 12 (ref 5–15)
BUN: 28 mg/dL — ABNORMAL HIGH (ref 8–23)
CO2: 27 mmol/L (ref 22–32)
Calcium: 9.5 mg/dL (ref 8.9–10.3)
Chloride: 104 mmol/L (ref 98–111)
Creatinine, Ser: 0.87 mg/dL (ref 0.61–1.24)
GFR, Estimated: >60 mL/min (ref >60)
Glucose, Bld: 143 mg/dL — ABNORMAL HIGH (ref 70–99)
Potassium: 4.1 mmol/L (ref 3.5–5.1)
Sodium: 143 mmol/L (ref 135–145)

## 2022-10-02 LAB — GLUCOSE, CAPILLARY
Glucose-Capillary: 122 mg/dL — ABNORMAL HIGH (ref 70–99)
Glucose-Capillary: 126 mg/dL — ABNORMAL HIGH (ref 70–99)
Glucose-Capillary: 127 mg/dL — ABNORMAL HIGH (ref 70–99)
Glucose-Capillary: 136 mg/dL — ABNORMAL HIGH (ref 70–99)
Glucose-Capillary: 137 mg/dL — ABNORMAL HIGH (ref 70–99)
Glucose-Capillary: 137 mg/dL — ABNORMAL HIGH (ref 70–99)
Glucose-Capillary: 138 mg/dL — ABNORMAL HIGH (ref 70–99)
Glucose-Capillary: 144 mg/dL — ABNORMAL HIGH (ref 70–99)

## 2022-10-02 LAB — CBC
HCT: 39.5 % (ref 39.0–52.0)
Hemoglobin: 12.8 g/dL — ABNORMAL LOW (ref 13.0–17.0)
MCH: 30.3 pg (ref 26.0–34.0)
MCHC: 32.4 g/dL (ref 30.0–36.0)
MCV: 93.4 fL (ref 80.0–100.0)
Platelets: 165 10*3/uL (ref 150–400)
RBC: 4.23 MIL/uL (ref 4.22–5.81)
RDW: 12.8 % (ref 11.5–15.5)
WBC: 41 10*3/uL — ABNORMAL HIGH (ref 4.0–10.5)
nRBC: 0.1 % (ref 0.0–0.2)

## 2022-10-02 NOTE — Progress Notes (Addendum)
   Palliative Medicine Inpatient Follow Up Note HPI: 61 y.o. 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 Recovery complicated by respiratory failure, pneumonia. Has trach, currently on trach collar 60% 10L. Poor mentation, following simple commands intermittently. Cortrak in place. SLP re-eval pending. Palliative medicine consulted for above.    Today's Discussion 10/02/2022  *Please note that this is a verbal dictation therefore any spelling or grammatical errors are due to the "[place] [employer]" system interpretation.  Chart reviewed inclusive of vital signs, progress notes, laboratory results, and diagnostic images.   I met with Dustin Jones at bedside this morning. He was resting and appeared to be in no distress. He can follow the command of lifting his arm though not blinking for me.   I called patients daughter, Dustin Jones and son, Dustin Jones. To follow up on our conversation from yesterday. [name] was on a flight therefore have very poor reception though denied questions at that time. Patients son, Dustin Jones is having trouble with transportation therefore has not been able to see the patient since our conversation - he continues to absorb the context of the conversations. Plan to continue current measures for the time being.    Questions and concerns addressed/Palliative Support Provided.   Objective Assessment: Vital Signs Vitals:   10/02/22 0735 10/02/22 0738  BP: (!) 146/94   Pulse: 89   Resp: 19   Temp: 98.3 F (36.8 C)   SpO2: 95% 94%    Intake/Output Summary (Last 24 hours) at 10/02/2022 1053 Last data filed at 10/02/2022 0604 Gross per 24 hour  Intake --  Output 1700 ml  Net -1700 ml    Last Weight  Most recent update: 10/02/2022  5:31 AM    Weight  120.1 kg (264 lb 12.4 oz)            Gen: Older AA M in NAD HEENT: moist mucous membranes, Tracheostomy CV: Regular rate and rhythm   PULM:  On trach collar, (+) secretions, (+) rhonchi ABD: soft/nontender/(+) G-Tube [id number] edema  Neuro: Opens eyes, lifts right arm   SUMMARY OF RECOMMENDATIONS   DNAR --> do not place back on vent support  Candid conversation with Dustin Jones's family about his health state and what the long term will likely look like --> Recurrence of PNA, pressure injuries, additional nosocomial infections  Patients family would like to take time to absorb conversation --> Reviewed the idea of continuing present care or stopping artificial means of support and focusing on comfort  PMT will continue to follow along --> I will not be present tomorrow though my colleague, Mariana Kaufman will resume care ______________________________________________________________________________________ [place] Team Team Cell Phone: [phone] Please utilize secure chat with additional questions, if there is no response within 30 minutes please call the above phone number  Palliative Medicine Team providers are available by phone from 7am to 7pm daily and can be reached through the team cell phone.  Should this patient require assistance outside of these hours, please call the patient's attending physician.

## 2022-10-02 NOTE — Progress Notes (Signed)
PROGRESS NOTE Dustin Jones  [id number] DOB: [birth year] DOA: 08/09/2022 PCP: Marliss Coots, NP   Brief Narrative/Hospital Course: 61 year old male with history of hypertension, history of DVT admitted on 9/28  as CODE STROKE, admitted in ICU with working diagnosis of intracranial hemorrhage right MCA ruptured aneurysm, has had prolonged hospitalization including multiple procedures work-up including R ICA coil embolization of right R ICA terminus aneurysm on 08/23/2022, tracheostomy by CCM on 10/10 and transferred to [facility] on 10/22.    9/28 admitted- Intracranial Hemorrhage/ R MCA ruptured aneurysm s/p ct head, TTE LVEF of 60-65% 9/29 MRI brain:Large acute intraparenchymal hemorrhage- extension into the right-greater-than-left lateral ventricle, third ventricle, and fourth ventricle. 9/29-Fever and  pneumonia,MSSA in resp culture wbc up to [facility] 9/29 self extubated, required reintubation 10/15-transferred to the hospitalist service>transferred back to ICU for worsening respiratory status> Needing back to be on vent 09/29/22: IVC filter and PEG tube placed by IR      Subjective: Seen and examined at bedside.  Noted to be arousable, responds to voice.   Assessment and Plan: Principal Problem:   ICH (intracerebral hemorrhage) (HCC) Active Problems:   Intracranial hemorrhage (HCC)   Pressure injury of skin   Acute respiratory failure with hypoxia (HCC)   Aspiration pneumonia of both lungs (HCC)   Hypernatremia   Tracheostomy in place (HCC)   Ventilator associated pneumonia (HCC)   Leukocytosis   Fever    Acute hypoxic respiratory failure 2/2 pneumonia Pneumonia due to MSSA and Enterobacter S/P tracheostomy in place-on trach collar: Overall respiratory status stable on trach collar.Completed antibiotics.  Continue with trach management- pulmonary hygiene/respiratory care, suctioning,position changing. Pulmonary following intermittently.  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  Blood pressure stable at goal  Acute toxic metabolic encephalopathy Nonverbal Continue supportive care, frequent position changing  Dysphagia s/p PEG tube by IR 10/26> continue tube feeding, continue PPI  Bilateral DVTs Not a candidate for anticoagulation due to [place], s/p IVC filter on 09/29/22 by IR  Hypertension Stable Cont Metoprolol 25 bid,lisinopril 40 daily, amlodipine 10 mg.  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 Patient will need follow-up with hematology  Type 2 diabetes mellitus  A1c 6.5 Continue SSI while on tube feeding    Depression Thrombocytopenia: Monitor platelet count Nonsustained V. Tach: Monitor electrolytes Rectal tube in place: Continue supportive care [id number] poor prognosis. Palliative care following closely. After a conference call with family 10/28> CODE STATUS changed to DNR/DNI. Do not place back on vent support  At risk of malnutrition with inadequate oral intake plan for feeding tube w/ PEG Nutrition Problem: Inadequate oral intake Etiology: inability to eat Signs/Symptoms: NPO status Interventions: Prostat, Tube feeding   Morbid obesity:Patient's Body mass index is 41.47 kg/m.   Pressure injury:Pretibial right distal stage 2 [id number] WOUND CARE  DVT prophylaxis: heparin injection 5,000 Units Start: 09/03/22 1400 SCD's Start: 08/30/2022 0930 Code Status:   Code Status: DNR Family Communication: None at bedside    Patient status is: Inpatient because of respiratory failure, encephalopathy Level of care: Progressive  Dispo: The patient is from: home            Anticipated disposition: TBD   Objective: Vitals last 24 hrs: Vitals:   10/02/22 0735 10/02/22 0738 10/02/22 1120 10/02/22 1129  BP: (!) 146/94   129/73  Pulse: 89   87  Resp:  19   15  Temp: 98.3 F (36.8 C)    99.4 F (37.4 C)  TempSrc: Axillary   Axillary  SpO2: 95% 94% 95% 95%  Weight:      Height:       Weight change: -1.5 kg  Physical Examination: General: NAD, awake Cardiovascular: S1, S2 present Respiratory: CTAB Abdomen: Soft, nontender, nondistended, bowel sounds present, PEG tube, rectal tube Musculoskeletal: No bilateral pedal edema noted Skin: Normal Psychiatry: Unable to assess   Medications reviewed:  Scheduled Meds:  amLODipine  10 mg Per Tube Daily   arformoterol  15 mcg Nebulization BID   feeding supplement (PROSource TF20)  60 mL Per Tube BID   fiber  1 packet Per Tube BID   free water  200 mL Per Tube Q6H   heparin injection (subcutaneous)  5,000 Units Subcutaneous Q8H   insulin aspart  0-15 Units Subcutaneous Q4H   iohexol  100 mL Oral Once   lisinopril  40 mg Per Tube Daily   metoprolol tartrate  25 mg Per Tube BID   mouth rinse  15 mL Mouth Rinse 4 times per day   pantoprazole  40 mg Per Tube Daily   revefenacin  175 mcg Nebulization Daily   Continuous Infusions:   ceFAZolin (ANCEF) IV     feeding supplement (JEVITY 1.5 CAL/FIBER) 1,000 mL (10/02/22 [id number])   Diet Order             Diet NPO time specified Except for: Ice Chips  Diet effective now                  Unresulted Labs (From admission, onward)    None     Data Reviewed: I have personally reviewed following labs and imaging studies CBC: Recent Labs  Lab 09/27/22 0554 09/29/22 0458 09/30/22 0542 10/02/22 0327  WBC 41.8* 41.9* 47.5* 41.0*  HGB 11.9* 13.1 12.8* 12.8*  HCT 36.8* 40.1 39.5 39.5  MCV 94.1 92.4 93.4 93.4  PLT 177 182 218 [id number]   Basic Metabolic Panel: Recent Labs  Lab 09/27/22 0554 09/28/22 0538 09/29/22 0458 10/02/22 0327  NA 139 139 139 143  K 4.4 3.9 4.4 4.1  CL 103 99 101 104  CO2 28 29 29 27   GLUCOSE 146* 129* 126* 143*  BUN 56* 51* 42* 28*  CREATININE 1.31* 1.05 1.00 0.87  CALCIUM 9.7 10.0 9.9 9.5  MG 2.0  --   --   --   PHOS  --  4.1  --   --    GFR: Estimated Creatinine Clearance: 110.6 mL/min (by C-G formula based on SCr of 0.87 mg/dL). Liver Function Tests: Recent Labs  Lab 09/28/22 0538 09/29/22 0458  AST  --  29  ALT  --  40  ALKPHOS  --  72  BILITOT  --  0.6  PROT  --  7.5  ALBUMIN 3.0* 3.1*   Recent Labs  Lab 10/01/22 2024 10/02/22 0050 10/02/22 0351 10/02/22 0738 10/02/22 1133  GLUCAP 143* 127* 138* 137* 126*  Antimicrobials: Anti-infectives (From admission, onward)    Start     Dose/Rate Route Frequency Ordered Stop   09/29/22 1445  ceFAZolin (ANCEF) IVPB 2g/100 mL premix        2 g 200 mL/hr over 30 Minutes Intravenous  Once 09/29/22 1346     09/29/22 1145  ceFAZolin (ANCEF) IVPB 1 g/50 mL premix  Status:  Discontinued        1 g 100 mL/hr over 30  Minutes Intravenous To Radiology 09/29/22 1057 09/29/22 1059   09/29/22 1145  ceFAZolin (ANCEF) IVPB 2g/100 mL premix        2 g 200 mL/hr over 30 Minutes Intravenous  Once 09/29/22 1059 09/29/22 1159   09/14/22 1400  ceFEPIme (MAXIPIME) 2 g in sodium chloride 0.9 % 100 mL IVPB        2 g 200 mL/hr over 30 Minutes Intravenous Every 8 hours 09/14/22 1312 09/21/22 0922   09/10/22 0830  ceFAZolin (ANCEF) IVPB 1 g/50 mL premix  Status:  Discontinued        1 g 100 mL/hr over 30 Minutes Intravenous Every 8 hours 09/10/22 0738 09/10/22 0821   09/10/22 0830  ceFAZolin (ANCEF) IVPB 2g/100 mL premix  Status:  Discontinued        2 g 200 mL/hr over 30 Minutes Intravenous Every 8 hours 09/10/22 0821 09/14/22 1312   09/07/22 0815  ceFAZolin (ANCEF) IVPB 2g/100 mL premix        2 g 200 mL/hr over 30 Minutes Intravenous Every 8 hours 09/07/22 0807 09/08/22 1742   09/04/22 2200  Ampicillin-Sulbactam (UNASYN) 3 g in sodium chloride 0.9 % 100 mL IVPB  Status:  Discontinued        3 g 200 mL/hr over 30 Minutes Intravenous Every 24 hours 09/04/22 0722 09/04/22 1506   09/04/22 2200  ceFAZolin (ANCEF) IVPB 1 g/50 mL premix  Status:  Discontinued        1 g 100 mL/hr over  30 Minutes Intravenous Every 12 hours 09/04/22 1506 09/07/22 0807   09/02/22 1030  Ampicillin-Sulbactam (UNASYN) 3 g in sodium chloride 0.9 % 100 mL IVPB  Status:  Discontinued        3 g 200 mL/hr over 30 Minutes Intravenous Every 6 hours 09/02/22 1009 09/04/22 0722      Culture/Microbiology    Component Value Date/Time   SDES BRONCHIAL ALVEOLAR LAVAGE 09/19/2022 1909   SPECREQUEST NONE 09/19/2022 1909   CULT FEW ENTEROBACTER CLOACAE 09/19/2022 1909   REPTSTATUS 09/22/2022 FINAL 09/19/2022 1909     Radiology Studies: No results found.   LOS: 31 days   Alma Friendly, MD [facility]  10/02/2022, 2:48 PM

## 2022-10-03 LAB — GLUCOSE, CAPILLARY
Glucose-Capillary: 123 mg/dL — ABNORMAL HIGH (ref 70–99)
Glucose-Capillary: 133 mg/dL — ABNORMAL HIGH (ref 70–99)

## 2022-10-03 NOTE — Progress Notes (Signed)
NAME:  Dustin Jones, MRN:  [id number], DOB:  09-18-1961, LOS: 72 ADMISSION DATE:  09-27-2022, CONSULTATION DATE:  09-27-22 REFERRING MD:  Maryan Rued, EDP, CHIEF COMPLAINT:  ICH   History of Present Illness:  61 year old man who presented to [facility] 9/28 as a Code Stroke. PMHx significant for HTN, DVT and chronic tobacco use.   Admitted with a right intraparenchymal hemorrhage in the setting of right ICA aneurysm status post coiling.  Course complicated by recurrent pneumonia and respiratory failure requiring tracheostomy He was transferred to the floor 10/15 but transfer back due to respiratory distress on 10/16  Pertinent  Medical History  HTN, tobacco use, and DVT   Significant Hospital Events: Including procedures, antibiotic start and stop dates in addition to other pertinent events   9/28 admitted. Intracranial Hemorrhage/ R MCA ruptured aneurysm Non-con CT of head demonstrating large acute hemorrhage in the R lentiform nucleus with with intraventricular extension, midline shift, and slight rounding of the temporal horns, possibly representing early hydrocephalus. On CTA, pt found to have a R MCA ruptured aneurysm. Neurosurgery consulted and following with plans for possible arterial clipping this evening. CT of the face and cervical spine did not show any acute traumatic injuries, though BL cervical lymphadenopathy was noted. Echo with normal biventricular function without evidence of hemodynamically sig valvular heart disease.  9/28 complete echo with LVEF of 60-65%, no regional wall motion abnormalities but with grade 1 diastolic dysfunction.  Normal right ventricular systolic function.  No valvular abnormalities. 9/29 MRI brain 1. Large acute intraparenchymal hemorrhage centered in the rightlentiform nucleus, with extension into the right-greater-than-left lateral ventricle, third ventricle, and fourth ventricle, overall similar to the prior CT. Mild mass effect and 5 mm of right-to-left  midline shift. No hydrocephalus. 2. Small areas of acute infarct in the bilateral occipital lobes, bilateral frontal lobes, and right parietal lobe. Spiking fever. Has sig leukocytosis. Cultures sent. Unasyn started for aspiration  9/29 respiratory culture positive for MSSA 9/29 self extubated, required reintubation 10/2 Family identified  10/6 bronchospasm, steroids of this, bronc with clear lungs, ETT 10/9 Neuro exam/mental status remains poor. Coming down on vent requirements. BP improved, remains off of Cleviprex. D/w family re: tracheostomy. 10/10 Remains hypertensive, agitation. Sedation adjusted (Precedex, Fentanyl/Versed PRN). Increased peak pressures on vent. CXR worse with asymmetric pulmonary edema. Lasix given. Trach with CCM. 10/11 Cortrak placed 10/11 ID consulted, abx changed from Cefazolin to Cefepime 10/13 WBC up to 56 (was 49 day prior). CT chest/abd/pelv ordered. 10/15-transferred to the hospitalist service 10/16-Transferred to ICU for worsening respiratory status  10/18: Patient was taken off of ventilator and put on trach collar, patient initially did well, and then started having trouble breathing and was belly breathing, and therefore was back on the vent 10/20 Unable to tolerate Passy-Muir valve per speech therapy, Able to participate with PT 10/22- 09/26/2022>> on 60% ATC, tolerating well. Sats 93% Interim History / Subjective:   Not interactive.  No acute changes.  Objective   Blood pressure 114/87, pulse 92, temperature 98.6 F (37 C), temperature source Axillary, resp. rate 12, height 5\' 7"  (1.702 m), weight 120.3 kg, SpO2 93 %.    FiO2 (%):  [35 %-40 %] 40 %   Intake/Output Summary (Last 24 hours) at 10/03/2022 1253 Last data filed at 10/03/2022 1100 Gross per 24 hour  Intake 280 ml  Output 900 ml  Net -620 ml   Filed Weights   10/01/22 0435 10/02/22 0500 10/03/22 0401  Weight: 121.6 kg 120.1 kg 120.3  kg    Examination:  General: 61 year old male  patient lying in bed no acute distress HEENT tracheostomy is midline, does have rhonchorous upper airway noises and tracheal secretions Pulmonary: Coarse scattered rhonchi Cardiac: Regular rate and rhythm Abdomen: Soft not tender Extremities: Warm dry    Resolved Hospital Problem list   Hypernatremia Hyperkalemia Thrombocytopenia HCAP  Assessment & Plan:  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

## 2022-10-03 NOTE — Progress Notes (Signed)
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    9/28 admitted- Intracranial Hemorrhage/ R MCA ruptured aneurysm s/p ct head, TTE LVEF of 60-65% 9/29 MRI brain:Large acute intraparenchymal hemorrhage- extension into the right-greater-than-left lateral ventricle, third ventricle, and fourth ventricle. 9/29-Fever and  pneumonia,MSSA in resp culture wbc up to [facility] 9/29 self extubated, required reintubation 10/15-transferred to the hospitalist service>transferred back to ICU for worsening respiratory status> Needing back to be on vent 09/29/22: IVC filter and PEG tube placed by IR      Subjective: Seen and examined at bedside.  Responds to voice, does not follow commands.   Assessment and Plan: Principal Problem:   ICH (intracerebral hemorrhage) (HCC) Active Problems:   Intracranial hemorrhage (HCC)   Pressure injury of skin   Acute respiratory failure with hypoxia (HCC)   Aspiration pneumonia of both lungs (HCC)   Hypernatremia   Tracheostomy in place (HCC)   Ventilator associated pneumonia (HCC)   Leukocytosis   Fever    Acute hypoxic respiratory failure 2/2 pneumonia Pneumonia due to MSSA and Enterobacter S/P tracheostomy in place-on trach collar: Overall respiratory status stable on trach collar.Completed antibiotics.  Continue with trach management- pulmonary hygiene/respiratory care, suctioning,position changing. Pulmonary following intermittently.  RT following  Intracranial  hemorrhage R MCA ruptured aneurysm  s/p R ICA coil embolization of right R ICA terminus aneurysm on 09-18-22.  Blood pressure stable at goal  Acute toxic metabolic encephalopathy Nonverbal Continue supportive care, frequent position changing  Dysphagia s/p PEG tube by IR 10/26> continue tube feeding, continue PPI  Bilateral DVTs Not a candidate for anticoagulation due to [name], s/p IVC filter on 09/29/22 by IR  Hypertension Stable Cont Metoprolol 25 bid,lisinopril 40 daily, amlodipine 10 mg.  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 Patient will need follow-up with hematology  Type 2 diabetes mellitus  A1c 6.5 Continue SSI while on tube feeding    Depression Thrombocytopenia: Monitor platelet count Nonsustained V. Tach: Monitor electrolytes Rectal tube in place: Continue supportive care ZDG:LOVFIEP poor prognosis. Palliative care following closely. After a conference call with family 10/28> CODE STATUS changed to DNR/DNI. Do not place back on vent support  At risk of malnutrition with inadequate oral intake plan for feeding tube w/ PEG Nutrition Problem: Inadequate oral intake Etiology: inability to eat Signs/Symptoms: NPO status Interventions: Prostat, Tube feeding   Morbid obesity:Patient's Body mass index is 41.54 kg/m.   Pressure injury:Pretibial right distal stage 2 PIR:[facility] WOUND CARE  DVT prophylaxis: heparin injection 5,000 Units Start: 09/03/22 1400 SCD's Start: 09/18/2022 0930 Code Status:   Code Status: DNR Family Communication: None at bedside    Patient status is: Inpatient because of respiratory failure, encephalopathy Level of care: Progressive  Dispo: The patient is from: home            Anticipated disposition: TBD   Objective: Vitals last 24 hrs: Vitals:   10/03/22 1103 10/03/22 1211 10/03/22 1522 10/03/22 1659  BP:  114/87  (!) 135/91  Pulse: 90 92 67   Resp:  15 12 15 16   Temp:   98.6 F (37 C)  98 F (36.7 C)  TempSrc:  Axillary  Axillary  SpO2: 96% 93% 94% 98%  Weight:      Height:       Weight change: 0.2 kg  Physical Examination: General: NAD, awake Cardiovascular: S1, S2 present Respiratory: CTAB Abdomen: Soft, nontender, nondistended, bowel sounds present, PEG tube, rectal tube Musculoskeletal: No bilateral pedal edema noted Skin: Normal Psychiatry: Unable to assess   Medications reviewed:  Scheduled Meds:  amLODipine  10 mg Per Tube Daily   arformoterol  15 mcg Nebulization BID   feeding supplement (PROSource TF20)  60 mL Per Tube BID   fiber  1 packet Per Tube BID   free water  200 mL Per Tube Q6H   heparin injection (subcutaneous)  5,000 Units Subcutaneous Q8H   insulin aspart  0-15 Units Subcutaneous Q4H   iohexol  100 mL Oral Once   lisinopril  40 mg Per Tube Daily   metoprolol tartrate  25 mg Per Tube BID   mouth rinse  15 mL Mouth Rinse 4 times per day   pantoprazole  40 mg Per Tube Daily   revefenacin  175 mcg Nebulization Daily   Continuous Infusions:   ceFAZolin (ANCEF) IV     feeding supplement (JEVITY 1.5 CAL/FIBER) 1,000 mL (10/02/22 [id number])   Diet Order             Diet NPO time specified Except for: Ice Chips  Diet effective now                  Unresulted Labs (From admission, onward)    None     Data Reviewed: I have personally reviewed following labs and imaging studies CBC: Recent Labs  Lab 09/27/22 0554 09/29/22 0458 09/30/22 0542 10/02/22 0327  WBC 41.8* 41.9* 47.5* 41.0*  HGB 11.9* 13.1 12.8* 12.8*  HCT 36.8* 40.1 39.5 39.5  MCV 94.1 92.4 93.4 93.4  PLT 177 182 218 [id number]   Basic Metabolic Panel: Recent Labs  Lab 09/27/22 0554 09/28/22 0538 09/29/22 0458 10/02/22 0327  NA 139 139 139 143  K 4.4 3.9 4.4 4.1  CL 103 99 101 104  CO2 28 29 29 27   GLUCOSE 146* 129* 126* 143*  BUN 56* 51* 42* 28*  CREATININE 1.31* 1.05 1.00 0.87  CALCIUM 9.7 10.0 9.9 9.5  MG 2.0  --   --   --   PHOS  --   4.1  --   --   GFR: Estimated Creatinine Clearance: 110.7 mL/min (by C-G formula based on SCr of 0.87 mg/dL). Liver Function Tests: Recent Labs  Lab 09/28/22 0538 09/29/22 0458  AST  --  29  ALT  --  40  ALKPHOS  --  72  BILITOT  --  0.6  PROT  --  7.5  ALBUMIN 3.0* 3.1*   Recent Labs  Lab 10/02/22 1133 10/02/22 1609 10/02/22 2004 10/02/22 2050 10/02/22 2331  GLUCAP 126* 122* 137* 144* 136*  Antimicrobials: Anti-infectives (From admission, onward)    Start     Dose/Rate Route Frequency Ordered Stop   09/29/22 1445  ceFAZolin (ANCEF) IVPB 2g/100 mL premix        2 g 200 mL/hr over 30 Minutes Intravenous  Once 09/29/22 1346     09/29/22 1145  ceFAZolin (ANCEF) IVPB 1 g/50 mL premix  Status:  Discontinued        1 g 100 mL/hr over 30  Minutes Intravenous To Radiology 09/29/22 1057 09/29/22 1059   09/29/22 1145  ceFAZolin (ANCEF) IVPB 2g/100 mL premix        2 g 200 mL/hr over 30 Minutes Intravenous  Once 09/29/22 1059 09/29/22 1159   09/14/22 1400  ceFEPIme (MAXIPIME) 2 g in sodium chloride 0.9 % 100 mL IVPB        2 g 200 mL/hr over 30 Minutes Intravenous Every 8 hours 09/14/22 1312 09/21/22 0922   09/10/22 0830  ceFAZolin (ANCEF) IVPB 1 g/50 mL premix  Status:  Discontinued        1 g 100 mL/hr over 30 Minutes Intravenous Every 8 hours 09/10/22 0738 09/10/22 0821   09/10/22 0830  ceFAZolin (ANCEF) IVPB 2g/100 mL premix  Status:  Discontinued        2 g 200 mL/hr over 30 Minutes Intravenous Every 8 hours 09/10/22 0821 09/14/22 1312   09/07/22 0815  ceFAZolin (ANCEF) IVPB 2g/100 mL premix        2 g 200 mL/hr over 30 Minutes Intravenous Every 8 hours 09/07/22 0807 09/08/22 1742   09/04/22 2200  Ampicillin-Sulbactam (UNASYN) 3 g in sodium chloride 0.9 % 100 mL IVPB  Status:  Discontinued        3 g 200 mL/hr over 30 Minutes Intravenous Every 24 hours 09/04/22 0722 09/04/22 1506   09/04/22 2200  ceFAZolin (ANCEF) IVPB 1 g/50 mL premix  Status:  Discontinued        1  g 100 mL/hr over 30 Minutes Intravenous Every 12 hours 09/04/22 1506 09/07/22 0807   09/02/22 1030  Ampicillin-Sulbactam (UNASYN) 3 g in sodium chloride 0.9 % 100 mL IVPB  Status:  Discontinued        3 g 200 mL/hr over 30 Minutes Intravenous Every 6 hours 09/02/22 1009 09/04/22 0722      Culture/Microbiology    Component Value Date/Time   SDES BRONCHIAL ALVEOLAR LAVAGE 09/19/2022 1909   SPECREQUEST NONE 09/19/2022 1909   CULT FEW ENTEROBACTER CLOACAE 09/19/2022 1909   REPTSTATUS 09/22/2022 FINAL 09/19/2022 1909     Radiology Studies: No results found.   LOS: 32 days   Alma Friendly, MD [facility]  10/03/2022, 5:14 PM

## 2022-10-04 LAB — GLUCOSE, CAPILLARY
Glucose-Capillary: 107 mg/dL — ABNORMAL HIGH (ref 70–99)
Glucose-Capillary: 110 mg/dL — ABNORMAL HIGH (ref 70–99)
Glucose-Capillary: 113 mg/dL — ABNORMAL HIGH (ref 70–99)
Glucose-Capillary: 113 mg/dL — ABNORMAL HIGH (ref 70–99)
Glucose-Capillary: 141 mg/dL — ABNORMAL HIGH (ref 70–99)
Glucose-Capillary: 142 mg/dL — ABNORMAL HIGH (ref 70–99)

## 2022-10-04 NOTE — Progress Notes (Signed)
PROGRESS NOTE    Dustin Jones  [id number] DOB: July 08, 1961 DOA: 08/30/2022 PCP: Marliss Coots, NP   Brief Narrative: 61 year old with past medical history significant for hypertension, history of DVT admitted 9/28 as a code stroke, admitted to ICU with working diagnosis of intracranial hemorrhage right MCA ruptured aneurysm, has had prolonged hospitalization including multiple procedures, work-up including right ICA coil embolization of right ICA terminus aneurysm on 08/09/2022, tracheostomy by CCM on 10/10 and transferred to [facility] on 10/22.  9/28 admitted intracranial hemorrhage right MCA ruptured aneurysm status post CT head. 9/29: MRI brain large acute intraparenchymal hemorrhage extension into the right greater than left lateral ventricle, third ventricle and fourth ventricle. 9/29 developed fever and pneumonia, MSSA and respiratory culture white blood cell up to 56.  Treated. 10/15 transferred to the hospitalist service, transferred back to ICU for worsening respiratory status.  Was placed back on vent. 10/26; IVC filter and PEG tube placed by IR. 10/28: After conference call with family CODE STATUS was changed to DNR/DNI do not placed back on vent support.   Assessment & Plan:   Principal Problem:   ICH (intracerebral hemorrhage) (HCC) Active Problems:   Intracranial hemorrhage (HCC)   Pressure injury of skin   Acute respiratory failure with hypoxia (HCC)   Aspiration pneumonia of both lungs (HCC)   Hypernatremia   Tracheostomy in place (HCC)   Ventilator associated pneumonia (HCC)   Leukocytosis   Fever   1-acute hypoxic respiratory failure secondary to pneumonia Pneumonia due to MSSA and Enterobacter Status post tracheostomy in place on trach collar -He completed antibiotics for pneumonia -Continue with trach management, CCM following weekly -Continue with respiratory care suctioning and positioning.  2-intracranial hemorrhage right MCA rupture aneurysmal Status  post R ICA coil embolization of right ICA terminus aneurysm on 09/03/2022 Continue to monitor blood pressure  Acute metabolic encephalopathy Nonverbal Continue with supportive care, frequent repositioning.  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 Currently around 40 Needs follow-up with hematology  Diabetes type 2 Continue  with a sliding scale insulin  Depression Thrombocytopenia: Follow trend Nonsustained V. tach: Continue with metoprolol Rectal tube in place: Diarrhea supportive care probably related to tube feeding Risk of malnutrition with inadequate oral intake continue with tube feedings Morbid obesity BMI 41:  Wound care documentation below, pressure injury also pretibial right distal stage II POA: Continue with wound care     Pressure Injury 08/08/2022 Pretibial Distal;Right;Lateral Stage 2 -  Partial thickness loss of dermis presenting as a shallow open injury with a red, pink wound bed without slough. Old healing ulcer (Active)  08/24/2022 1630  Location: Pretibial  Location Orientation: Distal;Right;Lateral  Staging: Stage 2 -  Partial thickness loss of dermis presenting as a shallow open injury with a red, pink wound bed without slough.  Wound Description (Comments): Old healing ulcer  Present on Admission: Yes  Dressing Type Foam - Lift dressing to assess site every shift 10/03/22 2050     Nutrition Problem: Inadequate oral intake Etiology: inability to eat    Signs/Symptoms: NPO status    Interventions: Prostat, Tube feeding  Estimated body mass index is 41.05 kg/m as calculated  from the following:    Height as of this encounter: 5\' 7"  (1.702 m).   Weight as of this encounter: 118.9 kg.   DVT prophylaxis: heparin  Code Status: DNR/DNI Family Communication:no family at bedside.  Disposition Plan:  Status is: Inpatient Remains inpatient appropriate because: needs placement    Consultants:  Palliative neurology    Subjective: He is alert, open yes to voice, non verbal.   Objective: Vitals:   10/04/22 0126 10/04/22 0327 10/04/22 0350 10/04/22 0413  BP:  130/89    Pulse: 62 (!) 59    Resp: 14 12 20    Temp:  98.4 F (36.9 C)    TempSrc:  Oral    SpO2: 100% 99% 99%   Weight:    118.9 kg  Height:        Intake/Output Summary (Last 24 hours) at 10/04/2022 0731 Last data filed at 10/03/2022 2300 Gross per 24 hour  Intake 4860 ml  Output 1100 ml  Net 3760 ml   Filed Weights   10/02/22 0500 10/03/22 0401 10/04/22 0413  Weight: 120.1 kg 120.3 kg 118.9 kg    Examination:  General exam: Appears calm and comfortable  Respiratory system: Clear to auscultation. Respiratory effort normal. Cardiovascular system: S1 & S2 heard, RRR.  Gastrointestinal system: Abdomen is nondistended, soft and nontender. No organomegaly or masses felt. Normal bowel sounds heard. Peg tube in place Central nervous system: Alert , non verbal, not following command Extremities: trace edema  Data Reviewed: I have personally reviewed following labs and imaging studies  CBC: Recent Labs  Lab 09/29/22 0458 09/30/22 0542 10/02/22 0327  WBC 41.9* 47.5* 41.0*  HGB 13.1 12.8* 12.8*  HCT 40.1 39.5 39.5  MCV 92.4 93.4 93.4  PLT 182 218 [id number]   Basic Metabolic Panel: Recent Labs  Lab 09/28/22 0538 09/29/22 0458 10/02/22 0327  NA 139 139 143  K 3.9 4.4 4.1  CL 99 101 104  CO2 29 29 27   GLUCOSE 129* 126* 143*  BUN 51* 42* 28*  CREATININE 1.05 1.00 0.87  CALCIUM 10.0 9.9 9.5  PHOS 4.1  --   --    GFR: Estimated Creatinine Clearance: 110 mL/min (by C-G formula based on SCr of 0.87  mg/dL). Liver Function Tests: Recent Labs  Lab 09/28/22 0538 09/29/22 0458  AST  --  29  ALT  --  40  ALKPHOS  --  72  BILITOT  --  0.6  PROT  --  7.5  ALBUMIN 3.0* 3.1*   No results for input(s): "LIPASE", "AMYLASE" in the last 168 hours. No results for input(s): "AMMONIA" in the last 168 hours. Coagulation Profile: Recent Labs  Lab 09/29/22 0458  INR 1.1   Cardiac Enzymes: No results for input(s): "CKTOTAL", "CKMB", "CKMBINDEX", "TROPONINI" in the last 168 hours. BNP (last 3 results) No results for input(s): "PROBNP" in the last 8760 hours. HbA1C: No results for input(s): "HGBA1C" in the last 72 hours. CBG: Recent Labs  Lab 10/02/22 2050 10/02/22 2331 10/03/22 1951 10/03/22 2335 10/04/22 0325  GLUCAP 144* 136* 123* 133* 141*   Lipid Profile: No results for input(s): "CHOL", "HDL", "LDLCALC", "TRIG", "CHOLHDL", "LDLDIRECT" in the last 72 hours. Thyroid Function Tests: No results for input(s): "TSH", "T4TOTAL", "FREET4", "T3FREE", "THYROIDAB" in the last 72 hours. Anemia Panel: No results for input(s): "VITAMINB12", "FOLATE", "FERRITIN", "TIBC", "IRON", "RETICCTPCT" in the last 72 hours. Sepsis Labs: No results for input(s): "PROCALCITON", "LATICACIDVEN" in the last 168 hours.  No results found for this or  any previous visit (from the past 240 hour(s)).       Radiology Studies: No results found.      Scheduled Meds:  amLODipine  10 mg Per Tube Daily   arformoterol  15 mcg Nebulization BID   feeding supplement (PROSource TF20)  60 mL Per Tube BID   fiber  1 packet Per Tube BID   free water  200 mL Per Tube Q6H   heparin injection (subcutaneous)  5,000 Units Subcutaneous Q8H   insulin aspart  0-15 Units Subcutaneous Q4H   iohexol  100 mL Oral Once   lisinopril  40 mg Per Tube Daily   metoprolol tartrate  25 mg Per Tube BID   mouth rinse  15 mL Mouth Rinse 4 times per day   pantoprazole  40 mg Per Tube Daily   revefenacin  175 mcg Nebulization  Daily   Continuous Infusions:   ceFAZolin (ANCEF) IV     feeding supplement (JEVITY 1.5 CAL/FIBER) 1,000 mL (10/02/22 0923)     LOS: 33 days    Time spent: 35 minutes    Jeyli Zwicker A Roselani Grajeda, MD [facility]   If 7PM-7AM, please contact night-coverage [email or web address]  10/04/2022, 7:31 AM

## 2022-10-04 NOTE — TOC Progression Note (Signed)
Transition of Care [name]) - Progression Note    Patient Details  Name: ABDULAHI Jones MRN: [id number] Date of Birth: [birth year]  Transition of Care [name]) CM/SW [name], [name] Phone Number: 10/04/2022, 3:14 PM  Clinical Narrative:    CSW was advised that [name] is able to manage trach pt's and has, on occasion, taken LOG's. CSW followed up with facility. They stated they take LOG's on a case by case basis, they do not have any trach beds at the moment, but would be happy to review when they have an opening. TOC will continue to follow for DC planning.        Expected Discharge Plan and Services                                                 Social Determinants of Health (SDOH) Interventions    Readmission Risk Interventions     No data to display

## 2022-10-04 NOTE — Progress Notes (Signed)
Speech Language Pathology Treatment: Dysphagia;Cognitive-Linquistic;Passy [facility] Speaking valve  Patient Details Name: Dustin Jones MRN: [id number] DOB: 03/22/1961 Today's Date: 10/04/2022 Time: 1107-1130 SLP Time Calculation (min) (ACUTE ONLY): 23 min  Assessment / Plan / Recommendation Clinical Impression  SWALLOWING SLP provided oral care prior to administration of PO trials.  PMV in place for PO trials.  Pt tolerated ice chips, puree, and thin liquid by straw without any clinical s/s of aspiration.  Pt exhibited adequate oral clearance of puree.  Unable to fully visualize oral cavity.  SLP introduced suction catheter after trials, with very small amount of puree residue removed.  Pt would benefit from instrumental assessment prior to initiation of PO diet given new trach placement.  Will plan for MBSS next date.  Recommend pt remain NPO with alternate means of nutrition, hydration, and medication at present.  PMSV Pt tolerated valve placement for duration of session.  Vital signs remained stable throughout.  Pt with adequate phonation for word level communication.  Low vocal intensity noted, but pt able to increase volume with encouragement.  Pt was able answer paired yes/no questions accurately, and completed some naming tasks with encouragement.  Verbal output increased over course of session. Pt with tracheal expectoration of secretions after valve removal.  SLP placed PMSV guideline sign in room.   Pt may wear valve intermittently with staff with direct supervision.  COMMUNICATION Pt with improved ability to participate in communication tasks today.  Pt followed simple 1 step directions for body movement with 80% accuracy.  Pt identified objects from field of 2 with 50% accuracy with R preference noted.  Pt answered yes/no questions for personal information with 100% accuracy and at this point in the session began verbalizing.  Pt shook his head for no, but answered "Yes" to biographical  questions.  Pt did not participate in rote speech task (choral counting).  With naming task, pt intermittently did not respond, but did respond to 6 of 10 confrontational naming trials, all of which were accurate.  This marks a significant improvement in expressive communication.  Recommend asking paired yes/no questions to determine basic wants/needs. Encourage pt to verbalize with PMSV in place.      HPI HPI: Dustin Jones is a 61 y.o. male who is homeless, presenting 9/28 after being found down by bystanders. 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 S/P coil embolization of R ICA aneurysm 9/28. Intubated 9/29. Trach placement 10/10. Cortrak 10/11.  No PMH on file.      SLP Plan  Continue with current plan of care;MBS      Recommendations for follow up therapy are one component of a multi-disciplinary discharge planning process, led by the attending physician.  Recommendations may be updated based on patient status, additional functional criteria and insurance authorization.    Recommendations  Diet recommendations: NPO Medication Administration: Via alternative means      Patient may use Passy-Muir Speech Valve: with SLP only;Intermittently with supervision PMSV Supervision: Full         Oral Care Recommendations: Oral care QID Follow Up Recommendations: Skilled nursing-short term rehab (<3 hours/day) Assistance recommended at discharge: Frequent or constant Supervision/Assistance SLP Visit Diagnosis: Dysphagia, unspecified (R13.10);Aphonia (R49.1);Cognitive communication deficit (R41.841) Plan: Continue with current plan of care;MBS           Celedonio Savage, [place], [place] Office: [phone] 10/04/2022, 11:36 AM

## 2022-10-04 NOTE — Progress Notes (Signed)
Physical Therapy Treatment Patient Details Name: Dustin Jones MRN: [id number] DOB: 1961-02-07 Today's Date: 10/04/2022   History of Present Illness The pt is a 61 y.o. unidentified homeless male presenting 9/28 after being found down by bystanders. 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 S/P coil embolization of R ICA aneurysm 9/28. Intubated 9/29. Trach placement 10/10. No PMH on file.    PT Comments    Pt with great progress this date, following all commands with increased time, able to complete AROM of RUE, RLE and LUE, and AAROM of LLE when prompted. Pt needing mod-max assist for bed mobility with pt able to maintain sitting EOB without assist for ~5 mins and complete seated therex in sitting without physical assist to maintain upright and centered posture. Pt verbalizing short answers to questions during session. Pt continues to benefit from skilled PT services to progress toward functional mobility goals.    Recommendations for follow up therapy are one component of a multi-disciplinary discharge planning process, led by the attending physician.  Recommendations may be updated based on patient status, additional functional criteria and insurance authorization.  Follow Up Recommendations  Skilled nursing-short term rehab (<3 hours/day) (vs LTACH) Can patient physically be transported by private vehicle: No   Assistance Recommended at Discharge Frequent or constant Supervision/Assistance  Patient can return home with the following Two people to help with walking and/or transfers;Two people to help with bathing/dressing/bathroom;Assistance with cooking/housework;Assistance with feeding;Direct supervision/assist for medications management;Direct supervision/assist for financial management;Help with stairs or ramp for entrance;Assist for transportation   Equipment  Recommendations  Other (comment) (TBA)    Recommendations for Other Services OT consult     Precautions / Restrictions Precautions Precautions: Fall Precaution Comments: trach, flexiseal; cortrak; Restrictions Weight Bearing Restrictions: No     Mobility  Bed Mobility Overal bed mobility: Needs Assistance Bed Mobility: Rolling, Supine to Sit, Sit to Supine Rolling: +2 for physical assistance, +2 for safety/equipment, Max assist   Supine to sit: +2 for physical assistance, Max assist Sit to supine: +2 for physical assistance, +2 for safety/equipment, Mod assist, Max assist   General bed mobility comments: with HOB elevated PTA and MT helicoptered pt to EOB, pt with improved RUE pushing, pt able to come to sidelying and swing RLE into bed with mod asssit with pt initiating movement, max asssit for LLE    Transfers                   General transfer comment: deferred, unsafe at this time    Ambulation/Gait               General Gait Details: unable   Stairs             Wheelchair Mobility    Modified Rankin (Stroke Patients Only)       Balance Overall balance assessment: Needs assistance Sitting-balance support: Feet supported, Single extremity supported Sitting balance-Leahy Scale: Poor Sitting balance - Comments: with extended time pt able to maintain sititng balance without UE support ~5 mins sitting EOB, started in propped sitting on R elbow (as pt with tendency to push with RUE) pt able to return to sitting upright with mod assist, pt with circular "wobble" during sitting but able to correct posture and come back to midline and not lose balance  Cognition Arousal/Alertness: Awake/alert Behavior During Therapy: Flat affect Overall Cognitive Status: Impaired/Different from baseline Area of Impairment: Attention, Following commands, Problem solving                   Current Attention  Level: Focused   Following Commands: Follows one step commands inconsistently, Follows one step commands with increased time     Problem Solving: Slow processing, Decreased initiation, Difficulty sequencing, Requires verbal cues, Requires tactile cues General Comments: pt responding to questions this session, able to answer "a little of everything" when asked what kind of music he likes, follow commands with UE/LE movement bilaterally        Exercises General Exercises - Upper Extremity Shoulder Flexion: AROM, Right, Left, 5 reps, AAROM Shoulder Extension: AROM, AAROM, Right, Left, 5 reps General Exercises - Lower Extremity Long Arc Quad: AROM, Right, 5 reps, Seated, AAROM, Left (AAROM on L) Hip Flexion/Marching: AROM, Right, Left, 5 reps, Seated    General Comments        Pertinent Vitals/Pain Pain Assessment Pain Assessment: No/denies pain Pain Intervention(s): Monitored during session    Home Living                          Prior Function            PT Goals (current goals can now be found in the care plan section) Acute Rehab PT Goals Patient Stated Goal: did not state PT Goal Formulation: Patient unable to participate in goal setting Time For Goal Achievement: 09/28/22    Frequency    Min 2X/week      PT Plan Current plan remains appropriate    Co-evaluation              AM-PAC PT "6 Clicks" Mobility   Outcome Measure  Help needed turning from your back to your side while in a flat bed without using bedrails?: Total Help needed moving from lying on your back to sitting on the side of a flat bed without using bedrails?: Total Help needed moving to and from a bed to a chair (including a wheelchair)?: Total Help needed standing up from a chair using your arms (e.g., wheelchair or bedside chair)?: Total Help needed to walk in hospital room?: Total Help needed climbing 3-5 steps with a railing? : Total 6 Click Score: 6    End of Session  Equipment Utilized During Treatment: Oxygen Lurline Idol) Activity Tolerance: Patient tolerated treatment well Patient left: in bed;with call bell/phone within reach;with bed alarm set Nurse Communication: Mobility status PT Visit Diagnosis: Muscle weakness (generalized) (M62.81);Difficulty in walking, not elsewhere classified (R26.2);Other symptoms and signs involving the nervous system [id number])     Time: [id number] PT Time Calculation (min) (ACUTE ONLY): 28 min  Charges:  $Therapeutic Activity: 23-37 mins                     Emary Zalar R. PTA Acute Rehabilitation Services Office: [place] 10/04/2022, 4:04 PM

## 2022-10-04 NOTE — Progress Notes (Signed)
Daily Progress Note   Patient Name: Dustin Jones       Date: 10/04/2022 DOB: January 08, 1961  Age: 61 y.o. MRN#: [id number] Attending Physician: Elmarie Shiley, MD Primary Care Physician: Marliss Coots, NP Admit Date: 08/22/2022  Reason for Consultation/Follow-up: Establishing goals of care  Patient Profile/HPI:  61 y.o. male  with past medical history of hypertension and DVT, houseless, admitted on 08/06/2022 with ICH due to aneurysm, s/p coiling. Recovery complicated by respiratory failure, pneumonia. Has trach, currently on trach collar 60% 10L. Poor mentation, following simple commands intermittently. Cortrak in place. SLP re-eval pending. Palliative medicine consulted for above.    10/31- s/p PEG placement, participating more today with SLP, plan for MBSS tomorrow  Subjective: Chart reviewed including labs, progress notes, imaging from this and previous encounters.  Evaluated patient he said "hello" in greeting. No other verbal or nonverbal interaction.  Called daughter [name] for followup. Medical update given. Her brother is attempting to visit. She is hoping to visit on Friday or over the weekend.  She didn't have any questions or concerns. Endorses continued full scope with limit set at DNR.   Physical Exam Vitals and nursing note reviewed.  Constitutional:      Appearance: He is ill-appearing.  Neurological:     Mental Status: He is alert.     Comments: Moving BLE and RUE, LUE hemiparesis, did not follow commands             Vital Signs: BP (!) 150/87 (BP Location: Right Arm)   Pulse 67   Temp 98.5 F (36.9 C) (Axillary)   Resp 14   Ht 5\' 7"  (1.702 m)   Wt 118.9 kg   SpO2 100%   BMI 41.05 kg/m  SpO2: SpO2: 100 % O2 Device: O2 Device: Tracheostomy Collar O2 Flow  Rate: O2 Flow Rate (L/min): 5 L/min  Intake/output summary:  Intake/Output Summary (Last 24 hours) at 10/04/2022 1307 Last data filed at 10/03/2022 2300 Gross per 24 hour  Intake 4800 ml  Output 1100 ml  Net 3700 ml   LBM: Last BM Date : 10/03/22 Baseline Weight: Weight: 100 kg Most recent weight: Weight: 118.9 kg       Palliative Assessment/Data: PPS: 20%      Patient Active Problem List   Diagnosis Date Noted  Ventilator associated pneumonia ([facility]) 09/15/2022   Leukocytosis 09/15/2022   Fever 09/15/2022   Hypernatremia    Tracheostomy in place [facility])    Acute respiratory failure with hypoxia ([facility])    Aspiration pneumonia of both lungs ([facility])    ICH (intracerebral hemorrhage) ([place]) 09-24-2022   Intracranial hemorrhage ([place]) 2022-09-24   Pressure injury of skin 09-24-22    Palliative Care Assessment & Plan    Assessment/Recommendations/Plan  Continue current plan of care PMT will follow peripherally and intervene if pt has status change, please call if further assistance is needed  Code Status: DNR  Prognosis:  Unable to determine  Discharge Planning: To Be Determined  Care plan was discussed with patient's daughter  Thank you for allowing the Palliative Medicine Team to assist in the care of this patient.  Total time: 45 mins  Greater than 50%  of this time was spent counseling and coordinating care related to the above assessment and plan.  Mariana Kaufman, AGNP-C Palliative Medicine   Please contact Palliative Medicine Team phone at [phone] for questions and concerns.

## 2022-10-05 ENCOUNTER — Other Ambulatory Visit: Payer: Self-pay

## 2022-10-05 ENCOUNTER — Inpatient Hospital Stay (HOSPITAL_COMMUNITY): Payer: Medicaid Other

## 2022-10-05 LAB — CBC
HCT: 39.2 % (ref 39.0–52.0)
Hemoglobin: 12.4 g/dL — ABNORMAL LOW (ref 13.0–17.0)
MCH: 29.5 pg (ref 26.0–34.0)
MCHC: 31.6 g/dL (ref 30.0–36.0)
MCV: 93.3 fL (ref 80.0–100.0)
Platelets: 154 10*3/uL (ref 150–400)
RBC: 4.2 MIL/uL — ABNORMAL LOW (ref 4.22–5.81)
RDW: 12.7 % (ref 11.5–15.5)
WBC: 35.4 10*3/uL — ABNORMAL HIGH (ref 4.0–10.5)
nRBC: 0.1 % (ref 0.0–0.2)

## 2022-10-05 LAB — MAGNESIUM: Magnesium: 2.1 mg/dL (ref 1.7–2.4)

## 2022-10-05 LAB — GLUCOSE, CAPILLARY
Glucose-Capillary: 110 mg/dL — ABNORMAL HIGH (ref 70–99)
Glucose-Capillary: 114 mg/dL — ABNORMAL HIGH (ref 70–99)
Glucose-Capillary: 118 mg/dL — ABNORMAL HIGH (ref 70–99)
Glucose-Capillary: 123 mg/dL — ABNORMAL HIGH (ref 70–99)
Glucose-Capillary: 124 mg/dL — ABNORMAL HIGH (ref 70–99)
Glucose-Capillary: 128 mg/dL — ABNORMAL HIGH (ref 70–99)
Glucose-Capillary: 131 mg/dL — ABNORMAL HIGH (ref 70–99)
Glucose-Capillary: 132 mg/dL — ABNORMAL HIGH (ref 70–99)
Glucose-Capillary: 140 mg/dL — ABNORMAL HIGH (ref 70–99)
Glucose-Capillary: 160 mg/dL — ABNORMAL HIGH (ref 70–99)

## 2022-10-05 LAB — BASIC METABOLIC PANEL
Anion gap: 9 (ref 5–15)
BUN: 19 mg/dL (ref 8–23)
CO2: 29 mmol/L (ref 22–32)
Calcium: 9.4 mg/dL (ref 8.9–10.3)
Chloride: 103 mmol/L (ref 98–111)
Creatinine, Ser: 0.78 mg/dL (ref 0.61–1.24)
GFR, Estimated: >60 mL/min (ref >60)
Glucose, Bld: 119 mg/dL — ABNORMAL HIGH (ref 70–99)
Potassium: 3.7 mmol/L (ref 3.5–5.1)
Sodium: 141 mmol/L (ref 135–145)

## 2022-10-05 MED ORDER — HYDRALAZINE HCL 10 MG PO TABS
10.0000 mg | ORAL_TABLET | Freq: Two times a day (BID) | ORAL | Status: DC
  Administered 2022-10-05 – 2022-10-09 (×10): 10 mg via ORAL
  Filled 2022-10-05 (×10): qty 1

## 2022-10-05 MED ORDER — SACCHAROMYCES BOULARDII 250 MG PO CAPS
250.0000 mg | ORAL_CAPSULE | Freq: Two times a day (BID) | ORAL | Status: DC
  Administered 2022-10-05 – 2022-10-09 (×10): 250 mg via ORAL
  Filled 2022-10-05 (×11): qty 1

## 2022-10-05 MED ORDER — FAMOTIDINE 20 MG PO TABS
20.0000 mg | ORAL_TABLET | Freq: Every day | ORAL | Status: DC
  Administered 2022-10-05 – 2022-10-27 (×23): 20 mg
  Filled 2022-10-05 (×24): qty 1

## 2022-10-05 MED ORDER — FLUCONAZOLE 40 MG/ML PO SUSR
100.0000 mg | Freq: Every day | ORAL | Status: DC
  Administered 2022-10-05 – 2022-10-09 (×5): 100 mg via ORAL
  Filled 2022-10-05 (×6): qty 2.5

## 2022-10-05 NOTE — Plan of Care (Signed)
  Problem: Education: Goal: Knowledge of General Education information will improve Description: Including pain rating scale, medication(s)/side effects and non-pharmacologic comfort measures Outcome: Progressing   Problem: Health Behavior/Discharge Planning: Goal: Ability to manage health-related needs will improve Outcome: Progressing   Problem: Clinical Measurements: Goal: Ability to maintain clinical measurements within normal limits will improve Outcome: Progressing Goal: Will remain free from infection Outcome: Progressing Goal: Diagnostic test results will improve Outcome: Progressing Goal: Respiratory complications will improve Outcome: Progressing Goal: Cardiovascular complication will be avoided Outcome: Progressing   Problem: Activity: Goal: Risk for activity intolerance will decrease Outcome: Progressing   Problem: Nutrition: Goal: Adequate nutrition will be maintained Outcome: Progressing   Problem: Coping: Goal: Level of anxiety will decrease Outcome: Progressing   Problem: Elimination: Goal: Will not experience complications related to bowel motility Outcome: Progressing Goal: Will not experience complications related to urinary retention Outcome: Progressing   Problem: Pain Managment: Goal: General experience of comfort will improve Outcome: Progressing   Problem: Safety: Goal: Ability to remain free from injury will improve Outcome: Progressing   Problem: Skin Integrity: Goal: Risk for impaired skin integrity will decrease Outcome: Progressing   Problem: Education: Goal: Ability to describe self-care measures that may prevent or decrease complications (Diabetes Survival Skills Education) will improve Outcome: Progressing Goal: Individualized Educational Video(s) Outcome: Progressing   Problem: Coping: Goal: Ability to adjust to condition or change in health will improve Outcome: Progressing   Problem: Fluid Volume: Goal: Ability to  maintain a balanced intake and output will improve Outcome: Progressing   Problem: Health Behavior/Discharge Planning: Goal: Ability to identify and utilize available resources and services will improve Outcome: Progressing Goal: Ability to manage health-related needs will improve Outcome: Progressing   Problem: Metabolic: Goal: Ability to maintain appropriate glucose levels will improve Outcome: Progressing   Problem: Nutritional: Goal: Maintenance of adequate nutrition will improve Outcome: Progressing Goal: Progress toward achieving an optimal weight will improve Outcome: Progressing   Problem: Skin Integrity: Goal: Risk for impaired skin integrity will decrease Outcome: Progressing   Problem: Tissue Perfusion: Goal: Adequacy of tissue perfusion will improve Outcome: Progressing   Problem: Education: Goal: Knowledge of disease or condition will improve Outcome: Progressing Goal: Knowledge of secondary prevention will improve (SELECT ALL) Outcome: Progressing Goal: Knowledge of patient specific risk factors will improve (INDIVIDUALIZE FOR PATIENT) Outcome: Progressing Goal: Individualized Educational Video(s) Outcome: Progressing   Problem: Health Behavior/Discharge Planning: Goal: Ability to manage health-related needs will improve Outcome: Progressing   Problem: Education: Goal: Knowledge about tracheostomy care/management will improve Outcome: Progressing   Problem: Activity: Goal: Ability to tolerate increased activity will improve Outcome: Progressing   Problem: Health Behavior/Discharge Planning: Goal: Ability to manage tracheostomy will improve Outcome: Progressing   Problem: Respiratory: Goal: Patent airway maintenance will improve Outcome: Progressing   Problem: Role Relationship: Goal: Ability to communicate will improve Outcome: Progressing   Problem: Education: Goal: Knowledge of secondary prevention will improve (SELECT ALL) Outcome:  Progressing Goal: Knowledge of patient specific risk factors will improve (INDIVIDUALIZE FOR PATIENT) Outcome: Progressing   Problem: Intracerebral Hemorrhage Tissue Perfusion: Goal: Complications of Intracerebral Hemorrhage will be minimized Outcome: Progressing

## 2022-10-05 NOTE — Progress Notes (Addendum)
Modified Barium Swallow Progress Note  Patient Details  Name: Dustin Jones MRN: [id number] Date of Birth: 12-01-1961  Today's Date: 10/05/2022  Modified Barium Swallow completed.  Full report located under Chart Review in the Imaging Section.  Brief recommendations include the following:  Clinical Impression  The study was completed with PMSV both on and off with thin liquids, but it was donned for all subsequent trials. Pt presents with mild oral phase dyspahgia characterized by impaired posterior propulsion with the 40mm barium tablet, left-sided anterior spillage with thin liquids, and mildly prolonged mastication. Pt's swallow mechanism was otherwise WFL without evidence of penetration/aspiration. Throat clearing was noted intermittently during the study, but this did not align with any laryngeal invasion. A dysphagia 3 diet with thin liquids is recommended at this time and SLP will continue to follow pt.   Swallow Evaluation Recommendations       SLP Diet Recommendations: Dysphagia 3 (Mech soft) solids;Thin liquid   Liquid Administration via: Cup;Straw   Medication Administration: Crushed with puree (or via G-tube)   Supervision: Staff to assist with self feeding;Full supervision/cueing for compensatory strategies   Compensations: Slow rate   Postural Changes: Seated upright at 90 degrees   Oral Care Recommendations: Oral care BID      Solina Heron I. Hardin Negus, [place], [place] Office number [phone]  [name] 10/05/2022,10:14 AM

## 2022-10-05 NOTE — Progress Notes (Signed)
Speech Language Pathology Treatment: Dysphagia  Patient Details Name: Dustin Jones MRN: [id number] DOB: [birth year] Today's Date: 10/05/2022 Time: 6606-3016 SLP Time Calculation (min) (ACUTE ONLY): 24 min  Assessment / Plan / Recommendation Clinical Impression  Pt was seen during lunch for treatment. He consumed a meal of spaghetti, broccoli, and thin liquids. Pt tolerated all solids and liquids without overt s/s of aspiration with the exception of one instance of coughing between trials when pt fell asleep. Mastication was mildly prolonged and mild-moderate oral residue was noted which was cleared with a liquid wash. Pt often communicated via gesture that he did not want anymore, but subsequently accepted boluses that were presented. Additional trials were deferred due to pt's progressively waning alertness. Pt tolerated PMSV for the duration of the session with stable vitals and no evidence of respiratory distress. Pt demonstrated vocalization in response to questions and he verbalized twice, but utterances were minimally intelligible due to articulatory imprecision and reduced vocal intensity. Pt's current diet will be continued with recommendation for full supervision and assistance with meals.    HPI HPI: Dustin Jones is a 61 y.o. male who is homeless, presenting 9/28 after being found down by bystanders. 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 S/P coil embolization of R ICA aneurysm 9/28. Intubated 9/29. Trach placement 10/10. Cortrak 10/11.  No PMH on file.      SLP Plan  Continue with current plan of care      Recommendations for follow up therapy are one component of a multi-disciplinary discharge planning process, led by the attending physician.  Recommendations may be updated based on patient status, additional functional criteria and insurance authorization.     Recommendations  Diet recommendations: Dysphagia 3 (mechanical soft);Thin liquid Liquids provided via: Cup;Straw Medication Administration: Crushed with puree (or via G-tube) Supervision: Staff to assist with self feeding Compensations: Slow rate;Small sips/bites;Minimize environmental distractions;Follow solids with liquid Postural Changes and/or Swallow Maneuvers: Seated upright 90 degrees      Patient may use Passy-Muir Speech Valve: Intermittently with supervision PMSV Supervision: Full MD: Please consider changing trach tube to : Smaller size;Cuffless         Oral Care Recommendations: Oral care QID Follow Up Recommendations: Skilled nursing-short term rehab (<3 hours/day) Assistance recommended at discharge: Frequent or constant Supervision/Assistance SLP Visit Diagnosis: Dysphagia, oral phase (R13.11) Plan: Continue with current plan of care         Dustin Jones I. Dustin Jones, [place], Dustin Jones Office number [phone]   Dustin Jones  10/05/2022, 1:50 PM

## 2022-10-05 NOTE — Progress Notes (Incomplete)
Nutrition Follow-up  DOCUMENTATION CODES:  Obesity unspecified  INTERVENTION:  Continue current diet per SLP recommendations Continue tube feeds via PEG: Osmolite 1.5 @ 55 ml/hr (1320 ml/day) PROSource TF20 60 ml BID Free water flushes per CCM, currently 200 ml q 4 hours Tube feeding regimen provides 2140 kcal, 123g of protein, and 1006 ml of H2O. (Flush+TF=2206) Continue Banatrol TF BID  NUTRITION DIAGNOSIS:  Inadequate oral intake related to inability to eat as evidenced by NPO status. - Ongoing, being addressed via TF  GOAL:  Patient will meet greater than or equal to 90% of their needs - Met via TF  MONITOR:  TF tolerance  REASON FOR ASSESSMENT:  Consult Enteral/tube feeding initiation and management  ASSESSMENT:   Pt with unknown PMH who is reportedly homeless admitted with ICH/R MCA ruptured aneurysm now s/p coiling and acute hypoxic respiratory failure PNA/ALI due to aspiration.  09/29 - TF started 09/30 - TF changed to Nepro (providing 2456 kcal and 127 grams of protein) 10/02 - TF changed to Osmolite 1.5 10/10 - s/p trach placement 10/11 - s/p cortrak placement (tip gastric) 10/12 - weaned to trach collar 10/16 - transferred to ICU, back on vent support 10/18 - Initial PMV assessment 10/22 - Transferred out of ICU 10/26 - PEG placed  Admit weight: 100 kg Current weight: 119 kg  ***  Nutritionally Relevant Medications: Scheduled Meds:  famotidine  20 mg Per Tube Daily   feeding supplement (PROSource TF20)  60 mL Per Tube BID   fiber  1 packet Per Tube BID   fluconazole  100 mg Oral Daily   free water  200 mL Per Tube Q6H   insulin aspart  0-15 Units Subcutaneous Q4H   iohexol  100 mL Oral Once   saccharomyces boulardii  250 mg Oral BID   Continuous Infusions:   ceFAZolin (ANCEF) IV     feeding supplement (JEVITY 1.5 CAL/FIBER) 1,000 mL (10/05/22 0520)   Labs Reviewed: CBG ranges from 110-142 mg/dL over the last 24 hours  Diet Order:   Diet  Order             DIET DYS 3 Room service appropriate? Yes with Assist; Fluid consistency: Thin  Diet effective now                   EDUCATION NEEDS:  No education needs have been identified at this time  Skin: Skin Integrity Issues: Stage II: R leg (pretibial) - old healing ulcer Other: MARSI penis  Last BM:  10/25 - about 200-300 mL via rectal tube just today  Height:  Ht Readings from Last 1 Encounters:  09/18/22 _0  (1.702 m)    Weight:  Wt Readings from Last 1 Encounters:  10/05/22 119 kg    BMI:  Body mass index is 41.09 kg/m.  Estimated Nutritional Needs:  Kcal:  2000-2200 Protein:  100-120 grams Fluid:  >2 L/day    Ranell Patrick, RD, LDN Clinical Dietitian RD pager # available in [place]  After hours/weekend pager # available in [facility]

## 2022-10-05 NOTE — Progress Notes (Signed)
PROGRESS NOTE    Dustin Jones  [id number] DOB: 18-Jul-1961 DOA: 08/29/2022 PCP: Marliss Coots, NP   Brief Narrative: 61 year old with past medical history significant for hypertension, history of DVT admitted 9/28 as a code stroke, admitted to ICU with working diagnosis of intracranial hemorrhage right MCA ruptured aneurysm, has had prolonged hospitalization including multiple procedures, work-up including right ICA coil embolization of right ICA terminus aneurysm on 08/20/2022, tracheostomy by CCM on 10/10 and transferred to [facility] on 10/22.  9/28 admitted intracranial hemorrhage right MCA ruptured aneurysm status post CT head. 9/29: MRI brain large acute intraparenchymal hemorrhage extension into the right greater than left lateral ventricle, third ventricle and fourth ventricle. 9/29 developed fever and pneumonia, MSSA and respiratory culture white blood cell up to 56.  Treated. 10/15 transferred to the hospitalist service, transferred back to ICU for worsening respiratory status.  Was placed back on vent. 10/26; IVC filter and PEG tube placed by IR. 10/28: After conference call with family CODE STATUS was changed to DNR/DNI do not placed back on vent support. 11/01: speech recommend Dysphagia 3 diet.   Assessment & Plan:   Principal Problem:   ICH (intracerebral hemorrhage) (HCC) Active Problems:   Intracranial hemorrhage (HCC)   Pressure injury of skin   Acute respiratory failure with hypoxia (HCC)   Aspiration pneumonia of both lungs (HCC)   Hypernatremia   Tracheostomy in place (HCC)   Ventilator associated pneumonia (HCC)   Leukocytosis   Fever   1-Acute Hypoxic Respiratory Failure secondary to Pneumonia Pneumonia due to MSSA and Enterobacter Status post tracheostomy in place on trach collar -He completed antibiotics for pneumonia -Continue with trach management, CCM following weekly -Continue with respiratory care suctioning and positioning. -Stable trach collar.    2-Intracranial hemorrhage right MCA rupture aneurysmal Status post R ICA coil embolization of right ICA terminus aneurysm on 08/14/2022 Continue to monitor blood pressure Continue with metoprolol, Norvasc and lisinopril Acute metabolic encephalopathy Nonverbal Continue with supportive care, frequent repositioning.  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 Currently around 76 Needs follow-up with hematology  Diabetes type 2 Continue  with a sliding scale insulin  Depression Thrombocytopenia: Resolved Nonsustained V. tach: Continue with metoprolol Rectal tube in place: Diarrhea supportive care probably related to tube feeding Risk of malnutrition with inadequate oral intake continue with tube feedings Morbid obesity BMI 41: Oral thrush: Start Diflucan Wound care documentation below, pressure injury also pretibial right distal stage II POA: Continue with wound care     Pressure Injury 08/20/2022 Pretibial Distal;Right;Lateral Stage 2 -  Partial thickness loss of dermis presenting as a shallow open injury with a red, pink wound bed without slough. Old healing ulcer (Active)  08/20/2022 1630  Location: Pretibial  Location Orientation: Distal;Right;Lateral  Staging: Stage 2 -  Partial thickness loss of dermis presenting as a shallow open injury with a red, pink wound bed without slough.  Wound Description (Comments): Old healing ulcer  Present on Admission: Yes  Dressing Type Foam - Lift dressing to assess site every shift 10/04/22 2000     Nutrition Problem: Inadequate oral intake Etiology: inability to  eat  Signs/Symptoms: NPO status    Interventions: Prostat, Tube feeding  Estimated body mass index is 41.09 kg/m as calculated from the following:   Height as of this encounter: 5\' 7"  (1.702 m).   Weight as of this encounter: 119 kg.   DVT prophylaxis: heparin  Code Status: DNR/DNI Family Communication:no family at bedside.  Disposition Plan:  Status is: Inpatient Remains inpatient appropriate because: needs placement    Consultants:  Palliative neurology    Subjective: He is alert, nonverbal, per nurse he was able to sit on the side of the bed yesterday with assistant of PT  Objective: Vitals:   10/05/22 0323 10/05/22 0405 10/05/22 0500 10/05/22 0600  BP: (!) 147/104 (!) 142/96    Pulse:      Resp: 15   19  Temp: 98.5 F (36.9 C)     TempSrc: Oral     SpO2: 94% 98%  95%  Weight:   119 kg   Height:        Intake/Output Summary (Last 24 hours) at 10/05/2022 0736 Last data filed at 10/05/2022 0400 Gross per 24 hour  Intake 2580 ml  Output 1200 ml  Net 1380 ml    Filed Weights   10/03/22 0401 10/04/22 0413 10/05/22 0500  Weight: 120.3 kg 118.9 kg 119 kg    Examination:  General exam: Alert Respiratory system: Bilateral rhonchorous, trach in place Cardiovascular system: S1, S2 regular rhythm and rate Gastrointestinal system: Sounds present, soft nontender, PEG tube in place Central nervous system: Alert , non verbal, not following command Extremities: trace  edema  Data Reviewed: I have personally reviewed following labs and imaging studies  CBC: Recent Labs  Lab 09/29/22 0458 09/30/22 0542 10/02/22 0327 10/05/22 0459  WBC 41.9* 47.5* 41.0* 35.4*  HGB 13.1 12.8* 12.8* 12.4*  HCT 40.1 39.5 39.5 39.2  MCV 92.4 93.4 93.4 93.3  PLT 182 218 165 371    Basic Metabolic Panel: Recent Labs  Lab 09/29/22 0458 10/02/22 0327 10/05/22 0459  NA 139 143 141  K 4.4 4.1 3.7  CL 101 104 103  CO2 29 27 29   GLUCOSE 126* 143* 119*  BUN 42* 28*  19  CREATININE 1.00 0.87 0.78  CALCIUM 9.9 9.5 9.4  MG  --   --  2.1    GFR: Estimated Creatinine Clearance: 119.7 mL/min (by C-G formula based on SCr of 0.78 mg/dL). Liver Function Tests: Recent Labs  Lab 09/29/22 0458  AST 29  ALT 40  ALKPHOS 72  BILITOT 0.6  PROT 7.5  ALBUMIN 3.1*    No results for input(s): "LIPASE", "AMYLASE" in the last 168 hours. No results for input(s): "AMMONIA" in the last 168 hours. Coagulation Profile: Recent Labs  Lab 09/29/22 0458  INR 1.1    Cardiac Enzymes: No results for input(s): "CKTOTAL", "CKMB", "CKMBINDEX", "TROPONINI" in the last 168 hours. BNP (last 3 results) No results for input(s): "PROBNP" in the last 8760 hours. HbA1C: No results for input(s): "HGBA1C" in the last 72 hours. CBG: Recent Labs  Lab 10/04/22 1644 10/04/22 1957 10/04/22 2344 10/05/22 0325 10/05/22 0613  GLUCAP 110* 142* 113* 124* 110*    Lipid Profile: No results for input(s): "CHOL", "HDL", "LDLCALC", "TRIG", "CHOLHDL", "LDLDIRECT" in the last 72 hours. Thyroid Function Tests: No results for input(s): "TSH", "T4TOTAL", "FREET4", "T3FREE", "THYROIDAB" in the last 72 hours. Anemia Panel: No results for input(s): "VITAMINB12", "FOLATE", "FERRITIN", "TIBC", "IRON", "RETICCTPCT" in the last 72 hours. Sepsis Labs: No results for input(s): "PROCALCITON", "LATICACIDVEN"  in the last 168 hours.  No results found for this or any previous visit (from the past 240 hour(s)).       Radiology Studies: No results found.      Scheduled Meds:  amLODipine  10 mg Per Tube Daily   arformoterol  15 mcg Nebulization BID   feeding supplement (PROSource TF20)  60 mL Per Tube BID   fiber  1 packet Per Tube BID   free water  200 mL Per Tube Q6H   heparin injection (subcutaneous)  5,000 Units Subcutaneous Q8H   insulin aspart  0-15 Units Subcutaneous Q4H   iohexol  100 mL Oral Once   lisinopril  40 mg Per Tube Daily   metoprolol tartrate  25 mg Per Tube BID    mouth rinse  15 mL Mouth Rinse 4 times per day   pantoprazole  40 mg Per Tube Daily   revefenacin  175 mcg Nebulization Daily   Continuous Infusions:   ceFAZolin (ANCEF) IV     feeding supplement (JEVITY 1.5 CAL/FIBER) 1,000 mL (10/05/22 0520)     LOS: 34 days    Time spent: 35 minutes    Dustin Braddock A Cambree Hendrix, MD [facility]   If 7PM-7AM, please contact night-coverage [email or web address]  10/05/2022, 7:36 AM

## 2022-10-05 NOTE — Progress Notes (Signed)
Patient placed on venti mask at 30% for transport.

## 2022-10-06 LAB — BASIC METABOLIC PANEL
Anion gap: 11 (ref 5–15)
BUN: 19 mg/dL (ref 8–23)
CO2: 29 mmol/L (ref 22–32)
Calcium: 9.5 mg/dL (ref 8.9–10.3)
Chloride: 101 mmol/L (ref 98–111)
Creatinine, Ser: 0.79 mg/dL (ref 0.61–1.24)
GFR, Estimated: >60 mL/min (ref >60)
Glucose, Bld: 131 mg/dL — ABNORMAL HIGH (ref 70–99)
Potassium: 3.8 mmol/L (ref 3.5–5.1)
Sodium: 141 mmol/L (ref 135–145)

## 2022-10-06 LAB — GLUCOSE, CAPILLARY
Glucose-Capillary: 111 mg/dL — ABNORMAL HIGH (ref 70–99)
Glucose-Capillary: 123 mg/dL — ABNORMAL HIGH (ref 70–99)
Glucose-Capillary: 137 mg/dL — ABNORMAL HIGH (ref 70–99)
Glucose-Capillary: 104 mg/dL — ABNORMAL HIGH (ref 70–99)
Glucose-Capillary: 110 mg/dL — ABNORMAL HIGH (ref 70–99)

## 2022-10-06 LAB — MAGNESIUM: Magnesium: 2.1 mg/dL (ref 1.7–2.4)

## 2022-10-06 MED ORDER — JEVITY 1.5 CAL/FIBER PO LIQD
1000.0000 mL | ORAL | Status: DC
  Administered 2022-10-06 – 2022-10-10 (×5): 1000 mL
  Filled 2022-10-06 (×6): qty 1000

## 2022-10-06 MED ORDER — ORAL CARE MOUTH RINSE
15.0000 mL | Freq: Two times a day (BID) | OROMUCOSAL | Status: DC
  Administered 2022-10-06 – 2022-11-09 (×67): 15 mL via OROMUCOSAL

## 2022-10-06 NOTE — Progress Notes (Signed)
Dr. [name] Crane informed of 5 beat run of Vtach, see new orders.  VSS, primary RN informed.

## 2022-10-06 NOTE — Plan of Care (Signed)
  Problem: Education: Goal: Knowledge of General Education information will improve Description: Including pain rating scale, medication(s)/side effects and non-pharmacologic comfort measures Outcome: Progressing   Problem: Health Behavior/Discharge Planning: Goal: Ability to manage health-related needs will improve Outcome: Progressing   Problem: Clinical Measurements: Goal: Ability to maintain clinical measurements within normal limits will improve Outcome: Progressing Goal: Will remain free from infection Outcome: Progressing Goal: Diagnostic test results will improve Outcome: Progressing Goal: Respiratory complications will improve Outcome: Progressing Goal: Cardiovascular complication will be avoided Outcome: Progressing   Problem: Activity: Goal: Risk for activity intolerance will decrease Outcome: Progressing   Problem: Nutrition: Goal: Adequate nutrition will be maintained Outcome: Progressing   Problem: Coping: Goal: Level of anxiety will decrease Outcome: Progressing   Problem: Elimination: Goal: Will not experience complications related to bowel motility Outcome: Progressing Goal: Will not experience complications related to urinary retention Outcome: Progressing   Problem: Pain Managment: Goal: General experience of comfort will improve Outcome: Progressing   Problem: Safety: Goal: Ability to remain free from injury will improve Outcome: Progressing   Problem: Skin Integrity: Goal: Risk for impaired skin integrity will decrease Outcome: Progressing   Problem: Education: Goal: Ability to describe self-care measures that may prevent or decrease complications (Diabetes Survival Skills Education) will improve Outcome: Progressing Goal: Individualized Educational Video(s) Outcome: Progressing   Problem: Coping: Goal: Ability to adjust to condition or change in health will improve Outcome: Progressing   Problem: Fluid Volume: Goal: Ability to  maintain a balanced intake and output will improve Outcome: Progressing   Problem: Health Behavior/Discharge Planning: Goal: Ability to identify and utilize available resources and services will improve Outcome: Progressing Goal: Ability to manage health-related needs will improve Outcome: Progressing   Problem: Metabolic: Goal: Ability to maintain appropriate glucose levels will improve Outcome: Progressing   Problem: Nutritional: Goal: Maintenance of adequate nutrition will improve Outcome: Progressing Goal: Progress toward achieving an optimal weight will improve Outcome: Progressing   Problem: Skin Integrity: Goal: Risk for impaired skin integrity will decrease Outcome: Progressing   Problem: Tissue Perfusion: Goal: Adequacy of tissue perfusion will improve Outcome: Progressing   Problem: Education: Goal: Knowledge of disease or condition will improve Outcome: Progressing Goal: Knowledge of secondary prevention will improve (SELECT ALL) Outcome: Progressing Goal: Knowledge of patient specific risk factors will improve (INDIVIDUALIZE FOR PATIENT) Outcome: Progressing Goal: Individualized Educational Video(s) Outcome: Progressing   Problem: Health Behavior/Discharge Planning: Goal: Ability to manage health-related needs will improve Outcome: Progressing   Problem: Education: Goal: Knowledge about tracheostomy care/management will improve Outcome: Progressing   Problem: Activity: Goal: Ability to tolerate increased activity will improve Outcome: Progressing   Problem: Health Behavior/Discharge Planning: Goal: Ability to manage tracheostomy will improve Outcome: Progressing   Problem: Respiratory: Goal: Patent airway maintenance will improve Outcome: Progressing   Problem: Role Relationship: Goal: Ability to communicate will improve Outcome: Progressing   Problem: Education: Goal: Knowledge of secondary prevention will improve (SELECT ALL) Outcome:  Progressing Goal: Knowledge of patient specific risk factors will improve (INDIVIDUALIZE FOR PATIENT) Outcome: Progressing   Problem: Intracerebral Hemorrhage Tissue Perfusion: Goal: Complications of Intracerebral Hemorrhage will be minimized Outcome: Progressing   

## 2022-10-06 NOTE — Progress Notes (Signed)
Palliative-   Chart reviewed- noted patient is making progress, po intake improving, participating in therapies.   Palliative will sign off for now as GOC have been set.   Please feel free to re-consult if needed.   Mariana Kaufman, AGNP-C Palliative Medicine  No charge

## 2022-10-06 NOTE — Progress Notes (Signed)
Supervising Physician: Pernell Dupre  Patient Status:  [facility] - In-pt  Chief Complaint:  ICH Patient seen today in follow up of G-tube placement 7 days ago  Subjective:  Patient reclining in bed, trach/vent dependent, eyes open, tracking with eyes, does not make effort to speak or respond when spoken to.  Does not participate in care.  Allergies: Patient has no allergy information on record.  Medications: Prior to Admission medications   Medication Sig Start Date End Date Taking? Authorizing Provider  cloNIDine (CATAPRES) 0.3 MG tablet Take 0.3 mg by mouth 2 (two) times daily. 07/07/22   [provider]  cyclobenzaprine (FLEXERIL) 10 MG tablet Take 10 mg by mouth at bedtime as needed for muscle spasms. 07/07/22   [provider]  hydrALAZINE (APRESOLINE) 100 MG tablet Take 100 mg by mouth 3 (three) times daily. 07/07/22   [provider]  lisinopril (ZESTRIL) 40 MG tablet Take 40 mg by mouth daily. 07/07/22   [provider]  meloxicam (MOBIC) 15 MG tablet Take 15 mg by mouth daily as needed for pain. 07/07/22   [provider]  simvastatin (ZOCOR) 20 MG tablet Take 20 mg by mouth at bedtime. 07/07/22   [provider]     Vital Signs: BP 132/81 (BP Location: Right Arm)   Pulse 65   Temp 99.3 F (37.4 C) (Axillary)   Resp 15   Ht 5\' 7"  (1.702 m)   Wt 262 lb 5.6 oz (119 kg)   SpO2 100%   BMI 41.09 kg/m   Physical Exam Constitutional:      General: He is awake. He is not in acute distress. Abdominal:     General: Abdomen is flat.     Palpations: Abdomen is soft.     Comments: G-tube in place, site is clean, warm, dry and intact with a piece of split gauze between bumper disc and skin.  No discharge around tract or leakage of gastric contents.   2 T-tacs in place.  Skin:    General: Skin is warm and dry.     Imaging: DG Swallowing Func-Speech Pathology  Result Date: 10/05/2022 Table formatting from the original result  was not included. Objective Swallowing Evaluation: Type of Study: MBS-Modified Barium Swallow Study  Patient Details Name: Dustin Jones MRN: [id number] Date of Birth: June 02, 1961 Today's Date: 10/05/2022 Time: SLP Start Time (ACUTE ONLY): 0910 -SLP Stop Time (ACUTE ONLY): 0930 SLP Time Calculation (min) (ACUTE ONLY): 20 min Past Medical History: No past medical history on file. Past Surgical History: Past Surgical History: Procedure Laterality Date  IR ANGIO INTRA EXTRACRAN SEL INTERNAL CAROTID BILAT MOD SED  09/02/22  IR ANGIOGRAM FOLLOW UP STUDY  September 02, 2022  IR ANGIOGRAM FOLLOW UP STUDY  09-02-2022  IR GASTROSTOMY TUBE MOD SED  09/29/2022  IR IVC FILTER PLMT / S&I /IMG GUID/MOD SED  09/29/2022  IR TRANSCATH/EMBOLIZ  09/02/2022  RADIOLOGY WITH ANESTHESIA N/A 2022/09/02  Procedure: IR WITH ANESTHESIA;  Surgeon: Lisbeth Renshaw, MD;  Location: [facility] OR;  Service: Radiology;  Laterality: N/A; HPI: Brysun Twiddy is a 61 y.o. male who is homeless, presenting 9/28 after being found down by bystanders. 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 S/P coil embolization of R ICA aneurysm 9/28. Intubated 9/29. Trach placement 10/10. Cortrak 10/11, G-tube 10/26.  No PMH on file.  Subjective: alert, f/c  Recommendations for follow up therapy are one component of a  multi-disciplinary discharge planning process, led by the attending physician.  Recommendations may be updated based on patient status, additional functional criteria and insurance authorization. Assessment / Plan / Recommendation   10/05/2022   9:36 AM Clinical Impressions Clinical Impression The study was completed with PMSV both on and off with thin liquids, but it was donned for all subsequent trials. Pt presents with mild oral phase dyspahgia characterized by impaired posterior propulsion with the 57mm barium tablet, left-sided anterior spillage with thin  liquids, and mildly prolonged mastication. Pt's swallow mechanism was otherwise WFL without evidence of penetration/aspiration. A dysphagia 3 diet with thin liquids is recommended at this time and SLP will continue to follow pt. SLP Visit Diagnosis Dysphagia, oral phase (R13.11) Impact on safety and function Mild aspiration risk     10/05/2022   9:36 AM Treatment Recommendations Treatment Recommendations Therapy as outlined in treatment plan below     10/05/2022   9:36 AM Prognosis Prognosis for Safe Diet Advancement Good Barriers to Reach Goals Cognitive deficits   10/05/2022   9:36 AM Diet Recommendations SLP Diet Recommendations Dysphagia 3 (Mech soft) solids;Thin liquid Liquid Administration via Cup;Straw Medication Administration Crushed with puree Compensations Slow rate Postural Changes Seated upright at 90 degrees     10/05/2022   9:36 AM Other Recommendations Oral Care Recommendations Oral care BID Follow Up Recommendations Skilled nursing-short term rehab (<3 hours/day) Assistance recommended at discharge Frequent or constant Supervision/Assistance Functional Status Assessment Patient has had a recent decline in their functional status and demonstrates the ability to make significant improvements in function in a reasonable and predictable amount of time.   10/05/2022   9:36 AM Frequency and Duration  Speech Therapy Frequency (ACUTE ONLY) min 2x/week Treatment Duration 2 weeks     10/05/2022   9:36 AM Oral Phase Oral Phase Impaired Oral - Thin Cup Left anterior bolus loss Oral - Thin Straw Left anterior bolus loss Oral - Puree WFL Oral - Regular Impaired mastication Oral - Pill Reduced posterior propulsion    10/05/2022   9:36 AM Pharyngeal Phase Pharyngeal Phase [facility]    10/05/2022   9:36 AM Cervical Esophageal Phase  Cervical Esophageal Phase [facility] [address]ika I. Hardin Negus, [place], [place] Office number [phone] [name] 10/05/2022, 10:18 AM                       Labs:  CBC: Recent Labs    09/29/22 0458 09/30/22 0542 10/02/22 0327 10/05/22 0459  WBC 41.9* 47.5* 41.0* 35.4*  HGB 13.1 12.8* 12.8* 12.4*  HCT 40.1 39.5 39.5 39.2  PLT 182 218 165 154    COAGS: Recent Labs    08/10/2022 0900 09/29/22 0458  INR 1.1 1.1  APTT 21*  --     BMP: Recent Labs    09/29/22 0458 10/02/22 0327 10/05/22 0459 10/06/22 0706  NA 139 143 141 141  K 4.4 4.1 3.7 3.8  CL 101 104 103 101  CO2 29 27 29 29   GLUCOSE 126* 143* 119* 131*  BUN 42* 28* 19 19  CALCIUM 9.9 9.5 9.4 9.5  CREATININE 1.00 0.87 0.78 0.79  GFRNONAA >60 >60 >60 >60    LIVER FUNCTION TESTS: Recent Labs    08/25/2022 0900 09/04/22 0150 09/06/22 0509 09/12/22 0519 09/19/22 0451 09/28/22 0538 09/29/22 0458  BILITOT 0.9 0.6  --   --  0.5  --  0.6  AST 64* 58*  --   --  42*  --  29  ALT 49* 65*  --   --  47*  --  40  ALKPHOS 74 58  --   --  56  --  72  PROT 7.6 6.4*  --   --  6.7  --  7.5  ALBUMIN 3.9 3.1*   < > 2.8* 2.7* 3.0* 3.1*   < > = values in this interval not displayed.    Assessment and Plan:  7 days s/p g-tube placement in IR -t-tacs utilized in securing device -t-tacs were removed today, on routine schedule -no issues with g-tube per RN in room or on exam. -IR to sign off, but available as needed.  Electronically Signed: Pasty Spillers, PA 10/06/2022, 10:15 AM   I spent a total of 15 Minutes at the the patient's bedside AND on the patient's hospital floor or unit, greater than 50% of which was counseling/coordinating care for g-tube care

## 2022-10-06 NOTE — Progress Notes (Signed)
Patient seen today by trach team for consult.  No education is needed at this time.  All necessary equipment is at beside.   Will continue to follow for progression.  

## 2022-10-06 NOTE — TOC Progression Note (Signed)
Transition of Care [facility]) - Progression Note    Patient Details  Name: Dustin Jones MRN: [id number] Date of Birth: [birth year]  Transition of [facility] CM/SW Contact  Pollie Friar, RN Phone Number: 10/06/2022, 12:10 PM  Clinical Narrative:    Medicaid is still pending. Pt has made progress and cleared for a diet.  TOC following for SNF placement.         Expected Discharge Plan and Services                                                 Social Determinants of Health (SDOH) Interventions    Readmission Risk Interventions     No data to display

## 2022-10-06 NOTE — Progress Notes (Signed)
Physical Therapy Treatment Patient Details Name: Dustin Jones MRN: [id number] DOB: [birth year] Today's Date: 10/06/2022   History of Present Illness The pt is a 61 y.o. unidentified homeless male presenting 9/28 after being found down by bystanders. 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 S/P coil embolization of R ICA aneurysm 9/28. Intubated 9/29. Trach placement 10/10.  Back to ICU 10/15 for respiratory failure, had IVC filter placed 10/26 and PEG.  No PMH on file.    PT Comments    Patient progressing and able to achieve goal for sitting balance and new goals set for standing as pt able to perform sit to stand today with +2 A using RW for UE support.  Patient attempting to communicate, but no PMSV in the room.  Patient fatigued with standing and needed slow removal of assist once seated due to some R lateral pushing.  Feel he remains appropriate for SNF level rehab at d/c.  PT will continue to follow acutely.   Recommendations for follow up therapy are one component of a multi-disciplinary discharge planning process, led by the attending physician.  Recommendations may be updated based on patient status, additional functional criteria and insurance authorization.  Follow Up Recommendations  Skilled nursing-short term rehab (<3 hours/day) Can patient physically be transported by private vehicle: No   Assistance Recommended at Discharge Frequent or constant Supervision/Assistance  Patient can return home with the following Two people to help with walking and/or transfers;Two people to help with bathing/dressing/bathroom;Assistance with cooking/housework;Assistance with feeding;Direct supervision/assist for medications management;Direct supervision/assist for financial management;Help with stairs or ramp for entrance;Assist for transportation   Equipment Recommendations  Other  (comment) (TBA)    Recommendations for Other Services       Precautions / Restrictions Precautions Precautions: Fall Precaution Comments: trach, PEG     Mobility  Bed Mobility Overal bed mobility: Needs Assistance Bed Mobility: Rolling, Sidelying to Sit, Sit to Supine Rolling: Max assist Sidelying to sit: Max assist, +2 for physical assistance   Sit to supine: +2 for physical assistance, Mod assist   General bed mobility comments: rolling to R pt not attending to R well so +2 A for rolling, then side to sit assist and cues for brining legs off bed and to lift trunk; back to supine assist for R leg and positioning trunk    Transfers Overall transfer level: Needs assistance Equipment used: Rolling walker (2 wheels) Transfers: Sit to/from Stand Sit to Stand: Max assist, +2 physical assistance, From elevated surface           General transfer comment: stood from tilt bed x 3 reps to RW with A for balance due to some R lateral pushing and for foot placement.  Stood with facilitation at R hip and cues to lift chest    Ambulation/Gait               General Gait Details: unable, but did perform some lateral weight shifts   Stairs             Wheelchair Mobility    Modified Rankin (Stroke Patients Only) Modified Rankin (Stroke Patients Only) Pre-Morbid Rankin Score: No symptoms Modified Rankin: Severe disability     Balance Overall balance assessment: Needs assistance Sitting-balance support: Feet supported Sitting balance-Leahy Scale: Fair Sitting balance - Comments: sitting EOB with S; after standing pushing some into support on R but when PT backed off pt able to recover  and not push anymore   Standing balance support: Bilateral upper extremity supported Standing balance-Leahy Scale: Zero Standing balance comment: +2 for standing x 3 reps, second stand for longest time about 20 seconds with cues for forward gaze and facilitation on R hip ; other  attempts, pt not able to get fully upright                            Cognition Arousal/Alertness: Awake/alert Behavior During Therapy: Flat affect Overall Cognitive Status: Impaired/Different from baseline Area of Impairment: Attention, Following commands, Problem solving                   Current Attention Level: Sustained   Following Commands: Follows one step commands with increased time, Follows one step commands inconsistently     Problem Solving: Slow processing, Decreased initiation, Requires verbal cues          Exercises General Exercises - Lower Extremity Ankle Circles/Pumps: AROM, Both, 5 reps, Supine Heel Slides: AROM, AAROM, Both, Supine, 5 reps    General Comments General comments (skin integrity, edema, etc.): on 28% trach collar with VSS during session; could not locate PMSV for attempts at communication      Pertinent Vitals/Pain Pain Assessment Pain Assessment: No/denies pain    Home Living                          Prior Function            PT Goals (current goals can now be found in the care plan section) Acute Rehab PT Goals Patient Stated Goal: unable to state PT Goal Formulation: Patient unable to participate in goal setting Time For Goal Achievement: 10/20/22 Potential to Achieve Goals: Fair Progress towards PT goals: Progressing toward goals (goals updated)    Frequency    Min 2X/week      PT Plan Current plan remains appropriate    Co-evaluation              AM-PAC PT "6 Clicks" Mobility   Outcome Measure  Help needed turning from your back to your side while in a flat bed without using bedrails?: Total Help needed moving from lying on your back to sitting on the side of a flat bed without using bedrails?: Total Help needed moving to and from a bed to a chair (including a wheelchair)?: Total Help needed standing up from a chair using your arms (e.g., wheelchair or bedside chair)?: Total Help  needed to walk in hospital room?: Total Help needed climbing 3-5 steps with a railing? : Total 6 Click Score: 6    End of Session Equipment Utilized During Treatment: Gait belt Activity Tolerance: Patient tolerated treatment well Patient left: in bed;with call bell/phone within reach Nurse Communication: Mobility status PT Visit Diagnosis: Other abnormalities of gait and mobility (R26.89);Hemiplegia and hemiparesis;Other symptoms and signs involving the nervous system (R29.898);Muscle weakness (generalized) (M62.81) Hemiplegia - Right/Left: Left Hemiplegia - dominant/non-dominant: Non-dominant Hemiplegia - caused by: Nontraumatic intracerebral hemorrhage     Time: 5638-7564 PT Time Calculation (min) (ACUTE ONLY): 31 min  Charges:  $Therapeutic Activity: 23-37 mins                     Magda Kiel, PT Acute Rehabilitation Services Office:[phone] 10/06/2022    Reginia Naas 10/06/2022, 4:50 PM

## 2022-10-06 NOTE — Progress Notes (Signed)
PROGRESS NOTE    Dustin Jones  [id number]:[id number] DOB: Jun 16, 1961 DOA: 09-25-22 PCP: Marliss Coots, NP   Brief Narrative: 61 year old with past medical history significant for hypertension, history of DVT admitted 9/28 as a code stroke, admitted to ICU with working diagnosis of intracranial hemorrhage right MCA ruptured aneurysm, has had prolonged hospitalization including multiple procedures, work-up including right ICA coil embolization of right ICA terminus aneurysm on 2022/09/25, tracheostomy by CCM on 10/10 and transferred to [facility] on 10/22.  9/28 admitted intracranial hemorrhage right MCA ruptured aneurysm status post CT head. 9/29: MRI brain large acute intraparenchymal hemorrhage extension into the right greater than left lateral ventricle, third ventricle and fourth ventricle. 9/29 developed fever and pneumonia, MSSA and respiratory culture white blood cell up to 56.  Treated. 10/15 transferred to the hospitalist service, transferred back to ICU for worsening respiratory status.  Was placed back on vent. 10/26; IVC filter and PEG tube placed by IR. 10/28: After conference call with family CODE STATUS was changed to DNR/DNI do not placed back on vent support. 11/01: Speech recommend Dysphagia 3 diet.   Assessment & Plan:   Principal Problem:   ICH (intracerebral hemorrhage) (HCC) Active Problems:   Intracranial hemorrhage (HCC)   Pressure injury of skin   Acute respiratory failure with hypoxia (HCC)   Aspiration pneumonia of both lungs (HCC)   Hypernatremia   Tracheostomy in place (HCC)   Ventilator associated pneumonia (HCC)   Leukocytosis   Fever   1-Acute Hypoxic Respiratory Failure secondary to Pneumonia Pneumonia due to MSSA and Enterobacter Status post tracheostomy in place on trach collar -He completed antibiotics for pneumonia -Continue with trach management, CCM following weekly -Continue with respiratory care suctioning and positioning. -Stable trach collar.    2-Intracranial Hemorrhage right MCA rupture aneurysmal Status post R ICA coil embolization of right ICA terminus aneurysm on 09-25-22 Continue to monitor blood pressure Continue with metoprolol, Norvasc and lisinopril Acute metabolic encephalopathy Continue with supportive care, frequent repositioning. He is follow some command today, he say "bathroom"  Dysphagia Status post PEG tube placed by IR 10/26 continue with tube feedings Speech therapy recommended dysphagia 3 diet 11/1 Plan to transition tube feeding to nocturnal.   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 Currently around 35 Needs follow-up with hematology Monitor.   Diabetes type 2 Continue  with a sliding scale insulin  Depression Thrombocytopenia: Resolved Nonsustained V. tach: Continue with metoprolol. Normal Mg Rectal tube in place: Diarrhea supportive care probably related to tube feeding Risk of malnutrition with inadequate oral intake continue with tube feedings Morbid obesity BMI 41: Oral thrush: Started  Diflucan Wound care documentation below, pressure injury also pretibial right distal stage II POA: Continue with wound care     Pressure Injury 2022-09-25 Pretibial Distal;Right;Lateral Stage 2 -  Partial thickness loss of dermis presenting as a shallow open injury with a red, pink wound bed without slough. Old healing ulcer (Active)  2022/09/25 1630  Location: Pretibial  Location Orientation: Distal;Right;Lateral  Staging: Stage 2 -  Partial thickness loss of dermis presenting as a shallow open injury with a red, pink wound bed without slough.  Wound Description (Comments): Old healing ulcer  Present on Admission: Yes  Dressing Type None 10/06/22 1300      Nutrition  Problem: Inadequate oral intake Etiology: inability to eat    Signs/Symptoms: NPO status    Interventions: Prostat, Tube feeding  Estimated body mass index is 41.09 kg/m as calculated from the following:   Height as of this encounter: 5\' 7"  (1.702 m).   Weight as of this encounter: 119 kg.   DVT prophylaxis: heparin  Code Status: DNR/DNI Family Communication:no family at bedside.  Disposition Plan:  Status is: Inpatient Remains inpatient appropriate because: needs placement    Consultants:  Palliative neurology    Subjective: He is alert, he is trying to move left hand. He moves right arm and right leg passively.  He said "Bathroom"  He has been eating.   Objective: Vitals:   10/06/22 1131 10/06/22 1153 10/06/22 1338 10/06/22 1539  BP: 128/85 (!) 140/97 (!) 135/97   Pulse: 85 92    Resp: 20 (!) 21 15   Temp: 99 F (37.2 C)     TempSrc: Axillary     SpO2: 99% 100% 96% 98%  Weight:      Height:        Intake/Output Summary (Last 24 hours) at 10/06/2022 1540 Last data filed at 10/06/2022 1428 Gross per 24 hour  Intake 4060 ml  Output 1900 ml  Net 2160 ml    Filed Weights   10/03/22 0401 10/04/22 0413 10/05/22 0500  Weight: 120.3 kg 118.9 kg 119 kg    Examination:  General exam: Alert Respiratory system: Trach in place, BL ronchus.  Cardiovascular system: S 1, S 2 RRR Gastrointestinal system: BS present, soft, nt PEG tube in place Central nervous system: Alert, follows some command, left side hemiparesis.  Extremities: trace  edema  Data Reviewed: I have personally reviewed following labs and imaging studies  CBC: Recent Labs  Lab 09/30/22 0542 10/02/22 0327 10/05/22 0459  WBC 47.5* 41.0* 35.4*  HGB 12.8* 12.8* 12.4*  HCT 39.5 39.5 39.2  MCV 93.4 93.4 93.3  PLT 218 165 [id number]    Basic Metabolic Panel: Recent Labs  Lab 10/02/22 0327 10/05/22 0459 10/06/22 0706  NA 143 141 141  K 4.1 3.7 3.8  CL 104 103 101  CO2  27 29 29   GLUCOSE 143* 119* 131*  BUN 28* 19 19  CREATININE 0.87 0.78 0.79  CALCIUM 9.5 9.4 9.5  MG  --  2.1 2.1    GFR: Estimated Creatinine Clearance: 119.7 mL/min (by C-G formula based on SCr of 0.79 mg/dL). Liver Function Tests: No results for input(s): "AST", "ALT", "ALKPHOS", "BILITOT", "PROT", "ALBUMIN" in the last 168 hours.  No results for input(s): "LIPASE", "AMYLASE" in the last 168 hours. No results for input(s): "AMMONIA" in the last 168 hours. Coagulation Profile: No results for input(s): "INR", "PROTIME" in the last 168 hours.  Cardiac Enzymes: No results for input(s): "CKTOTAL", "CKMB", "CKMBINDEX", "TROPONINI" in the last 168 hours. BNP (last 3 results) No results for input(s): "PROBNP" in the last 8760 hours. HbA1C: No results for input(s): "HGBA1C" in the last 72 hours. CBG: Recent Labs  Lab 10/05/22 2034 10/05/22 2355 10/06/22 0346 10/06/22 0632 10/06/22 1134  GLUCAP 140* 128* 111* 123* 137*    Lipid Profile: No results for input(s): "CHOL", "HDL", "LDLCALC", "TRIG", "CHOLHDL", "LDLDIRECT" in the last 72 hours. Thyroid Function Tests: No results for input(s): "TSH", "T4TOTAL", "FREET4", "T3FREE", "THYROIDAB" in the last 72 hours. Anemia Panel: No results for input(s): "VITAMINB12", "FOLATE", "FERRITIN", "TIBC", "IRON", "RETICCTPCT" in the last 72 hours. Sepsis Labs: No results for input(s): "PROCALCITON", "LATICACIDVEN" in the  last 168 hours.  No results found for this or any previous visit (from the past 240 hour(s)).       Radiology Studies: DG Swallowing Func-Speech Pathology  Result Date: 10/05/2022 Table formatting from the original result was not included. Objective Swallowing Evaluation: Type of Study: MBS-Modified Barium Swallow Study  Patient Details Name: Dustin Jones MRN: [id number] Date of Birth: Jan 01, 1961 Today's Date: 10/05/2022 Time: SLP Start Time (ACUTE ONLY): 0910 -SLP Stop Time (ACUTE ONLY): 0930 SLP Time Calculation (min)  (ACUTE ONLY): 20 min Past Medical History: No past medical history on file. Past Surgical History: Past Surgical History: Procedure Laterality Date  IR ANGIO INTRA EXTRACRAN SEL INTERNAL CAROTID BILAT MOD SED  08/11/2022  IR ANGIOGRAM FOLLOW UP STUDY  08/07/2022  IR ANGIOGRAM FOLLOW UP STUDY  08/28/2022  IR GASTROSTOMY TUBE MOD SED  09/29/2022  IR IVC FILTER PLMT / S&I /IMG GUID/MOD SED  09/29/2022  IR TRANSCATH/EMBOLIZ  08/20/2022  RADIOLOGY WITH ANESTHESIA N/A 08/20/2022  Procedure: IR WITH ANESTHESIA;  Surgeon: Consuella Lose, MD;  Location: [facility];  Service: Radiology;  Laterality: N/A; HPI: Dustin Jones is a 61 y.o. male who is homeless, presenting 9/28 after being found down by bystanders. 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 S/P coil embolization of R ICA aneurysm 9/28. Intubated 9/29. Trach placement 10/10. Cortrak 10/11, G-tube 10/26.  No PMH on file.  Subjective: alert, f/c  Recommendations for follow up therapy are one component of a multi-disciplinary discharge planning process, led by the attending physician.  Recommendations may be updated based on patient status, additional functional criteria and insurance authorization. Assessment / Plan / Recommendation   10/05/2022   9:36 AM Clinical Impressions Clinical Impression The study was completed with PMSV both on and off with thin liquids, but it was donned for all subsequent trials. Pt presents with mild oral phase dyspahgia characterized by impaired posterior propulsion with the 77mm barium tablet, left-sided anterior spillage with thin liquids, and mildly prolonged mastication. Pt's swallow mechanism was otherwise WFL without evidence of penetration/aspiration. A dysphagia 3 diet with thin liquids is recommended at this time and SLP will continue to follow pt. SLP Visit Diagnosis Dysphagia, oral phase (R13.11) Impact on safety and  function Mild aspiration risk     10/05/2022   9:36 AM Treatment Recommendations Treatment Recommendations Therapy as outlined in treatment plan below     10/05/2022   9:36 AM Prognosis Prognosis for Safe Diet Advancement Good Barriers to Reach Goals Cognitive deficits   10/05/2022   9:36 AM Diet Recommendations SLP Diet Recommendations Dysphagia 3 (Mech soft) solids;Thin liquid Liquid Administration via Cup;Straw Medication Administration Crushed with puree Compensations Slow rate Postural Changes Seated upright at 90 degrees     10/05/2022   9:36 AM Other Recommendations Oral Care Recommendations Oral care BID Follow Up Recommendations Skilled nursing-short term rehab (<3 hours/day) Assistance recommended at discharge Frequent or constant Supervision/Assistance Functional Status Assessment Patient has had a recent decline in their functional status and demonstrates the ability to make significant improvements in function in a reasonable and predictable amount of time.   10/05/2022   9:36 AM Frequency and Duration  Speech Therapy Frequency (ACUTE ONLY) min 2x/week Treatment Duration 2 weeks     10/05/2022   9:36 AM Oral Phase Oral Phase Impaired Oral - Thin Cup Left anterior bolus loss Oral - Thin Straw Left anterior bolus loss Oral - Puree WFL Oral -  Regular Impaired mastication Oral - Pill Reduced posterior propulsion    10/05/2022   9:36 AM Pharyngeal Phase Pharyngeal Phase [facility]    10/05/2022   9:36 AM Cervical Esophageal Phase  Cervical Esophageal Phase [facility] Shanika I. Hardin Negus, [place], [place] Office number [phone] [name] 10/05/2022, 10:18 AM                          Scheduled Meds:  amLODipine  10 mg Per Tube Daily   arformoterol  15 mcg Nebulization BID   famotidine  20 mg Per Tube Daily   feeding supplement (JEVITY 1.5 CAL/FIBER)  1,000 mL Per Tube Q24H   fiber  1 packet Per Tube BID   fluconazole  100 mg Oral Daily   free water  200 mL Per Tube Q6H   heparin  injection (subcutaneous)  5,000 Units Subcutaneous Q8H   hydrALAZINE  10 mg Oral BID   insulin aspart  0-15 Units Subcutaneous Q4H   lisinopril  40 mg Per Tube Daily   metoprolol tartrate  25 mg Per Tube BID   mouth rinse  15 mL Mouth Rinse BID   revefenacin  175 mcg Nebulization Daily   saccharomyces boulardii  250 mg Oral BID   Continuous Infusions:   ceFAZolin (ANCEF) IV       LOS: 35 days    Time spent: 35 minutes    Dustin Jones A Trevelle Mcgurn, MD [facility]   If 7PM-7AM, please contact night-coverage [email or web address]  10/06/2022, 3:40 PM

## 2022-10-07 LAB — GLUCOSE, CAPILLARY
Glucose-Capillary: 103 mg/dL — ABNORMAL HIGH (ref 70–99)
Glucose-Capillary: 115 mg/dL — ABNORMAL HIGH (ref 70–99)
Glucose-Capillary: 124 mg/dL — ABNORMAL HIGH (ref 70–99)
Glucose-Capillary: 144 mg/dL — ABNORMAL HIGH (ref 70–99)
Glucose-Capillary: 98 mg/dL (ref 70–99)

## 2022-10-07 LAB — BASIC METABOLIC PANEL
Anion gap: 8 (ref 5–15)
BUN: 14 mg/dL (ref 8–23)
CO2: 28 mmol/L (ref 22–32)
Calcium: 9 mg/dL (ref 8.9–10.3)
Chloride: 101 mmol/L (ref 98–111)
Creatinine, Ser: 0.78 mg/dL (ref 0.61–1.24)
GFR, Estimated: >60 mL/min (ref >60)
Glucose, Bld: 135 mg/dL — ABNORMAL HIGH (ref 70–99)
Potassium: 3.4 mmol/L — ABNORMAL LOW (ref 3.5–5.1)
Sodium: 137 mmol/L (ref 135–145)

## 2022-10-07 LAB — CBC
HCT: 38.8 % — ABNORMAL LOW (ref 39.0–52.0)
Hemoglobin: 12.1 g/dL — ABNORMAL LOW (ref 13.0–17.0)
MCH: 29.5 pg (ref 26.0–34.0)
MCHC: 31.2 g/dL (ref 30.0–36.0)
MCV: 94.6 fL (ref 80.0–100.0)
Platelets: 145 10*3/uL — ABNORMAL LOW (ref 150–400)
RBC: 4.1 MIL/uL — ABNORMAL LOW (ref 4.22–5.81)
RDW: 12.4 % (ref 11.5–15.5)
WBC: 33.7 10*3/uL — ABNORMAL HIGH (ref 4.0–10.5)
nRBC: 0 % (ref 0.0–0.2)

## 2022-10-07 LAB — MAGNESIUM: Magnesium: 1.8 mg/dL (ref 1.7–2.4)

## 2022-10-07 MED ORDER — POTASSIUM CHLORIDE 20 MEQ PO PACK
40.0000 meq | PACK | Freq: Once | ORAL | Status: AC
  Administered 2022-10-07: 40 meq via ORAL
  Filled 2022-10-07: qty 2

## 2022-10-07 NOTE — Progress Notes (Signed)
Mobility Specialist: Progress Note   10/07/22 1248  Mobility  Activity  (Tilt progression)  Level of Assistance +2 (takes two people)  Assistive Device  (Tilt Bed)  Activity Response Tolerated well  Mobility Referral Yes  $Mobility charge 1 Mobility   Tilt Bed Progression Degrees Minutes BP  20 1   30 2    40 2   45                 Pt received in the bed and agreeable to tilt session with help from NT. No c/o throughout tilt progression. When tilting past 40 degrees pt's feet began sliding towards the edge of the footboard so had to lower pt back down. Pt repositioned in the bed with call bell in his lap.   [place] Charda Janis Mobility Specialist Secure Chat Only

## 2022-10-07 NOTE — Evaluation (Signed)
Occupational Therapy Evaluation Patient Details Name: Dustin Jones MRN: [id number] DOB: Oct 06, 1961 Today's Date: 10/07/2022   History of Present Illness The pt is a 61 y.o. unidentified homeless male presenting 9/28 after being found down by bystanders. 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 S/P coil embolization of R ICA aneurysm 9/28. Intubated 9/29. Trach placement 10/10.  Back to ICU 10/15 for respiratory failure, had IVC filter placed 10/26 and PEG.  No PMH on file.   Clinical Impression   Pt arousing to name. Pt alert throughout session and attempting to follow ~90% of simple commands in minimally distracting environment, and ~50% of commands with TV on. Pt with 1-2 attempts to verbalize this session. Pt performing UB ADL with max A and LB ADL with total A at this time. Performing bed mobility with Mod A for rolling toward L, but max A +2 for rolling R due to decreased attention to R side. Pulling up into long sit with HOB elevated with max A +2 to bring shoulders off of bed. Total A for toileting this session. Pt limited by decreased activity tolerance, strength, balance, attention to R side, communication, and cognition. Recommending SNF for continued OT services.      Recommendations for follow up therapy are one component of a multi-disciplinary discharge planning process, led by the attending physician.  Recommendations may be updated based on patient status, additional functional criteria and insurance authorization.   Follow Up Recommendations  Skilled nursing-short term rehab (<3 hours/day)    Assistance Recommended at Discharge Frequent or constant Supervision/Assistance  Patient can return home with the following Two people to help with walking and/or transfers;Two people to help with bathing/dressing/bathroom;Help with stairs or ramp for entrance;Assist for  transportation;Direct supervision/assist for financial management;Direct supervision/assist for medications management;Assistance with cooking/housework    Functional Status Assessment  Patient has had a recent decline in their functional status and/or demonstrates limited ability to make significant improvements in function in a reasonable and predictable amount of time  Equipment Recommendations  Other (comment) (defer to next venue)    Recommendations for Other Services       Precautions / Restrictions Precautions Precautions: Fall Precaution Comments: trach, PEG Restrictions Weight Bearing Restrictions: No      Mobility Bed Mobility Overal bed mobility: Needs Assistance Bed Mobility: Rolling Rolling: Mod assist, +2 for physical assistance, Max assist         General bed mobility comments: Mod A to roll onto L side, Max A+2 to roll toward R due to poor attention to R side    Transfers                   General transfer comment: deferred due to focus session on personal care      Balance       Sitting balance - Comments: Pulling up into long sit with HOB elevated with max A +2.                                   ADL either performed or assessed with clinical judgement   ADL Overall ADL's : Needs assistance/impaired     Grooming: Maximal assistance;Moderate assistance;Bed level Grooming Details (indicate cue type and reason): Initially mod A to wash face with LUE, but ultimately requiring max A due to decreased endurance. Upper Body Bathing: Maximal assistance;Bed level  Lower Body Bathing: Maximal assistance;+2 for physical assistance;+2 for safety/equipment;Bed level   Upper Body Dressing : Maximal assistance;Bed level   Lower Body Dressing: Maximal assistance;+2 for physical assistance;+2 for safety/equipment;Bed level   Toilet Transfer: Maximal assistance;Total assistance;+2 for physical assistance;+2 for safety/equipment    Toileting- Clothing Manipulation and Hygiene: Total assistance;+2 for safety/equipment;+2 for physical assistance;Bed level Toileting - Clothing Manipulation Details (indicate cue type and reason): soiled on arrival. Total A for pericare     Functional mobility during ADLs: Maximal assistance;+2 for physical assistance;+2 for safety/equipment       Vision Baseline Vision/History:  (unsure) Patient Visual Report: No change from baseline (Pt denying visual changes) Vision Assessment?: Vision impaired- to be further tested in functional context Additional Comments: decreased attention to R. Pt denies vision changes, but max difficulty tracking on command.     Perception     Praxis      Pertinent Vitals/Pain Pain Assessment Pain Assessment: No/denies pain (shaking head no when asked if in pain)     Hand Dominance Left   Extremity/Trunk Assessment Upper Extremity Assessment Upper Extremity Assessment: Generalized weakness (3+/5 grip strength; 2+/5 BUE. able to initiate movement against gravity, unable to reach face)   Lower Extremity Assessment Lower Extremity Assessment: Defer to PT evaluation   Cervical / Trunk Assessment Cervical / Trunk Assessment: Other exceptions Cervical / Trunk Exceptions: increased body habitus   Communication Communication Communication: Tracheostomy   Cognition Arousal/Alertness: Awake/alert Behavior During Therapy: Flat affect Overall Cognitive Status: Impaired/Different from baseline Area of Impairment: Attention, Following commands, Problem solving, Safety/judgement, Awareness                   Current Attention Level: Sustained (Decr attention to R)   Following Commands: Follows one step commands with increased time, Follows one step commands inconsistently Safety/Judgement: Decreased awareness of safety, Decreased awareness of deficits Awareness:  (decreased awareness of R side of body and environment, but will move R hand on  command with increased time and cues. Unaware of bowel movement) Problem Solving: Slow processing, Decreased initiation, Requires verbal cues General Comments: pt responding to some questions this session, follow commands with UE/LE movement bilaterally. Max decreased attention to R side of environment. Some awareness of R side when asked to pull back up off of bed, reaching out with R hand but unable to bring full attention to R requiring therapist to place hand. However, pt did attempt.     General Comments       Exercises     Shoulder Instructions      Home Living Family/patient expects to be discharged to:: Shelter/Homeless                                 Additional Comments: Per chart, pt is homeless      Prior Functioning/Environment Prior Level of Function : Other (comment) (Pt unable to verbalize at this time. Will attempt to contact family.)             Mobility Comments: Unable to verbalize and pt with limited attempts to do so despite encouragemet. No family present. Per chart, pt was homeless. Assuming pt was likely IND-modI.          OT Problem List: Decreased strength;Decreased range of motion;Decreased cognition;Cardiopulmonary status limiting activity;Obesity;Impaired UE functional use      OT Treatment/Interventions: Self-care/ADL training;Therapeutic exercise;Balance training;Patient/family education;Cognitive remediation/compensation;DME and/or AE instruction    OT Goals(Current  goals can be found in the care plan section) Acute Rehab OT Goals Patient Stated Goal: none stated OT Goal Formulation: With patient Time For Goal Achievement: 10/21/22 Potential to Achieve Goals: Fair  OT Frequency: Min 2X/week    Co-evaluation              AM-PAC OT "6 Clicks" Daily Activity     Outcome Measure Help from another person eating meals?: Total Help from another person taking care of personal grooming?: A Lot Help from another person  toileting, which includes using toliet, bedpan, or urinal?: Total Help from another person bathing (including washing, rinsing, drying)?: A Lot Help from another person to put on and taking off regular upper body clothing?: A Lot Help from another person to put on and taking off regular lower body clothing?: Total 6 Click Score: 9   End of Session Equipment Utilized During Treatment: Oxygen Nurse Communication: Mobility status  Activity Tolerance: Patient limited by fatigue Patient left: in bed;with call bell/phone within reach;with bed alarm set  OT Visit Diagnosis: Muscle weakness (generalized) (M62.81);Other symptoms and signs involving the nervous system (R29.898);Other symptoms and signs involving cognitive function                Time: 1500-1530 OT Time Calculation (min): 30 min Charges:  OT General Charges $OT Visit: 1 Visit OT Evaluation $OT Eval Moderate Complexity: 1 Mod  Ladene Artist, OTR/L [facility] Acute Rehabilitation Office: [phone]  Drue Novel 10/07/2022, 4:31 PM

## 2022-10-07 NOTE — Progress Notes (Addendum)
PROGRESS NOTE    Dustin Jones  [id number] DOB: 10-16-1961 DOA: 08/29/2022 PCP: Marliss Coots, NP   Brief Narrative: 61 year old with past medical history significant for hypertension, history of DVT admitted 9/28 as a code stroke, admitted to ICU with working diagnosis of intracranial hemorrhage right MCA ruptured aneurysm, has had prolonged hospitalization including multiple procedures, work-up including right ICA coil embolization of right ICA terminus aneurysm on 08/22/2022, tracheostomy by CCM on 10/10 and transferred to [facility] on 10/22.  9/28 admitted intracranial hemorrhage right MCA ruptured aneurysm status post CT head. 9/29: MRI brain large acute intraparenchymal hemorrhage extension into the right greater than left lateral ventricle, third ventricle and fourth ventricle. 9/29 developed fever and pneumonia, MSSA and respiratory culture white blood cell up to 56.  Treated. 10/15 transferred to the hospitalist service, transferred back to ICU for worsening respiratory status.  Was placed back on vent. 10/26; IVC filter and PEG tube placed by IR. 10/28: After conference call with family CODE STATUS was changed to DNR/DNI do not placed back on vent support. 11/01: Speech recommend Dysphagia 3 diet.   Assessment & Plan:   Principal Problem:   ICH (intracerebral hemorrhage) (HCC) Active Problems:   Intracranial hemorrhage (HCC)   Pressure injury of skin   Acute respiratory failure with hypoxia (HCC)   Aspiration pneumonia of both lungs (HCC)   Hypernatremia   Tracheostomy in place (HCC)   Ventilator associated pneumonia (HCC)   Leukocytosis   Fever   1-Acute Hypoxic Respiratory Failure secondary to Pneumonia Pneumonia due to MSSA and Enterobacter Status post tracheostomy in place on trach collar -He completed antibiotics for pneumonia. -Continue with trach management, CCM following weekly -Continue with respiratory care suctioning and positioning. -Stable trach collar.    2-Intracranial Hemorrhage right MCA rupture aneurysmal Status post R ICA coil embolization of right ICA terminus aneurysm on 08/31/2022 Continue to monitor blood pressure Continue with metoprolol, Norvasc and lisinopril Acute metabolic encephalopathy Continue with supportive care, frequent repositioning. He has make some improvement, he is following some command, he is able to moves right side, left side hemiparesis, he was able to moves left finger and left hand slightly.   Dysphagia Status post PEG tube placed by IR 10/26 continue with tube feedings Speech therapy recommended dysphagia 3 diet 11/1 Plan to transition tube feeding to nocturnal.   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 Currently around 33 Needs follow-up with hematology Monitor.   Diabetes type 2 Continue  with a sliding scale insulin  Depression Thrombocytopenia: Resolved Nonsustained V. tach: Continue with metoprolol. Normal Mg Rectal tube in place: Diarrhea supportive care probably related to tube feeding Risk of malnutrition with inadequate oral intake continue with tube feedings Morbid obesity BMI 41: Oral thrush: Started  Diflucan Wound care documentation below, pressure injury also pretibial right distal stage II POA: Continue with wound care Hypokalemia; replete orally.     Pressure Injury 08/15/2022 Pretibial Distal;Right;Lateral Stage 2 -  Partial thickness loss of dermis presenting as a shallow open injury with a red, pink wound bed without slough. Old healing ulcer (Active)  08/31/2022 1630  Location: Pretibial  Location Orientation: Distal;Right;Lateral  Staging: Stage 2 -  Partial thickness loss of dermis presenting as a shallow open injury with a red, pink  wound  bed without slough.  Wound Description (Comments): Old healing ulcer  Present on Admission: Yes  Dressing Type None 10/07/22 0744     Nutrition Problem: Inadequate oral intake Etiology: inability to eat    Signs/Symptoms: NPO status    Interventions: Prostat, Tube feeding  Estimated body mass index is 41.09 kg/m as calculated from the following:   Height as of this encounter: 5\' 7"  (1.702 m).   Weight as of this encounter: 119 kg.   DVT prophylaxis: heparin  Code Status: DNR/DNI Family Communication:no family at bedside. Daughter updated 11/02 Disposition Plan:  Status is: Inpatient Remains inpatient appropriate because: needs placement    Consultants:  Palliative neurology    Subjective: He is alert, started to follow some command, said one word. Less secretions through trach.   Objective: Vitals:   10/07/22 0744 10/07/22 1023 10/07/22 1153 10/07/22 1251  BP: (!) 141/94  (!) 141/108 (!) 150/90  Pulse: 78 65 68 76  Resp: 13 16 17 18   Temp: 98.2 F (36.8 C)  98.7 F (37.1 C) 98.9 F (37.2 C)  TempSrc: Axillary  Axillary Oral  SpO2: 100% 97% 92% 100%  Weight:      Height:        Intake/Output Summary (Last 24 hours) at 10/07/2022 1404 Last data filed at 10/07/2022 1230 Gross per 24 hour  Intake 730 ml  Output 850 ml  Net -120 ml    Filed Weights   10/03/22 0401 10/04/22 0413 10/05/22 0500  Weight: 120.3 kg 118.9 kg 119 kg    Examination:  General exam: Obese, Alert.  Respiratory system: Trach collar. BL ronchus Cardiovascular system: S 1, S 2 RRR Gastrointestinal system: BS present, soft, nt, PEG tube in place Central nervous system: Alert, follows some command, left side hemiparesis.  Extremities: trace  edema  Data Reviewed: I have personally reviewed following labs and imaging studies  CBC: Recent Labs  Lab 10/02/22 0327 10/05/22 0459 10/07/22 0332  WBC 41.0* 35.4* 33.7*  HGB 12.8* 12.4* 12.1*  HCT 39.5 39.2 38.8*  MCV  93.4 93.3 94.6  PLT 165 154 145*    Basic Metabolic Panel: Recent Labs  Lab 10/02/22 0327 10/05/22 0459 10/06/22 0706 10/07/22 0332  NA 143 141 141 137  K 4.1 3.7 3.8 3.4*  CL 104 103 101 101  CO2 27 29 29 28   GLUCOSE 143* 119* 131* 135*  BUN 28* 19 19 14   CREATININE 0.87 0.78 0.79 0.78  CALCIUM 9.5 9.4 9.5 9.0  MG  --  2.1 2.1 1.8    GFR: Estimated Creatinine Clearance: 119.7 mL/min (by C-G formula based on SCr of 0.78 mg/dL). Liver Function Tests: No results for input(s): "AST", "ALT", "ALKPHOS", "BILITOT", "PROT", "ALBUMIN" in the last 168 hours.  No results for input(s): "LIPASE", "AMYLASE" in the last 168 hours. No results for input(s): "AMMONIA" in the last 168 hours. Coagulation Profile: No results for input(s): "INR", "PROTIME" in the last 168 hours.  Cardiac Enzymes: No results for input(s): "CKTOTAL", "CKMB", "CKMBINDEX", "TROPONINI" in the last 168 hours. BNP (last 3 results) No results for input(s): "PROBNP" in the last 8760 hours. HbA1C: No results for input(s): "HGBA1C" in the last 72 hours. CBG: Recent Labs  Lab 10/06/22 2212 10/07/22 0031 10/07/22 0410 10/07/22 0755 10/07/22 1245  GLUCAP 110* 124* 115* 144* 98    Lipid Profile: No results for input(s): "CHOL", "HDL", "LDLCALC", "TRIG", "CHOLHDL", "LDLDIRECT" in the last 72 hours. Thyroid Function Tests: No results for input(s): "TSH", "T4TOTAL", "FREET4", "T3FREE", "  THYROIDAB" in the last 72 hours. Anemia Panel: No results for input(s): "VITAMINB12", "FOLATE", "FERRITIN", "TIBC", "IRON", "RETICCTPCT" in the last 72 hours. Sepsis Labs: No results for input(s): "PROCALCITON", "LATICACIDVEN" in the last 168 hours.  No results found for this or any previous visit (from the past 240 hour(s)).       Radiology Studies: No results found.      Scheduled Meds:  amLODipine  10 mg Per Tube Daily   arformoterol  15 mcg Nebulization BID   famotidine  20 mg Per Tube Daily   feeding  supplement (JEVITY 1.5 CAL/FIBER)  1,000 mL Per Tube Q24H   fiber  1 packet Per Tube BID   fluconazole  100 mg Oral Daily   free water  200 mL Per Tube Q6H   heparin injection (subcutaneous)  5,000 Units Subcutaneous Q8H   hydrALAZINE  10 mg Oral BID   insulin aspart  0-15 Units Subcutaneous Q4H   lisinopril  40 mg Per Tube Daily   metoprolol tartrate  25 mg Per Tube BID   mouth rinse  15 mL Mouth Rinse BID   revefenacin  175 mcg Nebulization Daily   saccharomyces boulardii  250 mg Oral BID   Continuous Infusions:   ceFAZolin (ANCEF) IV       LOS: 36 days    Time spent: 35 minutes    Boleslaw Borghi A Khyron Garno, MD [facility]   If 7PM-7AM, please contact night-coverage [email or web address]  10/07/2022, 2:04 PM

## 2022-10-08 LAB — GLUCOSE, CAPILLARY
Glucose-Capillary: 105 mg/dL — ABNORMAL HIGH (ref 70–99)
Glucose-Capillary: 106 mg/dL — ABNORMAL HIGH (ref 70–99)
Glucose-Capillary: 109 mg/dL — ABNORMAL HIGH (ref 70–99)
Glucose-Capillary: 110 mg/dL — ABNORMAL HIGH (ref 70–99)
Glucose-Capillary: 112 mg/dL — ABNORMAL HIGH (ref 70–99)
Glucose-Capillary: 125 mg/dL — ABNORMAL HIGH (ref 70–99)
Glucose-Capillary: 93 mg/dL (ref 70–99)

## 2022-10-08 LAB — BASIC METABOLIC PANEL WITH GFR
Anion gap: 9 (ref 5–15)
BUN: 8 mg/dL (ref 8–23)
CO2: 27 mmol/L (ref 22–32)
Calcium: 9.3 mg/dL (ref 8.9–10.3)
Chloride: 102 mmol/L (ref 98–111)
Creatinine, Ser: 0.63 mg/dL (ref 0.61–1.24)
GFR, Estimated: >60 mL/min
Glucose, Bld: 112 mg/dL — ABNORMAL HIGH (ref 70–99)
Potassium: 3.9 mmol/L (ref 3.5–5.1)
Sodium: 138 mmol/L (ref 135–145)

## 2022-10-08 LAB — MAGNESIUM: Magnesium: 2 mg/dL (ref 1.7–2.4)

## 2022-10-08 NOTE — Progress Notes (Signed)
PROGRESS NOTE    Dustin Jones  [id number] DOB: [birth year] DOA: 09/03/2022 PCP: Marliss Coots, NP   Brief Narrative: 61 year old with past medical history significant for hypertension, history of DVT admitted 9/28 as a code stroke, admitted to ICU with working diagnosis of intracranial hemorrhage right MCA ruptured aneurysm, has had prolonged hospitalization including multiple procedures, work-up including right ICA coil embolization of right ICA terminus aneurysm on 08/16/2022, tracheostomy by CCM on 10/10 and transferred to [facility] on 10/22.  9/28 admitted intracranial hemorrhage right MCA ruptured aneurysm status post CT head. 9/29: MRI brain large acute intraparenchymal hemorrhage extension into the right greater than left lateral ventricle, third ventricle and fourth ventricle. 9/29 developed fever and pneumonia, MSSA and respiratory culture white blood cell up to 56.  Treated. 10/15 transferred to the hospitalist service, transferred back to ICU for worsening respiratory status.  Was placed back on vent. 10/26; IVC filter and PEG tube placed by IR. 10/28: After conference call with family CODE STATUS was changed to DNR/DNI do not placed back on vent support. 11/01: Speech recommend Dysphagia 3 diet.   Assessment & Plan:   Principal Problem:   ICH (intracerebral hemorrhage) (HCC) Active Problems:   Intracranial hemorrhage (HCC)   Pressure injury of skin   Acute respiratory failure with hypoxia (HCC)   Aspiration pneumonia of both lungs (HCC)   Hypernatremia   Tracheostomy in place (HCC)   Ventilator associated pneumonia (HCC)   Leukocytosis   Fever   1-Acute Hypoxic Respiratory Failure secondary to Pneumonia: Pneumonia due to MSSA and Enterobacter: Status post tracheostomy in place on trach collar: -He completed antibiotics for pneumonia. -Continue with trach management, CCM following weekly -Continue with respiratory care suctioning and positioning. -Stable trach  collar.   2-Intracranial Hemorrhage right MCA rupture aneurysmal Status post R ICA coil embolization of right ICA terminus aneurysm on 08/21/2022 Continue to monitor blood pressure Continue with metoprolol, Norvasc and lisinopril Acute metabolic encephalopathy Continue with supportive care, frequent repositioning. He has make some improvement, he is following some command, he is able to moves right side, left side hemiparesis, he was able to moves left finger and left hand slightly.  Speech to follow for language , Pauci valve.    Dysphagia Status post PEG tube placed by IR 10/26 continue with tube feedings Speech therapy recommended dysphagia 3 diet 11/1 Plan to transition tube feeding to nocturnal.   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 Currently around 33 Needs follow-up with hematology Monitor.   Diabetes type 2 Continue  with a sliding scale insulin  Depression Thrombocytopenia: Resolved Nonsustained V. tach: Continue with metoprolol. Normal Mg Rectal tube in place: Diarrhea supportive care probably related to tube feeding Risk of malnutrition with inadequate oral intake continue with tube feedings Morbid obesity BMI 41: Oral thrush: Started  Diflucan Wound care documentation below, pressure injury also pretibial right distal stage II POA: Continue with wound care Hypokalemia; replete orally.     Pressure Injury 08/26/2022 Pretibial Distal;Right;Lateral Stage 2 -  Partial thickness loss of dermis presenting as a shallow open injury with a red, pink wound bed without slough. Old healing ulcer (Active)  08/10/2022 1630  Location: Pretibial  Location Orientation: Distal;Right;Lateral  Staging: Stage 2 -  Partial thickness loss  of  dermis presenting as a shallow open injury with a red, pink wound bed without slough.  Wound Description (Comments): Old healing ulcer  Present on Admission: Yes  Dressing Type None 10/08/22 0451     Nutrition Problem: Inadequate oral intake Etiology: inability to eat    Signs/Symptoms: NPO status    Interventions: Prostat, Tube feeding  Estimated body mass index is 41.09 kg/m as calculated from the following:   Height as of this encounter: 5\' 7"  (1.702 m).   Weight as of this encounter: 119 kg.   DVT prophylaxis: heparin  Code Status: DNR/DNI Family Communication:no family at bedside. Daughter updated 11/02 Disposition Plan:  Status is: Inpatient Remains inpatient appropriate because: needs placement    Consultants:  Palliative neurology    Subjective: He is alert, follows command, denies pain.   Objective: Vitals:   10/08/22 0001 10/08/22 0403 10/08/22 0408 10/08/22 0415  BP:   (!) 169/99 111/73  Pulse: 78 89 64 66  Resp: 18 16 16 17   Temp:    (!) 97.4 F (36.3 C)  TempSrc:    Axillary  SpO2: 99% 96% 97% 95%  Weight:      Height:        Intake/Output Summary (Last 24 hours) at 10/08/2022 0715 Last data filed at 10/08/2022 0600 Gross per 24 hour  Intake 820 ml  Output --  Net 820 ml    Filed Weights   10/03/22 0401 10/04/22 0413 10/05/22 0500  Weight: 120.3 kg 118.9 kg 119 kg    Examination:  General exam: Obese alert Respiratory system: Trach collar.  Cardiovascular system: S 1, S 2 RRR Gastrointestinal system: BS present, soft, nt PEG tube in place Central nervous system: Alert, follows some command, left side hemiparesis.  Extremities: trace  edema  Data Reviewed: I have personally reviewed following labs and imaging studies  CBC: Recent Labs  Lab 10/02/22 0327 10/05/22 0459 10/07/22 0332  WBC 41.0* 35.4* 33.7*  HGB 12.8* 12.4* 12.1*  HCT 39.5 39.2 38.8*  MCV 93.4 93.3 94.6  PLT 165 154 145*    Basic Metabolic  Panel: Recent Labs  Lab 10/02/22 0327 10/05/22 0459 10/06/22 0706 10/07/22 0332  NA 143 141 141 137  K 4.1 3.7 3.8 3.4*  CL 104 103 101 101  CO2 27 29 29 28   GLUCOSE 143* 119* 131* 135*  BUN 28* 19 19 14   CREATININE 0.87 0.78 0.79 0.78  CALCIUM 9.5 9.4 9.5 9.0  MG  --  2.1 2.1 1.8    GFR: Estimated Creatinine Clearance: 119.7 mL/min (by C-G formula based on SCr of 0.78 mg/dL). Liver Function Tests: No results for input(s): "AST", "ALT", "ALKPHOS", "BILITOT", "PROT", "ALBUMIN" in the last 168 hours.  No results for input(s): "LIPASE", "AMYLASE" in the last 168 hours. No results for input(s): "AMMONIA" in the last 168 hours. Coagulation Profile: No results for input(s): "INR", "PROTIME" in the last 168 hours.  Cardiac Enzymes: No results for input(s): "CKTOTAL", "CKMB", "CKMBINDEX", "TROPONINI" in the last 168 hours. BNP (last 3 results) No results for input(s): "PROBNP" in the last 8760 hours. HbA1C: No results for input(s): "HGBA1C" in the last 72 hours. CBG: Recent Labs  Lab 10/07/22 1245 10/07/22 1703 10/07/22 2050 10/08/22 0017 10/08/22 0405  GLUCAP 98 103* 93 109* 125*    Lipid Profile: No results for input(s): "CHOL", "HDL", "LDLCALC", "TRIG", "CHOLHDL", "LDLDIRECT" in the last 72 hours. Thyroid Function Tests: No results for input(s): "TSH", "T4TOTAL", "FREET4", "T3FREE", "THYROIDAB" in the last 72 hours.  Anemia Panel: No results for input(s): "VITAMINB12", "FOLATE", "FERRITIN", "TIBC", "IRON", "RETICCTPCT" in the last 72 hours. Sepsis Labs: No results for input(s): "PROCALCITON", "LATICACIDVEN" in the last 168 hours.  No results found for this or any previous visit (from the past 240 hour(s)).       Radiology Studies: No results found.      Scheduled Meds:  amLODipine  10 mg Per Tube Daily   arformoterol  15 mcg Nebulization BID   famotidine  20 mg Per Tube Daily   feeding supplement (JEVITY 1.5 CAL/FIBER)  1,000 mL Per Tube Q24H   fiber   1 packet Per Tube BID   fluconazole  100 mg Oral Daily   free water  200 mL Per Tube Q6H   heparin injection (subcutaneous)  5,000 Units Subcutaneous Q8H   hydrALAZINE  10 mg Oral BID   insulin aspart  0-15 Units Subcutaneous Q4H   lisinopril  40 mg Per Tube Daily   metoprolol tartrate  25 mg Per Tube BID   mouth rinse  15 mL Mouth Rinse BID   revefenacin  175 mcg Nebulization Daily   saccharomyces boulardii  250 mg Oral BID   Continuous Infusions:   ceFAZolin (ANCEF) IV       LOS: 37 days    Time spent: 35 minutes    Lielle Vandervort A Catlin Aycock, MD [facility]   If 7PM-7AM, please contact night-coverage [email or web address]  10/08/2022, 7:15 AM

## 2022-10-08 NOTE — Progress Notes (Signed)
Mobility Specialist Progress Note   10/08/22 1120  Mobility  Activity  (Tilt progression)  Level of Assistance +2 (takes two people)  Assistive Device  [employer]yst Tilt Bed)  Range of Motion/Exercises Passive;Active  Activity Response Tolerated well   Tilt Bed Progression Degrees Minutes BP  _0 40 1   45 1    Patient was seen for tilt progression. Tolerated progressing tilt to 45 degrees but could not go beyond as bed had mechanical issues/error messages. RN and rep notified of pillow being lodged in the frame of bed causing bed failure. Was left lying flat in supine with all needs met, call bell in reach.   [place] Paden Senger, [place], [place]  Office: [phone]

## 2022-10-09 LAB — BASIC METABOLIC PANEL
Anion gap: 9 (ref 5–15)
BUN: 12 mg/dL (ref 8–23)
CO2: 26 mmol/L (ref 22–32)
Calcium: 9.4 mg/dL (ref 8.9–10.3)
Chloride: 101 mmol/L (ref 98–111)
Creatinine, Ser: 0.83 mg/dL (ref 0.61–1.24)
GFR, Estimated: >60 mL/min (ref >60)
Glucose, Bld: 106 mg/dL — ABNORMAL HIGH (ref 70–99)
Potassium: 4 mmol/L (ref 3.5–5.1)
Sodium: 136 mmol/L (ref 135–145)

## 2022-10-09 LAB — CBC
HCT: 39.6 % (ref 39.0–52.0)
Hemoglobin: 13 g/dL (ref 13.0–17.0)
MCH: 30 pg (ref 26.0–34.0)
MCHC: 32.8 g/dL (ref 30.0–36.0)
MCV: 91.2 fL (ref 80.0–100.0)
Platelets: 175 10*3/uL (ref 150–400)
RBC: 4.34 MIL/uL (ref 4.22–5.81)
RDW: 12.5 % (ref 11.5–15.5)
WBC: 41.7 10*3/uL — ABNORMAL HIGH (ref 4.0–10.5)
nRBC: 0 % (ref 0.0–0.2)

## 2022-10-09 LAB — GLUCOSE, CAPILLARY
Glucose-Capillary: 107 mg/dL — ABNORMAL HIGH (ref 70–99)
Glucose-Capillary: 107 mg/dL — ABNORMAL HIGH (ref 70–99)
Glucose-Capillary: 139 mg/dL — ABNORMAL HIGH (ref 70–99)
Glucose-Capillary: 143 mg/dL — ABNORMAL HIGH (ref 70–99)
Glucose-Capillary: 91 mg/dL (ref 70–99)
Glucose-Capillary: 98 mg/dL (ref 70–99)

## 2022-10-09 NOTE — Progress Notes (Signed)
PROGRESS NOTE    Dustin Jones  [id number] DOB: [birth year] DOA: 09/03/2022 PCP: Marliss Coots, NP   Brief Narrative: 61 year old with past medical history significant for hypertension, history of DVT admitted 9/28 as a code stroke, admitted to ICU with working diagnosis of intracranial hemorrhage right MCA ruptured aneurysm, has had prolonged hospitalization including multiple procedures, work-up including right ICA coil embolization of right ICA terminus aneurysm on 08/27/2022, tracheostomy by CCM on 10/10 and transferred to [facility] on 10/22.  9/28 admitted intracranial hemorrhage right MCA ruptured aneurysm status post CT head. 9/29: MRI brain large acute intraparenchymal hemorrhage extension into the right greater than left lateral ventricle, third ventricle and fourth ventricle. 9/29 developed fever and pneumonia, MSSA and respiratory culture white blood cell up to 56.  Treated. 10/15 transferred to the hospitalist service, transferred back to ICU for worsening respiratory status.  Was placed back on vent. 10/26; IVC filter and PEG tube placed by IR. 10/28: After conference call with family CODE STATUS was changed to DNR/DNI do not placed back on vent support. 11/01: Speech recommend Dysphagia 3 diet.   Assessment & Plan:   Principal Problem:   ICH (intracerebral hemorrhage) (HCC) Active Problems:   Intracranial hemorrhage (HCC)   Pressure injury of skin   Acute respiratory failure with hypoxia (HCC)   Aspiration pneumonia of both lungs (HCC)   Hypernatremia   Tracheostomy in place (HCC)   Ventilator associated pneumonia (HCC)   Leukocytosis   Fever   1-Acute Hypoxic Respiratory Failure secondary to Pneumonia: Pneumonia due to MSSA and Enterobacter: Status post tracheostomy in place on trach collar: -He completed antibiotics for pneumonia. -Continue with trach management, CCM following weekly -Continue with respiratory care suctioning and positioning. -Stable trach  collar.   2-Intracranial Hemorrhage right MCA rupture aneurysmal -Status post R ICA coil embolization of right ICA terminus aneurysm on 08/20/2022 -Continue to monitor blood pressure -Continue with metoprolol, Norvasc and lisinopril -Acute metabolic encephalopathy -Continue with supportive care, frequent repositioning. -He has make some improvement, he is following some command, he is able to moves right side, left side hemiparesis, he was able to moves left finger and left hand slightly.  -Speech to follow for language, Pauci valve.    Dysphagia Status post PEG tube placed by IR 10/26 continue with tube feedings Speech therapy recommended dysphagia 3 diet 11/1. Plan to transition tube feeding to nocturnal.   Bilateral DVTs: Not a candidate for anticoagulation due to [place], status post IVC filter in 09/29/2022 by IR.  Hypertension: Continue with metoprolol lisinopril and amlodipine Started  low-dose hydralazine 11/03.  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 WBC fluctuates, 35--40.  Needs follow-up with hematology. Monitor.   Diabetes type 2 Continue  with a sliding scale insulin  Depression Thrombocytopenia: Resolved Nonsustained V. tach: Continue with metoprolol. Normal Mg Rectal tube in place: Diarrhea resolving.  Risk of malnutrition with inadequate oral intake continue with tube feedings Morbid obesity BMI 41: Oral thrush: Started  Diflucan.  Wound care documentation below, pressure injury also pretibial right distal stage II POA: Continue with wound care Hypokalemia; Replaced.     Pressure Injury 08/18/2022 Pretibial Distal;Right;Lateral Stage 2 -  Partial thickness loss of dermis presenting as a shallow open injury with a red, pink wound bed without slough. Old healing ulcer (Active)  08/27/2022 1630  Location: Pretibial  Location Orientation: Distal;Right;Lateral  Staging: Stage 2 -  Partial thickness loss of dermis presenting as a  shallow  open injury with a red, pink wound bed without slough.  Wound Description (Comments): Old healing ulcer  Present on Admission: Yes  Dressing Type None 10/09/22 0741     Nutrition Problem: Inadequate oral intake Etiology: inability to eat    Signs/Symptoms: NPO status    Interventions: Prostat, Tube feeding  Estimated body mass index is 40.57 kg/m as calculated from the following:   Height as of this encounter: 5\' 7"  (1.702 m).   Weight as of this encounter: 117.5 kg.   DVT prophylaxis: heparin  Code Status: DNR/DNI Family Communication:no family at bedside. Daughter updated 11/02 Disposition Plan:  Status is: Inpatient Remains inpatient appropriate because: needs placement    Consultants:  Palliative neurology    Subjective: He is alert, follows command, moves right side. Left arm no mobility only hand  Objective: Vitals:   10/09/22 0536 10/09/22 0744 10/09/22 0750 10/09/22 1137  BP:  (!) 151/89 (!) 151/89 (!) 147/77  Pulse:  89 84 67  Resp:  14 15 16   Temp:    98.3 F (36.8 C)  TempSrc:    Axillary  SpO2:  93% 94% 100%  Weight: 117.5 kg     Height:        Intake/Output Summary (Last 24 hours) at 10/09/2022 1201 Last data filed at 10/09/2022 0700 Gross per 24 hour  Intake 1180 ml  Output 1500 ml  Net -320 ml    Filed Weights   10/04/22 0413 10/05/22 0500 10/09/22 0536  Weight: 118.9 kg 119 kg 117.5 kg    Examination:  General exam: NAD Respiratory system: Trach collar.  Cardiovascular system: S 1, S 2 RRR Gastrointestinal system: BS present, soft, nt PEG tube in place Central nervous system: Alert, follows some command, left side hemiparesis.  Extremities: trace  edema  Data Reviewed: I have personally reviewed following labs and imaging studies  CBC: Recent Labs  Lab 10/05/22 0459 10/07/22 0332 10/09/22 0255  WBC 35.4* 33.7* 41.7*  HGB 12.4* 12.1* 13.0  HCT 39.2 38.8* 39.6  MCV 93.3 94.6 91.2  PLT 154 145* 175     Basic Metabolic Panel: Recent Labs  Lab 10/05/22 0459 10/06/22 0706 10/07/22 0332 10/08/22 0805 10/09/22 0255  NA 141 141 137 138 136  K 3.7 3.8 3.4* 3.9 4.0  CL 103 101 101 102 101  CO2 29 29 28 27 26   GLUCOSE 119* 131* 135* 112* 106*  BUN 19 19 14 8 12   CREATININE 0.78 0.79 0.78 0.63 0.83  CALCIUM 9.4 9.5 9.0 9.3 9.4  MG 2.1 2.1 1.8 2.0  --     GFR: Estimated Creatinine Clearance: 114.6 mL/min (by C-G formula based on SCr of 0.83 mg/dL). Liver Function Tests: No results for input(s): "AST", "ALT", "ALKPHOS", "BILITOT", "PROT", "ALBUMIN" in the last 168 hours.  No results for input(s): "LIPASE", "AMYLASE" in the last 168 hours. No results for input(s): "AMMONIA" in the last 168 hours. Coagulation Profile: No results for input(s): "INR", "PROTIME" in the last 168 hours.  Cardiac Enzymes: No results for input(s): "CKTOTAL", "CKMB", "CKMBINDEX", "TROPONINI" in the last 168 hours. BNP (last 3 results) No results for input(s): "PROBNP" in the last 8760 hours. HbA1C: No results for input(s): "HGBA1C" in the last 72 hours. CBG: Recent Labs  Lab 10/08/22 1951 10/09/22 0009 10/09/22 0334 10/09/22 0740 10/09/22 1138  GLUCAP 110* 139* 107* 143* 98    Lipid Profile: No results for input(s): "CHOL", "HDL", "LDLCALC", "TRIG", "CHOLHDL", "LDLDIRECT" in the last 72 hours. Thyroid Function Tests:  No results for input(s): "TSH", "T4TOTAL", "FREET4", "T3FREE", "THYROIDAB" in the last 72 hours. Anemia Panel: No results for input(s): "VITAMINB12", "FOLATE", "FERRITIN", "TIBC", "IRON", "RETICCTPCT" in the last 72 hours. Sepsis Labs: No results for input(s): "PROCALCITON", "LATICACIDVEN" in the last 168 hours.  No results found for this or any previous visit (from the past 240 hour(s)).       Radiology Studies: No results found.      Scheduled Meds:  amLODipine  10 mg Per Tube Daily   arformoterol  15 mcg Nebulization BID   famotidine  20 mg Per Tube Daily    feeding supplement (JEVITY 1.5 CAL/FIBER)  1,000 mL Per Tube Q24H   fiber  1 packet Per Tube BID   fluconazole  100 mg Oral Daily   free water  200 mL Per Tube Q6H   heparin injection (subcutaneous)  5,000 Units Subcutaneous Q8H   hydrALAZINE  10 mg Oral BID   insulin aspart  0-15 Units Subcutaneous Q4H   lisinopril  40 mg Per Tube Daily   metoprolol tartrate  25 mg Per Tube BID   mouth rinse  15 mL Mouth Rinse BID   revefenacin  175 mcg Nebulization Daily   saccharomyces boulardii  250 mg Oral BID   Continuous Infusions:   ceFAZolin (ANCEF) IV       LOS: 38 days    Time spent: 35 minutes    Brentt Fread A Peniel Hass, MD [facility]   If 7PM-7AM, please contact night-coverage [email or web address]  10/09/2022, 12:01 PM

## 2022-10-09 NOTE — Plan of Care (Signed)
  Problem: Clinical Measurements: Goal: Ability to maintain clinical measurements within normal limits will improve Outcome: Progressing Goal: Will remain free from infection Outcome: Progressing Goal: Respiratory complications will improve Outcome: Progressing Goal: Cardiovascular complication will be avoided Outcome: Progressing   Problem: Nutrition: Goal: Adequate nutrition will be maintained Outcome: Progressing   Problem: Elimination: Goal: Will not experience complications related to urinary retention Outcome: Progressing   Problem: Pain Managment: Goal: General experience of comfort will improve Outcome: Progressing   Problem: Safety: Goal: Ability to remain free from injury will improve Outcome: Progressing

## 2022-10-10 LAB — GLUCOSE, CAPILLARY
Glucose-Capillary: 109 mg/dL — ABNORMAL HIGH (ref 70–99)
Glucose-Capillary: 127 mg/dL — ABNORMAL HIGH (ref 70–99)
Glucose-Capillary: 133 mg/dL — ABNORMAL HIGH (ref 70–99)
Glucose-Capillary: 97 mg/dL (ref 70–99)
Glucose-Capillary: 104 mg/dL — ABNORMAL HIGH (ref 70–99)
Glucose-Capillary: 103 mg/dL — ABNORMAL HIGH (ref 70–99)

## 2022-10-10 MED ORDER — SACCHAROMYCES BOULARDII 250 MG PO CAPS
250.0000 mg | ORAL_CAPSULE | Freq: Two times a day (BID) | ORAL | Status: DC
  Administered 2022-10-10 – 2022-10-27 (×36): 250 mg
  Filled 2022-10-10 (×38): qty 1

## 2022-10-10 MED ORDER — HYDRALAZINE HCL 10 MG PO TABS
10.0000 mg | ORAL_TABLET | Freq: Two times a day (BID) | ORAL | Status: DC
  Administered 2022-10-10 – 2022-10-27 (×36): 10 mg
  Filled 2022-10-10 (×37): qty 1

## 2022-10-10 MED ORDER — FLUCONAZOLE 40 MG/ML PO SUSR
100.0000 mg | Freq: Every day | ORAL | Status: AC
  Administered 2022-10-10 – 2022-10-14 (×5): 100 mg
  Filled 2022-10-10 (×5): qty 2.5

## 2022-10-10 NOTE — TOC Progression Note (Signed)
Transition of Care [facility]) - Progression Note    Patient Details  Name: Dustin Jones MRN: [id number] Date of Birth: 1961/03/02  [facility] CM/SW [place], [name] Phone Number: 10/10/2022, 2:28 PM  Clinical Narrative:    TOC continuing to follow and CSW advised pt is progressing steadily towards goals. CSW confirmed the three facilities in [place] are unable to accept a pt with a trach at this time. CSW searched in [place] for appropriate facilities. [facility] facilities in [place]- referral sent to admissions for consideration for any of their facilities. Advised pt would need a pending [place] medicaid application prior to admission if approved. [facility]- unable to get through to admissions [facility]- VM left [facility]- VM left for pulmonary admissions liaison. [facility]- Referral sent.   TOC will continue to follow for discharge planning.       Expected Discharge Plan and Services                                                 Social Determinants of Health (SDOH) Interventions    Readmission Risk Interventions     No data to display

## 2022-10-10 NOTE — Plan of Care (Signed)
  Problem: Pain Managment: Goal: General experience of comfort will improve Outcome: Progressing   Problem: Safety: Goal: Ability to remain free from injury will improve Outcome: Progressing   Problem: Skin Integrity: Goal: Risk for impaired skin integrity will decrease Outcome: Progressing   

## 2022-10-10 NOTE — Progress Notes (Signed)
PROGRESS NOTE    Dustin Jones  IWL:[id number] DOB: 1961/06/05 DOA: 09/23/22 PCP: Marliss Coots, NP   Brief Narrative: 61 year old with past medical history significant for hypertension, history of DVT admitted 9/28 as a code stroke, admitted to ICU with working diagnosis of intracranial hemorrhage right MCA ruptured aneurysm, has had prolonged hospitalization including multiple procedures, work-up including right ICA coil embolization of right ICA terminus aneurysm on 2022-09-23, tracheostomy by CCM on 10/10 and transferred to [facility] on 10/22.  9/28 admitted intracranial hemorrhage right MCA ruptured aneurysm status post CT head. 9/29: MRI brain large acute intraparenchymal hemorrhage extension into the right greater than left lateral ventricle, third ventricle and fourth ventricle. 9/29 developed fever and pneumonia, MSSA and respiratory culture white blood cell up to 56.  Treated. 10/15 transferred to the hospitalist service, transferred back to ICU for worsening respiratory status.  Was placed back on vent. 10/26; IVC filter and PEG tube placed by IR. 10/28: After conference call with family CODE STATUS was changed to DNR/DNI do not placed back on vent support. 11/01: Speech recommend Dysphagia 3 diet.   Assessment & Plan:   Principal Problem:   ICH (intracerebral hemorrhage) (HCC) Active Problems:   Intracranial hemorrhage (HCC)   Pressure injury of skin   Acute respiratory failure with hypoxia (HCC)   Aspiration pneumonia of both lungs (HCC)   Hypernatremia   Tracheostomy in place (HCC)   Ventilator associated pneumonia (HCC)   Leukocytosis   Fever   1-Acute Hypoxic Respiratory Failure secondary to Pneumonia: Pneumonia due to MSSA and Enterobacter: Status post tracheostomy in place on trach collar: -He completed antibiotics for pneumonia. -Continue with trach management, CCM following weekly -Continue with respiratory care suctioning and positioning. -Stable trach  collar.   2-Intracranial Hemorrhage right MCA rupture aneurysmal:  -Status post R ICA coil embolization of right ICA terminus aneurysm on 09-23-2022 -Continue to monitor blood pressure -Continue with metoprolol, Norvasc and lisinopril -Acute metabolic encephalopathy -Continue with supportive care, frequent repositioning. -He has make some improvement, he is following some command, he is able to moves right side, left side hemiparesis, he was able to moves  finger and left hand slightly.  -Speech to follow for language, Pauci valve.    Dysphagia Status post PEG tube placed by IR 10/26 continue with tube feedings Speech therapy recommended dysphagia 3 diet 11/1. Now on nocturnal tube feeding.   Bilateral DVTs: Not a candidate for anticoagulation due to [place], status post IVC filter in 09/29/2022 by IR.  Hypertension: Continue with metoprolol lisinopril and amlodipine Started  low-dose hydralazine 11/03.  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 WBC fluctuates, 35--40.  Needs follow-up with hematology. Monitor.   Diabetes type 2 Continue  with a sliding scale insulin  Depression Thrombocytopenia: Resolved Nonsustained V. tach: Continue with metoprolol. Normal Mg Rectal tube in place: Diarrhea resolving.  Risk of malnutrition with inadequate oral intake continue with tube feedings Morbid obesity BMI 41: Oral thrush: Started  Diflucan.  Wound care documentation below, pressure injury also pretibial right distal stage II POA: Continue with wound care Hypokalemia; Replaced.     Pressure Injury 2022/09/23 Pretibial Distal;Right;Lateral Stage 2 -  Partial thickness loss of dermis presenting as a shallow open injury with a red, pink wound bed without slough. Old healing ulcer (Active)  09/23/22 1630  Location: Pretibial  Location Orientation: Distal;Right;Lateral  Staging: Stage 2 -  Partial thickness loss of dermis presenting as a shallow open  injury with a red, pink wound bed without slough.  Wound Description (Comments): Old healing ulcer  Present on Admission: Yes  Dressing Type None 10/10/22 0500     Nutrition Problem: Inadequate oral intake Etiology: inability to eat    Signs/Symptoms: NPO status    Interventions: Prostat, Tube feeding  Estimated body mass index is 40.57 kg/m as calculated from the following:   Height as of this encounter: 5\' 7"  (1.702 m).   Weight as of this encounter: 117.5 kg.   DVT prophylaxis: heparin  Code Status: DNR/DNI Family Communication:no family at bedside. Daughter updated 11/02 Disposition Plan:  Status is: Inpatient Remains inpatient appropriate because: needs placement    Consultants:  Palliative neurology    Subjective: No events overnight, following some command. Not talking.    Objective: Vitals:   10/10/22 0708 10/10/22 0718 10/10/22 1051 10/10/22 1109  BP:  (!) 156/97  (!) 127/94  Pulse: 88 88 90 65  Resp: 16 15 16 19   Temp:  98 F (36.7 C)  97.9 F (36.6 C)  TempSrc:  Axillary  Axillary  SpO2: 96% 96% 98% 98%  Weight:      Height:        Intake/Output Summary (Last 24 hours) at 10/10/2022 1218 Last data filed at 10/10/2022 0700 Gross per 24 hour  Intake 600 ml  Output 2502 ml  Net -1902 ml    Filed Weights   10/04/22 0413 10/05/22 0500 10/09/22 0536  Weight: 118.9 kg 119 kg 117.5 kg    Examination:  General exam: NAD Respiratory system: Trach Collar.  Cardiovascular system: S 1, S 2 RRR Gastrointestinal system: BS present, soft, nt PEG tube in place Central nervous system: alert, left side hemiparesis. Follows some command Extremities: Trace edema   Data Reviewed: I have personally reviewed following labs and imaging studies  CBC: Recent Labs  Lab 10/05/22 0459 10/07/22 0332 10/09/22 0255  WBC 35.4* 33.7* 41.7*  HGB 12.4* 12.1* 13.0  HCT 39.2 38.8* 39.6  MCV 93.3 94.6 91.2  PLT 154 145* 0000000    Basic Metabolic  Panel: Recent Labs  Lab 10/05/22 0459 10/06/22 0706 10/07/22 0332 10/08/22 0805 10/09/22 0255  NA 141 141 137 138 136  K 3.7 3.8 3.4* 3.9 4.0  CL 103 101 101 102 101  CO2 29 29 28 27 26   GLUCOSE 119* 131* 135* 112* 106*  BUN 19 19 14 8 12   CREATININE 0.78 0.79 0.78 0.63 0.83  CALCIUM 9.4 9.5 9.0 9.3 9.4  MG 2.1 2.1 1.8 2.0  --     GFR: Estimated Creatinine Clearance: 114.6 mL/min (by C-G formula based on SCr of 0.83 mg/dL). Liver Function Tests: No results for input(s): "AST", "ALT", "ALKPHOS", "BILITOT", "PROT", "ALBUMIN" in the last 168 hours.  No results for input(s): "LIPASE", "AMYLASE" in the last 168 hours. No results for input(s): "AMMONIA" in the last 168 hours. Coagulation Profile: No results for input(s): "INR", "PROTIME" in the last 168 hours.  Cardiac Enzymes: No results for input(s): "CKTOTAL", "CKMB", "CKMBINDEX", "TROPONINI" in the last 168 hours. BNP (last 3 results) No results for input(s): "PROBNP" in the last 8760 hours. HbA1C: No results for input(s): "HGBA1C" in the last 72 hours. CBG: Recent Labs  Lab 10/09/22 1938 10/09/22 2336 10/10/22 0341 10/10/22 0800 10/10/22 1210  GLUCAP 91 127* 109* 133* 97    Lipid Profile: No results for input(s): "CHOL", "HDL", "LDLCALC", "TRIG", "CHOLHDL", "LDLDIRECT" in the last 72 hours. Thyroid Function Tests: No results for input(s): "TSH", "  T4TOTAL", "FREET4", "T3FREE", "THYROIDAB" in the last 72 hours. Anemia Panel: No results for input(s): "VITAMINB12", "FOLATE", "FERRITIN", "TIBC", "IRON", "RETICCTPCT" in the last 72 hours. Sepsis Labs: No results for input(s): "PROCALCITON", "LATICACIDVEN" in the last 168 hours.  No results found for this or any previous visit (from the past 240 hour(s)).       Radiology Studies: No results found.      Scheduled Meds:  amLODipine  10 mg Per Tube Daily   arformoterol  15 mcg Nebulization BID   famotidine  20 mg Per Tube Daily   feeding supplement (JEVITY  1.5 CAL/FIBER)  1,000 mL Per Tube Q24H   fiber  1 packet Per Tube BID   fluconazole  100 mg Per Tube Daily   free water  200 mL Per Tube Q6H   heparin injection (subcutaneous)  5,000 Units Subcutaneous Q8H   hydrALAZINE  10 mg Per Tube BID   insulin aspart  0-15 Units Subcutaneous Q4H   lisinopril  40 mg Per Tube Daily   metoprolol tartrate  25 mg Per Tube BID   mouth rinse  15 mL Mouth Rinse BID   revefenacin  175 mcg Nebulization Daily   saccharomyces boulardii  250 mg Per Tube BID   Continuous Infusions:   ceFAZolin (ANCEF) IV       LOS: 39 days    Time spent: 35 minutes    Shahira Fiske A Lilit Cinelli, MD [facility]   If 7PM-7AM, please contact night-coverage [email or web address]  10/10/2022, 12:18 PM

## 2022-10-10 NOTE — Progress Notes (Signed)
Speech Language Pathology Treatment: Dysphagia;Cognitive-Linquistic;Passy Muir Speaking valve  Patient Details Name: Dustin Jones MRN: [id number] DOB: 1961-03-28 Today's Date: 10/10/2022 Time: 2703-5009 SLP Time Calculation (min) (ACUTE ONLY): 25 min  Assessment / Plan / Recommendation Clinical Impression  Pt was seen for treatment. He was alert and cooperative during the session. Cuff was deflated by SLP and PMSV donned. Pt tolerated it for the duration of the session with stable vitals. Pt communicated verbally with adequate vocal intensity and a mildly rough vocal quality. Pt's processing speed was reduced and repetition was intermittently necessary due to impairments in sustained attention. Pt provided his name to nursing, but no response was elicited for RN's temporal orientation questions. Pt reported that he was working at [facility] prior to admission, but he was unable to provide any additional information regarding this and stated that he believes his cognition is at baseline.  Pt was seen with breakfast tray which included thin liquids via straw and chopped pancakes and sausage. Pt tolerated all solids and liquids without overt s/s of aspiration. Mastication and oral clearance were improved compared to when he was last seen by SLP and no significant oral residue was noted. Pt's current diet of dysphagia 3 solids and thin liquids will be continued. PMSV may be used during all therapies with intermittent supervision. SLP will continue to follow pt.   HPI HPI: Dustin Jones is a 61 y.o. male who is homeless, presenting 9/28 after being found down by bystanders. 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 S/P coil embolization of R ICA aneurysm 9/28. Intubated 9/29. Trach placement 10/10. Cortrak 10/11; G-tube 10/26.  No PMH on file.      SLP Plan  Continue with current plan of  care      Recommendations for follow up therapy are one component of a multi-disciplinary discharge planning process, led by the attending physician.  Recommendations may be updated based on patient status, additional functional criteria and insurance authorization.    Recommendations  Diet recommendations: Dysphagia 3 (mechanical soft);Thin liquid Liquids provided via: Cup;Straw Medication Administration: Crushed with puree (or via G-tube) Supervision: Staff to assist with self feeding Compensations: Slow rate;Small sips/bites;Minimize environmental distractions;Follow solids with liquid Postural Changes and/or Swallow Maneuvers: Seated upright 90 degrees      Patient may use Passy-Muir Speech Valve: During all therapies with supervision PMSV Supervision: Intermittent MD: Please consider changing trach tube to : Smaller size;Cuffless         Oral Care Recommendations: Oral care QID Follow Up Recommendations: Skilled nursing-short term rehab (<3 hours/day) Assistance recommended at discharge: Frequent or constant Supervision/Assistance SLP Visit Diagnosis: Dysphagia, oral phase (R13.11) Plan: Continue with current plan of care         Anjani Feuerborn I. Hardin Negus, [place], [place] Office number [phone]   [name]  10/10/2022, 9:45 AM

## 2022-10-10 NOTE — Progress Notes (Signed)
NAME:  Dustin Jones, MRN:  [id number], DOB:  15-Nov-1961, LOS: 58 ADMISSION DATE:  08/11/2022, CONSULTATION DATE:  08/11/2022 REFERRING MD:  Maryan Rued, EDP, CHIEF COMPLAINT:  ICH   History of Present Illness:  61 year old man who presented to [facility] 9/28 as a Code Stroke. PMHx significant for HTN, DVT and chronic tobacco use.   Admitted with a right intraparenchymal hemorrhage in the setting of right ICA aneurysm status post coiling.  Course complicated by recurrent pneumonia and respiratory failure requiring tracheostomy He was transferred to the floor 10/15 but transfer back due to respiratory distress on 10/16  Pertinent  Medical History  HTN, tobacco use, and DVT   Significant Hospital Events: Including procedures, antibiotic start and stop dates in addition to other pertinent events   9/28 admitted. Intracranial Hemorrhage/ R MCA ruptured aneurysm Non-con CT of head demonstrating large acute hemorrhage in the R lentiform nucleus with with intraventricular extension, midline shift, and slight rounding of the temporal horns, possibly representing early hydrocephalus. On CTA, pt found to have a R MCA ruptured aneurysm. Neurosurgery consulted and following with plans for possible arterial clipping this evening. CT of the face and cervical spine did not show any acute traumatic injuries, though BL cervical lymphadenopathy was noted. Echo with normal biventricular function without evidence of hemodynamically sig valvular heart disease.  9/28 complete echo with LVEF of 60-65%, no regional wall motion abnormalities but with grade 1 diastolic dysfunction.  Normal right ventricular systolic function.  No valvular abnormalities. 9/29 MRI brain 1. Large acute intraparenchymal hemorrhage centered in the rightlentiform nucleus, with extension into the right-greater-than-left lateral ventricle, third ventricle, and fourth ventricle, overall similar to the prior CT. Mild mass effect and 5 mm of right-to-left  midline shift. No hydrocephalus. 2. Small areas of acute infarct in the bilateral occipital lobes, bilateral frontal lobes, and right parietal lobe. Spiking fever. Has sig leukocytosis. Cultures sent. Unasyn started for aspiration  9/29 respiratory culture positive for MSSA 9/29 self extubated, required reintubation 10/2 Family identified  10/6 bronchospasm, steroids of this, bronc with clear lungs, ETT 10/9 Neuro exam/mental status remains poor. Coming down on vent requirements. BP improved, remains off of Cleviprex. D/w family re: tracheostomy. 10/10 Remains hypertensive, agitation. Sedation adjusted (Precedex, Fentanyl/Versed PRN). Increased peak pressures on vent. CXR worse with asymmetric pulmonary edema. Lasix given. Trach with CCM. 10/11 Cortrak placed 10/11 ID consulted, abx changed from Cefazolin to Cefepime 10/13 WBC up to 56 (was 49 day prior). CT chest/abd/pelv ordered. 10/15-transferred to the hospitalist service 10/16-Transferred to ICU for worsening respiratory status  10/18: Patient was taken off of ventilator and put on trach collar, patient initially did well, and then started having trouble breathing and was belly breathing, and therefore was back on the vent 10/20 Unable to tolerate Passy-Muir valve per speech therapy, Able to participate with PT 10/22- 09/26/2022>> on 60% ATC, tolerating well. Sats 93% 10/10/2022 >> on 28% ATC, Clearing secretions, strong cough Interim History / Subjective:   Nods to call of name.   No acute changes.  Objective   Blood pressure (!) 127/94, pulse 65, temperature 97.9 F (36.6 C), temperature source Axillary, resp. rate 19, height 5\' 7"  (1.702 m), weight 117.5 kg, SpO2 98 %.    FiO2 (%):  [21 %-28 %] 21 %   Intake/Output Summary (Last 24 hours) at 10/10/2022 1436 Last data filed at 10/10/2022 0700 Gross per 24 hour  Intake 600 ml  Output 2502 ml  Net -1902 ml   [employer]  10/04/22 0413 10/05/22 0500 10/09/22 0536  Weight:  118.9 kg 119 kg 117.5 kg    Examination:  General: 61 year old male patient lying in bed no acute distress HEENT tracheostomy is midline, thick tan secretions on gauze Pulmonary: Bilateral chest excursion, Coarse scattered rhonchi, strong cough Cardiac: Regular rate and rhythm, S1, S1, No RMG Abdomen: Soft not tender, ND, BS +, Body mass index is 40.57 kg/m.  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   Dysphagia PEG placed  Bilateral DVT HTN CLL vs Leukoid reaction  Discussion  Not a candidate for decannulation Currently on 28% aerosol trach collar  Plan Continue routine trach care No plan for downsizing Made progress to 28% ATC, strong cough   Magdalen Spatz, MSN, AGACNP-BC [place] for personal pager PCCM on call pager [phone]  10/10/2022  2:40 PM

## 2022-10-10 NOTE — Progress Notes (Signed)
Physical Therapy Treatment Patient Details Name: Dustin Jones MRN: [id number] DOB: 10/05/1961 Today's Date: 10/10/2022   History of Present Illness The pt is a 61 y.o. unidentified homeless male presenting 9/28 after being found down by bystanders. 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 S/P coil embolization of R ICA aneurysm 9/28. Intubated 9/29. Trach placement 10/10.  Back to ICU 10/15 for respiratory failure, had IVC filter placed 10/26 and PEG.  No PMH on file.    PT Comments    Pt with continued progress towards acute goals. Pt able to perform sit to stand for increased trials this session with +2 assist to RW and in stedy frame, however pt balance and progression in standing continues to be limited secondary to pushing with R and string left lateral lean. Pt able to find midline intermittently with max assist and multimodal cues. Current plan remains appropriate to address deficits and maximize functional independence and decrease caregiver burden. Pt continues to benefit from skilled PT services to progress toward functional mobility goals.    Recommendations for follow up therapy are one component of a multi-disciplinary discharge planning process, led by the attending physician.  Recommendations may be updated based on patient status, additional functional criteria and insurance authorization.  Follow Up Recommendations  Skilled nursing-short term rehab (<3 hours/day) Can patient physically be transported by private vehicle: No   Assistance Recommended at Discharge Frequent or constant Supervision/Assistance  Patient can return home with the following Two people to help with walking and/or transfers;Two people to help with bathing/dressing/bathroom;Assistance with cooking/housework;Assistance with feeding;Direct supervision/assist for medications management;Direct  supervision/assist for financial management;Help with stairs or ramp for entrance;Assist for transportation   Equipment Recommendations  Other (comment) (TBD)    Recommendations for Other Services       Precautions / Restrictions Precautions Precautions: Fall Precaution Comments: trach, PEG     Mobility  Bed Mobility Overal bed mobility: Needs Assistance Bed Mobility: Rolling, Sidelying to Sit, Sit to Sidelying Rolling: Min assist, Mod assist (min to L, mod to R) Sidelying to sit: Mod assist, +2 for physical assistance     Sit to sidelying: Mod assist, +2 for physical assistance General bed mobility comments: min a to roll to R side, mod a +2 to bring BLE off EOB and to push with RUE into sitting    Transfers Overall transfer level: Needs assistance Equipment used: Rolling walker (2 wheels), Ambulation equipment used Transfers: Sit to/from Stand Sit to Stand: Mod assist, Max assist, Total assist, +2 physical assistance           General transfer comment: mod-max a +2 to power up to standing from EOB to RW x2, stedy x1, and stedy seat x2, max-total a +2 to maintain standing balance as pt pushing with R side with with strong L lateral lean, able to intermittently correct with multimodal cues and max assist    Ambulation/Gait               General Gait Details: unable   Stairs             Wheelchair Mobility    Modified Rankin (Stroke Patients Only) Modified Rankin (Stroke Patients Only) Pre-Morbid Rankin Score: No symptoms Modified Rankin: Severe disability     Balance Overall balance assessment: Needs assistance Sitting-balance support: Feet supported Sitting balance-Leahy Scale: Fair Sitting balance - Comments: sditting EOB with feet supported and no UE support  Standing balance support: Bilateral upper extremity supported Standing balance-Leahy Scale: Zero Standing balance comment: +2 for standing x 5 reps,able to come to full upright  however pushing with R nededing max asssit to to find midline                            Cognition Arousal/Alertness: Awake/alert Behavior During Therapy: Flat affect Overall Cognitive Status: Impaired/Different from baseline Area of Impairment: Attention, Following commands, Problem solving                   Current Attention Level: Sustained   Following Commands: Follows one step commands with increased time, Follows one step commands inconsistently     Problem Solving: Slow processing, Decreased initiation, Requires verbal cues          Exercises      General Comments General comments (skin integrity, edema, etc.): on 28% trach collar with VSS during session      Pertinent Vitals/Pain Pain Assessment Pain Assessment: No/denies pain Pain Intervention(s): Monitored during session    Home Living                          Prior Function            PT Goals (current goals can now be found in the care plan section) Acute Rehab PT Goals PT Goal Formulation: Patient unable to participate in goal setting Time For Goal Achievement: 10/20/22 Progress towards PT goals: Progressing toward goals    Frequency    Min 2X/week      PT Plan Current plan remains appropriate    Co-evaluation              AM-PAC PT "6 Clicks" Mobility   Outcome Measure  Help needed turning from your back to your side while in a flat bed without using bedrails?: Total Help needed moving from lying on your back to sitting on the side of a flat bed without using bedrails?: Total Help needed moving to and from a bed to a chair (including a wheelchair)?: Total Help needed standing up from a chair using your arms (e.g., wheelchair or bedside chair)?: Total Help needed to walk in hospital room?: Total Help needed climbing 3-5 steps with a railing? : Total 6 Click Score: 6    End of Session Equipment Utilized During Treatment: Gait belt Activity Tolerance:  Patient tolerated treatment well Patient left: in chair;with call bell/phone within reach (with bed in chair position) Nurse Communication: Mobility status PT Visit Diagnosis: Other abnormalities of gait and mobility (R26.89);Hemiplegia and hemiparesis;Other symptoms and signs involving the nervous system (R29.898);Muscle weakness (generalized) (M62.81) Hemiplegia - Right/Left: Left Hemiplegia - dominant/non-dominant: Non-dominant Hemiplegia - caused by: Nontraumatic intracerebral hemorrhage     Time: 1139-1207 PT Time Calculation (min) (ACUTE ONLY): 28 min  Charges:  $Therapeutic Activity: 23-37 mins                    Hodari Chuba R. PTA Acute Rehabilitation Services Office: [place] 10/10/2022, 12:32 PM

## 2022-10-11 ENCOUNTER — Inpatient Hospital Stay (HOSPITAL_COMMUNITY): Payer: Medicaid Other

## 2022-10-11 LAB — GLUCOSE, CAPILLARY
Glucose-Capillary: 103 mg/dL — ABNORMAL HIGH (ref 70–99)
Glucose-Capillary: 105 mg/dL — ABNORMAL HIGH (ref 70–99)
Glucose-Capillary: 115 mg/dL — ABNORMAL HIGH (ref 70–99)
Glucose-Capillary: 118 mg/dL — ABNORMAL HIGH (ref 70–99)
Glucose-Capillary: 124 mg/dL — ABNORMAL HIGH (ref 70–99)
Glucose-Capillary: 124 mg/dL — ABNORMAL HIGH (ref 70–99)
Glucose-Capillary: 126 mg/dL — ABNORMAL HIGH (ref 70–99)

## 2022-10-11 LAB — CBC
HCT: 39.3 % (ref 39.0–52.0)
Hemoglobin: 12.9 g/dL — ABNORMAL LOW (ref 13.0–17.0)
MCH: 29.7 pg (ref 26.0–34.0)
MCHC: 32.8 g/dL (ref 30.0–36.0)
MCV: 90.6 fL (ref 80.0–100.0)
Platelets: 209 10*3/uL (ref 150–400)
RBC: 4.34 MIL/uL (ref 4.22–5.81)
RDW: 12.6 % (ref 11.5–15.5)
WBC: 46 10*3/uL — ABNORMAL HIGH (ref 4.0–10.5)
nRBC: 0 % (ref 0.0–0.2)

## 2022-10-11 LAB — BASIC METABOLIC PANEL WITH GFR
Anion gap: 9 (ref 5–15)
BUN: 9 mg/dL (ref 8–23)
CO2: 27 mmol/L (ref 22–32)
Calcium: 9.5 mg/dL (ref 8.9–10.3)
Chloride: 102 mmol/L (ref 98–111)
Creatinine, Ser: 0.89 mg/dL (ref 0.61–1.24)
GFR, Estimated: >60 mL/min
Glucose, Bld: 128 mg/dL — ABNORMAL HIGH (ref 70–99)
Potassium: 3.7 mmol/L (ref 3.5–5.1)
Sodium: 138 mmol/L (ref 135–145)

## 2022-10-11 LAB — EXPECTORATED SPUTUM ASSESSMENT W GRAM STAIN, RFLX TO RESP C

## 2022-10-11 MED ORDER — SULFAMETHOXAZOLE-TRIMETHOPRIM 800-160 MG PO TABS
1.0000 | ORAL_TABLET | Freq: Two times a day (BID) | ORAL | Status: DC
  Administered 2022-10-11 – 2022-10-14 (×8): 1 via ORAL
  Filled 2022-10-11 (×9): qty 1

## 2022-10-11 MED ORDER — JEVITY 1.5 CAL/FIBER PO LIQD
1000.0000 mL | ORAL | Status: DC
  Administered 2022-10-11 – 2022-10-17 (×7): 1000 mL
  Filled 2022-10-11 (×8): qty 1000

## 2022-10-11 NOTE — Progress Notes (Signed)
Mobility Specialist: Progress Note   10/11/22 1354  Mobility  Activity Stood at bedside  Level of Assistance +2 (takes two people)  Arts administrator  Activity Response Tolerated well  Mobility Referral Yes  $Mobility charge 1 Mobility   Pre-Mobility 5 L/min 28%: 85 HR, 94% SpO2 Post-Mobility 5 L/min 28%: 68 HR, 92% SpO2  Pt received in the bed and agreeable to mobility. Pt able to initiate towards EOB and overall requiring minA to sit EOB. Pt able to stand x3 with +2 modA. STSx2 from the bed and x1 from the stedy flaps. Cues and physical assist for L hand placement. Heavy modA for push/lean to L side with R UE and LE. Pt assisted back to bed after session with call bell and phone in reach.   Dustin Jones Mobility Specialist Secure Chat Only

## 2022-10-11 NOTE — Progress Notes (Signed)
Occupational Therapy Treatment Patient Details Name: Dustin Jones MRN: [id number] DOB: 1961/03/21 Today's Date: 10/11/2022   History of present illness The pt is a 61 y.o. unidentified homeless male presenting 9/28 after being found down by bystanders. 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 S/P coil embolization of R ICA aneurysm 9/28. Intubated 9/29. Trach placement 10/10.  Back to ICU 10/15 for respiratory failure, had IVC filter placed 10/26 and PEG.  No PMH on file.   OT comments  Pt seen for OT ADL retraining session with focus on attention to R side, grooming using hand over hand technique, LB bathing and repositioning in bed. Pt was initially mod A to wash face with RUE given hand over hand technique to flex R elbow and bring hand to face but ultimately requiring min A to wash face using R UE given increased time and vc for initiation. Therapist remained on pt's R throughout treatment session to encourage tracking and attention to R. Pt was +2 total assist for LB ADL's and to reposition in bed.   Recommendations for follow up therapy are one component of a multi-disciplinary discharge planning process, led by the attending physician.  Recommendations may be updated based on patient status, additional functional criteria and insurance authorization.    Follow Up Recommendations  Skilled nursing-short term rehab (<3 hours/day)    Assistance Recommended at Discharge Frequent or constant Supervision/Assistance  Patient can return home with the following  Two people to help with walking and/or transfers;Two people to help with bathing/dressing/bathroom;Help with stairs or ramp for entrance;Assist for transportation;Direct supervision/assist for financial management;Direct supervision/assist for medications management;Assistance with cooking/housework   Equipment  Recommendations  Other (comment) (Defer to next venue)    Recommendations for Other Services Other (comment) (Pallative)    Precautions / Restrictions Precautions Precautions: Fall Precaution Comments: trach, PEG       Mobility Bed Mobility Overal bed mobility: Needs Assistance   General bed mobility comments: Pt was +2 max/total A to reposition/scoot up in bed with assist using pads    Transfers  Pt repositioned in bed    Balance  NT   ADL either performed or assessed with clinical judgement   ADL Overall ADL's : Needs assistance/impaired     Grooming: Bed level;Wash/dry face;Wash/dry hands;Minimal assistance;Moderate assistance Grooming Details (indicate cue type and reason): Initially mod A to wash face with RUE, with hand over hand technique to flex R elbow and bring hand to face but ultimately requiring min A to wash face using R UE given increased time and vc for initiation. Therapist remained on pt's R throughout treatment session to encourage tracking and attention to R Upper Body Bathing: Maximal assistance;Bed level   Lower Body Bathing: +2 for physical assistance;+2 for safety/equipment;Bed level;Maximal assistance;Total assistance   General ADL Comments: Pt seen for OT ADL retraining session with focus on attention to R side, grooming using hand over hand technique, LB bathing  and repositioning in bed. Pt was initially mod A to wash face with RUE given hand over hand technique to flex R elbow and bring hand to face but ultimately requiring min A to wash face using R UE given increased time and vc for initiation. Therapist remained on pt's R throughout treatment session to encourage tracking and attention to R. Pt was +2 total assist for LB ADL's and to reposition in bed.    Extremity/Trunk Assessment Upper Extremity Assessment Upper Extremity  Assessment: Generalized weakness (3+/5 grip strength; 2+/5 BUE. able to initiate movement against gravity, unable to reach  face consistently when attempted)   Lower Extremity Assessment Lower Extremity Assessment: Defer to PT evaluation   Cervical / Trunk Assessment Cervical / Trunk Assessment: Other exceptions Cervical / Trunk Exceptions: increased body habitus    Vision Baseline Vision/History:  (unsure) Patient Visual Report: No change from baseline (Pt denies visual changes) Vision Assessment?: Vision impaired- to be further tested in functional context          Cognition Arousal/Alertness: Awake/alert Behavior During Therapy: Flat affect Overall Cognitive Status: Impaired/Different from baseline Area of Impairment: Attention, Following commands, Problem solving   Current Attention Level: Sustained   Following Commands: Follows one step commands with increased time, Follows one step commands inconsistently Safety/Judgement: Decreased awareness of safety, Decreased awareness of deficits   Problem Solving: Slow processing, Decreased initiation, Requires verbal cues General Comments: pt responding to some questions this session, follow commands with UE/LE movement bilaterally. Decreased attention to R side of environment. Some awareness of R side when asked to perform tasks with R hand initially unable to bring full attention to R requiring therapist to place hand. However, pt washed face with assist and then performed on his own during a second attempt.                   Pertinent Vitals/ Pain       Pain Assessment Pain Assessment: No/denies pain Faces Pain Scale: No hurt  Home Living Family/patient expects to be discharged to:: Shelter/Homeless   Additional Comments: Per chart, pt is homeless      Prior Functioning/Environment   Unknown, please refer to initial OT assessment for details   Frequency  Min 2X/week        Progress Toward Goals  OT Goals(current goals can now be found in the care plan section)  Progress towards OT goals: Progressing toward goals  Acute Rehab OT  Goals Patient Stated Goal: None stated OT Goal Formulation: With patient Time For Goal Achievement: 10/21/22 Potential to Achieve Goals: [place] Discharge plan remains appropriate       AM-PAC OT "6 Clicks" Daily Activity     Outcome Measure   Help from another person eating meals?: Total Help from another person taking care of personal grooming?: A Lot Help from another person toileting, which includes using toliet, bedpan, or urinal?: Total Help from another person bathing (including washing, rinsing, drying)?: A Lot Help from another person to put on and taking off regular upper body clothing?: A Lot Help from another person to put on and taking off regular lower body clothing?: Total 6 Click Score: 9    End of Session Equipment Utilized During Treatment: Other (comment) (Trach, PEG)  OT Visit Diagnosis: Muscle weakness (generalized) (M62.81);Other symptoms and signs involving the nervous system (R29.898);Other symptoms and signs involving cognitive function   Activity Tolerance Patient limited by fatigue   Patient Left in bed;with call bell/phone within reach;with bed alarm set   Nurse Communication Other (comment) (NT assisted OT to reposition in bed)        Time: 3976-7341 OT Time Calculation (min): 15 min  Charges: OT General Charges $OT Visit: 1 Visit OT Treatments $Therapeutic Activity: 8-22 mins  Anistyn Graddy Beth Dixon, OTR/L 10/11/2022, 11:47 AM

## 2022-10-11 NOTE — Progress Notes (Signed)
Pharmacy Antibiotic Note  Dustin Jones is a 61 y.o. male admitted on 2022/09/13 with possible pneumonia. Trach placed 10/10. Previously grew ESBL E Cloacae from [place] aspirate 10/16. Pharmacy has been consulted for meropenem dosing. Patient is also on fluconazole for oral thrush. Discussed that patient's E Cloacae is sensitive to bactrim. MD agreed to switch to bactrim instead.   Afebrile. 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 CXR with increased mild patchy opacities bilaterally. ClCr > 100 ml/min but likely decreased muscle mass given long hospital stay.  Plan: Bactrim DS 1 tablet per tube Q 12 hr Fluconazole x10 days per MD  Monitor cultures, clinical status, renal function, potassium level  F/u duration    Height: 5\' 7"  (170.2 cm) Weight: 117.1 kg (258 lb 2.5 oz) IBW/kg (Calculated) : 66.1  Temp (24hrs), Avg:98 F (36.7 C), Min:97.5 F (36.4 C), Max:98.3 F (36.8 C)  Recent Labs  Lab 10/05/22 0459 10/06/22 0706 10/07/22 0332 10/08/22 0805 10/09/22 0255 10/11/22 0347  WBC 35.4*  --  33.7*  --  41.7* 46.0*  CREATININE 0.78 0.79 0.78 0.63 0.83 0.89    Estimated Creatinine Clearance: 106.6 mL/min (by C-G formula based on SCr of 0.89 mg/dL).    Not on File  Antimicrobials this admission: Bactrim  11/7 >>  Fluc 11/1 >> 11/10 Cefepime 10/11  >> 10/18 Cefazolin 10/1> 10/5, 10/7>> 10/11,  10/26 Amp/sulb 9/29>> 10/1    Microbiology results: 11/7 TA: pending  10/16 TA: ESBL E cloacae (S-bactrim) 10/10 BAL: E cloacae (S- Cefepime)  10/7 Bcx ngtd 10/6 Bcx 1/3 staph epi  10/5 TA few yeast  9/29 TA:  MSSA   9/28 MRSA PCR: neg  Thank you for allowing pharmacy to be a part of this patient's care.  Benetta Spar, PharmD, BCPS, BCCP Clinical Pharmacist  Please check AMION for all [place] phone numbers After 10:00 PM, call [place] [phone]

## 2022-10-11 NOTE — TOC Progression Note (Signed)
Transition of Care [facility]) - Progression Note    Patient Details  Name: Dustin Jones MRN: [id number] Date of Birth: [birth year]  Transition of Care [facility]) CM/SW [place], [name] Phone Number: 10/11/2022, 10:40 AM  Clinical Narrative:     CSW provided additional clinicals to [facility] to review. CSW discussed payor source with [facility] supervisor and advised a medicaid application would need to be initiated when pt is at a facility in [place]. TOC supervision to consider an LOG if placement is found. TOC will continue to follow for DC needs.       Expected Discharge Plan and Services                                                 Social Determinants of Health (SDOH) Interventions    Readmission Risk Interventions     No data to display

## 2022-10-11 NOTE — Progress Notes (Signed)
Nutrition Follow-up  DOCUMENTATION CODES:  Obesity unspecified  INTERVENTION:  Continue current diet per SLP recommendations Nursing to assist with feeding patient Continue tube feeds via PEG to nocturnal to meet ~60% of needs: Jevity 1.5 @ 70 ml/hr (840 ml/day) Free water flushes per CCM, currently 200 ml q 6 hours Tube feeding regimen provides 1260 kcal, 54g of protein, and 547 ml of H2O. (Flush+TF=1438) Continue nutrisource fiber BID  NUTRITION DIAGNOSIS:  Inadequate oral intake related to inability to eat as evidenced by NPO status. - Ongoing, being addressed via TF  GOAL:  Patient will meet greater than or equal to 90% of their needs - Progressing, met via TF and diet  MONITOR:  TF tolerance  REASON FOR ASSESSMENT:  Consult Enteral/tube feeding initiation and management  ASSESSMENT:  Pt with unknown PMH who is reportedly homeless admitted with ICH/R MCA ruptured aneurysm now s/p coiling and acute hypoxic respiratory failure PNA/ALI due to aspiration.  09/29 - TF started 10/10 - s/p trach placement 10/11 - s/p cortrak placement (tip gastric) 10/12 - weaned to trach collar 10/16 - transferred to ICU, back on vent support 10/18 - Initial PMV assessment 10/22 - Transferred out of ICU 10/26 - PEG placed 11/1 - MBS, advanced to DYS3 with thin liquids  Admit weight: 100 kg - unsure of accuracy, appears stated Current weight: 117.1 kg  Pt resting in bed at the time of assessment. Indicated that he was not having an abdominal pain when asked. Discussed intake with NT. States that pt refused breakfast but she is unsure about lunch, RN assisted with meal. Averaging ~50% of meals per flowsheet.   Will continue to monitor progress with SLP. Currently, pt is not able to meet his nutrition needs orally and nocturnal tube feeds are still needed. Will increase rate slightly as pt is likely not meeting 50% of his needs orally at this time.   Average Meal Intake: 11/1-11/6: 45%  average intake x 12 recorded meals  Nutritionally Relevant Medications: Scheduled Meds:  famotidine  20 mg Per Tube Daily   JEVITY 1.5 CAL/FIBER  1,000 mL Per Tube Q24H   fiber  1 packet Per Tube BID   fluconazole  100 mg Per Tube Daily   free water  200 mL Per Tube Q6H   insulin aspart  0-15 Units Subcutaneous Q4H   saccharomyces boulardii  250 mg Per Tube BID   Continuous Infusions:   ceFAZolin (ANCEF) IV     Labs Reviewed: CBG ranges from 97-126 mg/dL over the last 24 hours  Diet Order:   Diet Order             DIET DYS 3 Room service appropriate? Yes with Assist; Fluid consistency: Thin  Diet effective now                   EDUCATION NEEDS:  No education needs have been identified at this time  Skin: Skin Integrity Issues: Stage II: R leg (pretibial) - old healing ulcer Other: MARSI penis  Last BM:  11/6 - type 5  Height:  Ht Readings from Last 1 Encounters:  09/18/22 _0  (1.702 m)    Weight:  Wt Readings from Last 1 Encounters:  10/11/22 117.1 kg    BMI:  Body mass index is 40.43 kg/m.  Estimated Nutritional Needs:  Kcal:  2000-2200 Protein:  100-120 grams Fluid:  >2 L/day    Ranell Patrick, RD, LDN Clinical Dietitian RD pager # available in AMION  After hours/weekend  pager # available in [facility]

## 2022-10-11 NOTE — Progress Notes (Addendum)
PROGRESS NOTE    Dustin Jones  [id number] DOB: [birth year] DOA: 08/14/2022 PCP: Marliss Coots, NP   Brief Narrative: 61 year old with past medical history significant for hypertension, history of DVT admitted 9/28 as a code stroke, admitted to ICU with working diagnosis of intracranial hemorrhage right MCA ruptured aneurysm, has had prolonged hospitalization including multiple procedures, work-up including right ICA coil embolization of right ICA terminus aneurysm on 08/17/2022, tracheostomy by CCM on 10/10 and transferred to [facility] on 10/22.  9/28 admitted intracranial hemorrhage right MCA ruptured aneurysm status post CT head. 9/29: MRI brain large acute intraparenchymal hemorrhage extension into the right greater than left lateral ventricle, third ventricle and fourth ventricle. 9/29 developed fever and pneumonia, MSSA and respiratory culture white blood cell up to 56.  Treated. 10/15 transferred to the hospitalist service, transferred back to ICU for worsening respiratory status.  Was placed back on vent. 10/26; IVC filter and PEG tube placed by IR. 10/28: After conference call with family CODE STATUS was changed to DNR/DNI do not placed back on vent support. 11/01: Speech recommend Dysphagia 3 diet.  11/07 started Bactrim to cover for PNA.   Assessment & Plan:   Principal Problem:   ICH (intracerebral hemorrhage) (HCC) Active Problems:   Intracranial hemorrhage (HCC)   Pressure injury of skin   Acute respiratory failure with hypoxia (HCC)   Aspiration pneumonia of both lungs (HCC)   Hypernatremia   Tracheostomy dependence (HCC)   Ventilator associated pneumonia (HCC)   Leukocytosis   Fever   1-Acute Hypoxic Respiratory Failure secondary to Pneumonia: Pneumonia due to MSSA and Enterobacter: Status post tracheostomy in place on trach collar: -He completed antibiotics for pneumonia. -Continue with trach management, CCM following weekly -Continue with respiratory care  suctioning and positioning. -Stable trach collar.  -11/06: Had low grade fever, WBC increased to 46 from previous 30. Started Bactrim to cover for pan resistant Enterobacter Cloacae. Follow repeated respiratory culture. Chest x ray: Mild Patchy opacities.   2-Intracranial Hemorrhage right MCA rupture aneurysmal:  -Status post R ICA coil embolization of right ICA terminus aneurysm on 08/29/2022 -Continue to monitor blood pressure -Continue with metoprolol, Norvasc and lisinopril -Acute metabolic encephalopathy -Continue with supportive care, frequent repositioning. -He has make some improvement, he is following  command, he is able to moves right side, left side hemiparesis, he was able to moves  finger and left hand slightly.  -Speech to follow for language, Pauci valve.    Dysphagia Status post PEG tube placed by IR 10/26 continue with tube feedings Speech therapy recommended dysphagia 3 diet 11/1. Now on nocturnal tube feeding.   Bilateral DVTs: Not a candidate for anticoagulation due to [place], status post IVC filter in 09/29/2022 by IR.  Hypertension: Continue with metoprolol lisinopril and amlodipine Started  low-dose hydralazine 11/03.  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 WBC fluctuates, 35--40.  Needs follow-up with hematology. Monitor.   Diabetes type 2 Continue  with a sliding scale insulin  Depression Thrombocytopenia: Resolved Nonsustained V. tach: Continue with metoprolol. Normal Mg Rectal tube in place: Diarrhea resolving.  Risk of malnutrition with inadequate oral intake continue with tube feedings Morbid obesity BMI 41: Oral thrush: Started  Diflucan.  Wound care documentation below, pressure injury also pretibial right distal stage II POA: Continue with wound care Hypokalemia; Replaced.     Pressure Injury 08/19/2022 Pretibial Distal;Right;Lateral Stage 2 -  Partial thickness loss of dermis presenting as a shallow open  injury with a red, pink wound bed without slough. Old healing ulcer (Active)  21-Sep-2022 1630  Location: Pretibial  Location Orientation: Distal;Right;Lateral  Staging: Stage 2 -  Partial thickness loss of dermis presenting as a shallow open injury with a red, pink wound bed without slough.  Wound Description (Comments): Old healing ulcer  Present on Admission: Yes  Dressing Type None 10/10/22 2100     Nutrition Problem: Inadequate oral intake Etiology: inability to eat    Signs/Symptoms: NPO status    Interventions: Prostat, Tube feeding  Estimated body mass index is 40.43 kg/m as calculated from the following:   Height as of this encounter: 5\' 7"  (1.702 m).   Weight as of this encounter: 117.1 kg.   DVT prophylaxis: heparin  Code Status: DNR/DNI Family Communication:no family at bedside. Daughter updated 11/02 Disposition Plan:  Status is: Inpatient Remains inpatient appropriate because: needs placement    Consultants:  Palliative neurology    Subjective: No events overnight.  He follows some command.  He moves right side. Left side hemiparesis.   Objective: Vitals:   10/11/22 0304 10/11/22 0500 10/11/22 0726 10/11/22 0804  BP: (!) 148/82   (!) 142/103  Pulse: 97  95 83  Resp: 18  18 13   Temp: 98.2 F (36.8 C)   98.3 F (36.8 C)  TempSrc: Axillary   Axillary  SpO2: 90%  95% 98%  Weight:  117.1 kg    Height:        Intake/Output Summary (Last 24 hours) at 10/11/2022 1032 Last data filed at 10/11/2022 0700 Gross per 24 hour  Intake 625 ml  Output 1300 ml  Net -675 ml    Filed Weights   10/05/22 0500 10/09/22 0536 10/11/22 0500  Weight: 119 kg 117.5 kg 117.1 kg    Examination:  General exam: NAD Respiratory system: Trach collar Cardiovascular system: S 1, S 2 RRR Gastrointestinal system: BS present, soft, nt PEG tube in place Central nervous system: Alert, Left side hemiparesis. Jearld Shines some command Extremities: Trace edema   Data  Reviewed: I have personally reviewed following labs and imaging studies  CBC: Recent Labs  Lab 10/05/22 0459 10/07/22 0332 10/09/22 0255 10/11/22 0347  WBC 35.4* 33.7* 41.7* 46.0*  HGB 12.4* 12.1* 13.0 12.9*  HCT 39.2 38.8* 39.6 39.3  MCV 93.3 94.6 91.2 90.6  PLT 154 145* 175 335    Basic Metabolic Panel: Recent Labs  Lab 10/05/22 0459 10/06/22 0706 10/07/22 0332 10/08/22 0805 10/09/22 0255 10/11/22 0347  NA 141 141 137 138 136 138  K 3.7 3.8 3.4* 3.9 4.0 3.7  CL 103 101 101 102 101 102  CO2 29 29 28 27 26 27   GLUCOSE 119* 131* 135* 112* 106* 128*  BUN 19 19 14 8 12 9   CREATININE 0.78 0.79 0.78 0.63 0.83 0.89  CALCIUM 9.4 9.5 9.0 9.3 9.4 9.5  MG 2.1 2.1 1.8 2.0  --   --     GFR: Estimated Creatinine Clearance: 106.6 mL/min (by C-G formula based on SCr of 0.89 mg/dL). Liver Function Tests: No results for input(s): "AST", "ALT", "ALKPHOS", "BILITOT", "PROT", "ALBUMIN" in the last 168 hours.  No results for input(s): "LIPASE", "AMYLASE" in the last 168 hours. No results for input(s): "AMMONIA" in the last 168 hours. Coagulation Profile: No results for input(s): "INR", "PROTIME" in the last 168 hours.  Cardiac Enzymes: No results for input(s): "CKTOTAL", "CKMB", "CKMBINDEX", "TROPONINI" in the last 168 hours. BNP (last 3 results) No results for input(s): "  PROBNP" in the last 8760 hours. HbA1C: No results for input(s): "HGBA1C" in the last 72 hours. CBG: Recent Labs  Lab 10/10/22 1612 10/10/22 2103 10/11/22 0006 10/11/22 0356 10/11/22 0803  GLUCAP 104* 103* 124* 126* 115*    Lipid Profile: No results for input(s): "CHOL", "HDL", "LDLCALC", "TRIG", "CHOLHDL", "LDLDIRECT" in the last 72 hours. Thyroid Function Tests: No results for input(s): "TSH", "T4TOTAL", "FREET4", "T3FREE", "THYROIDAB" in the last 72 hours. Anemia Panel: No results for input(s): "VITAMINB12", "FOLATE", "FERRITIN", "TIBC", "IRON", "RETICCTPCT" in the last 72 hours. Sepsis Labs: No  results for input(s): "PROCALCITON", "LATICACIDVEN" in the last 168 hours.  No results found for this or any previous visit (from the past 240 hour(s)).       Radiology Studies: DG Chest 1 View  Result Date: 10/11/2022 CLINICAL DATA:  Fever, history of trach. EXAM: CHEST  1 VIEW COMPARISON:  Chest x-ray September 25, 2022. FINDINGS: Low lung volumes with increased mild patchy opacities bilaterally. No visible pleural effusions or pneumothorax. Cardiomediastinal silhouette is accentuated by technique. Tracheostomy 2 tip projects over the tracheal air column at the level of the clavicular heads. IMPRESSION: Low lung volumes with increased mild patchy opacities bilaterally, which could represent atelectasis, aspiration, and/or pneumonia. Electronically Signed   By: Margaretha Sheffield M.D.   On: 10/11/2022 10:02        Scheduled Meds:  amLODipine  10 mg Per Tube Daily   arformoterol  15 mcg Nebulization BID   famotidine  20 mg Per Tube Daily   feeding supplement (JEVITY 1.5 CAL/FIBER)  1,000 mL Per Tube Q24H   fiber  1 packet Per Tube BID   fluconazole  100 mg Per Tube Daily   free water  200 mL Per Tube Q6H   heparin injection (subcutaneous)  5,000 Units Subcutaneous Q8H   hydrALAZINE  10 mg Per Tube BID   insulin aspart  0-15 Units Subcutaneous Q4H   lisinopril  40 mg Per Tube Daily   metoprolol tartrate  25 mg Per Tube BID   mouth rinse  15 mL Mouth Rinse BID   revefenacin  175 mcg Nebulization Daily   saccharomyces boulardii  250 mg Per Tube BID   Continuous Infusions:   ceFAZolin (ANCEF) IV       LOS: 40 days    Time spent: 35 minutes    Jasir Rother A Glada Wickstrom, MD [facility]   If 7PM-7AM, please contact night-coverage [email or web address]  10/11/2022, 10:32 AM

## 2022-10-12 DIAGNOSIS — I10 Essential (primary) hypertension: Secondary | ICD-10-CM

## 2022-10-12 LAB — GLUCOSE, CAPILLARY
Glucose-Capillary: 100 mg/dL — ABNORMAL HIGH (ref 70–99)
Glucose-Capillary: 105 mg/dL — ABNORMAL HIGH (ref 70–99)
Glucose-Capillary: 107 mg/dL — ABNORMAL HIGH (ref 70–99)
Glucose-Capillary: 109 mg/dL — ABNORMAL HIGH (ref 70–99)
Glucose-Capillary: 122 mg/dL — ABNORMAL HIGH (ref 70–99)
Glucose-Capillary: 135 mg/dL — ABNORMAL HIGH (ref 70–99)

## 2022-10-12 LAB — BASIC METABOLIC PANEL
Anion gap: 10 (ref 5–15)
BUN: 10 mg/dL (ref 8–23)
CO2: 25 mmol/L (ref 22–32)
Calcium: 9.7 mg/dL (ref 8.9–10.3)
Chloride: 103 mmol/L (ref 98–111)
Creatinine, Ser: 0.88 mg/dL (ref 0.61–1.24)
GFR, Estimated: >60 mL/min (ref >60)
Glucose, Bld: 128 mg/dL — ABNORMAL HIGH (ref 70–99)
Potassium: 4.1 mmol/L (ref 3.5–5.1)
Sodium: 138 mmol/L (ref 135–145)

## 2022-10-12 LAB — CBC
HCT: 41.8 % (ref 39.0–52.0)
Hemoglobin: 13.3 g/dL (ref 13.0–17.0)
MCH: 29.3 pg (ref 26.0–34.0)
MCHC: 31.8 g/dL (ref 30.0–36.0)
MCV: 92.1 fL (ref 80.0–100.0)
Platelets: 226 10*3/uL (ref 150–400)
RBC: 4.54 MIL/uL (ref 4.22–5.81)
RDW: 12.7 % (ref 11.5–15.5)
WBC: 46.2 10*3/uL — ABNORMAL HIGH (ref 4.0–10.5)
nRBC: 0 % (ref 0.0–0.2)

## 2022-10-12 NOTE — TOC Progression Note (Signed)
Transition of Care [facility]) - Progression Note    Patient Details  Name: Dustin Jones MRN: [id number] Date of Birth: 10-30-1961  Transition of Care [facility]) CM/SW Contact  Carley Hammed, [place] Phone Number: 10/12/2022, 12:16 PM  Clinical Narrative:     CSW notified by [facility] in [place] that they have a bed available and can accept his level of care. CSW followed up with daughter who is agreeable to discharge pt to facility. TOC supervisor advised, and will be provided with necessary paperwork, when received. TOC will continue to follow for DC needs.       Expected Discharge Plan and Services                                                 Social Determinants of Health (SDOH) Interventions    Readmission Risk Interventions     No data to display

## 2022-10-12 NOTE — Progress Notes (Signed)
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   9/28 admitted intracranial hemorrhage right MCA ruptured aneurysm status post CT head. 9/29: MRI brain large acute intraparenchymal hemorrhage extension into the right greater than left lateral ventricle, third ventricle and fourth ventricle. 9/29 developed fever and pneumonia, MSSA and respiratory culture white blood cell up to 56.  Treated. 10/15 transferred to the hospitalist service, transferred back to ICU for worsening respiratory status.  Was placed back on vent. 10/26; IVC filter and PEG tube placed by IR. 10/28: After conference call with family CODE STATUS was changed to DNR/DNI do not placed back on vent support. 11/01: Speech recommend Dysphagia 3 diet.  11/07 started Bactrim to cover for PNA.      Subjective/Interval History: Patient not very communicative.  Not sure if he fully comprehends my questions.  Does not appear to be in any discomfort or pain.    Assessment/Plan:  Acute Hypoxic Respiratory Failure secondary to Pneumonia Pneumonia due to MSSA and Enterobacter Status post tracheostomy in place on trach collar Patient has completed multiple courses of antibiotics. Trach management per critical care medicine. On 11/6 patient had low-grade fever.  Leukocytosis was noted from his baseline.  He was started on Bactrim to cover panel resistant Enterobacter cloacae noted previously. Chest  x-ray showed mild patchy opacities. Respiratory status noted to be stable.  Noted to be afebrile.  Patient does have significant leukocytosis as discussed below, which appears to be chronic. Respiratory cultures pending.  Gram stain showed gram-positive cocci in pairs and clusters, moderate gram-positive rods.  Gram-negative rods were noted.   Intracranial Hemorrhage right MCA rupture aneurysmal:  -Status post R ICA coil embolization of right ICA terminus aneurysm on Oct 01, 2022 Remains quite distracted.  He does have left-sided hemiparesis. Mental status appears to be stable for the most part.  Continue to monitor.  Dysphagia Status post PEG tube placed by IR 10/26 continue with tube feedings Speech therapy recommended dysphagia 3 diet 11/1. Now on nocturnal tube feeding.    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   Essential hypertension Blood pressure is reasonably well controlled.  Continue with amlodipine, hydralazine, lisinopril and metoprolol.     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 WBC fluctuates, 35--40.  Needs follow-up with hematology. Monitor.    Diabetes mellitus type 2 CBGs are reasonably well controlled.  Continue SSI.   Thrombocytopenia: Resolved Nonsustained V. tach: Continue with metoprolol. Normal Mg Rectal tube in place: Diarrhea resolving.  Oral thrush: Diflucan 11/6 for 5 days.  Wound care documentation below, pressure injury also pretibial right distal stage II POA: Continue with wound care  Obesity Class 3  Estimated body mass index is 40.43 kg/m as calculated from the following:   Height as of this encounter: 5\' 7"  (1.702 m).   Weight as of this encounter: 117.1 kg.  DVT Prophylaxis: Subcutaneous heparin Code Status: DNR Family Communication: No family at bedside Disposition Plan: To  be determined  Status is: Inpatient Remains inpatient  appropriate because: Unsafe disposition      Medications: Scheduled:  amLODipine  10 mg Per Tube Daily   arformoterol  15 mcg Nebulization BID   famotidine  20 mg Per Tube Daily   feeding supplement (JEVITY 1.5 CAL/FIBER)  1,000 mL Per Tube Q24H   fiber  1 packet Per Tube BID   fluconazole  100 mg Per Tube Daily   free water  200 mL Per Tube Q6H   heparin injection (subcutaneous)  5,000 Units Subcutaneous Q8H   hydrALAZINE  10 mg Per Tube BID   insulin aspart  0-15 Units Subcutaneous Q4H   lisinopril  40 mg Per Tube Daily   metoprolol tartrate  25 mg Per Tube BID   mouth rinse  15 mL Mouth Rinse BID   revefenacin  175 mcg Nebulization Daily   saccharomyces boulardii  250 mg Per Tube BID   sulfamethoxazole-trimethoprim  1 tablet Oral Q12H   Continuous:   ceFAZolin (ANCEF) IV     [id number] **OR** acetaminophen (TYLENOL) oral liquid 160 mg/5 mL **OR** acetaminophen, albuterol, fentaNYL (SUBLIMAZE) injection, mouth rinse, oxyCODONE  Antibiotics: Anti-infectives (From admission, onward)    Start     Dose/Rate Route Frequency Ordered Stop   10/11/22 1200  sulfamethoxazole-trimethoprim (BACTRIM DS) 800-160 MG per tablet 1 tablet        1 tablet Oral Every 12 hours 10/11/22 1106     10/10/22 1000  fluconazole (DIFLUCAN) 40 MG/ML suspension 100 mg        100 mg Per Tube Daily 10/10/22 0835 10/14/22 2359   10/05/22 1415  fluconazole (DIFLUCAN) 40 MG/ML suspension 100 mg  Status:  Discontinued        100 mg Oral Daily 10/05/22 1319 10/10/22 0835   09/29/22 1445  ceFAZolin (ANCEF) IVPB 2g/100 mL premix        2 g 200 mL/hr over 30 Minutes Intravenous  Once 09/29/22 1346     09/29/22 1145  ceFAZolin (ANCEF) IVPB 1 g/50 mL premix  Status:  Discontinued        1 g 100 mL/hr over 30 Minutes Intravenous To Radiology 09/29/22 1057 09/29/22 1059   09/29/22 1145  ceFAZolin (ANCEF) IVPB 2g/100 mL premix        2 g 200 mL/hr over 30 Minutes Intravenous  Once 09/29/22 1059 09/29/22  1159   09/14/22 1400  ceFEPIme (MAXIPIME) 2 g in sodium chloride 0.9 % 100 mL IVPB        2 g 200 mL/hr over 30 Minutes Intravenous Every 8 hours 09/14/22 1312 09/21/22 0922   09/10/22 0830  ceFAZolin (ANCEF) IVPB 1 g/50 mL premix  Status:  Discontinued        1 g 100 mL/hr over 30 Minutes Intravenous Every 8 hours 09/10/22 0738 09/10/22 0821   09/10/22 0830  ceFAZolin (ANCEF) IVPB 2g/100 mL premix  Status:  Discontinued        2 g 200 mL/hr over 30 Minutes Intravenous Every 8 hours 09/10/22 0821 09/14/22 1312   09/07/22 0815  ceFAZolin (ANCEF) IVPB 2g/100 mL premix        2 g 200 mL/hr over 30 Minutes Intravenous Every 8 hours 09/07/22 0807 09/08/22 1742   09/04/22 2200  Ampicillin-Sulbactam (UNASYN) 3 g in sodium chloride 0.9 % 100 mL IVPB  Status:  Discontinued        3 g 200 mL/hr over 30 Minutes Intravenous Every 24 hours 09/04/22 0722 09/04/22 1506  09/04/22 2200  ceFAZolin (ANCEF) IVPB 1 g/50 mL premix  Status:  Discontinued        1 g 100 mL/hr over 30 Minutes Intravenous Every 12 hours 09/04/22 1506 09/07/22 0807   09/02/22 1030  Ampicillin-Sulbactam (UNASYN) 3 g in sodium chloride 0.9 % 100 mL IVPB  Status:  Discontinued        3 g 200 mL/hr over 30 Minutes Intravenous Every 6 hours 09/02/22 1009 09/04/22 0722       Objective:  Vital Signs  Vitals:   10/12/22 0737 10/12/22 0759 10/12/22 1057 10/12/22 1119  BP:  136/80  (!) 149/86  Pulse: 92 65 70 83  Resp: 16 16 16 16   Temp:  98 F (36.7 C)  98.5 F (36.9 C)  TempSrc:  Oral  Oral  SpO2: 99% 100% 99% 93%  Weight:      Height:        Intake/Output Summary (Last 24 hours) at 10/12/2022 1136 Last data filed at 10/11/2022 1232 Gross per 24 hour  Intake 480 ml  Output --  Net 480 ml   Filed Weights   10/05/22 0500 10/09/22 0536 10/11/22 0500  Weight: 119 kg 117.5 kg 117.1 kg    General appearance: Awake alert.  In no distress.  Distracted Tracheostomy noted. Resp: Normal effort at rest.  Coarse breath  sound bilaterally.  No wheezing or rhonchi. Cardio: S1-S2 is normal regular.  No S3-S4.  No rubs murmurs or bruit GI: Abdomen is soft.  Nontender nondistended.  Bowel sounds are present normal.  No masses organomegaly Extremities: No edema.    Lab Results:  Data Reviewed: I have personally reviewed following labs and reports of the imaging studies  CBC: Recent Labs  Lab 10/07/22 0332 10/09/22 0255 10/11/22 0347 10/12/22 0534  WBC 33.7* 41.7* 46.0* 46.2*  HGB 12.1* 13.0 12.9* 13.3  HCT 38.8* 39.6 39.3 41.8  MCV 94.6 91.2 90.6 92.1  PLT 145* 175 209 [id number]    Basic Metabolic Panel: Recent Labs  Lab 10/06/22 0706 10/07/22 0332 10/08/22 0805 10/09/22 0255 10/11/22 0347 10/12/22 0534  NA 141 137 138 136 138 138  K 3.8 3.4* 3.9 4.0 3.7 4.1  CL 101 101 102 101 102 103  CO2 29 28 27 26 27 25   GLUCOSE 131* 135* 112* 106* 128* 128*  BUN 19 14 8 12 9 10   CREATININE 0.79 0.78 0.63 0.83 0.89 0.88  CALCIUM 9.5 9.0 9.3 9.4 9.5 9.7  MG 2.1 1.8 2.0  --   --   --     GFR: Estimated Creatinine Clearance: 107.9 mL/min (by C-G formula based on SCr of 0.88 mg/dL).  CBG: Recent Labs  Lab 10/11/22 1959 10/11/22 2328 10/12/22 0444 10/12/22 0803 10/12/22 1121  GLUCAP 103* 124* 135* 107* 122*     Recent Results (from the past 240 hour(s))  Expectorated Sputum Assessment w Gram Stain, Rflx to Resp Cult     Status: None   Collection Time: 10/11/22 10:58 AM   Specimen: Tracheal Aspirate; Sputum  Result Value Ref Range Status   Specimen Description EXPECTORATED SPUTUM  Final   Special Requests NONE  Final   Sputum evaluation   Final    THIS SPECIMEN IS ACCEPTABLE FOR SPUTUM CULTURE Performed at [facility], [place] [address], [place], [place] [address]    Report Status 10/11/2022 FINAL  Final  Culture, Respiratory w Gram Stain     Status: None (Preliminary result)   Collection Time: 10/11/22 10:58 AM  Result Value Ref Range Status   Specimen Description EXPECTORATED SPUTUM   Final   Special Requests NONE Reflexed from [id number]  Final   Gram Stain   Final    ABUNDANT WBC PRESENT, PREDOMINANTLY PMN MODERATE GRAM POSITIVE COCCI IN PAIRS IN CLUSTERS MODERATE GRAM POSITIVE RODS RARE GRAM NEGATIVE RODS    Culture   Final    CULTURE REINCUBATED FOR BETTER GROWTH Performed at [facility], [place] [address], [place], [place] [address]    Report Status PENDING  Incomplete      Radiology Studies: DG Chest 1 View  Result Date: 10/11/2022 CLINICAL DATA:  Fever, history of trach. EXAM: CHEST  1 VIEW COMPARISON:  Chest x-ray September 25, 2022. FINDINGS: Low lung volumes with increased mild patchy opacities bilaterally. No visible pleural effusions or pneumothorax. Cardiomediastinal silhouette is accentuated by technique. Tracheostomy 2 tip projects over the tracheal air column at the level of the clavicular heads. IMPRESSION: Low lung volumes with increased mild patchy opacities bilaterally, which could represent atelectasis, aspiration, and/or pneumonia. Electronically Signed   By: Margaretha Sheffield M.D.   On: 10/11/2022 10:02       LOS: 41 days   Jacia Sickman [employer] Pager on [email or web address]  10/12/2022, 11:36 AM

## 2022-10-12 NOTE — Progress Notes (Signed)
Mobility Specialist: Progress Note   10/12/22 1110  Mobility  Activity Stood at bedside  Level of Assistance +2 (takes two people)  Assistive Device Stedy  Activity Response Tolerated well  Mobility Referral Yes  $Mobility charge 1 Mobility   Pt received in the bed and agreeable to mobility. MinA with bed mobility as well as with standing. Pt better able to follow cues for upright posture today with utilization of mirror. Less R UE and LE extension today. Pt able to stand x3 from the bed for roughly one minute each bout. Pt back in bed with call bell at his side.   Jesika Men Mobility Specialist Secure Chat Only

## 2022-10-13 LAB — GLUCOSE, CAPILLARY
Glucose-Capillary: 133 mg/dL — ABNORMAL HIGH (ref 70–99)
Glucose-Capillary: 110 mg/dL — ABNORMAL HIGH (ref 70–99)
Glucose-Capillary: 118 mg/dL — ABNORMAL HIGH (ref 70–99)
Glucose-Capillary: 112 mg/dL — ABNORMAL HIGH (ref 70–99)
Glucose-Capillary: 118 mg/dL — ABNORMAL HIGH (ref 70–99)
Glucose-Capillary: 129 mg/dL — ABNORMAL HIGH (ref 70–99)

## 2022-10-13 NOTE — Progress Notes (Signed)
Occupational Therapy Treatment Patient Details Name: Dustin Jones MRN: [id number] DOB: [birth year] Today's Date: 10/13/2022   History of present illness The pt is a 61 y.o. unidentified homeless male presenting 9/28 after being found down by bystanders. 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 S/P coil embolization of R ICA aneurysm 9/28. Intubated 9/29. Trach placement 10/10.  Back to ICU 10/15 for respiratory failure, had IVC filter placed 10/26 and PEG.  No PMH on file.   OT comments  Pt progressing towards established OT goals. Pt with greater attention to R side of environment this session, and greater attempts to actively use LUE. Pt performing bed mobility with up to mod A. Performing STS transfers 4x with mod A +2 and requiring mod-max A +2 and mod+ cues to achieve midline standing. Use of mirror for visual cueing to achieve midline as well. Pt requiring mod A to bring L hand up to grasp bar on stedy. Weightbearing through LUE while reaching with RUE to decrease pushing toward L. Continuing to recommend SNF for discharge to optimize safety and independence in ADL and IADL.    Recommendations for follow up therapy are one component of a multi-disciplinary discharge planning process, led by the attending physician.  Recommendations may be updated based on patient status, additional functional criteria and insurance authorization.    Follow Up Recommendations  Skilled nursing-short term rehab (<3 hours/day)    Assistance Recommended at Discharge Frequent or constant Supervision/Assistance  Patient can return home with the following  Two people to help with walking and/or transfers;Two people to help with bathing/dressing/bathroom;Help with stairs or ramp for entrance;Assist for transportation;Direct supervision/assist for financial management;Direct supervision/assist for  medications management;Assistance with cooking/housework   Equipment Recommendations  Other (comment) (defer to next venue)    Recommendations for Other Services      Precautions / Restrictions Precautions Precautions: Fall Precaution Comments: trach, PEG Restrictions Weight Bearing Restrictions: No       Mobility Bed Mobility Overal bed mobility: Needs Assistance Bed Mobility: Rolling, Sidelying to Sit, Sit to Sidelying Rolling: Min assist (toward L) Sidelying to sit: Min assist     Sit to sidelying: Min assist General bed mobility comments: Min A for elevation of trunk OOB and mod A for bring BLE into bed following session    Transfers Overall transfer level: Needs assistance Equipment used: Ambulation equipment used Transfers: Sit to/from Stand Sit to Stand: Mod assist, +2 physical assistance, +2 safety/equipment           General transfer comment: Mod A to power up to standing. Mod A +2 to maintain standing balance. Pt performing 4 STS transfers to stedy     Balance Overall balance assessment: Needs assistance Sitting-balance support: Feet supported Sitting balance-Leahy Scale: Fair Sitting balance - Comments: sditting EOB with feet supported and intermittent UE support   Standing balance support: Bilateral upper extremity supported Standing balance-Leahy Scale: Zero Standing balance comment: Pushing R and needing mod A to mainatin midline                           ADL either performed or assessed with clinical judgement   ADL Overall ADL's : Needs assistance/impaired     Grooming: Moderate assistance;Standing Grooming Details (indicate cue type and reason): Washing face with RUE. Requiring mod A for standing balance.  Toilet Transfer: Moderate assistance;+2 for physical assistance;+2 for safety/equipment (bed<>stedy)           Functional mobility during ADLs: Moderate assistance;+2 for physical assistance;+2 for  safety/equipment Antony Salmon)      Extremity/Trunk Assessment Upper Extremity Assessment Upper Extremity Assessment: LUE deficits/detail;RUE deficits/detail RUE Deficits / Details: decreased strength and coordination. Able to reach face LUE Deficits / Details: 2+/5 shoulder flexion. requires A to bring hand onto bar of steady when seated. ~20 degrees against gravity shoulder flexion without A   Lower Extremity Assessment Lower Extremity Assessment: Defer to PT evaluation        Vision   Vision Assessment?: Vision impaired- to be further tested in functional context   Perception     Praxis      Cognition Arousal/Alertness: Awake/alert Behavior During Therapy: Flat affect Overall Cognitive Status: Impaired/Different from baseline Area of Impairment: Attention, Following commands, Problem solving, Safety/judgement                   Current Attention Level: Sustained   Following Commands: Follows one step commands with increased time, Follows one step commands inconsistently Safety/Judgement: Decreased awareness of deficits Awareness:  (Decreased awareness of R side of environment.) Problem Solving: Slow processing, Decreased initiation, Requires verbal cues General Comments: Pt with 3+ verbalizations during session, and 4+ verbalizations following session when OT asking RN if okay to use PMSV. Pt following all one step commands with increased time and inconsistently following 2 step commands. Very motivated to participate in therapy.        Exercises Exercises: Other exercises Other Exercises Other Exercises: STSx4 with standing ~45 seconds each attempt. Other Exercises: Weight shift toward R in standing with cues to bring shoulders to midline Other Exercises: Reaching with RUE to mirror to make marks    Shoulder Instructions       General Comments HR max 121; BP stable    Pertinent Vitals/ Pain       Pain Assessment Pain Assessment: No/denies pain  Home Living                                           Prior Functioning/Environment              Frequency  Min 2X/week        Progress Toward Goals  OT Goals(current goals can now be found in the care plan section)  Progress towards OT goals: Progressing toward goals  Acute Rehab OT Goals Patient Stated Goal: none stated OT Goal Formulation: With patient Time For Goal Achievement: 10/21/22 Potential to Achieve Goals: Fair ADL Goals Pt Will Perform Grooming: sitting;with mod assist Pt Will Perform Upper Body Dressing: with mod assist;sitting Pt Will Perform Lower Body Dressing: with max assist;sitting/lateral leans Pt Will Transfer to Toilet: with mod assist;with +2 assist;bedside commode;stand pivot transfer Additional ADL Goal #1: Pt will maintain static sitting EOB with min guard A for 5 mins in preparation for ADL Additional ADL Goal #2: Pt will demonstrate increased attention to R side as indicated by visually tracking therapist toward R 4/4 attempts  Plan Discharge plan remains appropriate    Co-evaluation                 AM-PAC OT "6 Clicks" Daily Activity     Outcome Measure   Help from another person eating meals?: Total Help from another person taking care  of personal grooming?: A Lot Help from another person toileting, which includes using toliet, bedpan, or urinal?: Total Help from another person bathing (including washing, rinsing, drying)?: A Lot Help from another person to put on and taking off regular upper body clothing?: A Lot Help from another person to put on and taking off regular lower body clothing?: Total 6 Click Score: 9    End of Session Equipment Utilized During Treatment: Gait belt (stedy)  OT Visit Diagnosis: Muscle weakness (generalized) (M62.81);Other symptoms and signs involving the nervous system (R29.898);Other symptoms and signs involving cognitive function   Activity Tolerance Patient tolerated treatment well    Patient Left in bed;with call bell/phone within reach;with bed alarm set   Nurse Communication Mobility status;Other (comment) (PMSV trial with RN present)        Time: [id number] OT Time Calculation (min): 37 min  Charges: OT General Charges $OT Visit: 1 Visit OT Treatments $Self Care/Home Management : 8-22 mins $Therapeutic Activity: 8-22 mins  Shanda Howells, OTR/L [facility] Acute Rehabilitation Office: [phone]   Lula Olszewski 10/13/2022, 5:47 PM

## 2022-10-13 NOTE — TOC Progression Note (Addendum)
Transition of Care [facility]) - Progression Note    Patient Details  Name: Dustin Jones MRN: [id number] Date of Birth: 1961/04/10  [facility] CM/SW Contact  Carley Hammed, [place] Phone Number: 10/13/2022, 12:22 PM  Clinical Narrative:     [facility] supervision reviewing discharge plan for approval, no update available yet. TOC will continue to follow for DC needs.   3:30 CSW received phone call from pt's daughter Dustin Jones, who wanted to discuss concerns regarding the pending placement for her father. Dustin Jones is concerned about the distance and the lack of family support he will have when he discharges. CSW explained pt's barriers and that facilities closer to home have been unable to offer. She was advised that CSW continues to search, and pt has not been approved by the department yet, but her concerns are ones that are reasonable. Support given and dtr notes understanding. Plan to meet with dtr in room tomorrow to answer any questions and provide updates. TOC will continue to follow.  Dtr requests information on pt only be released to herself and brother. Contact for friend deactivated from contact list. CSW to notify staff.    Expected Discharge Plan and Services                                                 Social Determinants of Health (SDOH) Interventions    Readmission Risk Interventions     No data to display

## 2022-10-13 NOTE — Progress Notes (Signed)
[facility] PROGRESS NOTE   Dustin Jones [id number] DOB: Sep 15, 1961 DOA: 08/09/2022  PCP: Marliss Coots, NP  Brief History/Interval Summary: 61 year old with past medical history significant for hypertension, history of DVT admitted 9/28 as a code stroke, admitted to ICU with working diagnosis of intracranial hemorrhage right MCA ruptured aneurysm, has had prolonged hospitalization including multiple procedures, work-up including right ICA coil embolization of right ICA terminus aneurysm on 08/26/2022, tracheostomy by CCM on 10/10 and transferred to [facility] on 10/22.   9/28 admitted intracranial hemorrhage right MCA ruptured aneurysm status post CT head. 9/29: MRI brain large acute intraparenchymal hemorrhage extension into the right greater than left lateral ventricle, third ventricle and fourth ventricle. 9/29 developed fever and pneumonia, MSSA and respiratory culture white blood cell up to 56.  Treated. 10/15 transferred to the hospitalist service, transferred back to ICU for worsening respiratory status.  Was placed back on vent. 10/26; IVC filter and PEG tube placed by IR. 10/28: After conference call with family CODE STATUS was changed to DNR/DNI do not placed back on vent support. 11/01: Speech recommend Dysphagia 3 diet.  11/07 started Bactrim to cover for PNA.     Subjective/Interval History: Patient not very communicative but he does follow commands.      Assessment/Plan:  Acute Hypoxic Respiratory Failure secondary to Pneumonia Pneumonia due to MSSA and Enterobacter Status post tracheostomy in place on trach collar Patient has completed multiple courses of antibiotics. Trach management per critical care medicine.  Last seen by pulmonology on 11/6.  They mentioned that he is not a candidate for decannulation.  Recommended continuing routine trach care. On 11/6 patient had low-grade fever.  Leukocytosis was noted from his baseline.  He was started on Bactrim to  cover panel resistant Enterobacter cloacae noted previously. Chest x-ray showed mild patchy opacities. Respiratory status is stable.  He remains afebrile. Patient does have significant leukocytosis as discussed below, which appears to be chronic. Respiratory cultures pending.  Gram stain showed gram-positive cocci in pairs and clusters, moderate gram-positive rods.  Gram-negative rods were noted.  Moderate Staph aureus noted.  Waiting on final identification and sensitivities.  Continue Bactrim for now.   Intracranial Hemorrhage right MCA rupture aneurysmal:  Status post R ICA coil embolization of right ICA terminus aneurysm on 08/16/2022 Remains quite distracted.  He does have left-sided hemiparesis. Mental status appears to be stable for the most part.  Continue to monitor.  Dysphagia Status post PEG tube placed by IR 10/26 continue with tube feedings Speech therapy recommended dysphagia 3 diet 11/1. Now on nocturnal tube feeding.    Bilateral DVTs Not a candidate for anticoagulation due to [place], status post IVC filter in 09/29/2022 by IR.   Essential hypertension Blood pressure is reasonably well controlled.  Continue with amlodipine, hydralazine, lisinopril and metoprolol.     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 WBC fluctuates, 35--40.  Needs follow-up with hematology.   Diabetes mellitus type 2 CBGs are reasonably well controlled.  Continue SSI.   Thrombocytopenia: Resolved Nonsustained V. tach: Continue with metoprolol. Normal Mg Rectal tube in place: Diarrhea resolving.  Oral thrush: Diflucan 11/6 for 5 days.   Obesity Class 3  Estimated body mass index is 40.43 kg/m as calculated from the following:   Height as of this encounter: 5\' 7"  (1.702 m).   Weight as of this encounter: 117.1 kg.  DVT Prophylaxis: Subcutaneous heparin Code Status: DNR Family Communication: No family at  bedside Disposition Plan: It appears the plan is for  him to go to skilled nursing facility in [place]  Status is: Inpatient Remains inpatient appropriate because: Unsafe disposition      Medications: Scheduled:  amLODipine  10 mg Per Tube Daily   arformoterol  15 mcg Nebulization BID   famotidine  20 mg Per Tube Daily   feeding supplement (JEVITY 1.5 CAL/FIBER)  1,000 mL Per Tube Q24H   fiber  1 packet Per Tube BID   fluconazole  100 mg Per Tube Daily   free water  200 mL Per Tube Q6H   heparin injection (subcutaneous)  5,000 Units Subcutaneous Q8H   hydrALAZINE  10 mg Per Tube BID   insulin aspart  0-15 Units Subcutaneous Q4H   lisinopril  40 mg Per Tube Daily   metoprolol tartrate  25 mg Per Tube BID   mouth rinse  15 mL Mouth Rinse BID   revefenacin  175 mcg Nebulization Daily   saccharomyces boulardii  250 mg Per Tube BID   sulfamethoxazole-trimethoprim  1 tablet Oral Q12H   Continuous:   [id number] **OR** acetaminophen (TYLENOL) oral liquid 160 mg/5 mL **OR** acetaminophen, albuterol, fentaNYL (SUBLIMAZE) injection, mouth rinse, oxyCODONE  Antibiotics: Anti-infectives (From admission, onward)    Start     Dose/Rate Route Frequency Ordered Stop   10/11/22 1200  sulfamethoxazole-trimethoprim (BACTRIM DS) 800-160 MG per tablet 1 tablet        1 tablet Oral Every 12 hours 10/11/22 1106     10/10/22 1000  fluconazole (DIFLUCAN) 40 MG/ML suspension 100 mg        100 mg Per Tube Daily 10/10/22 0835 10/14/22 2359   10/05/22 1415  fluconazole (DIFLUCAN) 40 MG/ML suspension 100 mg  Status:  Discontinued        100 mg Oral Daily 10/05/22 1319 10/10/22 0835   09/29/22 1445  ceFAZolin (ANCEF) IVPB 2g/100 mL premix  Status:  Discontinued        2 g 200 mL/hr over 30 Minutes Intravenous  Once 09/29/22 1346 10/12/22 1148   09/29/22 1145  ceFAZolin (ANCEF) IVPB 1 g/50 mL premix  Status:  Discontinued        1 g 100 mL/hr over 30 Minutes Intravenous To Radiology 09/29/22 1057 09/29/22 1059   09/29/22 1145  ceFAZolin (ANCEF)  IVPB 2g/100 mL premix        2 g 200 mL/hr over 30 Minutes Intravenous  Once 09/29/22 1059 09/29/22 1159   09/14/22 1400  ceFEPIme (MAXIPIME) 2 g in sodium chloride 0.9 % 100 mL IVPB        2 g 200 mL/hr over 30 Minutes Intravenous Every 8 hours 09/14/22 1312 09/21/22 0922   09/10/22 0830  ceFAZolin (ANCEF) IVPB 1 g/50 mL premix  Status:  Discontinued        1 g 100 mL/hr over 30 Minutes Intravenous Every 8 hours 09/10/22 0738 09/10/22 0821   09/10/22 0830  ceFAZolin (ANCEF) IVPB 2g/100 mL premix  Status:  Discontinued        2 g 200 mL/hr over 30 Minutes Intravenous Every 8 hours 09/10/22 0821 09/14/22 1312   09/07/22 0815  ceFAZolin (ANCEF) IVPB 2g/100 mL premix        2 g 200 mL/hr over 30 Minutes Intravenous Every 8 hours 09/07/22 0807 09/08/22 1742   09/04/22 2200  Ampicillin-Sulbactam (UNASYN) 3 g in sodium chloride 0.9 % 100 mL IVPB  Status:  Discontinued        3 g  200 mL/hr over 30 Minutes Intravenous Every 24 hours 09/04/22 0722 09/04/22 1506   09/04/22 2200  ceFAZolin (ANCEF) IVPB 1 g/50 mL premix  Status:  Discontinued        1 g 100 mL/hr over 30 Minutes Intravenous Every 12 hours 09/04/22 1506 09/07/22 0807   09/02/22 1030  Ampicillin-Sulbactam (UNASYN) 3 g in sodium chloride 0.9 % 100 mL IVPB  Status:  Discontinued        3 g 200 mL/hr over 30 Minutes Intravenous Every 6 hours 09/02/22 1009 09/04/22 0722       Objective:  Vital Signs  Vitals:   10/13/22 0315 10/13/22 0336 10/13/22 0740 10/13/22 0854  BP:  129/80  (!) 143/91  Pulse: 64 61 97 100  Resp: 13 13 18 19   Temp:  98.4 F (36.9 C)  98.9 F (37.2 C)  TempSrc:  Oral  Oral  SpO2: 100% 94%  100%  Weight:      Height:        Intake/Output Summary (Last 24 hours) at 10/13/2022 1033 Last data filed at 10/13/2022 0257 Gross per 24 hour  Intake --  Output 700 ml  Net -700 ml    Filed Weights   10/05/22 0500 10/09/22 0536 10/11/22 0500  Weight: 119 kg 117.5 kg 117.1 kg    General appearance: Awake  alert.  In no distress.  Distracted. Tracheostomy noted Resp: Clear to auscultation bilaterally.  Normal effort Cardio: S1-S2 is normal regular.  No S3-S4.  No rubs murmurs or bruit GI: Abdomen is soft.  PEG tube is noted.  Abdomen is nontender. Extremities: No edema.  Noted to be moving all of his extremities   Lab Results:  Data Reviewed: I have personally reviewed following labs and reports of the imaging studies  CBC: Recent Labs  Lab 10/07/22 0332 10/09/22 0255 10/11/22 0347 10/12/22 0534  WBC 33.7* 41.7* 46.0* 46.2*  HGB 12.1* 13.0 12.9* 13.3  HCT 38.8* 39.6 39.3 41.8  MCV 94.6 91.2 90.6 92.1  PLT 145* 175 209 226     Basic Metabolic Panel: Recent Labs  Lab 10/07/22 0332 10/08/22 0805 10/09/22 0255 10/11/22 0347 10/12/22 0534  NA 137 138 136 138 138  K 3.4* 3.9 4.0 3.7 4.1  CL 101 102 101 102 103  CO2 28 27 26 27 25   GLUCOSE 135* 112* 106* 128* 128*  BUN 14 8 12 9 10   CREATININE 0.78 0.63 0.83 0.89 0.88  CALCIUM 9.0 9.3 9.4 9.5 9.7  MG 1.8 2.0  --   --   --      GFR: Estimated Creatinine Clearance: 107.9 mL/min (by C-G formula based on SCr of 0.88 mg/dL).  CBG: Recent Labs  Lab 10/12/22 1604 10/12/22 1947 10/12/22 2338 10/13/22 0334 10/13/22 0859  GLUCAP 100* 105* 109* 133* 110*      Recent Results (from the past 240 hour(s))  Expectorated Sputum Assessment w Gram Stain, Rflx to Resp Cult     Status: None   Collection Time: 10/11/22 10:58 AM   Specimen: Tracheal Aspirate; Sputum  Result Value Ref Range Status   Specimen Description EXPECTORATED SPUTUM  Final   Special Requests NONE  Final   Sputum evaluation   Final    THIS SPECIMEN IS ACCEPTABLE FOR SPUTUM CULTURE Performed at [facility], [place] [address], [place], [place] [address]    Report Status 10/11/2022 FINAL  Final  Culture, Respiratory w Gram Stain     Status: None (Preliminary result)  Collection Time: 10/11/22 10:58 AM  Result Value Ref Range Status   Specimen  Description EXPECTORATED SPUTUM  Final   Special Requests NONE Reflexed from [id number]  Final   Gram Stain   Final    ABUNDANT WBC PRESENT, PREDOMINANTLY PMN MODERATE GRAM POSITIVE COCCI IN PAIRS IN CLUSTERS MODERATE GRAM POSITIVE RODS RARE GRAM NEGATIVE RODS    Culture   Final    MODERATE STAPHYLOCOCCUS AUREUS SUSCEPTIBILITIES TO FOLLOW Performed at [facility], [address] [place], [place] [address]    Report Status PENDING  Incomplete      Radiology Studies: No results found.     LOS: 42 days   Quetzal Meany [employer] on [email or web address]  10/13/2022, 10:33 AM

## 2022-10-13 NOTE — Progress Notes (Signed)
Patient IV accidentally dislodged upon assessment. Dressing had been reinforced earlier in the day but patient with frequent movement in the bed. MD notified of loss of IV and ok with not replacing IV at this time.

## 2022-10-14 DIAGNOSIS — J15211 Pneumonia due to Methicillin susceptible Staphylococcus aureus: Secondary | ICD-10-CM

## 2022-10-14 DIAGNOSIS — R1312 Dysphagia, oropharyngeal phase: Secondary | ICD-10-CM

## 2022-10-14 LAB — BASIC METABOLIC PANEL
Anion gap: 9 (ref 5–15)
BUN: 13 mg/dL (ref 8–23)
CO2: 24 mmol/L (ref 22–32)
Calcium: 9.4 mg/dL (ref 8.9–10.3)
Chloride: 102 mmol/L (ref 98–111)
Creatinine, Ser: 1.33 mg/dL — ABNORMAL HIGH (ref 0.61–1.24)
GFR, Estimated: >60 mL/min (ref >60)
Glucose, Bld: 116 mg/dL — ABNORMAL HIGH (ref 70–99)
Potassium: 4.1 mmol/L (ref 3.5–5.1)
Sodium: 135 mmol/L (ref 135–145)

## 2022-10-14 LAB — CBC
HCT: 41 % (ref 39.0–52.0)
Hemoglobin: 13 g/dL (ref 13.0–17.0)
MCH: 29.8 pg (ref 26.0–34.0)
MCHC: 31.7 g/dL (ref 30.0–36.0)
MCV: 94 fL (ref 80.0–100.0)
Platelets: 255 10*3/uL (ref 150–400)
RBC: 4.36 MIL/uL (ref 4.22–5.81)
RDW: 12.7 % (ref 11.5–15.5)
WBC: 53.9 10*3/uL (ref 4.0–10.5)
nRBC: 0 % (ref 0.0–0.2)

## 2022-10-14 LAB — CULTURE, RESPIRATORY W GRAM STAIN

## 2022-10-14 LAB — GLUCOSE, CAPILLARY
Glucose-Capillary: 106 mg/dL — ABNORMAL HIGH (ref 70–99)
Glucose-Capillary: 116 mg/dL — ABNORMAL HIGH (ref 70–99)
Glucose-Capillary: 108 mg/dL — ABNORMAL HIGH (ref 70–99)
Glucose-Capillary: 125 mg/dL — ABNORMAL HIGH (ref 70–99)
Glucose-Capillary: 99 mg/dL (ref 70–99)

## 2022-10-14 LAB — MAGNESIUM: Magnesium: 2.2 mg/dL (ref 1.7–2.4)

## 2022-10-14 MED ORDER — FREE WATER
300.0000 mL | Status: DC
  Administered 2022-10-14 – 2022-10-18 (×25): 300 mL

## 2022-10-14 NOTE — Progress Notes (Signed)
Physical Therapy Treatment Patient Details Name: Dustin Jones MRN: [id number] DOB: [birth year] Today's Date: 10/14/2022   History of Present Illness The pt is a 61 y.o. unidentified homeless male presenting 9/28 after being found down by bystanders. 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 S/P coil embolization of R ICA aneurysm 9/28. Intubated 9/29. Trach placement 10/10.  Back to ICU 10/15 for respiratory failure, had IVC filter placed 10/26 and PEG.  No PMH on file.    PT Comments    Pt seen for second session to progress gait as pt motivated and eager for OOB mobility. Pt able to come to stand to EVA walker with min assist +2 from EOB and complete gait with mod assist +2. Pt with very narrow BOS needing multimodal cues and assist to maintain wider BOS as pt stepping on own feet and facilitation to advance Les. Pt able to stand pivot from recliner with HHA/mod assist of 2 at end of session. Pt continues to benefit from skilled PT services to progress toward functional mobility goals.    Recommendations for follow up therapy are one component of a multi-disciplinary discharge planning process, led by the attending physician.  Recommendations may be updated based on patient status, additional functional criteria and insurance authorization.  Follow Up Recommendations  Skilled nursing-short term rehab (<3 hours/day) Can patient physically be transported by private vehicle: No   Assistance Recommended at Discharge Frequent or constant Supervision/Assistance  Patient can return home with the following Two people to help with walking and/or transfers;Two people to help with bathing/dressing/bathroom;Assistance with cooking/housework;Assistance with feeding;Direct supervision/assist for medications management;Direct supervision/assist for financial management;Help with stairs or ramp  for entrance;Assist for transportation   Equipment Recommendations  Other (comment) (TBD)    Recommendations for Other Services       Precautions / Restrictions Precautions Precautions: Fall Precaution Comments: trach, PEG Restrictions Weight Bearing Restrictions: No     Mobility  Bed Mobility Overal bed mobility: Needs Assistance Bed Mobility: Rolling, Sidelying to Sit, Sit to Sidelying Rolling: Min assist (toward L) Sidelying to sit: Min assist     Sit to sidelying: Min assist General bed mobility comments: Min A for elevation of trunk OOB and mod A for bring BLE into bed following session    Transfers Overall transfer level: Needs assistance Equipment used: 2 person hand held assist, Bilateral platform walker (EVA walker) Transfers: Sit to/from Stand, Bed to chair/wheelchair/BSC Sit to Stand: Min assist, +2 physical assistance Stand pivot transfers: Mod assist, +2 physical assistance         General transfer comment: min assist to come to stand with EVA walker from EOB with pt demonstrating good power up, mod asssit to stand pivot from recliner to EOB at end of session    Ambulation/Gait Ambulation/Gait assistance: Mod assist, +2 physical assistance Gait Distance (Feet): 20 Feet Assistive device: Bilateral platform walker (EVA walker) Gait Pattern/deviations: Step-through pattern, Decreased stride length, Narrow base of support       General Gait Details: very narrow BOS needing this PTAs foot between feet to maintain stance and to asssit to advance LE.   Stairs             Wheelchair Mobility    Modified Rankin (Stroke Patients Only) Modified Rankin (Stroke Patients Only) Pre-Morbid Rankin Score: No symptoms Modified Rankin: Severe disability     Balance Overall balance assessment: Needs assistance Sitting-balance support: Feet supported Sitting  balance-Leahy Scale: Fair Sitting balance - Comments: sditting EOB with feet supported and  intermittent UE support   Standing balance support: Bilateral upper extremity supported Standing balance-Leahy Scale: Poor Standing balance comment: Pushing R and needing mod A to mainatin midline                            Cognition Arousal/Alertness: Awake/alert Behavior During Therapy: Flat affect Overall Cognitive Status: Impaired/Different from baseline Area of Impairment: Attention, Following commands, Problem solving, Safety/judgement                   Current Attention Level: Sustained   Following Commands: Follows one step commands with increased time, Follows one step commands inconsistently Safety/Judgement: Decreased awareness of deficits Awareness:  (Decreased awareness of R side of environment.) Problem Solving: Slow processing, Decreased initiation, Requires verbal cues General Comments: Pt with verbalizations during session,  Pt following all one step commands with increased time and inconsistently following 2 step commands. Very motivated to participate in therapy.        Exercises Other Exercises Other Exercises: STSx4 with standing ~45 seconds each attempt. Other Exercises: Weight shift toward R in standing with cues to bring shoulders to midline Other Exercises: Reaching with RUE to mirror to make marks Other Exercises: marching LLE    General Comments        Pertinent Vitals/Pain Pain Assessment Pain Assessment: No/denies pain Pain Intervention(s): Monitored during session    Home Living                          Prior Function            PT Goals (current goals can now be found in the care plan section) Acute Rehab PT Goals PT Goal Formulation: Patient unable to participate in goal setting Time For Goal Achievement: 10/20/22    Frequency    Min 2X/week      PT Plan Current plan remains appropriate    Co-evaluation              AM-PAC PT "6 Clicks" Mobility   Outcome Measure  Help needed turning  from your back to your side while in a flat bed without using bedrails?: Total Help needed moving from lying on your back to sitting on the side of a flat bed without using bedrails?: Total Help needed moving to and from a bed to a chair (including a wheelchair)?: A Lot Help needed standing up from a chair using your arms (e.g., wheelchair or bedside chair)?: A Lot Help needed to walk in hospital room?: Total Help needed climbing 3-5 steps with a railing? : Total 6 Click Score: 8    End of Session Equipment Utilized During Treatment: Gait belt Activity Tolerance: Patient tolerated treatment well Patient left: with call bell/phone within reach;in bed Nurse Communication: Mobility status PT Visit Diagnosis: Other abnormalities of gait and mobility (R26.89);Hemiplegia and hemiparesis;Other symptoms and signs involving the nervous system (R29.898);Muscle weakness (generalized) (M62.81) Hemiplegia - Right/Left: Left Hemiplegia - dominant/non-dominant: Non-dominant Hemiplegia - caused by: Nontraumatic intracerebral hemorrhage     Time: 6270-3500 PT Time Calculation (min) (ACUTE ONLY): 13 min  Charges:  $Gait Training: 8-22 mins                     Lindell Tussey R. PTA Acute Rehabilitation Services Office: [phone]    Catalina Antigua 10/14/2022, 4:45 PM

## 2022-10-14 NOTE — Progress Notes (Signed)
Patient seen today by trach team for consult.  No education is needed at this time.  All the necessary equipment is at the beside.   Will continue to follow for progression.  

## 2022-10-14 NOTE — Progress Notes (Signed)
[facility] PROGRESS NOTE   Dustin Jones WUJ:[id number] DOB: Nov 30, 1961 DOA: 11-Sep-2022  PCP: Lavinia Sharps, NP  Brief History/Interval Summary: 61 year old with past medical history significant for hypertension, history of DVT admitted 9/28 as a code stroke, admitted to ICU with working diagnosis of intracranial hemorrhage right MCA ruptured aneurysm, has had prolonged hospitalization including multiple procedures, work-up including right ICA coil embolization of right ICA terminus aneurysm on 09-11-22, tracheostomy by CCM on 10/10 and transferred to [facility] on 10/22.   9/28 admitted intracranial hemorrhage right MCA ruptured aneurysm status post CT head. 9/29: MRI brain large acute intraparenchymal hemorrhage extension into the right greater than left lateral ventricle, third ventricle and fourth ventricle. 9/29 developed fever and pneumonia, MSSA and respiratory culture white blood cell up to 56.  Treated. 10/15 transferred to the hospitalist service, transferred back to ICU for worsening respiratory status.  Was placed back on vent. 10/26; IVC filter and PEG tube placed by IR. 10/28: After conference call with family CODE STATUS was changed to DNR/DNI do not placed back on vent support. 11/01: Speech recommend Dysphagia 3 diet.  11/07 started Bactrim to cover for PNA.     Subjective/Interval History: Patient seems to be stable.  He denies any complaints.  Comfortably lying on the bed.     Assessment/Plan:  Acute Hypoxic Respiratory Failure secondary to Pneumonia Pneumonia due to MSSA and Enterobacter Status post tracheostomy in place on trach collar Patient has completed multiple courses of antibiotics. Trach management per critical care medicine.  Last seen by pulmonology on 11/6.  They mentioned that he is not a candidate for decannulation.  Recommended continuing routine trach care. On 11/6 patient had low-grade fever.  Leukocytosis was noted from his baseline.  He  was started on Bactrim to cover panel resistant Enterobacter cloacae noted previously. Chest x-ray showed mild patchy opacities. Sputum cultures positive for Staph aureus.  Sensitive to Bactrim. Patient's respiratory status noted to be stable.  He is afebrile.  Continue Bactrim for now.  Plan for 7-day course.   Intracranial Hemorrhage right MCA rupture aneurysmal:  Status post R ICA coil embolization of right ICA terminus aneurysm on 2022-09-11 Remains quite distracted.  He does have left-sided hemiparesis. Mental status appears to be stable for the most part.  Continue to monitor.  Dysphagia Status post PEG tube placed by IR 10/26 continue with tube feedings Speech therapy recommended dysphagia 3 diet 11/1. Now on nocturnal tube feeding.    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   Essential hypertension Blood pressure is reasonably well controlled.  Continue with amlodipine, hydralazine, lisinopril and metoprolol.  Mildly elevated creatinine noted today.  Will increase free water through his feeding tube.  Recheck labs tomorrow.   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 WBC fluctuates, 35--40.  Needs follow-up with hematology.   Diabetes mellitus type 2 CBGs are reasonably well controlled.  Continue SSI.   Thrombocytopenia: Resolved Nonsustained V. tach: Continue with metoprolol. Normal Mg Oral thrush: Diflucan 11/6 for 5 days.   Obesity Class 3  Estimated body mass index is 38.5 kg/m as calculated from the following:   Height as of this encounter: 5\' 7"  (1.702 m).   Weight as of this encounter: 111.5 kg.  DVT Prophylaxis: Subcutaneous heparin Code Status: DNR Family Communication: No family at bedside Disposition Plan: It appears the plan is for him to go  to skilled nursing facility in [place]  Status is: Inpatient Remains inpatient appropriate because: Unsafe  disposition      Medications: Scheduled:  amLODipine  10 mg Per Tube Daily   arformoterol  15 mcg Nebulization BID   famotidine  20 mg Per Tube Daily   feeding supplement (JEVITY 1.5 CAL/FIBER)  1,000 mL Per Tube Q24H   fiber  1 packet Per Tube BID   fluconazole  100 mg Per Tube Daily   free water  300 mL Per Tube Q4H   heparin injection (subcutaneous)  5,000 Units Subcutaneous Q8H   hydrALAZINE  10 mg Per Tube BID   insulin aspart  0-15 Units Subcutaneous Q4H   lisinopril  40 mg Per Tube Daily   metoprolol tartrate  25 mg Per Tube BID   mouth rinse  15 mL Mouth Rinse BID   revefenacin  175 mcg Nebulization Daily   saccharomyces boulardii  250 mg Per Tube BID   sulfamethoxazole-trimethoprim  1 tablet Oral Q12H   Continuous:   [id number] **OR** acetaminophen (TYLENOL) oral liquid 160 mg/5 mL **OR** acetaminophen, albuterol, fentaNYL (SUBLIMAZE) injection, mouth rinse, oxyCODONE  Antibiotics: Anti-infectives (From admission, onward)    Start     Dose/Rate Route Frequency Ordered Stop   10/11/22 1200  sulfamethoxazole-trimethoprim (BACTRIM DS) 800-160 MG per tablet 1 tablet        1 tablet Oral Every 12 hours 10/11/22 1106     10/10/22 1000  fluconazole (DIFLUCAN) 40 MG/ML suspension 100 mg        100 mg Per Tube Daily 10/10/22 0835 10/14/22 2359   10/05/22 1415  fluconazole (DIFLUCAN) 40 MG/ML suspension 100 mg  Status:  Discontinued        100 mg Oral Daily 10/05/22 1319 10/10/22 0835   09/29/22 1445  ceFAZolin (ANCEF) IVPB 2g/100 mL premix  Status:  Discontinued        2 g 200 mL/hr over 30 Minutes Intravenous  Once 09/29/22 1346 10/12/22 1148   09/29/22 1145  ceFAZolin (ANCEF) IVPB 1 g/50 mL premix  Status:  Discontinued        1 g 100 mL/hr over 30 Minutes Intravenous To Radiology 09/29/22 1057 09/29/22 1059   09/29/22 1145  ceFAZolin (ANCEF) IVPB 2g/100 mL premix        2 g 200 mL/hr over 30 Minutes Intravenous  Once 09/29/22 1059 09/29/22 1159   09/14/22  1400  ceFEPIme (MAXIPIME) 2 g in sodium chloride 0.9 % 100 mL IVPB        2 g 200 mL/hr over 30 Minutes Intravenous Every 8 hours 09/14/22 1312 09/21/22 0922   09/10/22 0830  ceFAZolin (ANCEF) IVPB 1 g/50 mL premix  Status:  Discontinued        1 g 100 mL/hr over 30 Minutes Intravenous Every 8 hours 09/10/22 0738 09/10/22 0821   09/10/22 0830  ceFAZolin (ANCEF) IVPB 2g/100 mL premix  Status:  Discontinued        2 g 200 mL/hr over 30 Minutes Intravenous Every 8 hours 09/10/22 0821 09/14/22 1312   09/07/22 0815  ceFAZolin (ANCEF) IVPB 2g/100 mL premix        2 g 200 mL/hr over 30 Minutes Intravenous Every 8 hours 09/07/22 0807 09/08/22 1742   09/04/22 2200  Ampicillin-Sulbactam (UNASYN) 3 g in sodium chloride 0.9 % 100 mL IVPB  Status:  Discontinued        3 g 200 mL/hr over 30 Minutes Intravenous Every 24 hours 09/04/22 0722 09/04/22  1506   09/04/22 2200  ceFAZolin (ANCEF) IVPB 1 g/50 mL premix  Status:  Discontinued        1 g 100 mL/hr over 30 Minutes Intravenous Every 12 hours 09/04/22 1506 09/07/22 0807   09/02/22 1030  Ampicillin-Sulbactam (UNASYN) 3 g in sodium chloride 0.9 % 100 mL IVPB  Status:  Discontinued        3 g 200 mL/hr over 30 Minutes Intravenous Every 6 hours 09/02/22 1009 09/04/22 0722       Objective:  Vital Signs  Vitals:   10/14/22 0400 10/14/22 0500 10/14/22 0731 10/14/22 0814  BP:  128/64 (!) 148/93   Pulse: (!) 103 85 86 70  Resp: (!) 23 16 15    Temp:  98.2 F (36.8 C) (!) 97.3 F (36.3 C)   TempSrc:  Oral Axillary   SpO2: 98% 99% 100%   Weight:  111.5 kg    Height:        Intake/Output Summary (Last 24 hours) at 10/14/2022 0951 Last data filed at 10/14/2022 0606 Gross per 24 hour  Intake 4000 ml  Output 1800 ml  Net 2200 ml    Filed Weights   10/09/22 0536 10/11/22 0500 10/14/22 0500  Weight: 117.5 kg 117.1 kg 111.5 kg    General appearance: Awake alert.  In no distress Tracheostomy noted. Resp: Clear to auscultation bilaterally.   Normal effort Cardio: S1-S2 is normal regular.  No S3-S4.  No rubs murmurs or bruit GI: Abdomen is soft.  Nontender nondistended.  Bowel sounds are present normal.  No masses organomegaly.  PEG tube is noted.    Lab Results:  Data Reviewed: I have personally reviewed following labs and reports of the imaging studies  CBC: Recent Labs  Lab 10/09/22 0255 10/11/22 0347 10/12/22 0534 10/14/22 0322  WBC 41.7* 46.0* 46.2* 53.9*  HGB 13.0 12.9* 13.3 13.0  HCT 39.6 39.3 41.8 41.0  MCV 91.2 90.6 92.1 94.0  PLT 175 209 226 255     Basic Metabolic Panel: Recent Labs  Lab 10/08/22 0805 10/09/22 0255 10/11/22 0347 10/12/22 0534 10/14/22 0322  NA 138 136 138 138 135  K 3.9 4.0 3.7 4.1 4.1  CL 102 101 102 103 102  CO2 27 26 27 25 24   GLUCOSE 112* 106* 128* 128* 116*  BUN 8 12 9 10 13   CREATININE 0.63 0.83 0.89 0.88 1.33*  CALCIUM 9.3 9.4 9.5 9.7 9.4  MG 2.0  --   --   --  2.2     GFR: Estimated Creatinine Clearance: 69.5 mL/min (A) (by C-G formula based on SCr of 1.33 mg/dL (H)).  CBG: Recent Labs  Lab 10/13/22 1610 10/13/22 2019 10/13/22 2322 10/14/22 0338 10/14/22 0730  GLUCAP 112* 118* 129* 106* 116*      Recent Results (from the past 240 hour(s))  Expectorated Sputum Assessment w Gram Stain, Rflx to Resp Cult     Status: None   Collection Time: 10/11/22 10:58 AM   Specimen: Tracheal Aspirate; Sputum  Result Value Ref Range Status   Specimen Description EXPECTORATED SPUTUM  Final   Special Requests NONE  Final   Sputum evaluation   Final    THIS SPECIMEN IS ACCEPTABLE FOR SPUTUM CULTURE Performed at [facility], [place] [address], [place], [place] [address]    Report Status 10/11/2022 FINAL  Final  Culture, Respiratory w Gram Stain     Status: None (Preliminary result)   Collection Time: 10/11/22 10:58 AM  Result Value Ref  Range Status   Specimen Description EXPECTORATED SPUTUM  Final   Special Requests NONE Reflexed from [id number]  Final   Gram  Stain   Final    ABUNDANT WBC PRESENT, PREDOMINANTLY PMN MODERATE GRAM POSITIVE COCCI IN PAIRS IN CLUSTERS MODERATE GRAM POSITIVE RODS RARE GRAM NEGATIVE RODS Performed at [facility], [place] [address], [place], [place] [address]    Culture MODERATE STAPHYLOCOCCUS AUREUS  Final   Report Status PENDING  Incomplete   Organism ID, Bacteria STAPHYLOCOCCUS AUREUS  Final      Susceptibility   Staphylococcus aureus - MIC*    CIPROFLOXACIN <=0.5 SENSITIVE Sensitive     ERYTHROMYCIN >=8 RESISTANT Resistant     GENTAMICIN <=0.5 SENSITIVE Sensitive     OXACILLIN 0.5 SENSITIVE Sensitive     TETRACYCLINE <=1 SENSITIVE Sensitive     VANCOMYCIN 1 SENSITIVE Sensitive     TRIMETH/SULFA <=10 SENSITIVE Sensitive     CLINDAMYCIN RESISTANT Resistant     RIFAMPIN <=0.5 SENSITIVE Sensitive     Inducible Clindamycin POSITIVE Resistant     * MODERATE STAPHYLOCOCCUS AUREUS      Radiology Studies: No results found.     LOS: 53 days   Danali Marinos [employer] on [email or web address]  10/14/2022, 9:51 AM

## 2022-10-14 NOTE — TOC Progression Note (Signed)
Transition of Care [facility]) - Progression Note    Patient Details  Name: SIVAN QUAST MRN: [id number] Date of Birth: 1961-08-07  Transition [facility] CM/SW Contact  Carley Hammed, [place] Phone Number: 10/14/2022, 12:16 PM  Clinical Narrative:     CSW updated by [facility] supervision that paperwork is still being reviewed for approval to [facility] facility. CSW received a call from pt's daughter stating that she and her family do not want pt to go that far away, and want to try for a closer facility. CSW validated dtr's concern, and reviewed the barriers to placement for the pt. CSW advised that pt will be discussed with [facility] supervisor in an effort to assist the pt and family. CSW updated TOC supervisor to families concerns, and they are in agreement to continue to search closer to home for pt. CSW requested assistance from supervisor on placement options, as no other facility has been able to offer due to trach or no payor source. Dtr updated and appreciative of efforts made.  [facility] was consulted again and they stated they do not have an open male bed at this time, but will review again. They are unsure if they can take an LOG. [facility] declined No updates from [facility] facility. [facility] and [facility] are not accepting LOG's. LTC bed at [facility] cannot accommodate [place].  Pt not appropriate for trach/ vent bed placement at higher acuity facilities like [facility].  TOC will continue to follow.         Expected Discharge Plan and Services                                                 Social Determinants of Health (SDOH) Interventions    Readmission Risk Interventions     No data to display

## 2022-10-14 NOTE — Progress Notes (Signed)
Physical Therapy Treatment Patient Details Name: Dustin Jones MRN: [id number] DOB: October 22, 1961 Today's Date: 10/14/2022   History of Present Illness The pt is a 61 y.o. unidentified homeless male presenting 9/28 after being found down by bystanders. 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 S/P coil embolization of R ICA aneurysm 9/28. Intubated 9/29. Trach placement 10/10.  Back to ICU 10/15 for respiratory failure, had IVC filter placed 10/26 and PEG.  No PMH on file.    PT Comments    Pt with continued progress with good motivation for participation. Pt able to come to sitting EOB with min assist to elevate trunk. Pt able to come to stand in stedy frame x3 for extended time, needing min-mod assist intermittently to maintain midline and balance. Pt with urgency for BM and needing min assist to return to supine. Pt continues to benefit from skilled PT services to progress toward functional mobility goals.    Recommendations for follow up therapy are one component of a multi-disciplinary discharge planning process, led by the attending physician.  Recommendations may be updated based on patient status, additional functional criteria and insurance authorization.  Follow Up Recommendations  Skilled nursing-short term rehab (<3 hours/day) Can patient physically be transported by private vehicle: No   Assistance Recommended at Discharge Frequent or constant Supervision/Assistance  Patient can return home with the following Two people to help with walking and/or transfers;Two people to help with bathing/dressing/bathroom;Assistance with cooking/housework;Assistance with feeding;Direct supervision/assist for medications management;Direct supervision/assist for financial management;Help with stairs or ramp for entrance;Assist for transportation   Equipment Recommendations  Other  (comment) (TBD)    Recommendations for Other Services       Precautions / Restrictions Precautions Precautions: Fall Precaution Comments: trach, PEG Restrictions Weight Bearing Restrictions: No     Mobility  Bed Mobility Overal bed mobility: Needs Assistance Bed Mobility: Rolling, Sidelying to Sit, Sit to Sidelying Rolling: Min assist (toward L) Sidelying to sit: Min assist     Sit to sidelying: Min assist General bed mobility comments: Min A for elevation of trunk OOB and mod A for bring BLE into bed following session    Transfers Overall transfer level: Needs assistance Equipment used: Ambulation equipment used Transfers: Sit to/from Stand Sit to Stand: Mod assist, +2 physical assistance, +2 safety/equipment, Min assist           General transfer comment: Mod A to power up to standing. Mod A +2 to maintain standing balance. Pt performing 3 STS transfers to stedy, mirror in front of pt for finding midline, pt with intermittendt pushing to L with ability to correct with multimodal cues    Ambulation/Gait               General Gait Details: pt requesting to use bedpan.   Stairs             Wheelchair Mobility    Modified Rankin (Stroke Patients Only) Modified Rankin (Stroke Patients Only) Pre-Morbid Rankin Score: No symptoms Modified Rankin: Severe disability     Balance Overall balance assessment: Needs assistance Sitting-balance support: Feet supported Sitting balance-Leahy Scale: Fair Sitting balance - Comments: sditting EOB with feet supported and intermittent UE support   Standing balance support: Bilateral upper extremity supported Standing balance-Leahy Scale: Poor Standing balance comment: Pushing R and needing mod A to mainatin midline  Cognition Arousal/Alertness: Awake/alert Behavior During Therapy: Flat affect Overall Cognitive Status: Impaired/Different from baseline Area of Impairment:  Attention, Following commands, Problem solving, Safety/judgement                   Current Attention Level: Sustained   Following Commands: Follows one step commands with increased time, Follows one step commands inconsistently Safety/Judgement: Decreased awareness of deficits Awareness:  (Decreased awareness of R side of environment.) Problem Solving: Slow processing, Decreased initiation, Requires verbal cues General Comments: Pt with verbalizations during session,  Pt following all one step commands with increased time and inconsistently following 2 step commands. Very motivated to participate in therapy.        Exercises Other Exercises Other Exercises: STSx4 with standing ~45 seconds each attempt. Other Exercises: Weight shift toward R in standing with cues to bring shoulders to midline Other Exercises: Reaching with RUE to mirror to make marks Other Exercises: marching LLE    General Comments        Pertinent Vitals/Pain Pain Assessment Pain Assessment: No/denies pain Pain Intervention(s): Monitored during session    Home Living                          Prior Function            PT Goals (current goals can now be found in the care plan section) Acute Rehab PT Goals PT Goal Formulation: Patient unable to participate in goal setting Time For Goal Achievement: 10/20/22    Frequency    Min 2X/week      PT Plan Current plan remains appropriate    Co-evaluation              AM-PAC PT "6 Clicks" Mobility   Outcome Measure  Help needed turning from your back to your side while in a flat bed without using bedrails?: Total Help needed moving from lying on your back to sitting on the side of a flat bed without using bedrails?: Total Help needed moving to and from a bed to a chair (including a wheelchair)?: A Lot Help needed standing up from a chair using your arms (e.g., wheelchair or bedside chair)?: A Lot Help needed to walk in hospital  room?: Total Help needed climbing 3-5 steps with a railing? : Total 6 Click Score: 8    End of Session Equipment Utilized During Treatment: Gait belt Activity Tolerance: Patient tolerated treatment well Patient left: with call bell/phone within reach;in bed (on bedpan) Nurse Communication: Mobility status PT Visit Diagnosis: Other abnormalities of gait and mobility (R26.89);Hemiplegia and hemiparesis;Other symptoms and signs involving the nervous system (R29.898);Muscle weakness (generalized) (M62.81) Hemiplegia - Right/Left: Left Hemiplegia - dominant/non-dominant: Non-dominant Hemiplegia - caused by: Nontraumatic intracerebral hemorrhage     Time: 5366-4403 PT Time Calculation (min) (ACUTE ONLY): 28 min  Charges:  $Therapeutic Activity: 23-37 mins                    Remedy Corporan R. PTA Acute Rehabilitation Services Office: [phone]    Catalina Antigua 10/14/2022, 4:39 PM

## 2022-10-15 DIAGNOSIS — N179 Acute kidney failure, unspecified: Secondary | ICD-10-CM

## 2022-10-15 LAB — BASIC METABOLIC PANEL
Anion gap: 9 (ref 5–15)
BUN: 15 mg/dL (ref 8–23)
CO2: 23 mmol/L (ref 22–32)
Calcium: 9.6 mg/dL (ref 8.9–10.3)
Chloride: 105 mmol/L (ref 98–111)
Creatinine, Ser: 1.78 mg/dL — ABNORMAL HIGH (ref 0.61–1.24)
GFR, Estimated: 43 mL/min — ABNORMAL LOW (ref >60)
Glucose, Bld: 115 mg/dL — ABNORMAL HIGH (ref 70–99)
Potassium: 4.1 mmol/L (ref 3.5–5.1)
Sodium: 137 mmol/L (ref 135–145)

## 2022-10-15 LAB — CBC
HCT: 38.6 % — ABNORMAL LOW (ref 39.0–52.0)
Hemoglobin: 12.5 g/dL — ABNORMAL LOW (ref 13.0–17.0)
MCH: 29.3 pg (ref 26.0–34.0)
MCHC: 32.4 g/dL (ref 30.0–36.0)
MCV: 90.6 fL (ref 80.0–100.0)
Platelets: 245 10*3/uL (ref 150–400)
RBC: 4.26 MIL/uL (ref 4.22–5.81)
RDW: 13 % (ref 11.5–15.5)
WBC: 61.9 10*3/uL (ref 4.0–10.5)
nRBC: 0 % (ref 0.0–0.2)

## 2022-10-15 LAB — GLUCOSE, CAPILLARY
Glucose-Capillary: 107 mg/dL — ABNORMAL HIGH (ref 70–99)
Glucose-Capillary: 109 mg/dL — ABNORMAL HIGH (ref 70–99)
Glucose-Capillary: 118 mg/dL — ABNORMAL HIGH (ref 70–99)
Glucose-Capillary: 124 mg/dL — ABNORMAL HIGH (ref 70–99)
Glucose-Capillary: 126 mg/dL — ABNORMAL HIGH (ref 70–99)
Glucose-Capillary: 135 mg/dL — ABNORMAL HIGH (ref 70–99)
Glucose-Capillary: 138 mg/dL — ABNORMAL HIGH (ref 70–99)

## 2022-10-15 MED ORDER — SODIUM CHLORIDE 0.45 % IV SOLN: INTRAVENOUS | Status: AC

## 2022-10-15 MED ORDER — SODIUM CHLORIDE 0.9 % IV SOLN
1.0000 g | Freq: Two times a day (BID) | INTRAVENOUS | Status: DC
  Administered 2022-10-15 (×2): 1 g via INTRAVENOUS
  Filled 2022-10-15 (×3): qty 20

## 2022-10-15 MED ORDER — SODIUM CHLORIDE 0.45 % IV BOLUS
500.0000 mL | Freq: Once | INTRAVENOUS | Status: AC
  Administered 2022-10-15: 500 mL via INTRAVENOUS

## 2022-10-15 NOTE — Plan of Care (Signed)
°  Problem: Clinical Measurements: °Goal: Respiratory complications will improve °Outcome: Progressing °Goal: Cardiovascular complication will be avoided °Outcome: Progressing °  °Problem: Activity: °Goal: Risk for activity intolerance will decrease °Outcome: Progressing °  °

## 2022-10-15 NOTE — Progress Notes (Signed)
[facility] PROGRESS NOTE   Dustin Jones LU:[id number] DOB: 13-Jul-1961 DOA: 09/02/2022  PCP: Marliss Coots, NP  Brief History/Interval Summary: 61 year old with past medical history significant for hypertension, history of DVT admitted 9/28 as a code stroke, admitted to ICU with working diagnosis of intracranial hemorrhage right MCA ruptured aneurysm, has had prolonged hospitalization including multiple procedures, work-up including right ICA coil embolization of right ICA terminus aneurysm on 08/13/2022, tracheostomy by CCM on 10/10 and transferred to [facility] on 10/22.   9/28 admitted intracranial hemorrhage right MCA ruptured aneurysm status post CT head. 9/29: MRI brain large acute intraparenchymal hemorrhage extension into the right greater than left lateral ventricle, third ventricle and fourth ventricle. 9/29 developed fever and pneumonia, MSSA and respiratory culture white blood cell up to 56.  Treated. 10/15 transferred to the hospitalist service, transferred back to ICU for worsening respiratory status.  Was placed back on vent. 10/26; IVC filter and PEG tube placed by IR. 10/28: After conference call with family CODE STATUS was changed to DNR/DNI do not placed back on vent support. 11/01: Speech recommend Dysphagia 3 diet.  11/07 started Bactrim to cover for PNA.     Subjective/Interval History: Patient denies any complaints.     Assessment/Plan:  Acute Hypoxic Respiratory Failure secondary to Pneumonia Pneumonia due to MSSA and Enterobacter Status post tracheostomy in place on trach collar Patient has completed multiple courses of antibiotics. Trach management per critical care medicine.  Last seen by pulmonology on 11/6.  They mentioned that he is not a candidate for decannulation.  Recommended continuing routine trach care. On 11/6 patient had low-grade fever.  Leukocytosis was noted from his baseline.  He was started on Bactrim to cover panel resistant  Enterobacter cloacae noted previously. Chest x-ray showed mild patchy opacities. Sputum cultures positive for Staph aureus.  Sensitive to Bactrim. Patient's respiratory status is stable.  Due to rising creatinine Bactrim has been changed over to meropenem today.  Plan for 7-day course in total.  Acute kidney injury Creatinine noted to be climbing over the last 48 hours.  Creatinine is up to 1.78.  BUN is 15.  ACE inhibitor will be held.  Will be given IV fluid bolus and maintenance IV fluids.  Monitor urine output.  Bladder scans.   Intracranial Hemorrhage right MCA rupture aneurysmal:  Status post R ICA coil embolization of right ICA terminus aneurysm on 08/11/2022 Remains quite distracted.  He does have left-sided hemiparesis. Mental status appears to be stable for the most part.  Continue to monitor.  Dysphagia Status post PEG tube placed by IR 10/26 continue with tube feedings Speech therapy recommended dysphagia 3 diet 11/1. Now on nocturnal tube feeding.    Bilateral DVTs Not a candidate for anticoagulation due to [place], status post IVC filter in 09/29/2022 by IR.   Essential hypertension Blood pressure is reasonably well controlled.  Continue with amlodipine, hydralazine, and metoprolol.  Lisinopril held due to elevated creatinine.   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 For past several days it has been between 35-40.  Over the last 48 hours it has significantly improved.  Previous smear examination have been unremarkable in terms of WBC morphology. WBC noted to be 61.9 today.  Could be due to a degree of hypovolemia.  No fever noted.  Recheck labs tomorrow.  He will be getting IV fluids over the next 24 hours. He will eventually need to be seen by hematology in the  outpatient setting.   Diabetes mellitus type 2 CBGs are reasonably well controlled.  Continue SSI.   Thrombocytopenia: Resolved Nonsustained V. tach: Continue with metoprolol.   Oral thrush: Diflucan 11/6 for 5 days.   Obesity Class 3  Estimated body mass index is 38.33 kg/m as calculated from the following:   Height as of this encounter: 5\' 7"  (1.702 m).   Weight as of this encounter: 111 kg.  DVT Prophylaxis: Subcutaneous heparin Code Status: DNR Family Communication: No family at bedside Disposition Plan: It appears the plan is for him to go to skilled nursing facility in [place]  Status is: Inpatient Remains inpatient appropriate because: Unsafe disposition      Medications: Scheduled:  amLODipine  10 mg Per Tube Daily   arformoterol  15 mcg Nebulization BID   famotidine  20 mg Per Tube Daily   feeding supplement (JEVITY 1.5 CAL/FIBER)  1,000 mL Per Tube Q24H   fiber  1 packet Per Tube BID   free water  300 mL Per Tube Q4H   heparin injection (subcutaneous)  5,000 Units Subcutaneous Q8H   hydrALAZINE  10 mg Per Tube BID   insulin aspart  0-15 Units Subcutaneous Q4H   metoprolol tartrate  25 mg Per Tube BID   mouth rinse  15 mL Mouth Rinse BID   revefenacin  175 mcg Nebulization Daily   saccharomyces boulardii  250 mg Per Tube BID   Continuous:  sodium chloride     meropenem (MERREM) IV     sodium chloride      [id number] **OR** acetaminophen (TYLENOL) oral liquid 160 mg/5 mL **OR** acetaminophen, albuterol, fentaNYL (SUBLIMAZE) injection, mouth rinse, oxyCODONE  Antibiotics: Anti-infectives (From admission, onward)    Start     Dose/Rate Route Frequency Ordered Stop   10/15/22 1000  meropenem (MERREM) 1 g in sodium chloride 0.9 % 100 mL IVPB        1 g 200 mL/hr over 30 Minutes Intravenous Every 12 hours 10/15/22 0842 10/17/22 2359   10/11/22 1200  sulfamethoxazole-trimethoprim (BACTRIM DS) 800-160 MG per tablet 1 tablet  Status:  Discontinued        1 tablet Oral Every 12 hours 10/11/22 1106 10/15/22 0842   10/10/22 1000  fluconazole (DIFLUCAN) 40 MG/ML suspension 100 mg        100 mg Per Tube Daily 10/10/22 0835 10/14/22  1003   10/05/22 1415  fluconazole (DIFLUCAN) 40 MG/ML suspension 100 mg  Status:  Discontinued        100 mg Oral Daily 10/05/22 1319 10/10/22 0835   09/29/22 1445  ceFAZolin (ANCEF) IVPB 2g/100 mL premix  Status:  Discontinued        2 g 200 mL/hr over 30 Minutes Intravenous  Once 09/29/22 1346 10/12/22 1148   09/29/22 1145  ceFAZolin (ANCEF) IVPB 1 g/50 mL premix  Status:  Discontinued        1 g 100 mL/hr over 30 Minutes Intravenous To Radiology 09/29/22 1057 09/29/22 1059   09/29/22 1145  ceFAZolin (ANCEF) IVPB 2g/100 mL premix        2 g 200 mL/hr over 30 Minutes Intravenous  Once 09/29/22 1059 09/29/22 1159   09/14/22 1400  ceFEPIme (MAXIPIME) 2 g in sodium chloride 0.9 % 100 mL IVPB        2 g 200 mL/hr over 30 Minutes Intravenous Every 8 hours 09/14/22 1312 09/21/22 0922   09/10/22 0830  ceFAZolin (ANCEF) IVPB 1 g/50 mL premix  Status:  Discontinued  1 g 100 mL/hr over 30 Minutes Intravenous Every 8 hours 09/10/22 0738 09/10/22 0821   09/10/22 0830  ceFAZolin (ANCEF) IVPB 2g/100 mL premix  Status:  Discontinued        2 g 200 mL/hr over 30 Minutes Intravenous Every 8 hours 09/10/22 0821 09/14/22 1312   09/07/22 0815  ceFAZolin (ANCEF) IVPB 2g/100 mL premix        2 g 200 mL/hr over 30 Minutes Intravenous Every 8 hours 09/07/22 0807 09/08/22 1742   09/04/22 2200  Ampicillin-Sulbactam (UNASYN) 3 g in sodium chloride 0.9 % 100 mL IVPB  Status:  Discontinued        3 g 200 mL/hr over 30 Minutes Intravenous Every 24 hours 09/04/22 0722 09/04/22 1506   09/04/22 2200  ceFAZolin (ANCEF) IVPB 1 g/50 mL premix  Status:  Discontinued        1 g 100 mL/hr over 30 Minutes Intravenous Every 12 hours 09/04/22 1506 09/07/22 0807   09/02/22 1030  Ampicillin-Sulbactam (UNASYN) 3 g in sodium chloride 0.9 % 100 mL IVPB  Status:  Discontinued        3 g 200 mL/hr over 30 Minutes Intravenous Every 6 hours 09/02/22 1009 09/04/22 0722       Objective:  Vital Signs  Vitals:   10/15/22  0500 10/15/22 0534 10/15/22 0757 10/15/22 0822  BP:   114/76   Pulse: (!) 107 96 92 93  Resp: (!) 22  18 20   Temp:   99.7 F (37.6 C)   TempSrc:   Oral   SpO2: 99%   99%  Weight: 111 kg     Height:        Intake/Output Summary (Last 24 hours) at 10/15/2022 0925 Last data filed at 10/15/2022 0700 Gross per 24 hour  Intake 240 ml  Output --  Net 240 ml    Filed Weights   10/11/22 0500 10/14/22 0500 10/15/22 0500  Weight: 117.1 kg 111.5 kg 111 kg    General appearance: Awake alert.  In no distress Tracheostomy noted Resp: Clear to auscultation bilaterally.  Normal effort Cardio: S1-S2 is normal regular.  No S3-S4.  No rubs murmurs or bruit GI: Abdomen is soft.  Nontender nondistended.  Bowel sounds are present normal.  No masses organomegaly.  PEG tube is noted Extremities: No edema.  Moving his extremities    Lab Results:  Data Reviewed: I have personally reviewed following labs and reports of the imaging studies  CBC: Recent Labs  Lab 10/09/22 0255 10/11/22 0347 10/12/22 0534 10/14/22 0322 10/15/22 0235  WBC 41.7* 46.0* 46.2* 53.9* 61.9*  HGB 13.0 12.9* 13.3 13.0 12.5*  HCT 39.6 39.3 41.8 41.0 38.6*  MCV 91.2 90.6 92.1 94.0 90.6  PLT 175 209 226 255 245     Basic Metabolic Panel: Recent Labs  Lab 10/09/22 0255 10/11/22 0347 10/12/22 0534 10/14/22 0322 10/15/22 0235  NA 136 138 138 135 137  K 4.0 3.7 4.1 4.1 4.1  CL 101 102 103 102 105  CO2 26 27 25 24 23   GLUCOSE 106* 128* 128* 116* 115*  BUN 12 9 10 13 15   CREATININE 0.83 0.89 0.88 1.33* 1.78*  CALCIUM 9.4 9.5 9.7 9.4 9.6  MG  --   --   --  2.2  --      GFR: Estimated Creatinine Clearance: 51.8 mL/min (A) (by C-G formula based on SCr of 1.78 mg/dL (H)).  CBG: Recent Labs  Lab 10/14/22 1627 10/14/22 2021 10/15/22 0014 10/15/22  1749 10/15/22 0752  GLUCAP 125* 99 126* 124* 138*      Recent Results (from the past 240 hour(s))  Expectorated Sputum Assessment w Gram Stain, Rflx to  Resp Cult     Status: None   Collection Time: 10/11/22 10:58 AM   Specimen: Tracheal Aspirate; Sputum  Result Value Ref Range Status   Specimen Description EXPECTORATED SPUTUM  Final   Special Requests NONE  Final   Sputum evaluation   Final    THIS SPECIMEN IS ACCEPTABLE FOR SPUTUM CULTURE Performed at [facility], [address] [place], [place] [address]    Report Status 10/11/2022 FINAL  Final  Culture, Respiratory w Gram Stain     Status: None   Collection Time: 10/11/22 10:58 AM  Result Value Ref Range Status   Specimen Description EXPECTORATED SPUTUM  Final   Special Requests NONE Reflexed from [id number]  Final   Gram Stain   Final    ABUNDANT WBC PRESENT, PREDOMINANTLY PMN MODERATE GRAM POSITIVE COCCI IN PAIRS IN CLUSTERS MODERATE GRAM POSITIVE RODS RARE GRAM NEGATIVE RODS    Culture   Final    MODERATE STAPHYLOCOCCUS AUREUS No Pseudomonas species isolated Performed at [facility], [address], [place], [place] [address]    Report Status 10/14/2022 FINAL  Final   Organism ID, Bacteria STAPHYLOCOCCUS AUREUS  Final      Susceptibility   Staphylococcus aureus - MIC*    CIPROFLOXACIN <=0.5 SENSITIVE Sensitive     ERYTHROMYCIN >=8 RESISTANT Resistant     GENTAMICIN <=0.5 SENSITIVE Sensitive     OXACILLIN 0.5 SENSITIVE Sensitive     TETRACYCLINE <=1 SENSITIVE Sensitive     VANCOMYCIN 1 SENSITIVE Sensitive     TRIMETH/SULFA <=10 SENSITIVE Sensitive     CLINDAMYCIN RESISTANT Resistant     RIFAMPIN <=0.5 SENSITIVE Sensitive     Inducible Clindamycin POSITIVE Resistant     * MODERATE STAPHYLOCOCCUS AUREUS      Radiology Studies: No results found.     LOS: 44 days   Mackenzye Mackel [employer] on [email or web address]  10/15/2022, 9:25 AM

## 2022-10-15 NOTE — Progress Notes (Signed)
Mobility Specialist: Progress Note   10/15/22 1031  Mobility  Activity Ambulated with assistance in room  Level of Assistance Moderate assist, patient does 50-74%  Assistive Device Four wheel walker (EVA)  Distance Ambulated (ft) 20 ft  Activity Response Tolerated well  Mobility Referral Yes  $Mobility charge 1 Mobility   Pt received in the bed and agreeable to mobility. Mod I with bed mobility and minA to stand. Required +2 physical assistance to maintain LUE on EVA walker and to widen BOS with MS foot placement. Cues for upright posture and proximity. Pt to the bed per request to use bed pan after session. NT present in the room.   Cristen Murcia Mobility Specialist Please contact via SecureChat or Rehab office at [phone]

## 2022-10-16 LAB — GLUCOSE, CAPILLARY
Glucose-Capillary: 100 mg/dL — ABNORMAL HIGH (ref 70–99)
Glucose-Capillary: 109 mg/dL — ABNORMAL HIGH (ref 70–99)
Glucose-Capillary: 112 mg/dL — ABNORMAL HIGH (ref 70–99)
Glucose-Capillary: 112 mg/dL — ABNORMAL HIGH (ref 70–99)
Glucose-Capillary: 125 mg/dL — ABNORMAL HIGH (ref 70–99)
Glucose-Capillary: 99 mg/dL (ref 70–99)

## 2022-10-16 LAB — BASIC METABOLIC PANEL
Anion gap: 11 (ref 5–15)
BUN: 12 mg/dL (ref 8–23)
CO2: 20 mmol/L — ABNORMAL LOW (ref 22–32)
Calcium: 9.3 mg/dL (ref 8.9–10.3)
Chloride: 107 mmol/L (ref 98–111)
Creatinine, Ser: 1.03 mg/dL (ref 0.61–1.24)
GFR, Estimated: >60 mL/min (ref >60)
Glucose, Bld: 130 mg/dL — ABNORMAL HIGH (ref 70–99)
Potassium: 4.8 mmol/L (ref 3.5–5.1)
Sodium: 138 mmol/L (ref 135–145)

## 2022-10-16 LAB — CBC WITH DIFFERENTIAL/PLATELET
Abs Immature Granulocytes: 0 10*3/uL (ref 0.00–0.07)
Basophils Absolute: 0.6 10*3/uL — ABNORMAL HIGH (ref 0.0–0.1)
Basophils Relative: 1 %
Eosinophils Absolute: 1.8 10*3/uL — ABNORMAL HIGH (ref 0.0–0.5)
Eosinophils Relative: 3 %
HCT: 39.1 % (ref 39.0–52.0)
Hemoglobin: 12.8 g/dL — ABNORMAL LOW (ref 13.0–17.0)
Lymphocytes Relative: 81 %
Lymphs Abs: 49.9 10*3/uL — ABNORMAL HIGH (ref 0.7–4.0)
MCH: 30 pg (ref 26.0–34.0)
MCHC: 32.7 g/dL (ref 30.0–36.0)
MCV: 91.6 fL (ref 80.0–100.0)
Monocytes Absolute: 1.8 10*3/uL — ABNORMAL HIGH (ref 0.1–1.0)
Monocytes Relative: 3 %
Neutro Abs: 7.4 10*3/uL (ref 1.7–7.7)
Neutrophils Relative %: 12 %
Platelets: 263 10*3/uL (ref 150–400)
RBC: 4.27 MIL/uL (ref 4.22–5.81)
RDW: 12.9 % (ref 11.5–15.5)
WBC: 61.6 10*3/uL (ref 4.0–10.5)
nRBC: 0 % (ref 0.0–0.2)
nRBC: 0 /100 WBC

## 2022-10-16 MED ORDER — SODIUM CHLORIDE 0.45 % IV SOLN: INTRAVENOUS | Status: AC

## 2022-10-16 MED ORDER — SODIUM CHLORIDE 0.9 % IV SOLN
1.0000 g | Freq: Three times a day (TID) | INTRAVENOUS | Status: AC
  Administered 2022-10-16 – 2022-10-17 (×5): 1 g via INTRAVENOUS
  Filled 2022-10-16 (×5): qty 20

## 2022-10-16 NOTE — Progress Notes (Signed)
[facility] PROGRESS NOTE   Laiden Cyndi Lennert LU:[id number] DOB: 03/10/1961 DOA: 08/24/2022  PCP: Marliss Coots, NP  Brief History/Interval Summary: 60 year old with past medical history significant for hypertension, history of DVT admitted 9/28 as a code stroke, admitted to ICU with working diagnosis of intracranial hemorrhage right MCA ruptured aneurysm, has had prolonged hospitalization including multiple procedures, work-up including right ICA coil embolization of right ICA terminus aneurysm on 08/21/2022, tracheostomy by CCM on 10/10 and transferred to [facility] on 10/22.   9/28 admitted intracranial hemorrhage right MCA ruptured aneurysm status post CT head. 9/29: MRI brain large acute intraparenchymal hemorrhage extension into the right greater than left lateral ventricle, third ventricle and fourth ventricle. 9/29 developed fever and pneumonia, MSSA and respiratory culture white blood cell up to 56.  Treated. 10/15 transferred to the hospitalist service, transferred back to ICU for worsening respiratory status.  Was placed back on vent. 10/26; IVC filter and PEG tube placed by IR. 10/28: After conference call with family CODE STATUS was changed to DNR/DNI do not placed back on vent support. 11/01: Speech recommend Dysphagia 3 diet.  11/07 started Bactrim to cover for PNA.     Subjective/Interval History: No complaints offered.   Assessment/Plan:  Acute Hypoxic Respiratory Failure secondary to Pneumonia Pneumonia due to MSSA and Enterobacter Status post tracheostomy in place on trach collar Patient has completed multiple courses of antibiotics. Trach management per critical care medicine.  Last seen by pulmonology on 11/6.  They mentioned that he is not a candidate for decannulation.  Recommended continuing routine trach care. On 11/6 patient had low-grade fever.  Leukocytosis was noted from his baseline.  He was started on Bactrim to cover panel resistant Enterobacter  cloacae noted previously. Chest x-ray showed mild patchy opacities. Sputum cultures positive for Staph aureus.  Sensitive to Bactrim. Patient's respiratory status is stable.  Due to rising creatinine Bactrim was changed over to meropenem on 11/11.  Plan for 7-day course in total.  Acute kidney injury Creatinine had climbed significantly over 48 hours and noted to be 1.70 8.11/11.  ACE inhibitor was held.  Bactrim was changed over to meropenem.  Patient was given IV fluids.  Creatinine has improved to 1.03.  His urine output has picked up.  Continue to hold ACE inhibitor.     Intracranial Hemorrhage right MCA rupture aneurysmal:  Status post R ICA coil embolization of right ICA terminus aneurysm on 08/17/2022 Remains quite distracted.  He does have left-sided hemiparesis. Neurologic status is stable.  Dysphagia Status post PEG tube placed by IR 10/26 continue with tube feedings Speech therapy recommended dysphagia 3 diet 11/1. Now on nocturnal tube feeding.    Bilateral DVTs Not a candidate for anticoagulation due to [place], status post IVC filter in 09/29/2022 by IR.   Essential hypertension Blood pressure is reasonably well controlled.  Continue with amlodipine, hydralazine, and metoprolol.  Lisinopril held due to elevated creatinine.   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 For past several days it has been between 35-40.  Over the last 48 hours it has significantly improved.  Previous smear examination have been unremarkable in terms of WBC morphology. WBC remains elevated.  Continue to monitor periodically. He will need to be seen by hematology eventually.   Diabetes mellitus type 2 CBGs are reasonably well controlled.  Continue SSI.   Thrombocytopenia: Resolved Nonsustained V. tach: Continue with metoprolol.  Oral thrush: Diflucan 11/6 for 5 days.  Obesity Class 3  Estimated body mass index is 38.33 kg/m as calculated from the following:    Height as of this encounter: 5\' 7"  (1.702 m).   Weight as of this encounter: 111 kg.  DVT Prophylaxis: Subcutaneous heparin Code Status: DNR Family Communication: No family at bedside Disposition Plan: It appears the plan is for him to go to skilled nursing facility in [place]  Status is: Inpatient Remains inpatient appropriate because: Unsafe disposition      Medications: Scheduled:  amLODipine  10 mg Per Tube Daily   arformoterol  15 mcg Nebulization BID   famotidine  20 mg Per Tube Daily   feeding supplement (JEVITY 1.5 CAL/FIBER)  1,000 mL Per Tube Q24H   fiber  1 packet Per Tube BID   free water  300 mL Per Tube Q4H   heparin injection (subcutaneous)  5,000 Units Subcutaneous Q8H   hydrALAZINE  10 mg Per Tube BID   insulin aspart  0-15 Units Subcutaneous Q4H   metoprolol tartrate  25 mg Per Tube BID   mouth rinse  15 mL Mouth Rinse BID   revefenacin  175 mcg Nebulization Daily   saccharomyces boulardii  250 mg Per Tube BID   Continuous:  meropenem (MERREM) IV 1 g (10/16/22 0850)    [id number] **OR** acetaminophen (TYLENOL) oral liquid 160 mg/5 mL **OR** acetaminophen, albuterol, fentaNYL (SUBLIMAZE) injection, mouth rinse, oxyCODONE  Antibiotics: Anti-infectives (From admission, onward)    Start     Dose/Rate Route Frequency Ordered Stop   10/16/22 0900  meropenem (MERREM) 1 g in sodium chloride 0.9 % 100 mL IVPB        1 g 200 mL/hr over 30 Minutes Intravenous Every 8 hours 10/16/22 0803 10/17/22 2159   10/15/22 1000  meropenem (MERREM) 1 g in sodium chloride 0.9 % 100 mL IVPB  Status:  Discontinued        1 g 200 mL/hr over 30 Minutes Intravenous Every 12 hours 10/15/22 0842 10/16/22 0803   10/11/22 1200  sulfamethoxazole-trimethoprim (BACTRIM DS) 800-160 MG per tablet 1 tablet  Status:  Discontinued        1 tablet Oral Every 12 hours 10/11/22 1106 10/15/22 0842   10/10/22 1000  fluconazole (DIFLUCAN) 40 MG/ML suspension 100 mg        100 mg Per Tube  Daily 10/10/22 0835 10/14/22 1003   10/05/22 1415  fluconazole (DIFLUCAN) 40 MG/ML suspension 100 mg  Status:  Discontinued        100 mg Oral Daily 10/05/22 1319 10/10/22 0835   09/29/22 1445  ceFAZolin (ANCEF) IVPB 2g/100 mL premix  Status:  Discontinued        2 g 200 mL/hr over 30 Minutes Intravenous  Once 09/29/22 1346 10/12/22 1148   09/29/22 1145  ceFAZolin (ANCEF) IVPB 1 g/50 mL premix  Status:  Discontinued        1 g 100 mL/hr over 30 Minutes Intravenous To Radiology 09/29/22 1057 09/29/22 1059   09/29/22 1145  ceFAZolin (ANCEF) IVPB 2g/100 mL premix        2 g 200 mL/hr over 30 Minutes Intravenous  Once 09/29/22 1059 09/29/22 1159   09/14/22 1400  ceFEPIme (MAXIPIME) 2 g in sodium chloride 0.9 % 100 mL IVPB        2 g 200 mL/hr over 30 Minutes Intravenous Every 8 hours 09/14/22 1312 09/21/22 0922   09/10/22 0830  ceFAZolin (ANCEF) IVPB 1 g/50 mL premix  Status:  Discontinued  1 g 100 mL/hr over 30 Minutes Intravenous Every 8 hours 09/10/22 0738 09/10/22 0821   09/10/22 0830  ceFAZolin (ANCEF) IVPB 2g/100 mL premix  Status:  Discontinued        2 g 200 mL/hr over 30 Minutes Intravenous Every 8 hours 09/10/22 0821 09/14/22 1312   09/07/22 0815  ceFAZolin (ANCEF) IVPB 2g/100 mL premix        2 g 200 mL/hr over 30 Minutes Intravenous Every 8 hours 09/07/22 0807 09/08/22 1742   09/04/22 2200  Ampicillin-Sulbactam (UNASYN) 3 g in sodium chloride 0.9 % 100 mL IVPB  Status:  Discontinued        3 g 200 mL/hr over 30 Minutes Intravenous Every 24 hours 09/04/22 0722 09/04/22 1506   09/04/22 2200  ceFAZolin (ANCEF) IVPB 1 g/50 mL premix  Status:  Discontinued        1 g 100 mL/hr over 30 Minutes Intravenous Every 12 hours 09/04/22 1506 09/07/22 0807   09/02/22 1030  Ampicillin-Sulbactam (UNASYN) 3 g in sodium chloride 0.9 % 100 mL IVPB  Status:  Discontinued        3 g 200 mL/hr over 30 Minutes Intravenous Every 6 hours 09/02/22 1009 09/04/22 0722       Objective:  Vital  Signs  Vitals:   10/16/22 0114 10/16/22 0338 10/16/22 0350 10/16/22 0738  BP:  (!) 144/96  (!) 149/87  Pulse:  70  70  Resp: 19 18  16   Temp:  98.6 F (37 C)  98.9 F (37.2 C)  TempSrc:  Oral  Oral  SpO2: 97% 92% 92% 95%  Weight:      Height:        Intake/Output Summary (Last 24 hours) at 10/16/2022 0927 Last data filed at 10/16/2022 0603 Gross per 24 hour  Intake 1263.33 ml  Output 2850 ml  Net -1586.67 ml    Filed Weights   10/11/22 0500 10/14/22 0500 10/15/22 0500  Weight: 117.1 kg 111.5 kg 111 kg    General appearance: Awake alert.  In no distress Tracheostomy noted Resp: Clear to auscultation bilaterally.  Normal effort Cardio: S1-S2 is normal regular.  No S3-S4.  No rubs murmurs or bruit GI: Abdomen is soft.  Nontender nondistended.  Bowel sounds are present normal.  No masses organomegaly Extremities: No edema.     Lab Results:  Data Reviewed: I have personally reviewed following labs and reports of the imaging studies  CBC: Recent Labs  Lab 10/11/22 0347 10/12/22 0534 10/14/22 0322 10/15/22 0235 10/16/22 0658  WBC 46.0* 46.2* 53.9* 61.9* 61.6*  NEUTROABS  --   --   --   --  7.4  HGB 12.9* 13.3 13.0 12.5* 12.8*  HCT 39.3 41.8 41.0 38.6* 39.1  MCV 90.6 92.1 94.0 90.6 91.6  PLT 209 226 255 245 263     Basic Metabolic Panel: Recent Labs  Lab 10/11/22 0347 10/12/22 0534 10/14/22 0322 10/15/22 0235 10/16/22 0658  NA 138 138 135 137 138  K 3.7 4.1 4.1 4.1 4.8  CL 102 103 102 105 107  CO2 27 25 24 23  20*  GLUCOSE 128* 128* 116* 115* 130*  BUN 9 10 13 15 12   CREATININE 0.89 0.88 1.33* 1.78* 1.03  CALCIUM 9.5 9.7 9.4 9.6 9.3  MG  --   --  2.2  --   --      GFR: Estimated Creatinine Clearance: 89.6 mL/min (by C-G formula based on SCr of 1.03 mg/dL).  CBG: Recent Labs  Lab 10/15/22 1620 10/15/22 1945 10/15/22 2334 10/16/22 0340 10/16/22 0737  GLUCAP 109* 118* 107* 112* 125*      Recent Results (from the past 240 hour(s))   Expectorated Sputum Assessment w Gram Stain, Rflx to Resp Cult     Status: None   Collection Time: 10/11/22 10:58 AM   Specimen: Tracheal Aspirate; Sputum  Result Value Ref Range Status   Specimen Description EXPECTORATED SPUTUM  Final   Special Requests NONE  Final   Sputum evaluation   Final    THIS SPECIMEN IS ACCEPTABLE FOR SPUTUM CULTURE Performed at [facility], [address] [place], [place] [address]    Report Status 10/11/2022 FINAL  Final  Culture, Respiratory w Gram Stain     Status: None   Collection Time: 10/11/22 10:58 AM  Result Value Ref Range Status   Specimen Description EXPECTORATED SPUTUM  Final   Special Requests NONE Reflexed from [id number]  Final   Gram Stain   Final    ABUNDANT WBC PRESENT, PREDOMINANTLY PMN MODERATE GRAM POSITIVE COCCI IN PAIRS IN CLUSTERS MODERATE GRAM POSITIVE RODS RARE GRAM NEGATIVE RODS    Culture   Final    MODERATE STAPHYLOCOCCUS AUREUS No Pseudomonas species isolated Performed at [facility], [address], [place], [place] [address]    Report Status 10/14/2022 FINAL  Final   Organism ID, Bacteria STAPHYLOCOCCUS AUREUS  Final      Susceptibility   Staphylococcus aureus - MIC*    CIPROFLOXACIN <=0.5 SENSITIVE Sensitive     ERYTHROMYCIN >=8 RESISTANT Resistant     GENTAMICIN <=0.5 SENSITIVE Sensitive     OXACILLIN 0.5 SENSITIVE Sensitive     TETRACYCLINE <=1 SENSITIVE Sensitive     VANCOMYCIN 1 SENSITIVE Sensitive     TRIMETH/SULFA <=10 SENSITIVE Sensitive     CLINDAMYCIN RESISTANT Resistant     RIFAMPIN <=0.5 SENSITIVE Sensitive     Inducible Clindamycin POSITIVE Resistant     * MODERATE STAPHYLOCOCCUS AUREUS      Radiology Studies: No results found.     LOS: 45 days   Travelle Mcclimans [employer] on [email or web address]  10/16/2022, 9:27 AM

## 2022-10-16 NOTE — Plan of Care (Signed)
  Problem: Clinical Measurements: Goal: Will remain free from infection Outcome: Progressing Goal: Diagnostic test results will improve Outcome: Progressing Goal: Respiratory complications will improve Outcome: Progressing Goal: Cardiovascular complication will be avoided Outcome: Progressing   

## 2022-10-16 NOTE — Progress Notes (Signed)
PHARMACY NOTE:  ANTIMICROBIAL RENAL DOSAGE ADJUSTMENT  Current antimicrobial regimen includes a mismatch between antimicrobial dosage and estimated renal function.  As per policy approved by the Pharmacy & Therapeutics and Medical Executive Committees, the antimicrobial dosage will be adjusted accordingly.  Current antimicrobial dosage:  meropenem 1g IV Q12H   Indication: HAP   Renal Function:  Estimated Creatinine Clearance: 89.6 mL/min (by C-G formula based on SCr of 1.03 mg/dL). []      On intermittent HD, scheduled: []      On CRRT    Antimicrobial dosage has been changed to:  meropenem 1g IV Q8H    Thank you for allowing pharmacy to be a part of this patient's care.  , PharmD PGY-2 Infectious Diseases Resident  10/16/2022 8:04 AM

## 2022-10-17 DIAGNOSIS — I610 Nontraumatic intracerebral hemorrhage in hemisphere, subcortical: Secondary | ICD-10-CM

## 2022-10-17 LAB — CBC WITH DIFFERENTIAL/PLATELET
Abs Immature Granulocytes: 0 10*3/uL (ref 0.00–0.07)
Basophils Absolute: 0.5 10*3/uL — ABNORMAL HIGH (ref 0.0–0.1)
Basophils Relative: 1 %
Eosinophils Absolute: 1.6 10*3/uL — ABNORMAL HIGH (ref 0.0–0.5)
Eosinophils Relative: 3 %
HCT: 39.5 % (ref 39.0–52.0)
Hemoglobin: 12.3 g/dL — ABNORMAL LOW (ref 13.0–17.0)
Lymphocytes Relative: 79 %
Lymphs Abs: 43.1 10*3/uL — ABNORMAL HIGH (ref 0.7–4.0)
MCH: 29 pg (ref 26.0–34.0)
MCHC: 31.1 g/dL (ref 30.0–36.0)
MCV: 93.2 fL (ref 80.0–100.0)
Monocytes Absolute: 1.6 10*3/uL — ABNORMAL HIGH (ref 0.1–1.0)
Monocytes Relative: 3 %
Neutro Abs: 7.6 10*3/uL (ref 1.7–7.7)
Neutrophils Relative %: 14 %
Platelets: 264 10*3/uL (ref 150–400)
RBC: 4.24 MIL/uL (ref 4.22–5.81)
RDW: 12.8 % (ref 11.5–15.5)
WBC: 54.6 10*3/uL (ref 4.0–10.5)
nRBC: 0 % (ref 0.0–0.2)
nRBC: 0 /100 WBC

## 2022-10-17 LAB — GLUCOSE, CAPILLARY
Glucose-Capillary: 95 mg/dL (ref 70–99)
Glucose-Capillary: 122 mg/dL — ABNORMAL HIGH (ref 70–99)
Glucose-Capillary: 95 mg/dL (ref 70–99)
Glucose-Capillary: 95 mg/dL (ref 70–99)
Glucose-Capillary: 103 mg/dL — ABNORMAL HIGH (ref 70–99)
Glucose-Capillary: 107 mg/dL — ABNORMAL HIGH (ref 70–99)

## 2022-10-17 LAB — BASIC METABOLIC PANEL
Anion gap: 8 (ref 5–15)
BUN: 8 mg/dL (ref 8–23)
CO2: 24 mmol/L (ref 22–32)
Calcium: 9.1 mg/dL (ref 8.9–10.3)
Chloride: 104 mmol/L (ref 98–111)
Creatinine, Ser: 0.8 mg/dL (ref 0.61–1.24)
GFR, Estimated: >60 mL/min (ref >60)
Glucose, Bld: 124 mg/dL — ABNORMAL HIGH (ref 70–99)
Potassium: 4 mmol/L (ref 3.5–5.1)
Sodium: 136 mmol/L (ref 135–145)

## 2022-10-17 NOTE — Progress Notes (Signed)
[facility] PROGRESS NOTE   Dustin Jones LU:[id number] DOB: 26-Jun-1961 DOA: 08/10/2022  PCP: Marliss Coots, NP  Brief History/Interval Summary: 61 year old with past medical history significant for hypertension, history of DVT admitted 9/28 as a code stroke, admitted to ICU with working diagnosis of intracranial hemorrhage right MCA ruptured aneurysm, has had prolonged hospitalization including multiple procedures, work-up including right ICA coil embolization of right ICA terminus aneurysm on 08/28/2022, tracheostomy by CCM on 10/10 and transferred to [facility] on 10/22.   9/28 admitted intracranial hemorrhage right MCA ruptured aneurysm status post CT head. 9/29: MRI brain large acute intraparenchymal hemorrhage extension into the right greater than left lateral ventricle, third ventricle and fourth ventricle. 9/29 developed fever and pneumonia, MSSA and respiratory culture white blood cell up to 56.  Treated. 10/15 transferred to the hospitalist service, transferred back to ICU for worsening respiratory status.  Was placed back on vent. 10/26; IVC filter and PEG tube placed by IR. 10/28: After conference call with family CODE STATUS was changed to DNR/DNI do not placed back on vent support. 11/01: Speech recommend Dysphagia 3 diet.  11/07 started Bactrim to cover for PNA.     Subjective/Interval History: Overnight events noted.  Patient was noted to be lying on the floor.  Apparently was trying to go to the bathroom.  Patient denies any neck pain arm pain leg pain back pain and headaches.  Patient was told that he needs to call for help and not ready to go to the bathroom on his own.   Assessment/Plan:  Acute Hypoxic Respiratory Failure secondary to Pneumonia Pneumonia due to MSSA and Enterobacter Status post tracheostomy in place on trach collar Patient has completed multiple courses of antibiotics. Trach management per critical care medicine.  Last seen by pulmonology on  11/6.  They mentioned that he is not a candidate for decannulation.  Recommended continuing routine trach care. On 11/6 patient had low-grade fever.  Leukocytosis was noted from his baseline.  He was started on Bactrim to cover panel resistant Enterobacter cloacae noted previously. Chest x-ray showed mild patchy opacities. Sputum cultures positive for Staph aureus.  Sensitive to Bactrim. Due to rising creatinine Bactrim was changed over to meropenem on 11/11.  Plan for 7-day course in total. Respiratory status is stable.  He remains afebrile.  Acute kidney injury Creatinine had climbed significantly over 48 hours and noted to be 1.78 on 11/11.  ACE inhibitor was held.  Bactrim was changed over to meropenem.  Patient was given IV fluids.  Creatinine has become normal.  Urine output has picked up.  Continue to hold ACE inhibitor.   Decrease IV fluids.   Intracranial Hemorrhage right MCA rupture aneurysmal:  Status post R ICA coil embolization of right ICA terminus aneurysm on 08/31/2022 Remains quite distracted.  He does have left-sided hemiparesis. Neurologic status is stable.  Dysphagia Status post PEG tube placed by IR 10/26 continue with tube feedings Speech therapy recommended dysphagia 3 diet 11/1. Now on nocturnal tube feeding.    Bilateral DVTs Not a candidate for anticoagulation due to [place], status post IVC filter in 09/29/2022 by IR.   Essential hypertension Blood pressure is reasonably well controlled.  Continue with amlodipine, hydralazine, and metoprolol.  Lisinopril held due to elevated creatinine.   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 For past several days it has been between 35-40.   Increased with infection.  Hypovolemia also contributed.  Now seems to be  again on a downward trend.  Continue to monitor. He will need to be seen by hematology eventually.   Diabetes mellitus type 2 CBGs are reasonably well controlled.  Continue  SSI.  Fall on 11/13 No obvious injuries noted.  Patient has been firmly instructed to call for help whenever he needs to use the bathroom.   Thrombocytopenia: Resolved Nonsustained V. tach: Continue with metoprolol.  Oral thrush: Diflucan 11/6 for 5 days.   Obesity Class 3  Estimated body mass index is 39.54 kg/m as calculated from the following:   Height as of this encounter: 5\' 7"  (1.702 m).   Weight as of this encounter: 114.5 kg.  DVT Prophylaxis: Subcutaneous heparin Code Status: DNR Family Communication: No family at bedside Disposition Plan: It appears the plan is for him to go to skilled nursing facility in [place]  Status is: Inpatient Remains inpatient appropriate because: Unsafe disposition      Medications: Scheduled:  amLODipine  10 mg Per Tube Daily   arformoterol  15 mcg Nebulization BID   famotidine  20 mg Per Tube Daily   feeding supplement (JEVITY 1.5 CAL/FIBER)  1,000 mL Per Tube Q24H   fiber  1 packet Per Tube BID   free water  300 mL Per Tube Q4H   heparin injection (subcutaneous)  5,000 Units Subcutaneous Q8H   hydrALAZINE  10 mg Per Tube BID   insulin aspart  0-15 Units Subcutaneous Q4H   metoprolol tartrate  25 mg Per Tube BID   mouth rinse  15 mL Mouth Rinse BID   revefenacin  175 mcg Nebulization Daily   saccharomyces boulardii  250 mg Per Tube BID   Continuous:  meropenem (MERREM) IV 1 g (10/17/22 0606)    [id number] **OR** acetaminophen (TYLENOL) oral liquid 160 mg/5 mL **OR** acetaminophen, albuterol, fentaNYL (SUBLIMAZE) injection, mouth rinse, oxyCODONE  Antibiotics: Anti-infectives (From admission, onward)    Start     Dose/Rate Route Frequency Ordered Stop   10/16/22 0900  meropenem (MERREM) 1 g in sodium chloride 0.9 % 100 mL IVPB        1 g 200 mL/hr over 30 Minutes Intravenous Every 8 hours 10/16/22 0803 10/17/22 2159   10/15/22 1000  meropenem (MERREM) 1 g in sodium chloride 0.9 % 100 mL IVPB  Status:  Discontinued         1 g 200 mL/hr over 30 Minutes Intravenous Every 12 hours 10/15/22 0842 10/16/22 0803   10/11/22 1200  sulfamethoxazole-trimethoprim (BACTRIM DS) 800-160 MG per tablet 1 tablet  Status:  Discontinued        1 tablet Oral Every 12 hours 10/11/22 1106 10/15/22 0842   10/10/22 1000  fluconazole (DIFLUCAN) 40 MG/ML suspension 100 mg        100 mg Per Tube Daily 10/10/22 0835 10/14/22 1003   10/05/22 1415  fluconazole (DIFLUCAN) 40 MG/ML suspension 100 mg  Status:  Discontinued        100 mg Oral Daily 10/05/22 1319 10/10/22 0835   09/29/22 1445  ceFAZolin (ANCEF) IVPB 2g/100 mL premix  Status:  Discontinued        2 g 200 mL/hr over 30 Minutes Intravenous  Once 09/29/22 1346 10/12/22 1148   09/29/22 1145  ceFAZolin (ANCEF) IVPB 1 g/50 mL premix  Status:  Discontinued        1 g 100 mL/hr over 30 Minutes Intravenous To Radiology 09/29/22 1057 09/29/22 1059   09/29/22 1145  ceFAZolin (ANCEF) IVPB 2g/100 mL premix  2 g 200 mL/hr over 30 Minutes Intravenous  Once 09/29/22 1059 09/29/22 1159   09/14/22 1400  ceFEPIme (MAXIPIME) 2 g in sodium chloride 0.9 % 100 mL IVPB        2 g 200 mL/hr over 30 Minutes Intravenous Every 8 hours 09/14/22 1312 09/21/22 0922   09/10/22 0830  ceFAZolin (ANCEF) IVPB 1 g/50 mL premix  Status:  Discontinued        1 g 100 mL/hr over 30 Minutes Intravenous Every 8 hours 09/10/22 0738 09/10/22 0821   09/10/22 0830  ceFAZolin (ANCEF) IVPB 2g/100 mL premix  Status:  Discontinued        2 g 200 mL/hr over 30 Minutes Intravenous Every 8 hours 09/10/22 0821 09/14/22 1312   09/07/22 0815  ceFAZolin (ANCEF) IVPB 2g/100 mL premix        2 g 200 mL/hr over 30 Minutes Intravenous Every 8 hours 09/07/22 0807 09/08/22 1742   09/04/22 2200  Ampicillin-Sulbactam (UNASYN) 3 g in sodium chloride 0.9 % 100 mL IVPB  Status:  Discontinued        3 g 200 mL/hr over 30 Minutes Intravenous Every 24 hours 09/04/22 0722 09/04/22 1506   09/04/22 2200  ceFAZolin (ANCEF) IVPB 1 g/50  mL premix  Status:  Discontinued        1 g 100 mL/hr over 30 Minutes Intravenous Every 12 hours 09/04/22 1506 09/07/22 0807   09/02/22 1030  Ampicillin-Sulbactam (UNASYN) 3 g in sodium chloride 0.9 % 100 mL IVPB  Status:  Discontinued        3 g 200 mL/hr over 30 Minutes Intravenous Every 6 hours 09/02/22 1009 09/04/22 0722       Objective:  Vital Signs  Vitals:   10/17/22 0449 10/17/22 0459 10/17/22 0732 10/17/22 0736  BP:    (!) 142/102  Pulse:   71 72  Resp:   20 20  Temp:    98.6 F (37 C)  TempSrc:    Oral  SpO2: 99%   99%  Weight:  114.5 kg    Height:        Intake/Output Summary (Last 24 hours) at 10/17/2022 1035 Last data filed at 10/17/2022 0900 Gross per 24 hour  Intake 680 ml  Output 2850 ml  Net -2170 ml    Filed Weights   10/14/22 0500 10/15/22 0500 10/17/22 0459  Weight: 111.5 kg 111 kg 114.5 kg    General appearance: Awake alert.  In no distress Tracheostomy noted Resp: Clear to auscultation bilaterally.  Normal effort Cardio: S1-S2 is normal regular.  No S3-S4.  No rubs murmurs or bruit GI: Abdomen is soft.  Nontender nondistended.  Bowel sounds are present normal.  No masses organomegaly.  PEG tube noted. Moving all of his extremities.  No obvious swelling of the joints or extremities noted.  No obvious injuries identified.   Lab Results:  Data Reviewed: I have personally reviewed following labs and reports of the imaging studies  CBC: Recent Labs  Lab 10/12/22 0534 10/14/22 0322 10/15/22 0235 10/16/22 0658 10/17/22 0336  WBC 46.2* 53.9* 61.9* 61.6* 54.6*  NEUTROABS  --   --   --  7.4 7.6  HGB 13.3 13.0 12.5* 12.8* 12.3*  HCT 41.8 41.0 38.6* 39.1 39.5  MCV 92.1 94.0 90.6 91.6 93.2  PLT 226 255 245 263 264     Basic Metabolic Panel: Recent Labs  Lab 10/12/22 0534 10/14/22 0322 10/15/22 0235 10/16/22 0658 10/17/22 0336  NA 138 135 137  138 136  K 4.1 4.1 4.1 4.8 4.0  CL 103 102 105 107 104  CO2 25 24 23  20* 24  GLUCOSE  128* 116* 115* 130* 124*  BUN 10 13 15 12 8   CREATININE 0.88 1.33* 1.78* 1.03 0.80  CALCIUM 9.7 9.4 9.6 9.3 9.1  MG  --  2.2  --   --   --      GFR: Estimated Creatinine Clearance: 117.3 mL/min (by C-G formula based on SCr of 0.8 mg/dL).  CBG: Recent Labs  Lab 10/16/22 1537 10/16/22 2041 10/16/22 2343 10/17/22 0402 10/17/22 0803  GLUCAP 100* 112* 109* 95 122*      Recent Results (from the past 240 hour(s))  Expectorated Sputum Assessment w Gram Stain, Rflx to Resp Cult     Status: None   Collection Time: 10/11/22 10:58 AM   Specimen: Tracheal Aspirate; Sputum  Result Value Ref Range Status   Specimen Description EXPECTORATED SPUTUM  Final   Special Requests NONE  Final   Sputum evaluation   Final    THIS SPECIMEN IS ACCEPTABLE FOR SPUTUM CULTURE Performed at [facility], [place] [address], [place], [place] [address]    Report Status 10/11/2022 FINAL  Final  Culture, Respiratory w Gram Stain     Status: None   Collection Time: 10/11/22 10:58 AM  Result Value Ref Range Status   Specimen Description EXPECTORATED SPUTUM  Final   Special Requests NONE Reflexed from [id number]  Final   Gram Stain   Final    ABUNDANT WBC PRESENT, PREDOMINANTLY PMN MODERATE GRAM POSITIVE COCCI IN PAIRS IN CLUSTERS MODERATE GRAM POSITIVE RODS RARE GRAM NEGATIVE RODS    Culture   Final    MODERATE STAPHYLOCOCCUS AUREUS No Pseudomonas species isolated Performed at [facility], [address], [place], [place] [address]    Report Status 10/14/2022 FINAL  Final   Organism ID, Bacteria STAPHYLOCOCCUS AUREUS  Final      Susceptibility   Staphylococcus aureus - MIC*    CIPROFLOXACIN <=0.5 SENSITIVE Sensitive     ERYTHROMYCIN >=8 RESISTANT Resistant     GENTAMICIN <=0.5 SENSITIVE Sensitive     OXACILLIN 0.5 SENSITIVE Sensitive     TETRACYCLINE <=1 SENSITIVE Sensitive     VANCOMYCIN 1 SENSITIVE Sensitive     TRIMETH/SULFA <=10 SENSITIVE Sensitive     CLINDAMYCIN RESISTANT Resistant      RIFAMPIN <=0.5 SENSITIVE Sensitive     Inducible Clindamycin POSITIVE Resistant     * MODERATE STAPHYLOCOCCUS AUREUS      Radiology Studies: No results found.     LOS: 25 days   Dustin Jones [employer] on [email or web address]  10/17/2022, 10:35 AM

## 2022-10-17 NOTE — Progress Notes (Signed)
NAME:  ELAINE MIDDLETON, MRN:  [id number], DOB:  29-Dec-1960, LOS: 46 ADMISSION DATE:  11-Sep-2022, CONSULTATION DATE:  Sep 11, 2022 REFERRING MD:  Anitra Lauth, EDP, CHIEF COMPLAINT:  ICH   History of Present Illness:  61 year old man who presented to [facility] 9/28 as a Code Stroke. PMHx significant for HTN, DVT and chronic tobacco use.   Admitted with a right intraparenchymal hemorrhage in the setting of right ICA aneurysm status post coiling.  Course complicated by recurrent pneumonia and respiratory failure requiring tracheostomy He was transferred to the floor 10/15 but transfer back due to respiratory distress on 10/16  Pertinent  Medical History  HTN, tobacco use, and DVT   Significant Hospital Events: Including procedures, antibiotic start and stop dates in addition to other pertinent events   9/28 admitted. Intracranial Hemorrhage/ R MCA ruptured aneurysm Non-con CT of head demonstrating large acute hemorrhage in the R lentiform nucleus with with intraventricular extension, midline shift, and slight rounding of the temporal horns, possibly representing early hydrocephalus. On CTA, pt found to have a R MCA ruptured aneurysm. Neurosurgery consulted and following with plans for possible arterial clipping this evening. CT of the face and cervical spine did not show any acute traumatic injuries, though BL cervical lymphadenopathy was noted. Echo with normal biventricular function without evidence of hemodynamically sig valvular heart disease.  9/28 complete echo with LVEF of 60-65%, no regional wall motion abnormalities but with grade 1 diastolic dysfunction.  Normal right ventricular systolic function.  No valvular abnormalities. 9/29 MRI brain 1. Large acute intraparenchymal hemorrhage centered in the rightlentiform nucleus, with extension into the right-greater-than-left lateral ventricle, third ventricle, and fourth ventricle, overall similar to the prior CT. Mild mass effect and 5 mm of right-to-left  midline shift. No hydrocephalus. 2. Small areas of acute infarct in the bilateral occipital lobes, bilateral frontal lobes, and right parietal lobe. Spiking fever. Has sig leukocytosis. Cultures sent. Unasyn started for aspiration  9/29 respiratory culture positive for MSSA 9/29 self extubated, required reintubation 10/2 Family identified  10/6 bronchospasm, steroids of this, bronc with clear lungs, ETT 10/9 Neuro exam/mental status remains poor. Coming down on vent requirements. BP improved, remains off of Cleviprex. D/w family re: tracheostomy. 10/10 Remains hypertensive, agitation. Sedation adjusted (Precedex, Fentanyl/Versed PRN). Increased peak pressures on vent. CXR worse with asymmetric pulmonary edema. Lasix given. Trach with CCM. 10/11 Cortrak placed 10/11 ID consulted, abx changed from Cefazolin to Cefepime 10/13 WBC up to 56 (was 49 day prior). CT chest/abd/pelv ordered. 10/15-transferred to the hospitalist service 10/16-Transferred to ICU for worsening respiratory status  10/18: Patient was taken off of ventilator and put on trach collar, patient initially did well, and then started having trouble breathing and was belly breathing, and therefore was back on the vent 10/20 Unable to tolerate Passy-Muir valve per speech therapy, Able to participate with PT 10/22- 09/26/2022>> on 60% ATC, tolerating well. Sats 93% 10/10/2022 >> on 28% ATC, Clearing secretions, strong cough 11/13  remains on ATC 28% 5L Interim History / Subjective:   Nods to call of name.   No acute changes.  Objective   Blood pressure (!) 142/102, pulse 72, temperature 98.6 F (37 C), temperature source Oral, resp. rate 20, height 5\' 7"  (1.702 m), weight 114.5 kg, SpO2 99 %.    FiO2 (%):  [21 %] 21 %   Intake/Output Summary (Last 24 hours) at 10/17/2022 1011 Last data filed at 10/17/2022 0900 Gross per 24 hour  Intake 680 ml  Output 2850 ml  Net -2170  ml    Filed Weights   10/14/22 0500 10/15/22 0500  10/17/22 0459  Weight: 111.5 kg 111 kg 114.5 kg    Examination:  General: 61 year old male patient lying in bed no acute distress HEENT tracheostomy is midline, thick tan secretions on gauze Pulmonary: Bilateral chest excursion, Coarse scattered rhonchi, strong cough Cardiac: Regular rate and rhythm, S1, S1, No RMG Abdomen: Soft not tender, ND, BS +, Body mass index is 39.54 kg/m.  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   Dysphagia PEG placed  Bilateral DVT HTN CLL vs Leukoid reaction  Discussion  Was previously not under consideration for decannulation secondary to minimal cough, responsiveness and thick secretions He tolerated PMSV for about 20 minutes today, verbalizes a couple of words Currently on 28% aerosol trach collar  Plan Continue routine trach care Tolerating PMV intermittently, still somewhat lethargic with weak cough and secretions No plan for downsizing today, if he continues to improve would consider Made progress to 28% ATC   Otilio Carpen Aalyah Mansouri, PA-C [facility] See Amion for pager If no response to pager , please call [phone] until 7pm After 7:00 pm call Elink  [phone]?[place]

## 2022-10-17 NOTE — Progress Notes (Signed)
Speech Language Pathology Treatment: Dysphagia;Cognitive-Linquistic;[facility] Speaking valve  Patient Details Name: Dustin Jones MRN: [id number] DOB: 08/08/1961 Today's Date: 10/17/2022 Time: 2542-7062 SLP Time Calculation (min) (ACUTE ONLY): 30 min  Assessment / Plan / Recommendation Clinical Impression  Pt seen at bedside for continued skilled ST intervention, targeting goals for PMSV tolerance, functional and effective communication, and diet tolerance/advancement/adherence to safe swallow strategies. RN reports pt self fed some of his breakfast this morning. Upon arrival of SLP, pt had his eyes closed, sleeping. He required verbal and gentle tactile stimulation. Flat affect observed.   Cuff was deflated upon arrival of SLP. Pt tolerated placement of PMSV for ~20 minutes. Pt was able to answer yes/no questions nonverbally. Other questions requiring a verbal response were not answered/ignored. The only verbalization from pt this session was "it's ok". Pt declined PO trials, despite encouragement and explanation of reasoning (advanced diet). Pt declined/refused to follow commands. RN reported flat affect and monotone voicing. He exhibited this with me as well, and appeared to be apathetic throughout this session.   Tether was attached to valve and trach tie to facilitate donning and doffing of valve. Pt was asked if he wanted to stay awake, watch TV and keep the valve on, or go back to sleep and have it removed. Pt was reminded that the valve needs to be removed when sleeping, and during breathing treatments. Pt closed his eyes in response. PMSV was removed and left hanging from trach tie by tether. RN informed. SLP will continue to follow pt per POC, however, if pt continues to be unwilling/unable to participate, pt will be DC'd from treatment.   HPI HPI: Dustin Jones is a 61 y.o. male who is homeless, presenting 09-13-22 after being found down by bystanders. Imaging revealed acute bil  intraparenchymal hemorrhage with intraventricular extension and developing hydrocephalus, likely due to rupture of R ICA aneurysm. S/P coil embolization of R ICA aneurysm 9/28. Intubated 9/29. Trach placement 10/10. Cortrak 10/11; G-tube 10/26.  No PMH on file. MBS 10/05/22 D3/thin, crushed meds      SLP Plan  Continue with current plan of care      Recommendations for follow up therapy are one component of a multi-disciplinary discharge planning process, led by the attending physician.  Recommendations may be updated based on patient status, additional functional criteria and insurance authorization.    Recommendations  Diet recommendations: Dysphagia 3 (mechanical soft);Thin liquid Liquids provided via: Cup;Straw Medication Administration: Crushed with puree Supervision: Staff to assist with self feeding (RN reports pt self fed most of breakfast today) Compensations: Slow rate;Small sips/bites;Minimize environmental distractions;Follow solids with liquid Postural Changes and/or Swallow Maneuvers: Seated upright 90 degrees      Patient may use Passy-Muir Speech Valve: During all waking hours (remove during sleep);During PO intake/meals PMSV Supervision: Intermittent MD: Please consider changing trach tube to : Smaller size;Cuffless         Oral Care Recommendations: Oral care QID Follow Up Recommendations: Skilled nursing-short term rehab (<3 hours/day) Assistance recommended at discharge: Frequent or constant Supervision/Assistance SLP Visit Diagnosis: Dysphagia, oral phase (R13.11);Cognitive communication deficit (R41.841) Plan: Continue with current plan of care          Sullivan Jacuinde B. Murvin Natal, [facility], CCC-SLP Speech Language Pathologist Office: [phone]  Leigh Aurora 10/17/2022, 11:57 AM

## 2022-10-17 NOTE — Progress Notes (Signed)
Pt found on floor by staff. Stated he was trying to get up to the bathroom for a bowel movement. Pt denies pain, no injuries noted upon assessment.Vital signs stable. MD on-call notified. Will call family in the morning. Will continue to monitor pt.

## 2022-10-18 DIAGNOSIS — I10 Essential (primary) hypertension: Secondary | ICD-10-CM

## 2022-10-18 LAB — GLUCOSE, CAPILLARY
Glucose-Capillary: 130 mg/dL — ABNORMAL HIGH (ref 70–99)
Glucose-Capillary: 126 mg/dL — ABNORMAL HIGH (ref 70–99)
Glucose-Capillary: 109 mg/dL — ABNORMAL HIGH (ref 70–99)
Glucose-Capillary: 101 mg/dL — ABNORMAL HIGH (ref 70–99)
Glucose-Capillary: 103 mg/dL — ABNORMAL HIGH (ref 70–99)

## 2022-10-18 LAB — BASIC METABOLIC PANEL
Anion gap: 10 (ref 5–15)
BUN: 9 mg/dL (ref 8–23)
CO2: 20 mmol/L — ABNORMAL LOW (ref 22–32)
Calcium: 9.4 mg/dL (ref 8.9–10.3)
Chloride: 108 mmol/L (ref 98–111)
Creatinine, Ser: 0.78 mg/dL (ref 0.61–1.24)
GFR, Estimated: >60 mL/min (ref >60)
Glucose, Bld: 120 mg/dL — ABNORMAL HIGH (ref 70–99)
Potassium: 4.7 mmol/L (ref 3.5–5.1)
Sodium: 138 mmol/L (ref 135–145)

## 2022-10-18 LAB — CBC WITH DIFFERENTIAL/PLATELET
Abs Immature Granulocytes: 0.11 10*3/uL — ABNORMAL HIGH (ref 0.00–0.07)
Basophils Absolute: 0.2 10*3/uL — ABNORMAL HIGH (ref 0.0–0.1)
Basophils Relative: 0 %
Eosinophils Absolute: 0.4 10*3/uL (ref 0.0–0.5)
Eosinophils Relative: 1 %
HCT: 42.7 % (ref 39.0–52.0)
Hemoglobin: 13.4 g/dL (ref 13.0–17.0)
Immature Granulocytes: 0 %
Lymphocytes Relative: 85 %
Lymphs Abs: 48.5 10*3/uL — ABNORMAL HIGH (ref 0.7–4.0)
MCH: 29.1 pg (ref 26.0–34.0)
MCHC: 31.4 g/dL (ref 30.0–36.0)
MCV: 92.8 fL (ref 80.0–100.0)
Monocytes Absolute: 5.3 10*3/uL — ABNORMAL HIGH (ref 0.1–1.0)
Monocytes Relative: 9 %
Neutro Abs: 3.1 10*3/uL (ref 1.7–7.7)
Neutrophils Relative %: 5 %
Platelets: 253 10*3/uL (ref 150–400)
RBC: 4.6 MIL/uL (ref 4.22–5.81)
RDW: 12.7 % (ref 11.5–15.5)
WBC: 57.6 10*3/uL (ref 4.0–10.5)
nRBC: 0 % (ref 0.0–0.2)

## 2022-10-18 LAB — PATHOLOGIST SMEAR REVIEW

## 2022-10-18 MED ORDER — FREE WATER
200.0000 mL | Freq: Three times a day (TID) | Status: DC
  Administered 2022-10-18 – 2022-10-25 (×21): 200 mL

## 2022-10-18 MED ORDER — JEVITY 1.5 CAL/FIBER PO LIQD
237.0000 mL | Freq: Three times a day (TID) | ORAL | Status: DC
  Administered 2022-10-20 – 2022-10-25 (×15): 237 mL
  Filled 2022-10-18 (×24): qty 237

## 2022-10-18 MED ORDER — METOPROLOL TARTRATE 50 MG PO TABS
50.0000 mg | ORAL_TABLET | Freq: Two times a day (BID) | ORAL | Status: DC
  Administered 2022-10-18 – 2022-10-27 (×19): 50 mg
  Filled 2022-10-18 (×20): qty 1

## 2022-10-18 NOTE — Progress Notes (Signed)
Physical Therapy Treatment Patient Details Name: Dustin Jones MRN: [id number] DOB: 1961-01-19 Today's Date: 10/18/2022   History of Present Illness The pt is a 61 y.o. unidentified homeless male presenting 9/28 after being found down by bystanders. 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 S/P coil embolization of R ICA aneurysm 9/28. Intubated 9/29. Trach placement 10/10.  Back to ICU 10/15 for respiratory failure, had IVC filter placed 10/26 and PEG.  No PMH on file.    PT Comments    Pt with continued progress this session. Pt able to complete bed mobility with at a grossly min guard level and maintain static sitting at EOB without assist. Pt continues to demonstrate some L inattention with gaze slightly directed toward right throughout and inattention to LUE in sitting. Pt able to come to stand in EVA walker with min assist to power up and steady, total assist needed to place LUE on EVA and maintain hand contact/grip on handle. Pt needing mod a +2 throughout gait training with EVA walker with this PTA on L with R leg behind pt LLE and between LEs to facilitate and advance LLE throughout swing phase and block L knee from hyperextension in stance as well as maintain wider BOS throughout. Pt continues to benefit from skilled PT services to progress toward functional mobility goals.    Recommendations for follow up therapy are one component of a multi-disciplinary discharge planning process, led by the attending physician.  Recommendations may be updated based on patient status, additional functional criteria and insurance authorization.  Follow Up Recommendations  Skilled nursing-short term rehab (<3 hours/day) Can patient physically be transported by private vehicle: No   Assistance Recommended at Discharge Frequent or constant Supervision/Assistance  Patient can return home  with the following Two people to help with walking and/or transfers;Two people to help with bathing/dressing/bathroom;Assistance with cooking/housework;Assistance with feeding;Direct supervision/assist for medications management;Direct supervision/assist for financial management;Help with stairs or ramp for entrance;Assist for transportation   Equipment Recommendations  Other (comment) (TBD)    Recommendations for Other Services       Precautions / Restrictions Precautions Precautions: Fall Precaution Comments: trach, PEG Restrictions Weight Bearing Restrictions: No     Mobility  Bed Mobility Overal bed mobility: Needs Assistance Bed Mobility: Rolling, Sidelying to Sit, Sit to Sidelying Rolling: Min assist (toward L) Sidelying to sit: Min assist       General bed mobility comments: min assist to elevate trunk    Transfers Overall transfer level: Needs assistance Equipment used: Bilateral platform walker (EVA walker) Transfers: Sit to/from Stand, Bed to chair/wheelchair/BSC Sit to Stand: Min assist, +2 physical assistance           General transfer comment: min assist to come to stand with EVA walker from EOB and reclienr with pt demonstrating good power up    Ambulation/Gait Ambulation/Gait assistance: Mod assist, +2 physical assistance, +2 safety/equipment Gait Distance (Feet): 20 Feet (+ 25') Assistive device: Bilateral platform walker (EVA walker) Gait Pattern/deviations: Step-through pattern, Decreased stride length, Narrow base of support, Step-to pattern Gait velocity: decr     General Gait Details: step-to pattern with this PTA on L with R leg behind pt LLE and between LEs to facilitate and advance LLE throughout swing phase and block L knee from hyperextension in stance as well as maintain wider BOS throughout as pt with tendency to step on own feet, encouraging pt to work one step at  a time as pt with tendency to want to advance feet too quickly   Stairs              Wheelchair Mobility    Modified Rankin (Stroke Patients Only) Modified Rankin (Stroke Patients Only) Pre-Morbid Rankin Score: No symptoms Modified Rankin: Severe disability     Balance Overall balance assessment: Needs assistance Sitting-balance support: Feet supported Sitting balance-Leahy Scale: Fair Sitting balance - Comments: sditting EOB with feet supported and intermittent UE support   Standing balance support: Bilateral upper extremity supported Standing balance-Leahy Scale: Poor Standing balance comment: Pushing R and needing mod A to mainatin midline                            Cognition Arousal/Alertness: Awake/alert Behavior During Therapy: Flat affect Overall Cognitive Status: Impaired/Different from baseline Area of Impairment: Attention, Following commands, Problem solving, Safety/judgement                   Current Attention Level: Sustained   Following Commands: Follows one step commands with increased time, Follows one step commands inconsistently Safety/Judgement: Decreased awareness of deficits Awareness:  (Decreased awareness of R side of environment.) Problem Solving: Slow processing, Decreased initiation, Requires verbal cues General Comments: Pt with verbalizations during session,  Pt following all one step commands with increased time and inconsistently following 2 step commands. Very motivated to participate in therapy.        Exercises      General Comments        Pertinent Vitals/Pain Pain Assessment Pain Assessment: No/denies pain Pain Intervention(s): Monitored during session    Home Living                          Prior Function            PT Goals (current goals can now be found in the care plan section) Acute Rehab PT Goals PT Goal Formulation: Patient unable to participate in goal setting Time For Goal Achievement: 10/20/22 Progress towards PT goals: Progressing toward goals     Frequency    Min 2X/week      PT Plan Current plan remains appropriate    Co-evaluation              AM-PAC PT "6 Clicks" Mobility   Outcome Measure  Help needed turning from your back to your side while in a flat bed without using bedrails?: Total Help needed moving from lying on your back to sitting on the side of a flat bed without using bedrails?: Total Help needed moving to and from a bed to a chair (including a wheelchair)?: A Lot Help needed standing up from a chair using your arms (e.g., wheelchair or bedside chair)?: A Lot Help needed to walk in hospital room?: A Lot Help needed climbing 3-5 steps with a railing? : Total 6 Click Score: 9    End of Session Equipment Utilized During Treatment: Gait belt Activity Tolerance: Patient tolerated treatment well Patient left: with call bell/phone within reach;in chair;with chair alarm set Nurse Communication: Mobility status PT Visit Diagnosis: Other abnormalities of gait and mobility (R26.89);Hemiplegia and hemiparesis;Other symptoms and signs involving the nervous system (R29.898);Muscle weakness (generalized) (M62.81) Hemiplegia - Right/Left: Left Hemiplegia - dominant/non-dominant: Non-dominant Hemiplegia - caused by: Nontraumatic intracerebral hemorrhage     Time: 1137-1205 PT Time Calculation (min) (ACUTE ONLY): 28 min  Charges:  $Gait Training: 8-22  mins $Therapeutic Exercise: 8-22 mins                    Audry Riles. PTA Acute Rehabilitation Services Office: [place] 10/18/2022, 1:29 PM

## 2022-10-18 NOTE — Progress Notes (Signed)
[facility] PROGRESS NOTE   Dustin Jones [id number] DOB: Jul 13, 1961 DOA: 08/27/2022  PCP: Marliss Coots, NP  Brief History/Interval Summary: 61 year old with past medical history significant for hypertension, history of DVT admitted 9/28 as a code stroke, admitted to ICU with working diagnosis of intracranial hemorrhage right MCA ruptured aneurysm, has had prolonged hospitalization including multiple procedures, work-up including right ICA coil embolization of right ICA terminus aneurysm on 08/21/2022, tracheostomy by CCM on 10/10 and transferred to [facility] on 10/22.   9/28 admitted intracranial hemorrhage right MCA ruptured aneurysm status post CT head. 9/29: MRI brain large acute intraparenchymal hemorrhage extension into the right greater than left lateral ventricle, third ventricle and fourth ventricle. 9/29 developed fever and pneumonia, MSSA and respiratory culture white blood cell up to 56.  Treated. 10/15 transferred to the hospitalist service, transferred back to ICU for worsening respiratory status.  Was placed back on vent. 10/26; IVC filter and PEG tube placed by IR. 10/28: After conference call with family CODE STATUS was changed to DNR/DNI do not placed back on vent support. 11/01: Speech recommend Dysphagia 3 diet.  11/07 started Bactrim to cover for PNA.     Subjective/Interval History: No overnight events reported.  Patient noted to be lying comfortably on the bed.     Assessment/Plan:  Acute Hypoxic Respiratory Failure secondary to Pneumonia Pneumonia due to MSSA and Enterobacter Status post tracheostomy in place on trach collar Patient has completed multiple courses of antibiotics. Trach management per critical care medicine.  Last seen by pulmonology on 11/6.  They mentioned that he is not a candidate for decannulation.  Recommended continuing routine trach care. On 11/6 patient had low-grade fever.  Leukocytosis was noted from his baseline.  He was  started on Bactrim to cover panel resistant Enterobacter cloacae noted previously. Chest x-ray showed mild patchy opacities. Sputum cultures positive for Staph aureus.  Sensitive to Bactrim. Due to rising creatinine Bactrim was changed over to meropenem on 11/11.  Patient has completed course of antibiotics.  Respiratory status is stable.  He remains afebrile.    Acute kidney injury Creatinine had climbed significantly over 48 hours and noted to be 1.78 on 11/11.  ACE inhibitor was held.  Bactrim was changed over to meropenem.  Patient was given IV fluids. Creatinine is now back to normal.  Urine output has picked up.  We will discontinue IV fluids.  Continue to hold ACE inhibitor.    Intracranial Hemorrhage right MCA rupture aneurysmal:  Status post R ICA coil embolization of right ICA terminus aneurysm on 08/29/2022 Remains quite distracted.  He does have left-sided hemiparesis. Neurologic status is stable.  Dysphagia Status post PEG tube placed by IR 10/26 continue with tube feedings Speech therapy recommended dysphagia 3 diet 11/1. Now on nocturnal tube feeding.    Bilateral DVTs Not a candidate for anticoagulation due to [place], status post IVC filter in 09/29/2022 by IR.   Essential hypertension Patient currently on amlodipine, hydralazine and metoprolol.  Lisinopril had to be held due to elevated creatinine.  Occasional high blood pressure readings noted.  Will increase metoprolol to 50 mg twice a day.     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 Then it drifted down to stay between 35-40.  Increased with his respiratory infection.  Hypovolemia also contributed. Noted to be 57,000 this morning.  Continue to monitor periodically. He will need to be seen by hematology eventually.   Diabetes mellitus type 2  CBGs are reasonably well controlled.  Continue SSI.  Fall on 11/13 No obvious injuries noted.  Patient has been firmly instructed to call for  help whenever he needs to use the bathroom.   Thrombocytopenia: Resolved Nonsustained V. tach: Continue with metoprolol.  Oral thrush: Diflucan 11/6 for 5 days.   Obesity Class 3  Estimated body mass index is 39.02 kg/m as calculated from the following:   Height as of this encounter: 5\' 7"  (1.702 m).   Weight as of this encounter: 113 kg.  DVT Prophylaxis: Subcutaneous heparin Code Status: DNR Family Communication: No family at bedside Disposition Plan: It appears the plan is for him to go to skilled nursing facility in  Status is: Inpatient Remains inpatient appropriate because: Unsafe disposition      Medications: Scheduled:  amLODipine  10 mg Per Tube Daily   arformoterol  15 mcg Nebulization BID   famotidine  20 mg Per Tube Daily   feeding supplement (JEVITY 1.5 CAL/FIBER)  1,000 mL Per Tube Q24H   fiber  1 packet Per Tube BID   free water  300 mL Per Tube Q4H   heparin injection (subcutaneous)  5,000 Units Subcutaneous Q8H   hydrALAZINE  10 mg Per Tube BID   insulin aspart  0-15 Units Subcutaneous Q4H   metoprolol tartrate  25 mg Per Tube BID   mouth rinse  15 mL Mouth Rinse BID   revefenacin  175 mcg Nebulization Daily   saccharomyces boulardii  250 mg Per Tube BID   Continuous:    [place] **OR** acetaminophen (TYLENOL) oral liquid 160 mg/5 mL **OR** acetaminophen, albuterol, fentaNYL (SUBLIMAZE) injection, mouth rinse, oxyCODONE  Antibiotics: Anti-infectives (From admission, onward)    Start     Dose/Rate Route Frequency Ordered Stop   10/16/22 0900  meropenem (MERREM) 1 g in sodium chloride 0.9 % 100 mL IVPB        1 g 200 mL/hr over 30 Minutes Intravenous Every 8 hours 10/16/22 0803 10/18/22 0700   10/15/22 1000  meropenem (MERREM) 1 g in sodium chloride 0.9 % 100 mL IVPB  Status:  Discontinued        1 g 200 mL/hr over 30 Minutes Intravenous Every 12 hours 10/15/22 0842 10/16/22 0803   10/11/22 1200  sulfamethoxazole-trimethoprim  (BACTRIM DS) 800-160 MG per tablet 1 tablet  Status:  Discontinued        1 tablet Oral Every 12 hours 10/11/22 1106 10/15/22 0842   10/10/22 1000  fluconazole (DIFLUCAN) 40 MG/ML suspension 100 mg        100 mg Per Tube Daily 10/10/22 0835 10/14/22 1003   10/05/22 1415  fluconazole (DIFLUCAN) 40 MG/ML suspension 100 mg  Status:  Discontinued        100 mg Oral Daily 10/05/22 1319 10/10/22 0835   09/29/22 1445  ceFAZolin (ANCEF) IVPB 2g/100 mL premix  Status:  Discontinued        2 g 200 mL/hr over 30 Minutes Intravenous  Once 09/29/22 1346 10/12/22 1148   09/29/22 1145  ceFAZolin (ANCEF) IVPB 1 g/50 mL premix  Status:  Discontinued        1 g 100 mL/hr over 30 Minutes Intravenous To Radiology 09/29/22 1057 09/29/22 1059   09/29/22 1145  ceFAZolin (ANCEF) IVPB 2g/100 mL premix        2 g 200 mL/hr over 30 Minutes Intravenous  Once 09/29/22 1059 09/29/22 1159   09/14/22 1400  ceFEPIme (MAXIPIME) 2 g in sodium chloride 0.9 %  100 mL IVPB        2 g 200 mL/hr over 30 Minutes Intravenous Every 8 hours 09/14/22 1312 09/21/22 0922   09/10/22 0830  ceFAZolin (ANCEF) IVPB 1 g/50 mL premix  Status:  Discontinued        1 g 100 mL/hr over 30 Minutes Intravenous Every 8 hours 09/10/22 0738 09/10/22 0821   09/10/22 0830  ceFAZolin (ANCEF) IVPB 2g/100 mL premix  Status:  Discontinued        2 g 200 mL/hr over 30 Minutes Intravenous Every 8 hours 09/10/22 0821 09/14/22 1312   09/07/22 0815  ceFAZolin (ANCEF) IVPB 2g/100 mL premix        2 g 200 mL/hr over 30 Minutes Intravenous Every 8 hours 09/07/22 0807 09/08/22 1742   09/04/22 2200  Ampicillin-Sulbactam (UNASYN) 3 g in sodium chloride 0.9 % 100 mL IVPB  Status:  Discontinued        3 g 200 mL/hr over 30 Minutes Intravenous Every 24 hours 09/04/22 0722 09/04/22 1506   09/04/22 2200  ceFAZolin (ANCEF) IVPB 1 g/50 mL premix  Status:  Discontinued        1 g 100 mL/hr over 30 Minutes Intravenous Every 12 hours 09/04/22 1506 09/07/22 0807   09/02/22  1030  Ampicillin-Sulbactam (UNASYN) 3 g in sodium chloride 0.9 % 100 mL IVPB  Status:  Discontinued        3 g 200 mL/hr over 30 Minutes Intravenous Every 6 hours 09/02/22 1009 09/04/22 0722       Objective:  Vital Signs  Vitals:   10/18/22 0415 10/18/22 0440 10/18/22 0716 10/18/22 0742  BP:  (!) 125/98  (!) 148/98  Pulse:  75 72 95  Resp:  17 16 18   Temp:  98.8 F (37.1 C)  98.2 F (36.8 C)  TempSrc:  Oral    SpO2:  92% 95% 96%  Weight: 113 kg     Height:        Intake/Output Summary (Last 24 hours) at 10/18/2022 1026 Last data filed at 10/18/2022 0900 Gross per 24 hour  Intake 360 ml  Output 1500 ml  Net -1140 ml    Filed Weights   10/15/22 0500 10/17/22 0459 10/18/22 0415  Weight: 111 kg 114.5 kg 113 kg    General appearance: Awake alert.  In no distress Tracheostomy noted. Resp: Clear to auscultation bilaterally.  Normal effort Cardio: S1-S2 is normal regular.  No S3-S4.  No rubs murmurs or bruit GI: Abdomen is soft.  Nontender nondistended.  Bowel sounds are present normal.  No masses organomegaly.  PEG tube noted   Lab Results:  Data Reviewed: I have personally reviewed following labs and reports of the imaging studies  CBC: Recent Labs  Lab 10/14/22 0322 10/15/22 0235 10/16/22 0658 10/17/22 0336 10/18/22 0403  WBC 53.9* 61.9* 61.6* 54.6* 57.6*  NEUTROABS  --   --  7.4 7.6 3.1  HGB 13.0 12.5* 12.8* 12.3* 13.4  HCT 41.0 38.6* 39.1 39.5 42.7  MCV 94.0 90.6 91.6 93.2 92.8  PLT 255 245 263 264 253     Basic Metabolic Panel: Recent Labs  Lab 10/14/22 0322 10/15/22 0235 10/16/22 0658 10/17/22 0336 10/18/22 0403  NA 135 137 138 136 138  K 4.1 4.1 4.8 4.0 4.7  CL 102 105 107 104 108  CO2 24 23 20* 24 20*  GLUCOSE 116* 115* 130* 124* 120*  BUN 13 15 12 8 9   CREATININE 1.33* 1.78* 1.03 0.80 0.78  CALCIUM  9.4 9.6 9.3 9.1 9.4  MG 2.2  --   --   --   --      GFR: Estimated Creatinine Clearance: 116.4 mL/min (by C-G formula based on SCr  of 0.78 mg/dL).  CBG: Recent Labs  Lab 10/17/22 1607 10/17/22 2002 10/17/22 2322 10/18/22 0441 10/18/22 0801  GLUCAP 95 103* 107* 130* 126*      Recent Results (from the past 240 hour(s))  Expectorated Sputum Assessment w Gram Stain, Rflx to Resp Cult     Status: None   Collection Time: 10/11/22 10:58 AM   Specimen: Tracheal Aspirate; Sputum  Result Value Ref Range Status   Specimen Description EXPECTORATED SPUTUM  Final   Special Requests NONE  Final   Sputum evaluation   Final    THIS SPECIMEN IS ACCEPTABLE FOR SPUTUM CULTURE Performed at [facility], [place] [address] [place], [place] [address]    Report Status 10/11/2022 FINAL  Final  Culture, Respiratory w Gram Stain     Status: None   Collection Time: 10/11/22 10:58 AM  Result Value Ref Range Status   Specimen Description EXPECTORATED SPUTUM  Final   Special Requests NONE Reflexed from [id number]  Final   Gram Stain   Final    ABUNDANT WBC PRESENT, PREDOMINANTLY PMN MODERATE GRAM POSITIVE COCCI IN PAIRS IN CLUSTERS MODERATE GRAM POSITIVE RODS RARE GRAM NEGATIVE RODS    Culture   Final    MODERATE STAPHYLOCOCCUS AUREUS No Pseudomonas species isolated Performed at [facility], [address], [place],  [address]    Report Status 10/14/2022 FINAL  Final   Organism ID, Bacteria STAPHYLOCOCCUS AUREUS  Final      Susceptibility   Staphylococcus aureus - MIC*    CIPROFLOXACIN <=0.5 SENSITIVE Sensitive     ERYTHROMYCIN >=8 RESISTANT Resistant     GENTAMICIN <=0.5 SENSITIVE Sensitive     OXACILLIN 0.5 SENSITIVE Sensitive     TETRACYCLINE <=1 SENSITIVE Sensitive     VANCOMYCIN 1 SENSITIVE Sensitive     TRIMETH/SULFA <=10 SENSITIVE Sensitive     CLINDAMYCIN RESISTANT Resistant     RIFAMPIN <=0.5 SENSITIVE Sensitive     Inducible Clindamycin POSITIVE Resistant     * MODERATE STAPHYLOCOCCUS AUREUS      Radiology Studies: No results found.     LOS: 28 days   Edson Deridder [employer] on [email or web address]  10/18/2022, 10:26 AM

## 2022-10-18 NOTE — Progress Notes (Addendum)
Nutrition Follow-up  DOCUMENTATION CODES:   Obesity unspecified  INTERVENTION:  - Modify TF regimen to as-needed bolus feeds.  - Bolus 1 can of Jevity 1.5 if pt eats </= 50% of his meal.  - Continue FWF: 200 mL Q8H.   NUTRITION DIAGNOSIS:   Inadequate oral intake related to inability to eat as evidenced by NPO status. - Resolved, diet now advanced, PO 50-100%.   GOAL:   Patient will meet greater than or equal to 90% of their needs   MONITOR:   TF tolerance  REASON FOR ASSESSMENT:   Consult Enteral/tube feeding initiation and management  ASSESSMENT:   Pt with unknown PMH who is reportedly homeless admitted with ICH/R MCA ruptured aneurysm now s/p coiling and acute hypoxic respiratory failure PNA/ALI due to aspiration.  Meds include: pepcid, nutrisource fiber, sliding scale insulin, florastor. Labs reviewed.   Pt sleeping at time of assessment. Per record, pt ate 100% of his breakfast this am. Per record, the pt has eaten 50-100% of his meals over the past 7 days. Pt is also receiving nocturnal TF via PEG tube to meet 60% of his needs. Now that PO intakes have improved, RD will plan to switch over to as-needed bolus feeds. If pt eats 50% or less of his meal bolus 1 can of Jevity 1.5. RD will continue to monitor TF tolerance.   Diet Order:   Diet Order             DIET DYS 3 Room service appropriate? Yes with Assist; Fluid consistency: Thin  Diet effective now                   EDUCATION NEEDS:   No education needs have been identified at this time  Skin:  Skin Assessment: Skin Integrity Issues: Skin Integrity Issues:: Stage II, Other (Comment) Stage II: R leg (pretibial) - old healing ulcer Other: MARSI penis  Last BM:  10/17/22  Height:   Ht Readings from Last 1 Encounters:  09/18/22 5\' 7"  (1.702 m)    Weight:   Wt Readings from Last 1 Encounters:  10/18/22 113 kg    Ideal Body Weight:  67.3 kg  BMI:  Body mass index is 39.02  kg/m.  Estimated Nutritional Needs:   Kcal:  2000-2200  Protein:  100-120 grams  Fluid:  >2 L/day  10/20/22, RD, LDN, CNSC.

## 2022-10-18 NOTE — Plan of Care (Signed)
  Problem: Education: Goal: Knowledge of disease or condition will improve Outcome: Progressing   Problem: Activity: Goal: Ability to tolerate increased activity will improve Outcome: Progressing   Problem: Respiratory: Goal: Patent airway maintenance will improve Outcome: Progressing

## 2022-10-19 LAB — GLUCOSE, CAPILLARY
Glucose-Capillary: 104 mg/dL — ABNORMAL HIGH (ref 70–99)
Glucose-Capillary: 97 mg/dL (ref 70–99)
Glucose-Capillary: 115 mg/dL — ABNORMAL HIGH (ref 70–99)
Glucose-Capillary: 110 mg/dL — ABNORMAL HIGH (ref 70–99)
Glucose-Capillary: 111 mg/dL — ABNORMAL HIGH (ref 70–99)

## 2022-10-19 LAB — BASIC METABOLIC PANEL
Anion gap: 8 (ref 5–15)
BUN: 12 mg/dL (ref 8–23)
CO2: 26 mmol/L (ref 22–32)
Calcium: 9.5 mg/dL (ref 8.9–10.3)
Chloride: 105 mmol/L (ref 98–111)
Creatinine, Ser: 0.78 mg/dL (ref 0.61–1.24)
GFR, Estimated: >60 mL/min (ref >60)
Glucose, Bld: 110 mg/dL — ABNORMAL HIGH (ref 70–99)
Potassium: 4 mmol/L (ref 3.5–5.1)
Sodium: 139 mmol/L (ref 135–145)

## 2022-10-19 MED ORDER — INSULIN ASPART 100 UNIT/ML IJ SOLN
0.0000 [IU] | Freq: Three times a day (TID) | INTRAMUSCULAR | Status: DC
  Administered 2022-10-26: 3 [IU] via SUBCUTANEOUS
  Administered 2022-10-26: 2 [IU] via SUBCUTANEOUS
  Administered 2022-10-27: 3 [IU] via SUBCUTANEOUS
  Administered 2022-10-30 – 2022-11-01 (×4): 2 [IU] via SUBCUTANEOUS

## 2022-10-19 NOTE — Plan of Care (Signed)
  Problem: Metabolic: Goal: Ability to maintain appropriate glucose levels will improve Outcome: Progressing   Problem: Nutritional: Goal: Maintenance of adequate nutrition will improve Outcome: Progressing   Problem: Education: Goal: Knowledge of disease or condition will improve Outcome: Progressing   Problem: Intracerebral Hemorrhage Tissue Perfusion: Goal: Complications of Intracerebral Hemorrhage will be minimized Outcome: Progressing   Problem: Activity: Goal: Ability to tolerate increased activity will improve Outcome: Progressing   Problem: Health Behavior/Discharge Planning: Goal: Ability to manage tracheostomy will improve Outcome: Progressing   Problem: Respiratory: Goal: Patent airway maintenance will improve Outcome: Progressing

## 2022-10-19 NOTE — TOC Progression Note (Addendum)
Transition of Care [facility]) - Progression Note    Patient Details  Name: Dustin Jones MRN: [id number] Date of Birth: 16-Mar-1961  [facility] CM/SW Contact  Carley Hammed, [place] Phone Number: 10/19/2022, 4:07 PM  Clinical Narrative:     CSW followed up with dtr to see if anyone from DSS had reached out to discuss any Medicaid updates. She said she hasn't but would update CSW if she did. She is in agreement to follow up with DSS on pt's behalf. Dtr states the facility in [place] would be ideal, since she lives nearby. CSW to continue working with [facility] to try to have pt accepted.  [place] being notified of any available updates. CSW was advised, case had just been assigned to [employer] [phone]), CSW to follow up with case worker tomorrow. Pt does not have a SS# on file, this will need to be provided by family during next discussion. TOC will continue to follow for DC needs.       Expected Discharge Plan and Services                                                 Social Determinants of Health (SDOH) Interventions    Readmission Risk Interventions     No data to display

## 2022-10-19 NOTE — Progress Notes (Signed)
Occupational Therapy Treatment Patient Details Name: Dustin Jones MRN: [id number] DOB: [birth year] Today's Date: 10/19/2022   History of present illness The pt is a 61 y.o. unidentified homeless male presenting 9/28 after being found down by bystanders. 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 S/P coil embolization of R ICA aneurysm 9/28. Intubated 9/29. Trach placement 10/10.  Back to ICU 10/15 for respiratory failure, had IVC filter placed 10/26 and PEG.  No PMH on file.   OT comments  Pt progressing towards established OT goals. Pt motivated to participate in therapy. Rolling with min A for rolling fully onto side and requiring total A for bed-level pericare at beginning of session. Performing bed mobility with min A. Functional mobility to sink using EVA walker with mobility tech blocking LLE from crossing over RLE, and assist for balance/maintenance of LUE on walker. Performing grooming at sink with mod A. Facilitating weight bearing through LUE during grooming. Pt participating in LUE exercises of shoulder flexion and copmosite grasp; mod cues to fully extend fingers between attempts at grasp. Continue to recommend SNF for continued OT services at discharge.    Recommendations for follow up therapy are one component of a multi-disciplinary discharge planning process, led by the attending physician.  Recommendations may be updated based on patient status, additional functional criteria and insurance authorization.    Follow Up Recommendations  Skilled nursing-short term rehab (<3 hours/day)     Assistance Recommended at Discharge Frequent or constant Supervision/Assistance  Patient can return home with the following  Two people to help with walking and/or transfers;Two people to help with bathing/dressing/bathroom;Help with stairs or ramp for entrance;Assist for  transportation;Direct supervision/assist for financial management;Direct supervision/assist for medications management;Assistance with cooking/housework   Equipment Recommendations  Other (comment) (defer to next venue)    Recommendations for Other Services      Precautions / Restrictions Precautions Precautions: Fall Precaution Comments: trach, PEG Restrictions Weight Bearing Restrictions: No       Mobility Bed Mobility Overal bed mobility: Needs Assistance Bed Mobility: Rolling, Sidelying to Sit Rolling: Min assist Sidelying to sit: Min assist       General bed mobility comments: Min A for steadying following rise.    Transfers Overall transfer level: Needs assistance Equipment used: Bilateral platform walker (EVA walker) Transfers: Sit to/from Stand, Bed to chair/wheelchair/BSC Sit to Stand: Min assist, +2 physical assistance           General transfer comment: min assist to come to stand with EVA walker from EOB and reclienr with pt demonstrating good power up     Balance Overall balance assessment: Needs assistance Sitting-balance support: Feet supported Sitting balance-Leahy Scale: Fair Sitting balance - Comments: EOB with feet supported   Standing balance support: Bilateral upper extremity supported Standing balance-Leahy Scale: Poor Standing balance comment: Min A to maintain midline; mirror helpful                           ADL either performed or assessed with clinical judgement   ADL Overall ADL's : Needs assistance/impaired     Grooming: Moderate assistance;Standing;Wash/dry face;Oral care Grooming Details (indicate cue type and reason): Min A for balance when washing face with RUE. Facilitating weightbearing through LUE with L hand supporting self on sink. Mod-max A for oral care. pt required min A for static standing balance while requriing mod A to place toothpaste on  toothbrush due to L hand decreased dexterity. Actively turning off  faucet with LUE with min A due to decreased ROM affected by strength                 Toilet Transfer: Moderate assistance;+2 for physical assistance;+2 for safety/equipment;Ambulation (EVA) Toilet Transfer Details (indicate cue type and reason): Mod A due to blocking of LLE crossing over RLE, and for balance/maintaining LUE on EVA walker Toileting- Clothing Manipulation and Hygiene: Total assistance;+2 for safety/equipment;+2 for physical assistance;Bed level Toileting - Clothing Manipulation Details (indicate cue type and reason): soiled on arrival/needing to get off of bed pan     Functional mobility during ADLs: Moderate assistance;+2 for physical assistance;+2 for safety/equipment (EVA walker) General ADL Comments: ADL retraining with focus on attention to R side and use of LUE. Pt with decreased initiation and requiring cues for problem solving throughout.    Extremity/Trunk Assessment Upper Extremity Assessment Upper Extremity Assessment: LUE deficits/detail LUE Deficits / Details: 2+/5 shoulder flexion.   Lower Extremity Assessment Lower Extremity Assessment: Defer to PT evaluation        Vision       Perception     Praxis      Cognition Arousal/Alertness: Awake/alert Behavior During Therapy: Flat affect Overall Cognitive Status: Impaired/Different from baseline Area of Impairment: Attention, Following commands, Problem solving, Safety/judgement, Awareness                   Current Attention Level: Sustained   Following Commands: Follows one step commands with increased time, Follows one step commands inconsistently Safety/Judgement: Decreased awareness of deficits   Problem Solving: Slow processing, Decreased initiation, Requires verbal cues General Comments: Pt with verbalizations during session,  Pt following all one step commands with increased time and inconsistently following 2 step commands. Very motivated to participate in therapy.         Exercises Exercises: Other exercises General Exercises - Upper Extremity Shoulder Flexion: AROM, Right, Left, 5 reps, AAROM Shoulder Extension: AROM, AAROM, Right, Left, 5 reps Digit Composite Flexion: AROM, Left, 10 reps Composite Extension: AROM, Left, 10 reps Other Exercises Other Exercises: STSx3    Shoulder Instructions       General Comments      Pertinent Vitals/ Pain       Pain Assessment Pain Assessment: No/denies pain  Home Living                                          Prior Functioning/Environment              Frequency  Min 2X/week        Progress Toward Goals  OT Goals(current goals can now be found in the care plan section)  Progress towards OT goals: Progressing toward goals  Acute Rehab OT Goals Patient Stated Goal: none stated OT Goal Formulation: With patient Time For Goal Achievement: 10/21/22 Potential to Achieve Goals: Fair ADL Goals Pt Will Perform Grooming: sitting;with mod assist Pt Will Perform Upper Body Dressing: with mod assist;sitting Pt Will Perform Lower Body Dressing: with max assist;sitting/lateral leans Pt Will Transfer to Toilet: with mod assist;with +2 assist;bedside commode;stand pivot transfer Additional ADL Goal #1: Pt will maintain static sitting EOB with min guard A for 5 mins in preparation for ADL Additional ADL Goal #2: Pt will demonstrate increased attention to R side as indicated by visually tracking therapist toward R  4/4 attempts  Plan Discharge plan remains appropriate    Co-evaluation                 AM-PAC OT "6 Clicks" Daily Activity     Outcome Measure   Help from another person eating meals?: A Lot Help from another person taking care of personal grooming?: A Lot Help from another person toileting, which includes using toliet, bedpan, or urinal?: A Lot Help from another person bathing (including washing, rinsing, drying)?: A Lot Help from another person to put on and  taking off regular upper body clothing?: A Lot Help from another person to put on and taking off regular lower body clothing?: Total 6 Click Score: 11    End of Session Equipment Utilized During Treatment: Gait belt (EVA walker)  OT Visit Diagnosis: Muscle weakness (generalized) (M62.81);Other symptoms and signs involving the nervous system (R29.898);Other symptoms and signs involving cognitive function   Activity Tolerance Patient tolerated treatment well   Patient Left in chair;with call bell/phone within reach;with chair alarm set (Alarm belt)   Nurse Communication Mobility status;Other (comment) (In chair and with belt. Pt educated regaridng call for staff prior to belt removal.)        Time: 1150-1223 OT Time Calculation (min): 33 min  Charges: OT General Charges $OT Visit: 1 Visit OT Treatments $Self Care/Home Management : 23-37 mins  Dustin Jones, OTR/L [facility] Acute Rehabilitation Office: [phone]   Myrla Halsted 10/19/2022, 3:32 PM

## 2022-10-19 NOTE — Progress Notes (Signed)
[facility] PROGRESS NOTE   Dustin Jones UJW:[id number] DOB: January 03, 1961 DOA: 09/23/2022  PCP: Lavinia Sharps, NP  Brief History/Interval Summary: 61 year old with past medical history significant for hypertension, history of DVT admitted 9/28 as a code stroke, admitted to ICU with working diagnosis of intracranial hemorrhage right MCA ruptured aneurysm, has had prolonged hospitalization including multiple procedures, work-up including right ICA coil embolization of right ICA terminus aneurysm on Sep 23, 2022, tracheostomy by CCM on 10/10 and transferred to [facility] on 10/22.   9/28 admitted intracranial hemorrhage right MCA ruptured aneurysm status post CT head. 9/29: MRI brain large acute intraparenchymal hemorrhage extension into the right greater than left lateral ventricle, third ventricle and fourth ventricle. 9/29 developed fever and pneumonia, MSSA and respiratory culture white blood cell up to 56.  Treated. 10/15 transferred to the hospitalist service, transferred back to ICU for worsening respiratory status.  Was placed back on vent. 10/26; IVC filter and PEG tube placed by IR. 10/28: After conference call with family CODE STATUS was changed to DNR/DNI do not placed back on vent support. 11/01: Speech recommend Dysphagia 3 diet.  11/07 started Bactrim to cover for PNA.     Subjective/Interval History: No overnight events reported.  Patient is lying comfortably on the bed.  Denies any complaints.      Assessment/Plan:  Acute Hypoxic Respiratory Failure secondary to Pneumonia Pneumonia due to MSSA and Enterobacter Status post tracheostomy in place on trach collar Patient has completed multiple courses of antibiotics. Trach management per critical care medicine.  Last seen by pulmonology on 11/13.  They will consider downsizing him if he continues to improve. On 11/6 patient had low-grade fever.  Leukocytosis was noted from his baseline.  He was started on Bactrim to cover  panel resistant Enterobacter cloacae noted previously. Chest x-ray showed mild patchy opacities. Sputum cultures positive for Staph aureus.  Sensitive to Bactrim. Due to rising creatinine Bactrim was changed over to meropenem on 11/11.  Patient has completed course of antibiotics.   Respiratory status remains stable.  Acute kidney injury Creatinine peaked at 1.78.  Possibly due to back from an ACE inhibitor.  Both of these medications were held.  He was given IV fluids with the renal function back to baseline.   Urine output has picked up.  IV fluids have been discontinued.  Continue to hold ACE inhibitor.    Intracranial Hemorrhage right MCA rupture aneurysmal:  Status post R ICA coil embolization of right ICA terminus aneurysm on September 23, 2022 Remains quite distracted.  He does have left-sided hemiparesis. Neurological status is stable.  Dysphagia Status post PEG tube placed by IR 10/26 continue with tube feedings Speech therapy recommended dysphagia 3 diet 11/1. Patient has made significant progress with oral intake.  Now no longer on scheduled/continuous tube feedings.   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   Essential hypertension Patient currently on amlodipine, hydralazine and metoprolol.  Lisinopril had to be held due to elevated creatinine.  Occasional high blood pressure noted.  Dose of metoprolol was increased yesterday.  Continue to monitor for now.    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 Then it drifted down to stay between 35-40.  Increased with his respiratory infection.  Hypovolemia also contributed. WBC has improved but remains high.  Remains afebrile. He will need to be seen by hematology eventually.   Diabetes mellitus type 2 CBGs are reasonably  well controlled.  Continue SSI.  Fall on 11/13 No obvious injuries noted.  Patient has been firmly instructed to  call for help whenever he needs to use the bathroom.   Thrombocytopenia: Resolved Nonsustained V. tach: Continue with metoprolol.  Oral thrush: Diflucan 11/6 for 5 days.   Obesity Class 3  Estimated body mass index is 39.78 kg/m as calculated from the following:   Height as of this encounter: 5\' 7"  (1.702 m).   Weight as of this encounter: 115.2 kg.  DVT Prophylaxis: Subcutaneous heparin Code Status: DNR Family Communication: No family at bedside Disposition Plan: To be determined.  TOC is following.  Status is: Inpatient Remains inpatient appropriate because: Unsafe disposition      Medications: Scheduled:  amLODipine  10 mg Per Tube Daily   arformoterol  15 mcg Nebulization BID   famotidine  20 mg Per Tube Daily   feeding supplement (JEVITY 1.5 CAL/FIBER)  237 mL Per Tube TID PC   fiber  1 packet Per Tube BID   free water  200 mL Per Tube Q8H   heparin injection (subcutaneous)  5,000 Units Subcutaneous Q8H   hydrALAZINE  10 mg Per Tube BID   insulin aspart  0-15 Units Subcutaneous Q4H   metoprolol tartrate  50 mg Per Tube BID   mouth rinse  15 mL Mouth Rinse BID   revefenacin  175 mcg Nebulization Daily   saccharomyces boulardii  250 mg Per Tube BID   Continuous:    [id number] **OR** acetaminophen (TYLENOL) oral liquid 160 mg/5 mL **OR** acetaminophen, albuterol, fentaNYL (SUBLIMAZE) injection, mouth rinse, oxyCODONE  Antibiotics: Anti-infectives (From admission, onward)    Start     Dose/Rate Route Frequency Ordered Stop   10/16/22 0900  meropenem (MERREM) 1 g in sodium chloride 0.9 % 100 mL IVPB        1 g 200 mL/hr over 30 Minutes Intravenous Every 8 hours 10/16/22 0803 10/18/22 0700   10/15/22 1000  meropenem (MERREM) 1 g in sodium chloride 0.9 % 100 mL IVPB  Status:  Discontinued        1 g 200 mL/hr over 30 Minutes Intravenous Every 12 hours 10/15/22 0842 10/16/22 0803   10/11/22 1200  sulfamethoxazole-trimethoprim (BACTRIM DS) 800-160 MG per  tablet 1 tablet  Status:  Discontinued        1 tablet Oral Every 12 hours 10/11/22 1106 10/15/22 0842   10/10/22 1000  fluconazole (DIFLUCAN) 40 MG/ML suspension 100 mg        100 mg Per Tube Daily 10/10/22 0835 10/14/22 1003   10/05/22 1415  fluconazole (DIFLUCAN) 40 MG/ML suspension 100 mg  Status:  Discontinued        100 mg Oral Daily 10/05/22 1319 10/10/22 0835   09/29/22 1445  ceFAZolin (ANCEF) IVPB 2g/100 mL premix  Status:  Discontinued        2 g 200 mL/hr over 30 Minutes Intravenous  Once 09/29/22 1346 10/12/22 1148   09/29/22 1145  ceFAZolin (ANCEF) IVPB 1 g/50 mL premix  Status:  Discontinued        1 g 100 mL/hr over 30 Minutes Intravenous To Radiology 09/29/22 1057 09/29/22 1059   09/29/22 1145  ceFAZolin (ANCEF) IVPB 2g/100 mL premix        2 g 200 mL/hr over 30 Minutes Intravenous  Once 09/29/22 1059 09/29/22 1159   09/14/22 1400  ceFEPIme (MAXIPIME) 2 g in sodium chloride 0.9 % 100 mL IVPB  2 g 200 mL/hr over 30 Minutes Intravenous Every 8 hours 09/14/22 1312 09/21/22 0922   09/10/22 0830  ceFAZolin (ANCEF) IVPB 1 g/50 mL premix  Status:  Discontinued        1 g 100 mL/hr over 30 Minutes Intravenous Every 8 hours 09/10/22 0738 09/10/22 0821   09/10/22 0830  ceFAZolin (ANCEF) IVPB 2g/100 mL premix  Status:  Discontinued        2 g 200 mL/hr over 30 Minutes Intravenous Every 8 hours 09/10/22 0821 09/14/22 1312   09/07/22 0815  ceFAZolin (ANCEF) IVPB 2g/100 mL premix        2 g 200 mL/hr over 30 Minutes Intravenous Every 8 hours 09/07/22 0807 09/08/22 1742   09/04/22 2200  Ampicillin-Sulbactam (UNASYN) 3 g in sodium chloride 0.9 % 100 mL IVPB  Status:  Discontinued        3 g 200 mL/hr over 30 Minutes Intravenous Every 24 hours 09/04/22 0722 09/04/22 1506   09/04/22 2200  ceFAZolin (ANCEF) IVPB 1 g/50 mL premix  Status:  Discontinued        1 g 100 mL/hr over 30 Minutes Intravenous Every 12 hours 09/04/22 1506 09/07/22 0807   09/02/22 1030  Ampicillin-Sulbactam  (UNASYN) 3 g in sodium chloride 0.9 % 100 mL IVPB  Status:  Discontinued        3 g 200 mL/hr over 30 Minutes Intravenous Every 6 hours 09/02/22 1009 09/04/22 0722       Objective:  Vital Signs  Vitals:   10/19/22 0324 10/19/22 0437 10/19/22 0730 10/19/22 0823  BP: (!) 132/102   139/87  Pulse: 72  76 77  Resp: 17  16 18   Temp: 98.7 F (37.1 C)   99.2 F (37.3 C)  TempSrc: Oral   Oral  SpO2: 96%  97% 100%  Weight:  115.2 kg    Height:        Intake/Output Summary (Last 24 hours) at 10/19/2022 0924 Last data filed at 10/19/2022 0913 Gross per 24 hour  Intake 460 ml  Output --  Net 460 ml    Filed Weights   10/17/22 0459 10/18/22 0415 10/19/22 0437  Weight: 114.5 kg 113 kg 115.2 kg    General appearance: Awake alert.  In no distress Tracheostomy noted. Resp: Clear to auscultation bilaterally.  Normal effort Cardio: S1-S2 is normal regular.  No S3-S4.  No rubs murmurs or bruit GI: Abdomen is soft.  Nontender nondistended.  Bowel sounds are present normal.  No masses organomegaly.  PEG tube is noted.   Lab Results:  Data Reviewed: I have personally reviewed following labs and reports of the imaging studies  CBC: Recent Labs  Lab 10/14/22 0322 10/15/22 0235 10/16/22 0658 10/17/22 0336 10/18/22 0403  WBC 53.9* 61.9* 61.6* 54.6* 57.6*  NEUTROABS  --   --  7.4 7.6 3.1  HGB 13.0 12.5* 12.8* 12.3* 13.4  HCT 41.0 38.6* 39.1 39.5 42.7  MCV 94.0 90.6 91.6 93.2 92.8  PLT 255 245 263 264 253     Basic Metabolic Panel: Recent Labs  Lab 10/14/22 0322 10/15/22 0235 10/16/22 0658 10/17/22 0336 10/18/22 0403 10/19/22 0630  NA 135 137 138 136 138 139  K 4.1 4.1 4.8 4.0 4.7 4.0  CL 102 105 107 104 108 105  CO2 24 23 20* 24 20* 26  GLUCOSE 116* 115* 130* 124* 120* 110*  BUN 13 15 12 8 9 12   CREATININE 1.33* 1.78* 1.03 0.80 0.78 0.78  CALCIUM 9.4 9.6  9.3 9.1 9.4 9.5  MG 2.2  --   --   --   --   --      GFR: Estimated Creatinine Clearance: 117.5 mL/min  (by C-G formula based on SCr of 0.78 mg/dL).  CBG: Recent Labs  Lab 10/18/22 1157 10/18/22 1718 10/18/22 2009 10/19/22 0321 10/19/22 0819  GLUCAP 109* 101* 103* 104* 97      Recent Results (from the past 240 hour(s))  Expectorated Sputum Assessment w Gram Stain, Rflx to Resp Cult     Status: None   Collection Time: 10/11/22 10:58 AM   Specimen: Tracheal Aspirate; Sputum  Result Value Ref Range Status   Specimen Description EXPECTORATED SPUTUM  Final   Special Requests NONE  Final   Sputum evaluation   Final    THIS SPECIMEN IS ACCEPTABLE FOR SPUTUM CULTURE Performed at [facility], [place] [address], [place], [place] [address]    Report Status 10/11/2022 FINAL  Final  Culture, Respiratory w Gram Stain     Status: None   Collection Time: 10/11/22 10:58 AM  Result Value Ref Range Status   Specimen Description EXPECTORATED SPUTUM  Final   Special Requests NONE Reflexed from [id number]  Final   Gram Stain   Final    ABUNDANT WBC PRESENT, PREDOMINANTLY PMN MODERATE GRAM POSITIVE COCCI IN PAIRS IN CLUSTERS MODERATE GRAM POSITIVE RODS RARE GRAM NEGATIVE RODS    Culture   Final    MODERATE STAPHYLOCOCCUS AUREUS No Pseudomonas species isolated Performed at [facility], [address], [place], [place] [address]    Report Status 10/14/2022 FINAL  Final   Organism ID, Bacteria STAPHYLOCOCCUS AUREUS  Final      Susceptibility   Staphylococcus aureus - MIC*    CIPROFLOXACIN <=0.5 SENSITIVE Sensitive     ERYTHROMYCIN >=8 RESISTANT Resistant     GENTAMICIN <=0.5 SENSITIVE Sensitive     OXACILLIN 0.5 SENSITIVE Sensitive     TETRACYCLINE <=1 SENSITIVE Sensitive     VANCOMYCIN 1 SENSITIVE Sensitive     TRIMETH/SULFA <=10 SENSITIVE Sensitive     CLINDAMYCIN RESISTANT Resistant     RIFAMPIN <=0.5 SENSITIVE Sensitive     Inducible Clindamycin POSITIVE Resistant     * MODERATE STAPHYLOCOCCUS AUREUS      Radiology Studies: No results found.     LOS: 78 days   Tihanna Goodson  [employer] on [email or web address]  10/19/2022, 9:24 AM

## 2022-10-20 LAB — GLUCOSE, CAPILLARY
Glucose-Capillary: 104 mg/dL — ABNORMAL HIGH (ref 70–99)
Glucose-Capillary: 105 mg/dL — ABNORMAL HIGH (ref 70–99)
Glucose-Capillary: 109 mg/dL — ABNORMAL HIGH (ref 70–99)
Glucose-Capillary: 109 mg/dL — ABNORMAL HIGH (ref 70–99)
Glucose-Capillary: 114 mg/dL — ABNORMAL HIGH (ref 70–99)
Glucose-Capillary: 90 mg/dL (ref 70–99)

## 2022-10-20 LAB — BASIC METABOLIC PANEL
Anion gap: 10 (ref 5–15)
BUN: 15 mg/dL (ref 8–23)
CO2: 25 mmol/L (ref 22–32)
Calcium: 9.6 mg/dL (ref 8.9–10.3)
Chloride: 104 mmol/L (ref 98–111)
Creatinine, Ser: 0.91 mg/dL (ref 0.61–1.24)
GFR, Estimated: >60 mL/min (ref >60)
Glucose, Bld: 110 mg/dL — ABNORMAL HIGH (ref 70–99)
Potassium: 4.1 mmol/L (ref 3.5–5.1)
Sodium: 139 mmol/L (ref 135–145)

## 2022-10-20 NOTE — Plan of Care (Signed)
  Problem: Education: Goal: Knowledge of General Education information will improve Description: Including pain rating scale, medication(s)/side effects and non-pharmacologic comfort measures Outcome: Not Progressing   Problem: Health Behavior/Discharge Planning: Goal: Ability to manage health-related needs will improve Outcome: Not Progressing   

## 2022-10-20 NOTE — Progress Notes (Signed)
Physical Therapy Treatment Patient Details Name: TASHEEM CONDRON MRN: [id number] DOB: 02-26-1961 Today's Date: 10/20/2022   History of Present Illness The pt is a 61 y.o. unidentified homeless male presenting 9/28 after being found down by bystanders. 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 S/P coil embolization of R ICA aneurysm 9/28. Intubated 9/29. Trach placement 10/10.  Back to ICU 10/15 for respiratory failure, had IVC filter placed 10/26 and PEG.  No PMH on file.    PT Comments    Patient with flat affect, however follows commands and completes tasks as requested. Wearing PMSV throughout session, but often not answering direct questions (ex. Why did you stop walking? Was it your breathing or something else?). Able to progress ambulation distance up to 61 feet with EVA walker and +2 assist (to close follow with chair).     Recommendations for follow up therapy are one component of a multi-disciplinary discharge planning process, led by the attending physician.  Recommendations may be updated based on patient status, additional functional criteria and insurance authorization.  Follow Up Recommendations  Skilled nursing-short term rehab (<3 hours/day) Can patient physically be transported by private vehicle: No   Assistance Recommended at Discharge Frequent or constant Supervision/Assistance  Patient can return home with the following Two people to help with walking and/or transfers;Two people to help with bathing/dressing/bathroom;Assistance with cooking/housework;Assistance with feeding;Direct supervision/assist for medications management;Direct supervision/assist for financial management;Help with stairs or ramp for entrance;Assist for transportation   Equipment Recommendations  Other (comment) (TBD)    Recommendations for Other Services       Precautions /  Restrictions Precautions Precautions: Fall Precaution Comments: trach, PEG Restrictions Weight Bearing Restrictions: No     Mobility  Bed Mobility Overal bed mobility: Needs Assistance Bed Mobility: Rolling, Sidelying to Sit, Sit to Supine Rolling: Supervision Sidelying to sit: Min guard   Sit to supine: Min guard   General bed mobility comments: incr time, effort and use of rail to come to sit; nearly needed assist to lift LLE up onto bed, but barely able to clear leg onto mattress    Transfers Overall transfer level: Needs assistance Equipment used: Bilateral platform walker (EVA walker) Transfers: Sit to/from Stand Sit to Stand: Min assist           General transfer comment: min assist to come to stand with EVA walker from EOB and reclienr with pt demonstrating good power up    Ambulation/Gait Ambulation/Gait assistance: Mod assist, +2 safety/equipment Gait Distance (Feet): 31 Feet (seated rest, 30; 61) Assistive device: Bilateral platform walker (EVA walker) Gait Pattern/deviations: Step-through pattern, Decreased stride length, Narrow base of support, Step-to pattern Gait velocity: decr     General Gait Details: pt advancing LLE on his own and not crossing midline when advancing; some hyperextension of left knee noted, but not every step; required assist to control EVA walker while 2nd person followed with chair--some tendency to drift to the left   Stairs             Wheelchair Mobility    Modified Rankin (Stroke Patients Only) Modified Rankin (Stroke Patients Only) Pre-Morbid Rankin Score: No symptoms Modified Rankin: Moderately severe disability     Balance Overall balance assessment: Needs assistance Sitting-balance support: Feet supported Sitting balance-Leahy Scale: Fair Sitting balance - Comments: EOB with feet supported   Standing balance support: Bilateral upper extremity supported Standing balance-Leahy Scale: Poor Standing balance  comment:  Min A to maintain midline; mirror helpful                            Cognition Arousal/Alertness: Awake/alert Behavior During Therapy: Flat affect Overall Cognitive Status: Difficult to assess                         Following Commands: Follows one step commands with increased time     Problem Solving: Slow processing, Requires verbal cues          Exercises General Exercises - Lower Extremity Mini-Sqauts: AROM, Both, 10 reps (with EVA walker with emphasis on not hyperextending left knee as returns to upright) Other Exercises Other Exercises: STSx3 prior to ambulation Other Exercises: step forward and back with LLE while maintaining leg abducted (not crossing midline) x 5 reps    General Comments        Pertinent Vitals/Pain Pain Assessment Pain Assessment: No/denies pain    Home Living                          Prior Function            PT Goals (current goals can now be found in the care plan section) Acute Rehab PT Goals Patient Stated Goal: agrees to goal of walking more PT Goal Formulation: Patient unable to participate in goal setting Time For Goal Achievement: 11/03/22 Potential to Achieve Goals: Good Progress towards PT goals: Progressing toward goals (goals updated)    Frequency    Min 2X/week      PT Plan Current plan remains appropriate    Co-evaluation              AM-PAC PT "6 Clicks" Mobility   Outcome Measure  Help needed turning from your back to your side while in a flat bed without using bedrails?: A Little Help needed moving from lying on your back to sitting on the side of a flat bed without using bedrails?: A Little Help needed moving to and from a bed to a chair (including a wheelchair)?: A Lot Help needed standing up from a chair using your arms (e.g., wheelchair or bedside chair)?: A Lot Help needed to walk in hospital room?: A Lot Help needed climbing 3-5 steps with a railing? :  Total 6 Click Score: 13    End of Session Equipment Utilized During Treatment: Gait belt Activity Tolerance: Patient tolerated treatment well Patient left: with call bell/phone within reach;in bed;with nursing/sitter in room Nurse Communication: Mobility status PT Visit Diagnosis: Other abnormalities of gait and mobility (R26.89);Hemiplegia and hemiparesis;Other symptoms and signs involving the nervous system (R29.898);Muscle weakness (generalized) (M62.81) Hemiplegia - Right/Left: Left Hemiplegia - dominant/non-dominant: Non-dominant Hemiplegia - caused by: Nontraumatic intracerebral hemorrhage     Time: 8144-8185 PT Time Calculation (min) (ACUTE ONLY): 39 min  Charges:  $Gait Training: 38-52 mins                      Jerolyn Center, PT Acute Rehabilitation Services  Office [phone]    Zena Amos 10/20/2022, 3:26 PM

## 2022-10-20 NOTE — TOC Progression Note (Signed)
Transition of Care [facility]) - Progression Note    Patient Details  Name: GERAD CORNELIO MRN: [id number] Date of Birth: 29-Jan-1961  Transition of Care [facility]) CM/SW Contact  Carley Hammed, [place] Phone Number: 10/20/2022, 12:06 PM  Clinical Narrative:    CSW spoke with pt's DSS worker, Meade Maw, who confirmed she was assigned to pt's case, but it is new. Meade Maw requested updated clinicals, these were provided. CSW followed up with Tammy at [facility] who is still following, CSW to request additional information for [place]. TOC will continue to follow for DC needs.        Expected Discharge Plan and Services                                                 Social Determinants of Health (SDOH) Interventions    Readmission Risk Interventions     No data to display

## 2022-10-20 NOTE — Progress Notes (Signed)
[facility]                                                                              Dustin Jones, is a 61 y.o. male, DOB - 1961-10-17, [id number] Admit date - 09-21-2022    Outpatient Primary MD for the patient is [name], Chales Abrahams, NP  LOS - 49  days  Chief Complaint  Patient presents with   Code Stroke       Brief summary   61 year old with past medical history significant for hypertension, history of DVT admitted 9/28 as a code stroke, admitted to ICU with working diagnosis of intracranial hemorrhage right MCA ruptured aneurysm, has had prolonged hospitalization including multiple procedures, work-up including right ICA coil embolization of right ICA terminus aneurysm on September 21, 2022, tracheostomy by CCM on 10/10 and transferred to [facility] on 10/22.   9/28 admitted intracranial hemorrhage right MCA ruptured aneurysm status post CT head. 9/29: MRI brain large acute intraparenchymal hemorrhage extension into the right greater than left lateral ventricle, third ventricle and fourth ventricle. 9/29 developed fever and pneumonia, MSSA and respiratory culture white blood cell up to 56.  Treated. 10/15 transferred to the hospitalist service, transferred back to ICU for worsening respiratory status.  Was placed back on vent. 10/26; IVC filter and PEG tube placed by IR. 10/28: After conference call with family CODE STATUS was changed to DNR/DNI do not placed back on vent support. 11/01: Speech recommend Dysphagia 3 diet.  11/07 started Bactrim to cover for PNA.      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 -Trach management per CCM, continue routine trach care.  Last seen by CCM on 11/13, no plan for downsizing yet. -On 11/6, patient had low-grade fever, leukocytosis, sputum cultures positive for Staph aureus, was placed on Bactrim.  Due to rising creatinine, Bactrim was changed over to meropenem on 11/11, has completed course of antibiotics.   Acute kidney injury -Creatinine plateaued at 1.78 on 11/11, was placed on IV fluids, ACE inhibitor held -Creatinine has improved, back to baseline, 0.9 -Continue to hold lisinopril    Dysphagia Status post PEG tube placed by IR 10/26, was placed on tube feedings -Has made significant progress with oral intake, speech therapy recommended dysphagia 3 diet on 11/1, currently tolerating   Bilateral DVTs - Not a candidate for anticoagulation due to ICH - status post IVC filter in 09/29/2022 by IR.   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 -Now has remained between 50-60, will need to be seen by hematology outpatient   Diabetes mellitus type 2 -CBGs controlled,  continue SSI   Fall on 11/13 No obvious injuries noted.     Thrombocytopenia: Resolved Nonsustained V. tach: Continue with metoprolol.  Oral thrush: Diflucan 11/6 for 5 days.    Obesity Class 3  Estimated body mass index is 39.78 kg/m as calculated from the following:   Height as of this encounter: 5\' 7"  (1.702 m).   Weight as of this encounter: 115.2 kg.  Code Status: DNR DVT Prophylaxis:  heparin injection 5,000 Units Start: 09/03/22 1400 SCD's Start: 08/12/2022 0930   Level of Care: Level of care: Med-Surg Family Communication: Updated patient  Disposition Plan:      Remains inpatient appropriate: Difficult to  place  Procedures:   Consultants:   PCCM  Antimicrobials:   Anti-infectives (From admission, onward)    Start     Dose/Rate Route Frequency Ordered Stop   10/16/22 0900  meropenem (MERREM) 1 g in sodium chloride 0.9 % 100 mL IVPB        1 g 200 mL/hr over 30 Minutes Intravenous Every 8 hours 10/16/22 0803 10/18/22 0700   10/15/22 1000  meropenem (MERREM) 1 g in sodium chloride 0.9 % 100 mL IVPB  Status:  Discontinued        1 g 200 mL/hr over 30 Minutes Intravenous Every 12 hours 10/15/22 0842 10/16/22 0803   10/11/22 1200  sulfamethoxazole-trimethoprim (BACTRIM DS) 800-160 MG per tablet 1 tablet  Status:  Discontinued        1 tablet Oral Every 12 hours 10/11/22 1106 10/15/22 0842   10/10/22 1000  fluconazole (DIFLUCAN) 40 MG/ML suspension 100 mg        100 mg Per Tube Daily 10/10/22 0835 10/14/22 1003   10/05/22 1415  fluconazole (DIFLUCAN) 40 MG/ML suspension 100 mg  Status:  Discontinued        100 mg Oral Daily 10/05/22 1319 10/10/22 0835   09/29/22 1445  ceFAZolin (ANCEF) IVPB 2g/100 mL premix  Status:  Discontinued        2 g 200 mL/hr over 30 Minutes Intravenous  Once 09/29/22 1346 10/12/22 1148   09/29/22 1145  ceFAZolin (ANCEF) IVPB 1 g/50 mL premix  Status:  Discontinued        1 g 100 mL/hr over 30 Minutes Intravenous To Radiology 09/29/22 1057 09/29/22 1059   09/29/22 1145  ceFAZolin (ANCEF) IVPB 2g/100 mL premix        2 g 200 mL/hr over 30 Minutes Intravenous  Once 09/29/22 1059 09/29/22 1159   09/14/22 1400  ceFEPIme (MAXIPIME) 2 g in sodium chloride 0.9 % 100 mL IVPB        2 g 200 mL/hr over 30 Minutes Intravenous Every 8 hours 09/14/22 1312 09/21/22 0922   09/10/22 0830  ceFAZolin (ANCEF) IVPB 1 g/50 mL premix  Status:  Discontinued        1 g 100 mL/hr over 30 Minutes Intravenous Every 8 hours 09/10/22 0738 09/10/22 0821   09/10/22 0830  ceFAZolin (ANCEF) IVPB 2g/100 mL premix  Status:  Discontinued        2 g 200 mL/hr over 30 Minutes Intravenous Every 8  hours 09/10/22 0821 09/14/22 1312   09/07/22 0815  ceFAZolin (ANCEF) IVPB 2g/100 mL premix        2 g 200 mL/hr over 30 Minutes Intravenous Every 8 hours 09/07/22 0807 09/08/22 1742   09/04/22 2200  Ampicillin-Sulbactam (UNASYN) 3 g in sodium chloride 0.9 % 100 mL IVPB  Status:  Discontinued  3 g 200 mL/hr over 30 Minutes Intravenous Every 24 hours 09/04/22 0722 09/04/22 1506   09/04/22 2200  ceFAZolin (ANCEF) IVPB 1 g/50 mL premix  Status:  Discontinued        1 g 100 mL/hr over 30 Minutes Intravenous Every 12 hours 09/04/22 1506 09/07/22 0807   09/02/22 1030  Ampicillin-Sulbactam (UNASYN) 3 g in sodium chloride 0.9 % 100 mL IVPB  Status:  Discontinued        3 g 200 mL/hr over 30 Minutes Intravenous Every 6 hours 09/02/22 1009 09/04/22 0722          Medications  amLODipine  10 mg Per Tube Daily   arformoterol  15 mcg Nebulization BID   famotidine  20 mg Per Tube Daily   feeding supplement (JEVITY 1.5 CAL/FIBER)  237 mL Per Tube TID PC   fiber  1 packet Per Tube BID   free water  200 mL Per Tube Q8H   heparin injection (subcutaneous)  5,000 Units Subcutaneous Q8H   hydrALAZINE  10 mg Per Tube BID   insulin aspart  0-15 Units Subcutaneous TID WC   metoprolol tartrate  50 mg Per Tube BID   mouth rinse  15 mL Mouth Rinse BID   revefenacin  175 mcg Nebulization Daily   saccharomyces boulardii  250 mg Per Tube BID      Subjective:   Dustin Jones was seen and examined today.  No acute complaints.  Lying comfortably.    Objective:   Vitals:   10/20/22 0812 10/20/22 1140 10/20/22 1144 10/20/22 1150  BP:    118/85  Pulse: 66  74 68  Resp: 18 17 16 17   Temp:    98.1 F (36.7 C)  TempSrc:    Axillary  SpO2: 98%  96% 100%  Weight:      Height:        Intake/Output Summary (Last 24 hours) at 10/20/2022 1428 Last data filed at 10/20/2022 1200 Gross per 24 hour  Intake 1560 ml  Output 700 ml  Net 860 ml     Wt Readings from Last 3 Encounters:  10/20/22  115.2 kg     Exam General: Alert and oriented x 3, NAD, trach noted Cardiovascular: S1 S2 auscultated,  RRR Respiratory: Clear to auscultation bilaterally, no wheezing Gastrointestinal: Soft, nontender, nondistended, + bowel sounds Ext: no pedal edema bilaterally Neuro: no new FND's    Data Reviewed:  I have personally reviewed following labs    CBC Lab Results  Component Value Date   WBC 57.6 (HH) 10/18/2022   RBC 4.60 10/18/2022   HGB 13.4 10/18/2022   HCT 42.7 10/18/2022   MCV 92.8 10/18/2022   MCH 29.1 10/18/2022   PLT 253 10/18/2022   MCHC 31.4 10/18/2022   RDW 12.7 10/18/2022   LYMPHSABS 48.5 (H) 10/18/2022   MONOABS 5.3 (H) 10/18/2022   EOSABS 0.4 10/18/2022   BASOSABS 0.2 (H) [id number]     Last metabolic panel Lab Results  Component Value Date   NA 139 10/20/2022   K 4.1 10/20/2022   CL 104 10/20/2022   CO2 25 10/20/2022   BUN 15 10/20/2022   CREATININE 0.91 10/20/2022   GLUCOSE 110 (H) 10/20/2022   GFRNONAA >60 10/20/2022   CALCIUM 9.6 10/20/2022   PHOS 4.1 09/28/2022   PROT 7.5 09/29/2022   ALBUMIN 3.1 (L) 09/29/2022   BILITOT 0.6 09/29/2022   ALKPHOS 72 09/29/2022   AST 29 09/29/2022   ALT 40 09/29/2022  ANIONGAP 10 10/20/2022    CBG (last 3)  Recent Labs    10/20/22 0414 10/20/22 0737 10/20/22 1117  GLUCAP 109* 104* 109*       Radiology Studies: I have personally reviewed the imaging studies  No results found.     Estill Cotta M.D. [facility] 10/20/2022, 2:28 PM  Available via Epic secure chat 7am-7pm After 7 pm, please refer to night coverage provider listed on amion.

## 2022-10-20 NOTE — Progress Notes (Signed)
Speech Language Pathology Treatment: Dysphagia;Passy Muir Speaking valve  Patient Details Name: Dustin Jones MRN: [id number] DOB: [birth year] Today's Date: 10/20/2022 Time: 2585-2778 SLP Time Calculation (min) (ACUTE ONLY): 25 min  Assessment / Plan / Recommendation Clinical Impression  Pt seen at bedside for skilled ST intervention targeting increased communicative effectiveness, PMSV care and use, and adherence to safe swallow strategies. No family was present. Pt continues to exhibit flat affect and poor eye contact. His speech is intelligible for the most part, however, the low volume with which he speaks decreases clarity and necessitates repetition. Pt had PMSV in place upon arrival of SLP, and stated it had been on "all day". RN reported PMSV was placed around 8 this morning. Will begin educating pt on donning and doffing the valve.   Pt was observed during lunch to assess diet tolerance. Written on whiteboard was that pt requires feeding. Pt does quite well feeding himself with set-up.  Pt had chicken, mac n cheese, and green beans with lemonade to drink. Pt appeared to chew effectively, and had minimal residue after the swallow. No overt s/s aspiration observed. Will continue current diet. Safe swallow precautions posted at [facility].  SLP will continue per plan of care.   HPI HPI: Dustin Jones is a 61 y.o. male who is homeless, presenting 08/16/2022 after being found down by bystanders. 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 S/P coil embolization of R ICA aneurysm 9/28. Intubated 9/29. Trach placement 10/10. Cortrak 10/11; G-tube 10/26.  No PMH on file. MBS 10/05/22 D3/thin, crushed meds      SLP Plan  Continue with current plan of care      Recommendations for follow up therapy are one component of a multi-disciplinary discharge planning process, led by the attending  physician.  Recommendations may be updated based on patient status, additional functional criteria and insurance authorization.    Recommendations  Diet recommendations: Dysphagia 3 (mechanical soft);Thin liquid Liquids provided via: Cup;Straw Medication Administration: Crushed with puree Supervision: Staff to assist with self feeding;Patient able to self feed;Full supervision/cueing for compensatory strategies Compensations: Slow rate;Small sips/bites;Minimize environmental distractions;Follow solids with liquid Postural Changes and/or Swallow Maneuvers: Seated upright 90 degrees      Patient may use Passy-Muir Speech Valve: During all waking hours (remove during sleep);During PO intake/meals PMSV Supervision: Intermittent MD: Please consider changing trach tube to : Smaller size;Cuffless         Oral Care Recommendations: Oral care QID Follow Up Recommendations: Skilled nursing-short term rehab (<3 hours/day) Assistance recommended at discharge: Frequent or constant Supervision/Assistance SLP Visit Diagnosis: Dysphagia, oral phase (R13.11);Aphonia (R49.1) Plan: Continue with current plan of care          Kandra Graven B. Quentin Ore, [facility], [place] Speech Language Pathologist Office: [phone]  Shonna Chock 10/20/2022, 12:32 PM

## 2022-10-20 NOTE — NC FL2 (Signed)
[place] LEVEL OF CARE SCREENING TOOL     IDENTIFICATION  Patient Name: Dustin Jones Birthdate: 13-May-1961 Sex: male Admission Date (Current Location): 08/16/2022  [facility] and [place] Number:  Herbalist and Address:  [facility] [place], [place] [address] [place], [place] [address]      Provider Number: [id number]  Attending Physician Name and Address:  Mendel Corning, MD  Relative Name and Phone Number:  Bunnie Philips  [phone]    Current Level of Care: Hospital Recommended Level of Care: [place] Prior Approval Number:    Date Approved/Denied:   PASRR Number: [id number]  Discharge Plan: SNF    Current Diagnoses: Patient Active Problem List   Diagnosis Date Noted   Ventilator associated pneumonia ([place]) 09/15/2022   Leukocytosis 09/15/2022   Fever 09/15/2022   Hypernatremia    Tracheostomy dependence (HCC)    Acute respiratory failure with hypoxia (HCC)    Aspiration pneumonia of both lungs (HCC)    ICH (intracerebral hemorrhage) ([place]) 08/31/2022   Intracranial hemorrhage ([place]) 08/20/2022   Pressure injury of skin 08/21/2022    Orientation RESPIRATION BLADDER Height & Weight     Self, Place  Tracheostomy (5L 21%) Incontinent, External catheter Weight: 253 lb 15.5 oz (115.2 kg) Height:  5\' 7"  (170.2 cm)  BEHAVIORAL SYMPTOMS/MOOD NEUROLOGICAL BOWEL NUTRITION STATUS      Incontinent (Gastrostomy) Diet, Feeding tube (PEG, See DC summary)  AMBULATORY STATUS COMMUNICATION OF NEEDS Skin   Extensive Assist Verbally PU Stage and Appropriate Care (R pretibial distal Pressure injury)                       Personal Care Assistance Level of Assistance  Bathing, Feeding, Dressing Bathing Assistance: Maximum assistance Feeding assistance: Maximum assistance Dressing Assistance: Maximum assistance     Functional Limitations Info  Sight, Hearing, Speech Sight Info: Adequate Hearing Info: Adequate Speech  Info: Adequate    SPECIAL CARE FACTORS FREQUENCY  PT (By licensed PT), OT (By licensed OT)     PT Frequency: 5x week OT Frequency: 5x week            Contractures Contractures Info: Not present    Additional Factors Info  Code Status, Allergies Code Status Info: DNR Allergies Info: NKA           Current Medications (10/20/2022):  This is the current hospital active medication list Current Facility-Administered Medications  Medication Dose Route Frequency Provider Last Rate Last Admin   acetaminophen (TYLENOL) tablet 650 mg  650 mg Oral Q4H PRN Rigoberto Noel, MD   650 mg at 09/27/22 2038   Or   acetaminophen (TYLENOL) 160 MG/5ML solution 650 mg  650 mg Per Tube Q4H PRN Rigoberto Noel, MD   650 mg at 10/20/22 0855   Or   acetaminophen (TYLENOL) suppository 650 mg  650 mg Rectal Q4H PRN Rigoberto Noel, MD   650 mg at 08/13/2022 2033   albuterol (PROVENTIL) (2.5 MG/3ML) 0.083% nebulizer solution 2.5 mg  2.5 mg Nebulization Q4H PRN Hongalgi, Everlene Farrier D, MD       amLODipine (NORVASC) tablet 10 mg  10 mg Per Tube Daily Rigoberto Noel, MD   10 mg at 10/20/22 0849   arformoterol (BROVANA) nebulizer solution 15 mcg  15 mcg Nebulization BID Rigoberto Noel, MD   15 mcg at 10/20/22 0815   famotidine (PEPCID) tablet 20 mg  20 mg Per Tube Daily Regalado,  Belkys A, MD   20 mg at 10/20/22 0849   feeding supplement (JEVITY 1.5 CAL/FIBER) liquid 237 mL  237 mL Per Tube TID PC Osvaldo Shipper, MD   237 mL at 10/20/22 1211   fentaNYL (SUBLIMAZE) injection 12.5-50 mcg  12.5-50 mcg Intravenous Q2H PRN Oretha Milch, MD       fiber (NUTRISOURCE FIBER) 1 packet  1 packet Per Tube BID Oretha Milch, MD   1 packet at 10/20/22 0849   free water 200 mL  200 mL Per Tube Q8H Osvaldo Shipper, MD   200 mL at 10/20/22 1212   heparin injection 5,000 Units  5,000 Units Subcutaneous Q8H Oretha Milch, MD   5,000 Units at 10/20/22 1211   hydrALAZINE (APRESOLINE) tablet 10 mg  10 mg Per Tube BID Regalado, Belkys  A, MD   10 mg at 10/20/22 0850   insulin aspart (novoLOG) injection 0-15 Units  0-15 Units Subcutaneous TID WC Osvaldo Shipper, MD       metoprolol tartrate (LOPRESSOR) tablet 50 mg  50 mg Per Tube BID Osvaldo Shipper, MD   50 mg at 10/20/22 0849   Oral care mouth rinse  15 mL Mouth Rinse PRN Oretha Milch, MD   15 mL at 10/05/22 1412   Oral care mouth rinse  15 mL Mouth Rinse BID Regalado, Belkys A, MD   15 mL at 10/20/22 0850   oxyCODONE (ROXICODONE) 5 MG/5ML solution 5 mg  5 mg Per Tube BID PRN Oretha Milch, MD   5 mg at 10/10/22 2117   revefenacin (YUPELRI) nebulizer solution 175 mcg  175 mcg Nebulization Daily Oretha Milch, MD   175 mcg at 10/20/22 0815   saccharomyces boulardii (FLORASTOR) capsule 250 mg  250 mg Per Tube BID Regalado, Belkys A, MD   250 mg at 10/20/22 0849     Discharge Medications: Please see discharge summary for a list of discharge medications.  Relevant Imaging Results:  Relevant Lab Results:   Additional Information SS# [id number]  Carley Hammed, LCSWA

## 2022-10-20 NOTE — Procedures (Signed)
Tracheostomy Change Note  Patient Details:   Name: MALIKE FOGLIO DOB: [birth year] MRN: [id number]    Airway Documentation:     Evaluation  O2 sats: stable throughout Complications: No apparent complications Patient did tolerate procedure well. Bilateral Breath Sounds: Clear, Diminished  Pt Tracheostomy tube was changed to a #6 Shiley flex cuffless with no apparent complications. Good color change on CO2 detector and bilateral breath sounds were auscultated. Pt vital signs are stable at this time on 5L 21% ATC. RT will continue to monitor as needed  Jolayne Panther 10/20/2022, 3:28 PM

## 2022-10-21 LAB — CBC WITH DIFFERENTIAL/PLATELET
Abs Immature Granulocytes: 0 10*3/uL (ref 0.00–0.07)
Basophils Absolute: 0 10*3/uL (ref 0.0–0.1)
Basophils Relative: 0 %
Eosinophils Absolute: 0 10*3/uL (ref 0.0–0.5)
Eosinophils Relative: 0 %
HCT: 40.4 % (ref 39.0–52.0)
Hemoglobin: 13.1 g/dL (ref 13.0–17.0)
Lymphocytes Relative: 84 %
Lymphs Abs: 44.3 10*3/uL — ABNORMAL HIGH (ref 0.7–4.0)
MCH: 29.8 pg (ref 26.0–34.0)
MCHC: 32.4 g/dL (ref 30.0–36.0)
MCV: 91.8 fL (ref 80.0–100.0)
Monocytes Absolute: 2.6 10*3/uL — ABNORMAL HIGH (ref 0.1–1.0)
Monocytes Relative: 5 %
Neutro Abs: 5.8 10*3/uL (ref 1.7–7.7)
Neutrophils Relative %: 11 %
Platelets: 213 10*3/uL (ref 150–400)
RBC: 4.4 MIL/uL (ref 4.22–5.81)
RDW: 12.9 % (ref 11.5–15.5)
WBC: 52.7 10*3/uL (ref 4.0–10.5)
nRBC: 0 % (ref 0.0–0.2)
nRBC: 0 /100 WBC

## 2022-10-21 LAB — GLUCOSE, CAPILLARY
Glucose-Capillary: 106 mg/dL — ABNORMAL HIGH (ref 70–99)
Glucose-Capillary: 100 mg/dL — ABNORMAL HIGH (ref 70–99)
Glucose-Capillary: 109 mg/dL — ABNORMAL HIGH (ref 70–99)
Glucose-Capillary: 120 mg/dL — ABNORMAL HIGH (ref 70–99)
Glucose-Capillary: 109 mg/dL — ABNORMAL HIGH (ref 70–99)

## 2022-10-21 NOTE — Progress Notes (Signed)
Occupational Therapy Treatment Patient Details Name: Dustin Jones MRN: [id number] DOB: [birth year] Today's Date: 10/21/2022   History of present illness The pt is a 61 y.o. unidentified homeless male presenting 9/28 after being found down by bystanders. 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 S/P coil embolization of R ICA aneurysm 9/28. Intubated 9/29. Trach placement 10/10.  Back to ICU 10/15 for respiratory failure, had IVC filter placed 10/26 and PEG.  No PMH on file.   OT comments  Pt in bed and soiled with urine on arrival. Mod A to don new gown; performing LB bathing of thighs and perineal area with supervision. Functional mobility to sink with EVA walker with mod A+2 for balance and to decrease tendency for feet crossing over one another during gait. Performing 2 grooming tasks at sink with up to mod A and one seated rest break; supporting self with LUE on EVA walker during standing grooming tasks as well. Performing functional mobility in hall with cues for gait pattern and two seated rest breaks. Performing LUE exercises to increase strength and ROM. Max cues for all UE exercise. Will continue to follow acutely.    Recommendations for follow up therapy are one component of a multi-disciplinary discharge planning process, led by the attending physician.  Recommendations may be updated based on patient status, additional functional criteria and insurance authorization.    Follow Up Recommendations  Skilled nursing-short term rehab (<3 hours/day)     Assistance Recommended at Discharge Frequent or constant Supervision/Assistance  Patient can return home with the following  Two people to help with walking and/or transfers;Two people to help with bathing/dressing/bathroom;Help with stairs or ramp for entrance;Assist for transportation;Direct supervision/assist for financial  management;Direct supervision/assist for medications management;Assistance with cooking/housework   Equipment Recommendations  Other (comment) (defer to next venue)    Recommendations for Other Services      Precautions / Restrictions Precautions Precautions: Fall Precaution Comments: trach, PEG Restrictions Weight Bearing Restrictions: No       Mobility Bed Mobility Overal bed mobility: Needs Assistance Bed Mobility: Rolling, Sidelying to Sit Rolling: Supervision Sidelying to sit: Min guard       General bed mobility comments: incr time, effort and use of rail to come to sit    Transfers Overall transfer level: Needs assistance Equipment used: Bilateral platform walker (EVA walker) Transfers: Sit to/from Stand Sit to Stand: Min assist           General transfer comment: min assist to come to stand with EVA walker from EOB and reclienr with pt demonstrating good power up     Balance Overall balance assessment: Needs assistance Sitting-balance support: Feet supported Sitting balance-Leahy Scale: Fair Sitting balance - Comments: EOB with feet supported   Standing balance support: Bilateral upper extremity supported Standing balance-Leahy Scale: Poor Standing balance comment: Min A for static standing                           ADL either performed or assessed with clinical judgement   ADL Overall ADL's : Needs assistance/impaired     Grooming: Moderate assistance;Standing;Wash/dry face;Oral care Grooming Details (indicate cue type and reason): Min A for staatic standing balance; observed with mild L knee buckling but maintaining balance and able to self correct (blocking for safety). Weight bearing through LUE on eva walker for all tasks. Pt with greater problem solving this date  to apply toothbrush. Light mod A with therapist providing min pressure to get toothpaste out.     Lower Body Bathing: Sitting/lateral leans;Minimal assistance Lower Body  Bathing Details (indicate cue type and reason): Min A to wash thighs sitting EOB as pt soiled in urine on arrival         Toilet Transfer: Moderate assistance;+2 for physical assistance;+2 for safety/equipment;Ambulation (EVA walker) Toilet Transfer Details (indicate cue type and reason): Mod A due to blocking of LLE crossing over RLE, and for balance/maintaining LUE on EVA walker         Functional mobility during ADLs: Moderate assistance;+2 for physical assistance;+2 for safety/equipment (EVA walker) General ADL Comments: ADL retraining with focus on attention to R side and use of LUE. Pt with greater initiation of ADL this session    Extremity/Trunk Assessment              Vision       Perception     Praxis      Cognition Arousal/Alertness: Awake/alert Behavior During Therapy: Flat affect Overall Cognitive Status: Difficult to assess Area of Impairment: Attention, Following commands, Problem solving, Safety/judgement, Awareness                   Current Attention Level: Sustained   Following Commands: Follows one step commands with increased time Safety/Judgement: Decreased awareness of deficits Awareness:  (cont decresaed awareness of R side of environment) Problem Solving: Slow processing, Requires verbal cues General Comments: Following all one step commands with incresaed time, but continues to require step by step cues during all UE exercises. Able to sequence basic ADL. Some problem solving skills demonstrated during today's session when experiencing poor dexterity and using handle of walker for leverage        Exercises Exercises: Other exercises Other Exercises Other Exercises: STSx3 Other Exercises: LUE composite flexion/extension 10x; pt required up to max cues for technique/body mechanics and following commands Other Exercises: LUE elbow flexion gravity eliminated 10x; mod-max cues to perform correct amount of repeititions and continue  moving Other Exercises: AAROM LUE shoulder flexion 10x. Mod-max cues for sequencing Other Exercises: propelling recliner with therapist guiding from hall back to room    Shoulder Instructions       General Comments      Pertinent Vitals/ Pain       Pain Assessment Pain Assessment: No/denies pain Pain Intervention(s): Monitored during session  Home Living Family/patient expects to be discharged to:: Shelter/Homeless                                        Prior Functioning/Environment              Frequency  Min 2X/week        Progress Toward Goals  OT Goals(current goals can now be found in the care plan section)  Progress towards OT goals: Progressing toward goals  Acute Rehab OT Goals Patient Stated Goal: none stated OT Goal Formulation: With patient Time For Goal Achievement: 11/04/22 Potential to Achieve Goals: Fair ADL Goals Pt Will Perform Grooming: with min assist;standing Pt Will Perform Upper Body Dressing: with min assist;sitting Pt Will Perform Lower Body Dressing: with mod assist;sit to/from stand Pt Will Transfer to Toilet: ambulating;bedside commode;with min assist Pt/caregiver will Perform Home Exercise Program: Left upper extremity;With minimal assist;With written HEP provided;Increased strength;Increased ROM Additional ADL Goal #1: Pt will complete 3  consecutive grooming tasks standing at sink without seated rest break Additional ADL Goal #2: Pt will demonstrate upright posture without lateral lean during functional activity and be able to self correct with 1-2 cues  Plan Discharge plan remains appropriate    Co-evaluation                 AM-PAC OT "6 Clicks" Daily Activity     Outcome Measure   Help from another person eating meals?: A Lot Help from another person taking care of personal grooming?: A Lot Help from another person toileting, which includes using toliet, bedpan, or urinal?: A Lot Help from another  person bathing (including washing, rinsing, drying)?: A Lot Help from another person to put on and taking off regular upper body clothing?: A Lot Help from another person to put on and taking off regular lower body clothing?: Total 6 Click Score: 11    End of Session Equipment Utilized During Treatment: Gait belt (EVA walker)  OT Visit Diagnosis: Muscle weakness (generalized) (M62.81);Other symptoms and signs involving the nervous system (R29.898);Other symptoms and signs involving cognitive function   Activity Tolerance Patient tolerated treatment well   Patient Left in chair;with call bell/phone within reach;with chair alarm set (alarm belt)   Nurse Communication Mobility status;Other (comment) (In chair and with belt. Pt educated regaridng call for staff prior to belt removal.)        Time: 1520-1610 OT Time Calculation (min): 50 min  Charges: OT General Charges $OT Visit: 1 Visit OT Treatments $Self Care/Home Management : 23-37 mins $Therapeutic Exercise: 8-22 mins  Ladene Artist, OTR/L [facility] Acute Rehabilitation Office: [phone]   Myrla Halsted 10/21/2022, 5:52 PM

## 2022-10-21 NOTE — Progress Notes (Signed)
Mobility Specialist: Progress Note   10/21/22 1619  Mobility  Activity Ambulated with assistance in hallway  Level of Assistance +2 (takes two people)  Assistive Device Four wheel walker (EVA)  Distance Ambulated (ft) 70 ft (40'+30')  Activity Response Tolerated well  Mobility Referral Yes  $Mobility charge 1 Mobility   Pt received in the bed urinary incontinent; assisted with pericare and then agreeable to mobility. Completed ADLs with OT at the sink and then hallway ambulation. Cues for wider BOS and upright posture throughout. Stopped x2 for seated rest secondary to fatigue and wheeled back to the room after session. OT present in the room.   Madeeha Costantino Mobility Specialist Please contact via SecureChat or Rehab office at [phone]

## 2022-10-21 NOTE — Progress Notes (Signed)
[facility]                                                                              Dustin Jones, is a 61 y.o. male, DOB - 12/19/1960, [id number] Admit date - 08/31/2022    Outpatient Primary MD for the patient is Jones, Dustin Muscat, NP  LOS - 52  days  Chief Complaint  Patient presents with   Code Stroke       Brief summary   61 year old with past medical history significant for hypertension, history of DVT admitted 9/28 as a code stroke, admitted to ICU with working diagnosis of intracranial hemorrhage right MCA ruptured aneurysm, has had prolonged hospitalization including multiple procedures, work-up including right ICA coil embolization of right ICA terminus aneurysm on 08/30/2022, tracheostomy by CCM on 10/10 and transferred to [facility] on 10/22.   9/28 admitted intracranial hemorrhage right MCA ruptured aneurysm status post CT head. 9/29: MRI brain large acute intraparenchymal hemorrhage extension into the right greater than left lateral ventricle, third ventricle and fourth ventricle. 9/29 developed fever and pneumonia, MSSA and respiratory culture white blood cell up to 56.  Treated. 10/15 transferred to the hospitalist service, transferred back to ICU for worsening respiratory status.  Was placed back on vent. 10/26; IVC filter and PEG tube placed by IR. 10/28: After conference call with family CODE STATUS was changed to DNR/DNI do not placed back on vent support. 11/01: Speech recommend Dysphagia 3 diet.  11/07 started Bactrim to cover for PNA.  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 -Trach management per CCM, continue routine trach care.  Last seen by CCM on 11/13, no plan for downsizing yet. -On 11/6, patient had low-grade fever, leukocytosis, sputum cultures positive for Staph aureus, was placed on Bactrim.  Due to rising creatinine, Bactrim was changed over to meropenem on 11/11, has completed course of antibiotics. -Tracheostomy tube changed to #6 Shiley flex cuffless on 11/16   Acute kidney injury -Creatinine plateaued at 1.78 on 11/11, was placed on IV fluids, ACE inhibitor held -Creatinine has improved, back to baseline, 0.9 -Continue to hold lisinopril    Dysphagia Status post PEG tube placed by IR 10/26, was placed on tube feedings -Has made significant progress with oral intake, speech therapy recommended dysphagia 3 diet on 11/1 -tolerating diet    Bilateral DVTs - Not a candidate for anticoagulation due to [place] - status post IVC filter in 09/29/2022 by IR.   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 -Now has remained between 50-60, will need to be seen by hematology outpatient   Diabetes mellitus type 2 -CBGs controlled, continue SSI   Fall on 11/13 No obvious injuries noted.     Thrombocytopenia: Resolved Nonsustained V. tach: Continue with metoprolol.  Oral thrush: Diflucan 11/6 for 5 days.    Obesity Class 3  Estimated body mass index is 39.98 kg/m as calculated from the following:   Height as of this encounter: 5\' 7"  (1.702 m).   Weight as of this encounter: 115.8 kg.  Code Status: DNR DVT Prophylaxis:  heparin injection 5,000 Units Start: 09/03/22 1400 SCD's Start: 09/27/2022 0930   Level of Care: Level  of care: Med-Surg Family Communication: Updated patient Disposition Plan:      Remains inpatient appropriate: Difficult to place  Procedures:   Consultants:   PCCM  Antimicrobials:   Anti-infectives (From admission, onward)    Start     Dose/Rate Route Frequency Ordered Stop   10/16/22 0900  meropenem (MERREM) 1 g in sodium chloride 0.9 % 100 mL IVPB        1 g 200 mL/hr over 30 Minutes Intravenous Every 8 hours 10/16/22 0803 10/18/22 0700   10/15/22 1000  meropenem (MERREM) 1 g in sodium chloride 0.9 % 100 mL IVPB  Status:  Discontinued        1 g 200 mL/hr over 30 Minutes Intravenous Every 12 hours 10/15/22 0842 10/16/22 0803   10/11/22 1200  sulfamethoxazole-trimethoprim (BACTRIM DS) 800-160 MG per tablet 1 tablet  Status:  Discontinued        1 tablet Oral Every 12 hours 10/11/22 1106 10/15/22 0842   10/10/22 1000  fluconazole (DIFLUCAN) 40 MG/ML suspension 100 mg        100 mg Per Tube Daily 10/10/22 0835 10/14/22 1003   10/05/22 1415  fluconazole (DIFLUCAN) 40 MG/ML suspension 100 mg  Status:  Discontinued        100 mg Oral Daily 10/05/22 1319 10/10/22 0835   09/29/22 1445  ceFAZolin (ANCEF) IVPB 2g/100 mL premix  Status:  Discontinued        2 g 200 mL/hr over 30 Minutes Intravenous  Once 09/29/22 1346 10/12/22 1148   09/29/22 1145  ceFAZolin (ANCEF) IVPB 1 g/50 mL premix  Status:  Discontinued        1 g 100 mL/hr over 30 Minutes Intravenous To Radiology 09/29/22 1057 09/29/22 1059   09/29/22 1145  ceFAZolin (ANCEF) IVPB 2g/100 mL premix        2 g 200 mL/hr over 30 Minutes Intravenous  Once 09/29/22 1059 09/29/22 1159   09/14/22 1400  ceFEPIme (MAXIPIME) 2 g in sodium chloride 0.9 % 100 mL IVPB        2 g 200 mL/hr over 30 Minutes Intravenous Every 8 hours 09/14/22 1312 09/21/22 0922   09/10/22 0830  ceFAZolin (ANCEF) IVPB 1 g/50 mL premix  Status:  Discontinued        1 g 100 mL/hr over 30 Minutes Intravenous Every 8 hours 09/10/22 0738 09/10/22 0821   09/10/22  0830  ceFAZolin (ANCEF) IVPB 2g/100 mL premix  Status:  Discontinued        2 g 200 mL/hr over 30 Minutes Intravenous Every 8 hours 09/10/22 0821 09/14/22 1312   09/07/22 0815  ceFAZolin (ANCEF) IVPB 2g/100 mL premix        2 g 200 mL/hr over 30 Minutes Intravenous Every 8 hours 09/07/22 0807 09/08/22 1742   09/04/22 2200  Ampicillin-Sulbactam (UNASYN) 3 g in sodium chloride 0.9 % 100 mL IVPB  Status:  Discontinued        3 g 200 mL/hr over 30 Minutes Intravenous Every 24 hours 09/04/22 0722 09/04/22 1506   09/04/22 2200  ceFAZolin (ANCEF) IVPB 1 g/50 mL premix  Status:  Discontinued        1 g 100 mL/hr over 30 Minutes Intravenous Every 12 hours 09/04/22 1506 09/07/22 0807   09/02/22 1030  Ampicillin-Sulbactam (UNASYN) 3 g in sodium chloride 0.9 % 100 mL IVPB  Status:  Discontinued        3 g 200 mL/hr over 30 Minutes Intravenous Every 6 hours 09/02/22 1009 09/04/22 0722          Medications  amLODipine  10 mg Per Tube Daily   arformoterol  15 mcg Nebulization BID   famotidine  20 mg Per Tube Daily   feeding supplement (JEVITY 1.5 CAL/FIBER)  237 mL Per Tube TID PC   fiber  1 packet Per Tube BID   free water  200 mL Per Tube Q8H   heparin injection (subcutaneous)  5,000 Units Subcutaneous Q8H   hydrALAZINE  10 mg Per Tube BID   insulin aspart  0-15 Units Subcutaneous TID WC   metoprolol tartrate  50 mg Per Tube BID   mouth rinse  15 mL Mouth Rinse BID   revefenacin  175 mcg Nebulization Daily   saccharomyces boulardii  250 mg Per Tube BID      Subjective:   Berl Ronnald Ramp was seen and examined today.  No acute complaints. Objective:   Vitals:   10/21/22 0807 10/21/22 0810 10/21/22 1113 10/21/22 1153  BP: (!) 141/86   121/74  Pulse: 77 69 84 82  Resp: 18 18 16 18   Temp: 97.6 F (36.4 C)   98.8 F (37.1 C)  TempSrc: Oral   Oral  SpO2: 98% 91% 91% 96%  Weight:      Height:        Intake/Output Summary (Last 24 hours) at 10/21/2022 1350 Last data filed at  10/21/2022 [id number] Gross per 24 hour  Intake --  Output 600 ml  Net -600 ml     Wt Readings from Last 3 Encounters:  10/21/22 115.8 kg    Physical Exam General: Alert and oriented x 3, NAD, trach Cardiovascular: S1 S2 clear, RRR.  Respiratory: CTAB Gastrointestinal: Soft, nontender, nondistended, NBS, PEG tube+ Ext: no pedal edema bilaterally Neuro: no new deficits   Data Reviewed:  I have personally reviewed following labs    CBC Lab Results  Component Value Date   WBC 52.7 (HH) 10/21/2022   RBC 4.40 10/21/2022   HGB 13.1 10/21/2022   HCT 40.4 10/21/2022   MCV 91.8 10/21/2022   MCH 29.8 10/21/2022   PLT 213 10/21/2022   MCHC 32.4 10/21/2022   RDW 12.9 10/21/2022   LYMPHSABS 44.3 (H) 10/21/2022   MONOABS 2.6 (H) 10/21/2022   EOSABS 0.0 10/21/2022   BASOSABS 0.0 [id number]     Last metabolic panel Lab Results  Component Value Date   NA 139 10/20/2022   K 4.1 10/20/2022   CL 104 10/20/2022   CO2 25 10/20/2022   BUN 15 10/20/2022   CREATININE 0.91 10/20/2022   GLUCOSE 110 (H) 10/20/2022   GFRNONAA >60 10/20/2022   CALCIUM 9.6 10/20/2022   PHOS 4.1 09/28/2022   PROT 7.5 09/29/2022   ALBUMIN 3.1 (L) 09/29/2022   BILITOT 0.6 09/29/2022   ALKPHOS 72 09/29/2022  AST 29 09/29/2022   ALT 40 09/29/2022   ANIONGAP 10 10/20/2022    CBG (last 3)  Recent Labs    10/21/22 0342 10/21/22 0802 10/21/22 1152  GLUCAP 106* 100* 109*       Radiology Studies: I have personally reviewed the imaging studies  No results found.     Estill Cotta M.D. [facility] 10/21/2022, 1:50 PM  Available via Epic secure chat 7am-7pm After 7 pm, please refer to night coverage provider listed on amion.

## 2022-10-22 LAB — CBC WITH DIFFERENTIAL/PLATELET
Abs Immature Granulocytes: 0 10*3/uL (ref 0.00–0.07)
Basophils Absolute: 0 10*3/uL (ref 0.0–0.1)
Basophils Relative: 0 %
Eosinophils Absolute: 0.5 10*3/uL (ref 0.0–0.5)
Eosinophils Relative: 1 %
HCT: 39 % (ref 39.0–52.0)
Hemoglobin: 12.7 g/dL — ABNORMAL LOW (ref 13.0–17.0)
Lymphocytes Relative: 87 %
Lymphs Abs: 47.2 10*3/uL — ABNORMAL HIGH (ref 0.7–4.0)
MCH: 29.7 pg (ref 26.0–34.0)
MCHC: 32.6 g/dL (ref 30.0–36.0)
MCV: 91.3 fL (ref 80.0–100.0)
Monocytes Absolute: 2.7 10*3/uL — ABNORMAL HIGH (ref 0.1–1.0)
Monocytes Relative: 5 %
Neutro Abs: 3.8 10*3/uL (ref 1.7–7.7)
Neutrophils Relative %: 7 %
Platelets: 202 10*3/uL (ref 150–400)
RBC: 4.27 MIL/uL (ref 4.22–5.81)
RDW: 12.8 % (ref 11.5–15.5)
WBC: 54.3 10*3/uL (ref 4.0–10.5)
nRBC: 0 % (ref 0.0–0.2)

## 2022-10-22 LAB — GLUCOSE, CAPILLARY
Glucose-Capillary: 119 mg/dL — ABNORMAL HIGH (ref 70–99)
Glucose-Capillary: 97 mg/dL (ref 70–99)
Glucose-Capillary: 109 mg/dL — ABNORMAL HIGH (ref 70–99)
Glucose-Capillary: 123 mg/dL — ABNORMAL HIGH (ref 70–99)

## 2022-10-22 LAB — BASIC METABOLIC PANEL
Anion gap: 13 (ref 5–15)
BUN: 14 mg/dL (ref 8–23)
CO2: 23 mmol/L (ref 22–32)
Calcium: 9.6 mg/dL (ref 8.9–10.3)
Chloride: 105 mmol/L (ref 98–111)
Creatinine, Ser: 0.83 mg/dL (ref 0.61–1.24)
GFR, Estimated: >60 mL/min (ref >60)
Glucose, Bld: 107 mg/dL — ABNORMAL HIGH (ref 70–99)
Potassium: 3.8 mmol/L (ref 3.5–5.1)
Sodium: 141 mmol/L (ref 135–145)

## 2022-10-22 NOTE — Progress Notes (Signed)
Dr. Arville Care notified of the bedside monitor not working. No new order received.

## 2022-10-22 NOTE — Progress Notes (Signed)
02 monitoring machine seems to be malfunction, and reading upper 70s, and patient is not in any distress. The Dinamap reads 98 to 100% each time staff check his pulse-ox. The machine keeps on beeping the whole night. The machine is changed and it's still doing the same thing.  RT notified and she checked behind me with no success. Will spot check his 02 level till we figure out what's going on with the machine.

## 2022-10-22 NOTE — Progress Notes (Signed)
The continuous monitor is working efficiently right now after multiple changes of the machine and the probe. Patient is impulsive this morning, and trying to get out of bed again. Will ask MD for a sitter for him.

## 2022-10-22 NOTE — Progress Notes (Addendum)
I was called to room 11 by the NT to help situate a patient. When I was going, the patient was lying in bed and coming, I noted him leaning on his right leg in front of his bed. He indicated he was not hurt.  ROM is at his baseline. Patient stated he was going to the bathroom. He's been asking for bed pan the whole night and he passes gas each time. He had a smear one time. Patient was assisted to the bed by multiple staff. Dr. Arville Care notified without any new order.  V/signs are stable. The bed alarm was malfunction, and it didn't alarm. The cord cannot fit together.  Maintenance called and they're working on it right now. Called patient's daughter Kaelyn Nauta) and left a message to call back for update.  Will continue to monitor.

## 2022-10-22 NOTE — Progress Notes (Addendum)
[facility]                                                                              Ko Fladger, is a 61 y.o. male, DOB - [birth year], [id number] Admit date - 08/17/2022    Outpatient Primary MD for the patient is [name], [name], NP  LOS - 83  days  Chief Complaint  Patient presents with   Code Stroke       Brief summary   61 year old with past medical history significant for hypertension, history of DVT admitted 9/28 as a code stroke, admitted to ICU with working diagnosis of intracranial hemorrhage right MCA ruptured aneurysm, has had prolonged hospitalization including multiple procedures, work-up including right ICA coil embolization of right ICA terminus aneurysm on 08/31/2022, tracheostomy by CCM on 10/10 and transferred to [facility] on 10/22.   9/28 admitted intracranial hemorrhage right MCA ruptured aneurysm status post CT head. 9/29: MRI brain large acute intraparenchymal hemorrhage extension into the right greater than left lateral ventricle, third ventricle and fourth ventricle. 9/29 developed fever and pneumonia, MSSA and respiratory culture white blood cell up to 56.  Treated. 10/15 transferred to the hospitalist service, transferred back to ICU for worsening respiratory status.  Was placed back on vent. 10/26; IVC filter and PEG tube placed by IR. 10/28: After conference call with family CODE STATUS was changed to DNR/DNI do not placed back on vent support. 11/01: Speech recommend Dysphagia 3 diet.  11/07 started Bactrim to cover for PNA.  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 -Trach management per CCM, continue routine trach care.  Last seen by CCM on 11/13, no plan for downsizing yet. -On 11/6, patient had low-grade fever, leukocytosis, sputum cultures positive for Staph aureus, was placed on Bactrim.  Due to rising creatinine, Bactrim was changed over to meropenem on 11/11, has completed course of antibiotics. -Tracheostomy tube changed to #6 Shiley flex cuffless on 11/16 -No acute issues  Acute kidney injury -Creatinine plateaued at 1.78 on 11/11, was placed on IV fluids, ACE inhibitor held -Creatinine has improved, back to baseline, 0.9 -Continue to hold lisinopril    Dysphagia Status post PEG tube placed by IR 10/26, was placed on tube feedings -Has made significant progress with oral intake, speech therapy recommended dysphagia 3 diet on 11/1 -tolerating diet    Bilateral DVTs - Not a candidate for anticoagulation due to [place] - status post IVC filter in 09/29/2022 by IR.   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 -Now has remained between 50-60, will need to be seen by hematology outpatient   Diabetes mellitus type 2 -CBGs controlled, continue SSI   Fall on 11/13, again on 11/18 No obvious injuries noted.     Thrombocytopenia: Resolved Nonsustained V. tach: Continue with metoprolol.  Oral thrush: Diflucan 11/6 for 5 days.    Obesity Class 3  Estimated body mass index is 39.98 kg/m as calculated from the following:   Height as of this encounter: 5\' 7"  (1.702 m).   Weight as of this encounter: 115.8 kg.  Code Status: DNR DVT Prophylaxis:  heparin injection 5,000 Units Start: 09/03/22 1400 SCD's Start: 08/18/2022  0930   Level of Care: Level of care: Med-Surg Family Communication: Updated patient Disposition Plan:      Remains inpatient appropriate: Difficult to place   Procedures:   Consultants:   PCCM  Antimicrobials:   Anti-infectives (From admission, onward)    Start     Dose/Rate Route Frequency Ordered Stop   10/16/22 0900  meropenem (MERREM) 1 g in sodium chloride 0.9 % 100 mL IVPB        1 g 200 mL/hr over 30 Minutes Intravenous Every 8 hours 10/16/22 0803 10/18/22 0700   10/15/22 1000  meropenem (MERREM) 1 g in sodium chloride 0.9 % 100 mL IVPB  Status:  Discontinued        1 g 200 mL/hr over 30 Minutes Intravenous Every 12 hours 10/15/22 0842 10/16/22 0803   10/11/22 1200  sulfamethoxazole-trimethoprim (BACTRIM DS) 800-160 MG per tablet 1 tablet  Status:  Discontinued        1 tablet Oral Every 12 hours 10/11/22 1106 10/15/22 0842   10/10/22 1000  fluconazole (DIFLUCAN) 40 MG/ML suspension 100 mg        100 mg Per Tube Daily 10/10/22 0835 10/14/22 1003   10/05/22 1415  fluconazole (DIFLUCAN) 40 MG/ML suspension 100 mg  Status:  Discontinued        100 mg Oral Daily 10/05/22 1319 10/10/22 0835   09/29/22 1445  ceFAZolin (ANCEF) IVPB 2g/100 mL premix  Status:  Discontinued        2 g 200 mL/hr over 30 Minutes Intravenous  Once 09/29/22 1346 10/12/22 1148   09/29/22 1145  ceFAZolin (ANCEF) IVPB 1 g/50 mL premix  Status:  Discontinued        1 g 100 mL/hr over 30 Minutes Intravenous To Radiology 09/29/22 1057 09/29/22 1059   09/29/22 1145  ceFAZolin (ANCEF) IVPB 2g/100 mL premix        2 g 200 mL/hr over 30 Minutes Intravenous  Once 09/29/22 1059 09/29/22 1159   09/14/22 1400  ceFEPIme (MAXIPIME) 2 g in sodium chloride 0.9 % 100 mL IVPB        2 g 200 mL/hr over 30 Minutes Intravenous Every 8 hours 09/14/22 1312 09/21/22 0922   09/10/22 0830  ceFAZolin (ANCEF) IVPB 1 g/50 mL premix  Status:  Discontinued        1 g 100 mL/hr over 30 Minutes Intravenous Every 8 hours 09/10/22  0738 09/10/22 0821   09/10/22 0830  ceFAZolin (ANCEF) IVPB 2g/100 mL premix  Status:  Discontinued        2 g 200 mL/hr over 30 Minutes Intravenous Every 8 hours 09/10/22 0821 09/14/22 1312   09/07/22 0815  ceFAZolin (ANCEF) IVPB 2g/100 mL premix        2 g 200 mL/hr over 30 Minutes Intravenous Every 8 hours 09/07/22 0807 09/08/22  1742   09/04/22 2200  Ampicillin-Sulbactam (UNASYN) 3 g in sodium chloride 0.9 % 100 mL IVPB  Status:  Discontinued        3 g 200 mL/hr over 30 Minutes Intravenous Every 24 hours 09/04/22 0722 09/04/22 1506   09/04/22 2200  ceFAZolin (ANCEF) IVPB 1 g/50 mL premix  Status:  Discontinued        1 g 100 mL/hr over 30 Minutes Intravenous Every 12 hours 09/04/22 1506 09/07/22 0807   09/02/22 1030  Ampicillin-Sulbactam (UNASYN) 3 g in sodium chloride 0.9 % 100 mL IVPB  Status:  Discontinued        3 g 200 mL/hr over 30 Minutes Intravenous Every 6 hours 09/02/22 1009 09/04/22 0722          Medications  amLODipine  10 mg Per Tube Daily   arformoterol  15 mcg Nebulization BID   famotidine  20 mg Per Tube Daily   feeding supplement (JEVITY 1.5 CAL/FIBER)  237 mL Per Tube TID PC   fiber  1 packet Per Tube BID   free water  200 mL Per Tube Q8H   heparin injection (subcutaneous)  5,000 Units Subcutaneous Q8H   hydrALAZINE  10 mg Per Tube BID   insulin aspart  0-15 Units Subcutaneous TID WC   metoprolol tartrate  50 mg Per Tube BID   mouth rinse  15 mL Mouth Rinse BID   revefenacin  175 mcg Nebulization Daily   saccharomyces boulardii  250 mg Per Tube BID      Subjective:   Minor Yetta Barre was seen and examined today.  Overnight issues noted, had a fall.  Patient had bed alarm however was malfunctioned.  No acute issues this morning.  No nausea vomiting diarrhea or abdominal pain, fevers.   Objective:   Vitals:   10/22/22 0603 10/22/22 0813 10/22/22 0818 10/22/22 0820  BP: (!) 115/93 (!) 149/97    Pulse: 69 75  75  Resp: 19 20  20   Temp: 98.4 F (36.9  C) 98.9 F (37.2 C)    TempSrc: Oral Oral    SpO2: 99% 100% 100% 100%  Weight:      Height:        Intake/Output Summary (Last 24 hours) at 10/22/2022 1109 Last data filed at 10/22/2022 10/24/2022 Gross per 24 hour  Intake --  Output 800 ml  Net -800 ml     Wt Readings from Last 3 Encounters:  10/21/22 115.8 kg   Physical Exam General: Alert and awake, in ED, trach Cardiovascular: S1 S2 clear, RRR.  Respiratory: CTAB, no wheezing, rales or rhonchi Gastrointestinal: Soft, nontender, nondistended, NBS, PEG tube Ext: no pedal edema bilaterally Neuro: no new deficits Psych: flat affect   Data Reviewed:  I have personally reviewed following labs    CBC Lab Results  Component Value Date   WBC 54.3 (HH) 10/22/2022   RBC 4.27 10/22/2022   HGB 12.7 (L) 10/22/2022   HCT 39.0 10/22/2022   MCV 91.3 10/22/2022   MCH 29.7 10/22/2022   PLT 202 10/22/2022   MCHC 32.6 10/22/2022   RDW 12.8 10/22/2022   LYMPHSABS 47.2 (H) 10/22/2022   MONOABS 2.7 (H) 10/22/2022   EOSABS 0.5 10/22/2022   BASOSABS 0.0 10/22/2022     Last metabolic panel Lab Results  Component Value Date   NA 141 10/22/2022   K 3.8 10/22/2022   CL 105 10/22/2022   CO2 23 10/22/2022   BUN 14 10/22/2022   CREATININE 0.83 10/22/2022  GLUCOSE 107 (H) 10/22/2022   GFRNONAA >60 10/22/2022   CALCIUM 9.6 10/22/2022   PHOS 4.1 09/28/2022   PROT 7.5 09/29/2022   ALBUMIN 3.1 (L) 09/29/2022   BILITOT 0.6 09/29/2022   ALKPHOS 72 09/29/2022   AST 29 09/29/2022   ALT 40 09/29/2022   ANIONGAP 13 10/22/2022    CBG (last 3)  Recent Labs    10/21/22 1641 10/21/22 2120 10/22/22 0632  GLUCAP 120* 109* 119*       Radiology Studies: I have personally reviewed the imaging studies  No results found.     Estill Cotta M.D. [facility] 10/22/2022, 11:09 AM  Available via Epic secure chat 7am-7pm After 7 pm, please refer to night coverage provider listed on amion.

## 2022-10-23 LAB — CBC WITH DIFFERENTIAL/PLATELET
Abs Immature Granulocytes: 0 10*3/uL (ref 0.00–0.07)
Basophils Absolute: 0 10*3/uL (ref 0.0–0.1)
Basophils Relative: 0 %
Eosinophils Absolute: 0.5 10*3/uL (ref 0.0–0.5)
Eosinophils Relative: 1 %
HCT: 40.2 % (ref 39.0–52.0)
Hemoglobin: 13.2 g/dL (ref 13.0–17.0)
Lymphocytes Relative: 89 %
Lymphs Abs: 47.8 10*3/uL — ABNORMAL HIGH (ref 0.7–4.0)
MCH: 30 pg (ref 26.0–34.0)
MCHC: 32.8 g/dL (ref 30.0–36.0)
MCV: 91.4 fL (ref 80.0–100.0)
Monocytes Absolute: 2.7 10*3/uL — ABNORMAL HIGH (ref 0.1–1.0)
Monocytes Relative: 5 %
Neutro Abs: 2.7 10*3/uL (ref 1.7–7.7)
Neutrophils Relative %: 5 %
Platelets: 196 10*3/uL (ref 150–400)
RBC: 4.4 MIL/uL (ref 4.22–5.81)
RDW: 12.7 % (ref 11.5–15.5)
WBC: 53.7 10*3/uL (ref 4.0–10.5)
nRBC: 0 % (ref 0.0–0.2)

## 2022-10-23 LAB — GLUCOSE, CAPILLARY
Glucose-Capillary: 101 mg/dL — ABNORMAL HIGH (ref 70–99)
Glucose-Capillary: 99 mg/dL (ref 70–99)
Glucose-Capillary: 110 mg/dL — ABNORMAL HIGH (ref 70–99)
Glucose-Capillary: 145 mg/dL — ABNORMAL HIGH (ref 70–99)

## 2022-10-23 NOTE — Progress Notes (Signed)
Found patient with misplaced trach, trach collar secure and tight. Last time trach care performed was this am at 0800 per RT. SaO2 stable at 90%, resp even/regular, unlabored. Patient eating breakfast. Removed food, placed stat call to RT. RT unable to reinsert tracheostomy, applying sterile dressing over trach due to stable status. Dr Andres Labrum notified. Stat consult placed to Dr Tonia Brooms who is arriving for patient assessment.

## 2022-10-23 NOTE — Progress Notes (Addendum)
[facility]                                                                              Dustin Jones, is a 61 y.o. male, DOB - 10-23-1961, [id number] Admit date - 08/25/2022    Outpatient Primary MD for the patient is [name], [name], NP  LOS - 46  days  Chief Complaint  Patient presents with   Code Stroke       Brief summary   61 year old with past medical history significant for hypertension, history of DVT admitted 9/28 as a code stroke, admitted to ICU with working diagnosis of intracranial hemorrhage right MCA ruptured aneurysm, has had prolonged hospitalization including multiple procedures, work-up including right ICA coil embolization of right ICA terminus aneurysm on 08/26/2022, tracheostomy by CCM on 10/10 and transferred to [facility] on 10/22.   9/28 admitted intracranial hemorrhage right MCA ruptured aneurysm status post CT head. 9/29: MRI brain large acute intraparenchymal hemorrhage extension into the right greater than left lateral ventricle, third ventricle and fourth ventricle. 9/29 developed fever and pneumonia, MSSA and respiratory culture white blood cell up to 56.  Treated. 10/15 transferred to the hospitalist service, transferred back to ICU for worsening respiratory status.  Was placed back on vent. 10/26; IVC filter and PEG tube placed by IR. 10/28: After conference call with family CODE STATUS was changed to DNR/DNI do not placed back on vent support. 11/01: Speech recommend Dysphagia 3 diet.  11/07 started Bactrim to cover for PNA.  11/16: Tracheostomy tube was changed to a #6 Shiley flex cuffless  11/19: trach removed by the patient, assessed by CCM, Dr. 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 -Trach management per CCM, continue routine trach care.  Last seen by CCM on 11/13, no plan for downsizing yet. -On 11/6, patient had low-grade fever, leukocytosis, sputum cultures positive for Staph aureus, was placed on Bactrim.  Due to rising creatinine, Bactrim was changed over to meropenem on 11/11, has completed course of antibiotics. -Tracheostomy tube changed to #6 Shiley flex cuffless on 11/16 -Patient inadvertently removed trach today, evaluated by CCM, Dr Valeta Harms.  On exam, able to phonate, cough and clear his throat, recommended to observe at this time and may not need to have the trach placed back.  Acute kidney injury -Creatinine plateaued at 1.78 on 11/11, was placed on IV fluids, ACE inhibitor held -Creatinine has improved, back to baseline, 0.9 -Continue to hold lisinopril    Dysphagia Status post PEG tube placed by IR 10/26, was placed on tube feedings -Has made significant progress with oral intake, speech therapy recommended dysphagia  3 diet on 11/1 -tolerating diet    Bilateral DVTs - Not a candidate for anticoagulation due to [place] - status post IVC filter in 09/29/2022 by IR.   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 -Now has remained between 50-60, will need to be seen by hematology outpatient, trending down   Diabetes mellitus type 2 -CBGs controlled, continue SSI   Fall on 11/13, again on 11/18 No obvious injuries noted.     Thrombocytopenia: Resolved Nonsustained V. tach:  Continue with metoprolol.  Oral thrush: Diflucan 11/6 for 5 days.    Obesity Class 3  Estimated body mass index is 39.98 kg/m as calculated from the following:   Height as of this encounter: 5\' 7"  (1.702 m).   Weight as of this encounter: 115.8 kg.  Code Status: DNR DVT Prophylaxis:  heparin injection 5,000 Units Start: 09/03/22 1400 SCD's Start: 08/08/2022 0930   Level of Care: Level of care: Med-Surg Family Communication: Updated patient Disposition Plan:      Remains inpatient appropriate: Difficult to place   Procedures:   Consultants:   PCCM  Antimicrobials:   Anti-infectives (From admission, onward)    Start     Dose/Rate Route Frequency Ordered Stop   10/16/22 0900  meropenem (MERREM) 1 g in sodium chloride 0.9 % 100 mL IVPB        1 g 200 mL/hr over 30 Minutes Intravenous Every 8 hours 10/16/22 0803 10/18/22 0700   10/15/22 1000  meropenem (MERREM) 1 g in sodium chloride 0.9 % 100 mL IVPB  Status:  Discontinued        1 g 200 mL/hr over 30 Minutes Intravenous Every 12 hours 10/15/22 0842 10/16/22 0803   10/11/22 1200  sulfamethoxazole-trimethoprim (BACTRIM DS) 800-160 MG per tablet 1 tablet  Status:  Discontinued        1 tablet Oral Every 12 hours 10/11/22 1106 10/15/22 0842   10/10/22 1000  fluconazole (DIFLUCAN) 40 MG/ML suspension 100 mg        100 mg Per Tube Daily 10/10/22 0835 10/14/22 1003   10/05/22 1415  fluconazole (DIFLUCAN) 40 MG/ML suspension 100 mg  Status:  Discontinued        100 mg Oral Daily 10/05/22 1319 10/10/22 0835   09/29/22 1445  ceFAZolin (ANCEF) IVPB 2g/100 mL premix  Status:  Discontinued        2 g 200 mL/hr over 30 Minutes Intravenous  Once 09/29/22 1346 10/12/22 1148   09/29/22 1145  ceFAZolin (ANCEF) IVPB 1 g/50 mL premix  Status:  Discontinued        1 g 100 mL/hr over 30 Minutes Intravenous To Radiology 09/29/22 1057 09/29/22 1059   09/29/22 1145  ceFAZolin (ANCEF) IVPB 2g/100 mL premix        2 g 200 mL/hr over 30 Minutes  Intravenous  Once 09/29/22 1059 09/29/22 1159   09/14/22 1400  ceFEPIme (MAXIPIME) 2 g in sodium chloride 0.9 % 100 mL IVPB        2 g 200 mL/hr over 30 Minutes Intravenous Every 8 hours 09/14/22 1312 09/21/22 0922   09/10/22 0830  ceFAZolin (ANCEF) IVPB 1 g/50 mL premix  Status:  Discontinued        1 g 100 mL/hr over 30 Minutes Intravenous Every 8 hours 09/10/22 0738 09/10/22 0821   09/10/22 0830  ceFAZolin (ANCEF) IVPB  2g/100 mL premix  Status:  Discontinued        2 g 200 mL/hr over 30 Minutes Intravenous Every 8 hours 09/10/22 0821 09/14/22 1312   09/07/22 0815  ceFAZolin (ANCEF) IVPB 2g/100 mL premix        2 g 200 mL/hr over 30 Minutes Intravenous Every 8 hours 09/07/22 0807 09/08/22 1742   09/04/22 2200  Ampicillin-Sulbactam (UNASYN) 3 g in sodium chloride 0.9 % 100 mL IVPB  Status:  Discontinued        3 g 200 mL/hr over 30 Minutes Intravenous Every 24 hours 09/04/22 0722 09/04/22 1506   09/04/22 2200  ceFAZolin (ANCEF) IVPB 1 g/50 mL premix  Status:  Discontinued        1 g 100 mL/hr over 30 Minutes Intravenous Every 12 hours 09/04/22 1506 09/07/22 0807   09/02/22 1030  Ampicillin-Sulbactam (UNASYN) 3 g in sodium chloride 0.9 % 100 mL IVPB  Status:  Discontinued        3 g 200 mL/hr over 30 Minutes Intravenous Every 6 hours 09/02/22 1009 09/04/22 0722          Medications  amLODipine  10 mg Per Tube Daily   arformoterol  15 mcg Nebulization BID   famotidine  20 mg Per Tube Daily   feeding supplement (JEVITY 1.5 CAL/FIBER)  237 mL Per Tube TID PC   fiber  1 packet Per Tube BID   free water  200 mL Per Tube Q8H   heparin injection (subcutaneous)  5,000 Units Subcutaneous Q8H   hydrALAZINE  10 mg Per Tube BID   insulin aspart  0-15 Units Subcutaneous TID WC   metoprolol tartrate  50 mg Per Tube BID   mouth rinse  15 mL Mouth Rinse BID   revefenacin  175 mcg Nebulization Daily   saccharomyces boulardii  250 mg Per Tube BID      Subjective:   Dustin Jones was  seen and examined today.  No acute issues earlier this morning on exam however subsequently removed his trach.  No fevers or chills, nausea vomiting or diarrhea.   Objective:   Vitals:   10/23/22 0328 10/23/22 0414 10/23/22 0830 10/23/22 0835  BP:  (!) 145/89 (!) 149/91   Pulse:  75 84 84  Resp:  20 19 18   Temp:  98.8 F (37.1 C) 98.6 F (37 C)   TempSrc:  Oral Oral   SpO2: 90% 100% 99% 98%  Weight:      Height:        Intake/Output Summary (Last 24 hours) at 10/23/2022 1242 Last data filed at 10/23/2022 0831 Gross per 24 hour  Intake --  Output 1075 ml  Net -1075 ml     Wt Readings from Last 3 Encounters:  10/21/22 115.8 kg   Physical Exam General: Alert and oriented x 3, NAD Cardiovascular: S1 S2 clear, RRR.  Respiratory: CTAB, no wheezing, rales  Gastrointestinal: Soft, nontender, nondistended, NBS, PEG tube Ext: no pedal edema bilaterally Psych: flat affect   Data Reviewed:  I have personally reviewed following labs    CBC Lab Results  Component Value Date   WBC 53.7 (HH) 10/23/2022   RBC 4.40 10/23/2022   HGB 13.2 10/23/2022   HCT 40.2 10/23/2022   MCV 91.4 10/23/2022   MCH 30.0 10/23/2022   PLT 196 10/23/2022   MCHC 32.8 10/23/2022   RDW 12.7 10/23/2022   LYMPHSABS 47.8 (H) 10/23/2022   MONOABS 2.7 (H) 10/23/2022   EOSABS 0.5 10/23/2022  BASOSABS 0.0 [id number]     Last metabolic panel Lab Results  Component Value Date   NA 141 10/22/2022   K 3.8 10/22/2022   CL 105 10/22/2022   CO2 23 10/22/2022   BUN 14 10/22/2022   CREATININE 0.83 10/22/2022   GLUCOSE 107 (H) 10/22/2022   GFRNONAA >60 10/22/2022   CALCIUM 9.6 10/22/2022   PHOS 4.1 09/28/2022   PROT 7.5 09/29/2022   ALBUMIN 3.1 (L) 09/29/2022   BILITOT 0.6 09/29/2022   ALKPHOS 72 09/29/2022   AST 29 09/29/2022   ALT 40 09/29/2022   ANIONGAP 13 10/22/2022    CBG (last 3)  Recent Labs    10/22/22 1550 10/22/22 2035 10/23/22 0602  GLUCAP 109* 123* 101*        Radiology Studies: I have personally reviewed the imaging studies  No results found.     Estill Cotta M.D. [facility] 10/23/2022, 12:42 PM  Available via Epic secure chat 7am-7pm After 7 pm, please refer to night coverage provider listed on amion.

## 2022-10-23 NOTE — Progress Notes (Signed)
NAME:  Dustin Jones, MRN:  [id number], DOB:  [birth year], LOS: 52 ADMISSION DATE:  09-20-2022, CONSULTATION DATE:  09-20-22 REFERRING MD:  Anitra Lauth, EDP, CHIEF COMPLAINT:  ICH   History of Present Illness:  61 year old man who presented to [facility] 9/28 as a Code Stroke. PMHx significant for HTN, DVT and chronic tobacco use.   Admitted with a right intraparenchymal hemorrhage in the setting of right ICA aneurysm status post coiling.  Course complicated by recurrent pneumonia and respiratory failure requiring tracheostomy He was transferred to the floor 10/15 but transfer back due to respiratory distress on 10/16  Pertinent  Medical History  HTN, tobacco use, and DVT   Significant Hospital Events: Including procedures, antibiotic start and stop dates in addition to other pertinent events   9/28 admitted. Intracranial Hemorrhage/ R MCA ruptured aneurysm Non-con CT of head demonstrating large acute hemorrhage in the R lentiform nucleus with with intraventricular extension, midline shift, and slight rounding of the temporal horns, possibly representing early hydrocephalus. On CTA, pt found to have a R MCA ruptured aneurysm. Neurosurgery consulted and following with plans for possible arterial clipping this evening. CT of the face and cervical spine did not show any acute traumatic injuries, though BL cervical lymphadenopathy was noted. Echo with normal biventricular function without evidence of hemodynamically sig valvular heart disease.  9/28 complete echo with LVEF of 60-65%, no regional wall motion abnormalities but with grade 1 diastolic dysfunction.  Normal right ventricular systolic function.  No valvular abnormalities. 9/29 MRI brain 1. Large acute intraparenchymal hemorrhage centered in the rightlentiform nucleus, with extension into the right-greater-than-left lateral ventricle, third ventricle, and fourth ventricle, overall similar to the prior CT. Mild mass effect and 5 mm of right-to-left  midline shift. No hydrocephalus. 2. Small areas of acute infarct in the bilateral occipital lobes, bilateral frontal lobes, and right parietal lobe. Spiking fever. Has sig leukocytosis. Cultures sent. Unasyn started for aspiration  9/29 respiratory culture positive for MSSA 9/29 self extubated, required reintubation 10/2 Family identified  10/6 bronchospasm, steroids of this, bronc with clear lungs, ETT 10/9 Neuro exam/mental status remains poor. Coming down on vent requirements. BP improved, remains off of Cleviprex. D/w family re: tracheostomy. 10/10 Remains hypertensive, agitation. Sedation adjusted (Precedex, Fentanyl/Versed PRN). Increased peak pressures on vent. CXR worse with asymmetric pulmonary edema. Lasix given. Trach with CCM. 10/11 Cortrak placed 10/11 ID consulted, abx changed from Cefazolin to Cefepime 10/13 WBC up to 56 (was 49 day prior). CT chest/abd/pelv ordered. 10/15-transferred to the hospitalist service 10/16-Transferred to ICU for worsening respiratory status  10/18: Patient was taken off of ventilator and put on trach collar, patient initially did well, and then started having trouble breathing and was belly breathing, and therefore was back on the vent 10/20 Unable to tolerate Passy-Muir valve per speech therapy, Able to participate with PT 10/22- 09/26/2022>> on 60% ATC, tolerating well. Sats 93% 10/10/2022 >> on 28% ATC, Clearing secretions, strong cough 11/13  remains on ATC 28% 5L 11/19 Self decannulated, RT unable to place trach back    Interim History / Subjective:   Called to bedside to evaluate.  Patient self decannulated.  He is able to phonate talk clear his throat cough.  Not any respiratory distress.  No indication for replacement of tracheostomy at this time.   Objective   Blood pressure (!) 149/91, pulse 84, temperature 98.6 F (37 C), temperature source Oral, resp. rate 18, height 5\' 7"  (1.702 m), weight 115.8 kg, SpO2 98 %.  FiO2 (%):  [21 %]  21 %   Intake/Output Summary (Last 24 hours) at 10/23/2022 1129 Last data filed at 10/23/2022 0831 Gross per 24 hour  Intake --  Output 1075 ml  Net -1075 ml   Filed Weights   10/19/22 0437 10/20/22 0500 10/21/22 0500  Weight: 115.2 kg 115.2 kg 115.8 kg    Examination:  General: Chronically ill-appearing resting in bed HEENT trach stoma nearly closed no movement of air, trach removed Pulmonary: Clear to auscultation bilaterally no crackles no wheeze Cardiac: Regular rate rhythm S1-S2 Abdomen: Obese soft nontender nondistended Extremities: No significant edema    Resolved Hospital Problem list   Hypernatremia Hyperkalemia Thrombocytopenia HCAP   Assessment & Plan:   Janina Mayo dependent s/p ICH (R MCA aneurysm) s/p coiling   10/23/2022: Patient self decannulated Dysphagia PEG placed  Bilateral DVT HTN CLL vs Leukoid reaction  Plan Alerted that patient removed trach. He was seen and examined.  He is able to phonate able to cough and clear his throat. I do not think he needs to have the trach placed back. We will continue to observe at this time. Please call with any questions or concerns.   Josephine Igo, DO [facility] 10/23/2022 11:29 AM

## 2022-10-23 NOTE — Progress Notes (Signed)
RT called about patients trach being out. RT attempted to put trach back in but hole was closing up very quickly. Patient in no distress, O2 sats 92-97% on RA. Gauze applied to trach site and MD notified. RN at bedside.

## 2022-10-24 LAB — CBC
HCT: 40.2 % (ref 39.0–52.0)
Hemoglobin: 12.9 g/dL — ABNORMAL LOW (ref 13.0–17.0)
MCH: 29.5 pg (ref 26.0–34.0)
MCHC: 32.1 g/dL (ref 30.0–36.0)
MCV: 92 fL (ref 80.0–100.0)
Platelets: 193 10*3/uL (ref 150–400)
RBC: 4.37 MIL/uL (ref 4.22–5.81)
RDW: 12.9 % (ref 11.5–15.5)
WBC: 52.4 10*3/uL (ref 4.0–10.5)
nRBC: 0.1 % (ref 0.0–0.2)

## 2022-10-24 LAB — GLUCOSE, CAPILLARY
Glucose-Capillary: 114 mg/dL — ABNORMAL HIGH (ref 70–99)
Glucose-Capillary: 109 mg/dL — ABNORMAL HIGH (ref 70–99)
Glucose-Capillary: 92 mg/dL (ref 70–99)

## 2022-10-24 LAB — BASIC METABOLIC PANEL
Anion gap: 11 (ref 5–15)
BUN: 18 mg/dL (ref 8–23)
CO2: 24 mmol/L (ref 22–32)
Calcium: 9.3 mg/dL (ref 8.9–10.3)
Chloride: 105 mmol/L (ref 98–111)
Creatinine, Ser: 1.12 mg/dL (ref 0.61–1.24)
GFR, Estimated: >60 mL/min (ref >60)
Glucose, Bld: 115 mg/dL — ABNORMAL HIGH (ref 70–99)
Potassium: 3.8 mmol/L (ref 3.5–5.1)
Sodium: 140 mmol/L (ref 135–145)

## 2022-10-24 NOTE — Progress Notes (Signed)
Physical Therapy Treatment Patient Details Name: Dustin Jones MRN: [id number] DOB: [birth year] Today's Date: 10/24/2022   History of Present Illness The pt is a 61 y.o. unidentified homeless male presenting 9/28 after being found down by bystanders. 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 S/P coil embolization of R ICA aneurysm 9/28. Intubated 9/29. Trach placement 10/10.  Back to ICU 10/15 for respiratory failure, had IVC filter placed 10/26 and PEG.  No PMH on file.    PT Comments    Patient's gait continues to improve, although continues with narrow base of support, tendency to drift to his left, and occasional left knee hyperextension in stance. Able to increase distance to 110 ft with mod assist and chair to follow.     Recommendations for follow up therapy are one component of a multi-disciplinary discharge planning process, led by the attending physician.  Recommendations may be updated based on patient status, additional functional criteria and insurance authorization.  Follow Up Recommendations  Skilled nursing-short term rehab (<3 hours/day) Can patient physically be transported by private vehicle: No   Assistance Recommended at Discharge Frequent or constant Supervision/Assistance  Patient can return home with the following Two people to help with walking and/or transfers;Two people to help with bathing/dressing/bathroom;Assistance with cooking/housework;Assistance with feeding;Direct supervision/assist for medications management;Direct supervision/assist for financial management;Help with stairs or ramp for entrance;Assist for transportation   Equipment Recommendations  Other (comment) (TBD)    Recommendations for Other Services       Precautions / Restrictions Precautions Precautions: Fall Precaution Comments: PEG Restrictions Weight Bearing  Restrictions: No     Mobility  Bed Mobility Overal bed mobility: Needs Assistance Bed Mobility: Rolling, Sidelying to Sit Rolling: Supervision Sidelying to sit: Min guard       General bed mobility comments: incr time, effort and use of rail to come to sit    Transfers Overall transfer level: Needs assistance Equipment used: Bilateral platform walker (EVA walker) Transfers: Sit to/from Stand Sit to Stand: Min assist           General transfer comment: min assist to come to stand with EVA walker from EOB and recliner with pt demonstrating good power up; vc for sequencing, especially with LUE as going to sit down    Ambulation/Gait Ambulation/Gait assistance: Mod assist, +2 safety/equipment Gait Distance (Feet): 44 Feet (seated rest; 110) Assistive device: Bilateral platform walker (EVA walker) Gait Pattern/deviations: Step-through pattern, Decreased stride length, Narrow base of support, Knee hyperextension - left Gait velocity: decr     General Gait Details: pt advancing LLE on his own and not crossing midline when advancing; some hyperextension of left knee noted, but not every step; required assist to control EVA walker while 2nd person followed with chair--some tendency to drift to the left   Stairs             Wheelchair Mobility    Modified Rankin (Stroke Patients Only) Modified Rankin (Stroke Patients Only) Pre-Morbid Rankin Score: No symptoms Modified Rankin: Moderately severe disability     Balance Overall balance assessment: Needs assistance Sitting-balance support: Feet supported Sitting balance-Leahy Scale: Fair Sitting balance - Comments: EOB with feet supported   Standing balance support: Bilateral upper extremity supported Standing balance-Leahy Scale: Poor Standing balance comment: Min A for static standing  Cognition Arousal/Alertness: Awake/alert Behavior During Therapy: Flat affect Overall  Cognitive Status: Impaired/Different from baseline Area of Impairment: Attention, Following commands, Problem solving, Safety/judgement, Awareness                   Current Attention Level: Sustained   Following Commands: Follows one step commands with increased time Safety/Judgement: Decreased awareness of deficits Awareness:  (cont decresaed awareness of R side of environment) Problem Solving: Slow processing, Requires verbal cues General Comments: Following all one step commands with incresaed time. Requires repeated min cues for maintaining space between his feet when walking.        Exercises      General Comments        Pertinent Vitals/Pain Pain Assessment Pain Assessment: No/denies pain    Home Living                          Prior Function            PT Goals (current goals can now be found in the care plan section) Acute Rehab PT Goals Patient Stated Goal: agrees to goal of walking more PT Goal Formulation: Patient unable to participate in goal setting Time For Goal Achievement: 11/03/22 Potential to Achieve Goals: Good Progress towards PT goals: Progressing toward goals    Frequency    Min 2X/week      PT Plan Current plan remains appropriate    Co-evaluation              AM-PAC PT "6 Clicks" Mobility   Outcome Measure  Help needed turning from your back to your side while in a flat bed without using bedrails?: A Little Help needed moving from lying on your back to sitting on the side of a flat bed without using bedrails?: A Little Help needed moving to and from a bed to a chair (including a wheelchair)?: A Lot Help needed standing up from a chair using your arms (e.g., wheelchair or bedside chair)?: A Lot Help needed to walk in hospital room?: A Lot Help needed climbing 3-5 steps with a railing? : Total 6 Click Score: 13    End of Session Equipment Utilized During Treatment: Gait belt Activity Tolerance: Patient  tolerated treatment well Patient left: with call bell/phone within reach;in chair;with chair alarm set Nurse Communication: Mobility status PT Visit Diagnosis: Other abnormalities of gait and mobility (R26.89);Hemiplegia and hemiparesis;Other symptoms and signs involving the nervous system (R29.898);Muscle weakness (generalized) (M62.81) Hemiplegia - Right/Left: Left Hemiplegia - dominant/non-dominant: Non-dominant Hemiplegia - caused by: Nontraumatic intracerebral hemorrhage     Time: 7829-5621 PT Time Calculation (min) (ACUTE ONLY): 26 min  Charges:  $Gait Training: 23-37 mins                      Jerolyn Center, PT Acute Rehabilitation Services  Office [phone]    Zena Amos 10/24/2022, 11:53 AM

## 2022-10-24 NOTE — Progress Notes (Addendum)
[facility]                                                                              Dustin Jones, is a 61 y.o. male, DOB - Nov 07, 1961, LU:[id number] Admit date - 08/07/2022    Outpatient Primary MD for the patient is [name], [name], NP  LOS - 50  days  Chief Complaint  Patient presents with   Code Stroke       Brief summary   61 year old with past medical history significant for hypertension, history of DVT admitted 9/28 as a code stroke, admitted to ICU with working diagnosis of intracranial hemorrhage right MCA ruptured aneurysm, has had prolonged hospitalization including multiple procedures, work-up including right ICA coil embolization of right ICA terminus aneurysm on 08/19/2022, tracheostomy by CCM on 10/10 and transferred to [facility] on 10/22.   9/28 admitted intracranial hemorrhage right MCA ruptured aneurysm status post CT head. 9/29: MRI brain large acute intraparenchymal hemorrhage extension into the right greater than left lateral ventricle, third ventricle and fourth ventricle. 9/29 developed fever and pneumonia, MSSA and respiratory culture white blood cell up to 56.  Treated. 10/15 transferred to the hospitalist service, transferred back to ICU for worsening respiratory status.  Was placed back on vent. 10/26; IVC filter and PEG tube placed by IR. 10/28: After conference call with family CODE STATUS was changed to DNR/DNI do not placed back on vent support. 11/01: Speech recommend Dysphagia 3 diet.  11/07 started Bactrim to cover for PNA.  11/16: Tracheostomy tube was changed to a #6 Shiley flex cuffless  11/19: trach removed by the patient, assessed by CCM, Dr. 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 -Trach management per CCM, continue routine trach care.  Last seen by CCM on 11/13, no plan for downsizing yet. -On 11/6, patient had low-grade fever, leukocytosis, sputum cultures positive for Staph aureus, was placed on Bactrim.  Due to rising creatinine, Bactrim was changed over to meropenem on 11/11, has completed course of antibiotics. -Tracheostomy tube changed to #6 Shiley flex cuffless on 11/16 -Self decannulated on 11/19, RT was unable to place trach back, was recommended to observe without trach by Dr. Valeta Harms  Acute kidney injury -Creatinine plateaued at 1.78 on 11/11, was placed on IV fluids, ACE inhibitor held -Creatinine has improved, back to baseline, 0.9 -Continue to hold lisinopril    Dysphagia Status post PEG tube placed by IR 10/26, was placed on tube feedings -Has made significant progress with oral intake, speech therapy recommended dysphagia 3 diet on 11/1 -Tolerating dysphagia 3 diet.   Bilateral DVTs - Not a candidate for  anticoagulation due to [place] - status post IVC filter in 09/29/2022 by IR.   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  Has completed antibiotics course. -Now has remained between 50-55, will consult hematology oncology for recommendations. - d/w Dr Lindi Adie, ordered flow cytometry and CLL FISH panel per recommendations, will evaluate   Diabetes mellitus type 2 -CBGs controlled, continue SSI   Fall on 11/13, again on 11/18 No obvious injuries noted.      Thrombocytopenia: Resolved Nonsustained V. tach: Continue with metoprolol.  Oral thrush: Received Diflucan 11/6 for 5 days.    Obesity Class 3  Estimated body mass index is 39.98 kg/m as calculated from the following:   Height as of this encounter: 5\' 7"  (1.702 m).   Weight as of this encounter: 115.8 kg.  Code Status: DNR DVT Prophylaxis:  heparin injection 5,000 Units Start: 09/03/22 1400 SCD's Start: 08/14/2022 0930   Level of Care: Level of care: Med-Surg Family Communication: Updated patient Disposition Plan:      Remains inpatient appropriate: Difficult to place   Procedures:   Consultants:   PCCM  Antimicrobials:   Anti-infectives (From admission, onward)    Start     Dose/Rate Route Frequency Ordered Stop   10/16/22 0900  meropenem (MERREM) 1 g in sodium chloride 0.9 % 100 mL IVPB        1 g 200 mL/hr over 30 Minutes Intravenous Every 8 hours 10/16/22 0803 10/18/22 0700   10/15/22 1000  meropenem (MERREM) 1 g in sodium chloride 0.9 % 100 mL IVPB  Status:  Discontinued        1 g 200 mL/hr over 30 Minutes Intravenous Every 12 hours 10/15/22 0842 10/16/22 0803   10/11/22 1200  sulfamethoxazole-trimethoprim (BACTRIM DS) 800-160 MG per tablet 1 tablet  Status:  Discontinued        1 tablet Oral Every 12 hours 10/11/22 1106 10/15/22 0842   10/10/22 1000  fluconazole (DIFLUCAN) 40 MG/ML suspension 100 mg        100 mg Per Tube Daily 10/10/22 0835 10/14/22 1003   10/05/22 1415  fluconazole (DIFLUCAN) 40 MG/ML suspension 100 mg  Status:  Discontinued        100 mg Oral Daily 10/05/22 1319 10/10/22 0835   09/29/22 1445  ceFAZolin (ANCEF) IVPB 2g/100 mL premix  Status:  Discontinued        2 g 200 mL/hr over 30 Minutes Intravenous  Once 09/29/22 1346 10/12/22 1148   09/29/22 1145  ceFAZolin (ANCEF) IVPB 1 g/50 mL premix  Status:  Discontinued        1 g 100 mL/hr over 30 Minutes Intravenous To Radiology 09/29/22 1057 09/29/22 1059   09/29/22 1145  ceFAZolin (ANCEF) IVPB  2g/100 mL premix        2 g 200 mL/hr over 30 Minutes Intravenous  Once 09/29/22 1059 09/29/22 1159   09/14/22 1400  ceFEPIme (MAXIPIME) 2 g in sodium chloride 0.9 % 100 mL IVPB        2 g 200 mL/hr over 30 Minutes Intravenous Every 8 hours 09/14/22 1312 09/21/22 0922   09/10/22 0830  ceFAZolin (ANCEF) IVPB 1 g/50 mL premix  Status:  Discontinued        1 g 100 mL/hr over 30 Minutes Intravenous Every 8 hours 09/10/22 0738 09/10/22 0821   09/10/22 0830  ceFAZolin (ANCEF)  IVPB 2g/100 mL premix  Status:  Discontinued        2 g 200 mL/hr over 30 Minutes Intravenous Every 8 hours 09/10/22 0821 09/14/22 1312   09/07/22 0815  ceFAZolin (ANCEF) IVPB 2g/100 mL premix        2 g 200 mL/hr over 30 Minutes Intravenous Every 8 hours 09/07/22 0807 09/08/22 1742   09/04/22 2200  Ampicillin-Sulbactam (UNASYN) 3 g in sodium chloride 0.9 % 100 mL IVPB  Status:  Discontinued        3 g 200 mL/hr over 30 Minutes Intravenous Every 24 hours 09/04/22 0722 09/04/22 1506   09/04/22 2200  ceFAZolin (ANCEF) IVPB 1 g/50 mL premix  Status:  Discontinued        1 g 100 mL/hr over 30 Minutes Intravenous Every 12 hours 09/04/22 1506 09/07/22 0807   09/02/22 1030  Ampicillin-Sulbactam (UNASYN) 3 g in sodium chloride 0.9 % 100 mL IVPB  Status:  Discontinued        3 g 200 mL/hr over 30 Minutes Intravenous Every 6 hours 09/02/22 1009 09/04/22 0722          Medications  amLODipine  10 mg Per Tube Daily   arformoterol  15 mcg Nebulization BID   famotidine  20 mg Per Tube Daily   feeding supplement (JEVITY 1.5 CAL/FIBER)  237 mL Per Tube TID PC   fiber  1 packet Per Tube BID   free water  200 mL Per Tube Q8H   heparin injection (subcutaneous)  5,000 Units Subcutaneous Q8H   hydrALAZINE  10 mg Per Tube BID   insulin aspart  0-15 Units Subcutaneous TID WC   metoprolol tartrate  50 mg Per Tube BID   mouth rinse  15 mL Mouth Rinse BID   revefenacin  175 mcg Nebulization Daily   saccharomyces boulardii  250 mg Per  Tube BID      Subjective:   Dustin Jones was seen and examined today.   Decannulated on 11/19, so far no acute issues no acute issues.  No fevers or chills, nausea or vomiting.  Objective:   Vitals:   10/23/22 0328 10/23/22 0414 10/23/22 0830 10/23/22 0835  BP:  (!) 145/89 (!) 149/91   Pulse:  75 84 84  Resp:  20 19 18   Temp:  98.8 F (37.1 C) 98.6 F (37 C)   TempSrc:  Oral Oral   SpO2: 90% 100% 99% 98%  Weight:      Height:        Intake/Output Summary (Last 24 hours) at 10/23/2022 1242 Last data filed at 10/23/2022 0831 Gross per 24 hour  Intake --  Output 1075 ml  Net -1075 ml     Wt Readings from Last 3 Encounters:  10/21/22 115.8 kg    Physical Exam General: Alert and oriented x 3, NAD, dressing intact on trach site Cardiovascular: S1 S2 clear, RRR.  Respiratory: CTAB, no wheezing Gastrointestinal: Soft, nontender, nondistended, NBS, PEG tube Ext: no pedal edema bilaterally Neuro: left-sided hemiparesis Psych: Normal affect    Data Reviewed:  I have personally reviewed following labs    CBC Lab Results  Component Value Date   WBC 53.7 (HH) 10/23/2022   RBC 4.40 10/23/2022   HGB 13.2 10/23/2022   HCT 40.2 10/23/2022   MCV 91.4 10/23/2022   MCH 30.0 10/23/2022   PLT 196 10/23/2022   MCHC 32.8 10/23/2022   RDW 12.7 10/23/2022   LYMPHSABS 47.8 (H) 10/23/2022   MONOABS 2.7 (H)  10/23/2022   EOSABS 0.5 10/23/2022   BASOSABS 0.0 [id number]     Last metabolic panel Lab Results  Component Value Date   NA 141 10/22/2022   K 3.8 10/22/2022   CL 105 10/22/2022   CO2 23 10/22/2022   BUN 14 10/22/2022   CREATININE 0.83 10/22/2022   GLUCOSE 107 (H) 10/22/2022   GFRNONAA >60 10/22/2022   CALCIUM 9.6 10/22/2022   PHOS 4.1 09/28/2022   PROT 7.5 09/29/2022   ALBUMIN 3.1 (L) 09/29/2022   BILITOT 0.6 09/29/2022   ALKPHOS 72 09/29/2022   AST 29 09/29/2022   ALT 40 09/29/2022   ANIONGAP 13 10/22/2022    CBG (last 3)  Recent Labs     10/22/22 1550 10/22/22 2035 10/23/22 0602  GLUCAP 109* 123* 101*       Radiology Studies: I have personally reviewed the imaging studies  No results found.     Estill Cotta M.D. [facility] 10/23/2022, 12:42 PM  Available via Epic secure chat 7am-7pm After 7 pm, please refer to night coverage provider listed on amion.

## 2022-10-24 NOTE — Progress Notes (Signed)
Speech Language Pathology Treatment: Dysphagia;Cognitive-Linquistic  Patient Details Name: Dustin Jones MRN: [id number] DOB: 27-Jul-1961 Today's Date: 10/24/2022 Time: 1751-0258 SLP Time Calculation (min) (ACUTE ONLY): 15 min  Assessment / Plan / Recommendation Clinical Impression  Pt seen at bedside for skilled ST intervention targeting goals for cognition and communication. Pt decannulated himself over the weekend, and has tolerated this event. Vocal intensity continues to be very very low, necessitating the frequent need for repetition. Pt continues to exhibit poor eye contact and flat affect. He appears to not want to communicate or answer questions, making focus difficult on these goals. Pt appears to be tolerating mechanical soft (Dys3) diet with thin liquids, and verbalizes that he does not want to advance textures at this time. Will continue current diet.  SLP will continue to see pt, focused on communication of wants and needs, and following commands.    HPI HPI: Dustin Jones is a 61 y.o. male who is homeless, presenting 07-Sep-2022 after being found down by bystanders. 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 S/P coil embolization of R ICA aneurysm 9/28. Intubated 9/29. Trach placement 10/10. Cortrak 10/11; G-tube 10/26.  No PMH on file. MBS 10/05/22 D3/thin, crushed meds      SLP Plan  Continue with current plan of care      Recommendations for follow up therapy are one component of a multi-disciplinary discharge planning process, led by the attending physician.  Recommendations may be updated based on patient status, additional functional criteria and insurance authorization.    Recommendations  Diet recommendations: Dysphagia 3 (mechanical soft);Thin liquid Liquids provided via: Cup;Straw Medication Administration: Crushed with puree Supervision: Staff to assist  with self feeding;Patient able to self feed;Full supervision/cueing for compensatory strategies Compensations: Slow rate;Small sips/bites;Minimize environmental distractions;Follow solids with liquid Postural Changes and/or Swallow Maneuvers: Seated upright 90 degrees                Oral Care Recommendations: Oral care QID Follow Up Recommendations: Skilled nursing-short term rehab (<3 hours/day) Assistance recommended at discharge: Frequent or constant Supervision/Assistance SLP Visit Diagnosis: Dysphagia, oral phase (R13.11);Cognitive communication deficit (R41.841) Plan: Continue with current plan of care          Dustin Jones, [facility], CCC-SLP Speech Language Pathologist Office: [phone]  Dustin Jones 10/24/2022, 2:25 PM

## 2022-10-24 NOTE — Progress Notes (Signed)
NAME:  Dustin Jones, MRN:  [id number], DOB:  03-08-1961, LOS: 52 ADMISSION DATE:  09-03-22, CONSULTATION DATE:  09-03-22 REFERRING MD:  Anitra Lauth, EDP, CHIEF COMPLAINT:  ICH   History of Present Illness:  61 year old man who presented to [facility] 9/28 as a Code Stroke. PMHx significant for HTN, DVT and chronic tobacco use.   Admitted with a right intraparenchymal hemorrhage in the setting of right ICA aneurysm status post coiling.  Course complicated by recurrent pneumonia and respiratory failure requiring tracheostomy He was transferred to the floor 10/15 but transfer back due to respiratory distress on 10/16  Pertinent  Medical History  HTN, tobacco use, and DVT   Significant Hospital Events: Including procedures, antibiotic start and stop dates in addition to other pertinent events   9/28 admitted. Intracranial Hemorrhage/ R MCA ruptured aneurysm Non-con CT of head demonstrating large acute hemorrhage in the R lentiform nucleus with with intraventricular extension, midline shift, and slight rounding of the temporal horns, possibly representing early hydrocephalus. On CTA, pt found to have a R MCA ruptured aneurysm. Neurosurgery consulted and following with plans for possible arterial clipping this evening. CT of the face and cervical spine did not show any acute traumatic injuries, though BL cervical lymphadenopathy was noted. Echo with normal biventricular function without evidence of hemodynamically sig valvular heart disease.  9/28 complete echo with LVEF of 60-65%, no regional wall motion abnormalities but with grade 1 diastolic dysfunction.  Normal right ventricular systolic function.  No valvular abnormalities. 9/29 MRI brain 1. Large acute intraparenchymal hemorrhage centered in the rightlentiform nucleus, with extension into the right-greater-than-left lateral ventricle, third ventricle, and fourth ventricle, overall similar to the prior CT. Mild mass effect and 5 mm of right-to-left  midline shift. No hydrocephalus. 2. Small areas of acute infarct in the bilateral occipital lobes, bilateral frontal lobes, and right parietal lobe. Spiking fever. Has sig leukocytosis. Cultures sent. Unasyn started for aspiration  9/29 respiratory culture positive for MSSA 9/29 self extubated, required reintubation 10/2 Family identified  10/6 bronchospasm, steroids of this, bronc with clear lungs, ETT 10/9 Neuro exam/mental status remains poor. Coming down on vent requirements. BP improved, remains off of Cleviprex. D/w family re: tracheostomy. 10/10 Remains hypertensive, agitation. Sedation adjusted (Precedex, Fentanyl/Versed PRN). Increased peak pressures on vent. CXR worse with asymmetric pulmonary edema. Lasix given. Trach with CCM. 10/11 Cortrak placed 10/11 ID consulted, abx changed from Cefazolin to Cefepime 10/13 WBC up to 56 (was 49 day prior). CT chest/abd/pelv ordered. 10/15-transferred to the hospitalist service 10/16-Transferred to ICU for worsening respiratory status  10/18: Patient was taken off of ventilator and put on trach collar, patient initially did well, and then started having trouble breathing and was belly breathing, and therefore was back on the vent 10/20 Unable to tolerate Passy-Muir valve per speech therapy, Able to participate with PT 10/22- 09/26/2022>> on 60% ATC, tolerating well. Sats 93% 10/10/2022 >> on 28% ATC, Clearing secretions, strong cough 11/13  remains on ATC 28% 5L 11/19 Self decannulated, RT unable to place trach back    Interim History / Subjective:  Sitting up in chair no distress   Objective   Blood pressure (!) 149/91, pulse 84, temperature 98.6 F (37 C), temperature source Oral, resp. rate 18, height 5\' 7"  (1.702 m), weight 115.8 kg, SpO2 98 %.    FiO2 (%):  [21 %] 21 %   Intake/Output Summary (Last 24 hours) at 10/23/2022 1129 Last data filed at 10/23/2022 0831 Gross per 24 hour  Intake --  Output 1075 ml  Net -1075 ml   Filed  Weights   10/19/22 0437 10/20/22 0500 10/21/22 0500  Weight: 115.2 kg 115.2 kg 115.8 kg    Examination:  General 61 year old male. Resting in chair no distress HENT NCAT trach stoma already closed Pulm clear  Card rrr Abd soft Ext warm  Neuro awake. Interactive affect flat     Resolved Hospital Problem list   Hypernatremia Hyperkalemia Thrombocytopenia HCAP   Assessment & Plan:   Lurline Idol dependent s/p ICH (R MCA aneurysm) s/p coiling   10/23/2022: Patient self decannulated Dysphagia PEG placed  Bilateral DVT HTN CLL vs Leukoid reaction  Pulm problem list S/p trach dependence  Decannulated self 11/19  Stable   Plan Pulm will so call PRN   Erick Colace ACNP-BC [place] Pager # [phone] OR # [phone] if no answer

## 2022-10-25 LAB — GLUCOSE, CAPILLARY
Glucose-Capillary: 110 mg/dL — ABNORMAL HIGH (ref 70–99)
Glucose-Capillary: 106 mg/dL — ABNORMAL HIGH (ref 70–99)
Glucose-Capillary: 106 mg/dL — ABNORMAL HIGH (ref 70–99)
Glucose-Capillary: 101 mg/dL — ABNORMAL HIGH (ref 70–99)
Glucose-Capillary: 131 mg/dL — ABNORMAL HIGH (ref 70–99)

## 2022-10-25 LAB — CBC WITH DIFFERENTIAL/PLATELET
Abs Immature Granulocytes: 0.08 10*3/uL — ABNORMAL HIGH (ref 0.00–0.07)
Basophils Absolute: 0.2 10*3/uL — ABNORMAL HIGH (ref 0.0–0.1)
Basophils Relative: 0 %
Eosinophils Absolute: 0.4 10*3/uL (ref 0.0–0.5)
Eosinophils Relative: 1 %
HCT: 38.4 % — ABNORMAL LOW (ref 39.0–52.0)
Hemoglobin: 12.2 g/dL — ABNORMAL LOW (ref 13.0–17.0)
Immature Granulocytes: 0 %
Lymphocytes Relative: 85 %
Lymphs Abs: 45.5 10*3/uL — ABNORMAL HIGH (ref 0.7–4.0)
MCH: 29.4 pg (ref 26.0–34.0)
MCHC: 31.8 g/dL (ref 30.0–36.0)
MCV: 92.5 fL (ref 80.0–100.0)
Monocytes Absolute: 4.5 10*3/uL — ABNORMAL HIGH (ref 0.1–1.0)
Monocytes Relative: 8 %
Neutro Abs: 3 10*3/uL (ref 1.7–7.7)
Neutrophils Relative %: 6 %
Platelets: 188 10*3/uL (ref 150–400)
RBC: 4.15 MIL/uL — ABNORMAL LOW (ref 4.22–5.81)
RDW: 13 % (ref 11.5–15.5)
WBC: 53.7 10*3/uL (ref 4.0–10.5)
nRBC: 0 % (ref 0.0–0.2)

## 2022-10-25 LAB — SURGICAL PATHOLOGY

## 2022-10-25 MED ORDER — FREE WATER
150.0000 mL | Status: DC
  Administered 2022-10-25 – 2022-11-02 (×42): 150 mL

## 2022-10-25 MED ORDER — JEVITY 1.5 CAL/FIBER PO LIQD
1000.0000 mL | ORAL | Status: DC
  Administered 2022-10-25 – 2022-10-29 (×5): 1000 mL
  Filled 2022-10-25 (×6): qty 1000

## 2022-10-25 NOTE — Progress Notes (Signed)
Nutrition Follow-up  DOCUMENTATION CODES:  Obesity unspecified  INTERVENTION:  Continue current diet per SLP recommendations Nursing to assist with feeding patient Adjust tube feeds via PEG to nocturnal to meet ~60% of needs: Jevity 1.5 @ 70 ml/hr (840 ml/day) Free water flushes 150 ml q 4 hours Tube feeding regimen provides 1260 kcal, 54g of protein, and 547 ml of H2O. (Flush+TF=1538)  NUTRITION DIAGNOSIS:  Inadequate oral intake related to inability to eat as evidenced by NPO status. - Ongoing, being addressed via TF  GOAL:  Patient will meet greater than or equal to 90% of their needs - Progressing, met via TF and diet  MONITOR:  TF tolerance  REASON FOR ASSESSMENT:  Consult Enteral/tube feeding initiation and management  ASSESSMENT:  Pt with unknown PMH who is reportedly homeless admitted with ICH/R MCA ruptured aneurysm now s/p coiling and acute hypoxic respiratory failure PNA/ALI due to aspiration.  09/29 - TF started 10/10 - s/p trach placement 10/11 - s/p cortrak placement (tip gastric) 10/12 - weaned to trach collar 10/16 - transferred to ICU, back on vent support 10/18 - Initial PMV assessment 10/22 - Transferred out of ICU 10/26 - PEG placed 11/1 - MBS, advanced to DYS3 with thin liquids  Admit weight: 100 kg - unsure of accuracy, appears stated Current weight: 107.2 kg  Pt resting in bedside chair at the time of assessment. Opened eyes briefly when he was spoken to, but did not answer questions. Reviewed intake since last assessment, no meals recorded. Discussed with RN and reviewed medication administration hx as a carton of Jevity 1.5 is ordered to be given as a bolus feed after each meal if pt consumes <50% of his meal. Pt has had to receive a bolus 3x/d since 11/16 for poor intake. Burdensome to nursing staff. Would be in favor of letting TF run nocturnally and continuing to allow pt to eat during the day. Discussed with RN and MD.  Average Meal  Intake: 11/1-11/6: 45% average intake x 12 recorded meals 11/17-11/21: No meals recorded, however per bolus feeding regimen, opt is routinely consuming <50% of his meal  Nutritionally Relevant Medications: Scheduled Meds:  famotidine  20 mg Per Tube Daily   JEVITY 1.5 CAL/FIBER  237 mL Per Tube TID PC   fiber  1 packet Per Tube BID   free water  200 mL Per Tube Q8H   insulin aspart  0-15 Units Subcutaneous TID WC   saccharomyces boulardii  250 mg Per Tube BID   Labs Reviewed  Diet Order:   Diet Order             DIET DYS 3 Room service appropriate? Yes with Assist; Fluid consistency: Thin  Diet effective now                   EDUCATION NEEDS:  No education needs have been identified at this time  Skin: Skin Integrity Issues: Stage II: R leg (pretibial) - old healing ulcer Other: MARSI penis  Last BM:  11/20 - type 2  Height:  Ht Readings from Last 1 Encounters:  09/18/22 _0  (1.702 m)   Weight:  Wt Readings from Last 1 Encounters:  10/25/22 107.2 kg   BMI:  Body mass index is 37.02 kg/m.  Estimated Nutritional Needs:  Kcal:  2000-2200 Protein:  100-120 grams Fluid:  >2 L/day    Ranell Patrick, RD, LDN Clinical Dietitian RD pager # available in [place]  After hours/weekend pager # available in [facility]

## 2022-10-25 NOTE — Progress Notes (Signed)
Mobility Specialist: Prog  10/25/22 1206  Mobility  Activity Ambulated with assistance in hallway  Level of Assistance Minimal assist, patient does 75% or more  Assistive Device Four wheel walker (EVA)  Distance Ambulated (ft) 200 ft (150'+50')  Activity Response Tolerated well  Mobility Referral Yes  $Mobility charge 1 Mobility   Pt received in the bed and agreeable to mobility. Mod I with bed mobility and minA to stand. Pt worked on ADLs with OT and agreeable to hallway ambulation after. Cues for upright posture and to widen BOS throughout. Stopped x1 for seated break secondary to fatigue and R knee buckling x2. Pt set up in the chair with his lunch. NT present in the room. Chair alarm is on.   Tayt Moyers Mobility Specialist Please contact via SecureChat or Rehab office at [phone]

## 2022-10-25 NOTE — Progress Notes (Signed)
Occupational Therapy Treatment Patient Details Name: Dustin Jones MRN: [id number] DOB: [birth year] Today's Date: 10/25/2022   History of present illness The pt is a 61 y.o. unidentified homeless male presenting 9/28 after being found down by bystanders. 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 S/P coil embolization of R ICA aneurysm 9/28. Intubated 9/29. Trach placement 10/10.  Back to ICU 10/15 for respiratory failure, had IVC filter placed 10/26 and PEG.  No PMH on file.   OT comments  Patient received in supine and agreeable to OT treatment with mobility present to assist. Patient able to get up with EVA walker and stand at sink for grooming with education on LUE use for gross assist but continued to require assistance to load toothbrush. Patient urinated while standing at sink and assisted with gown change, bathing feet and changing socks while seated in recliner.  Patient performed mobility in hallway with EVA walker and required one seated rest break before returning to room. Patient left in recliner with Posey alarm belt and setup for lunch with nursing tech in room. Acute OT to continue to follow.    Recommendations for follow up therapy are one component of a multi-disciplinary discharge planning process, led by the attending physician.  Recommendations may be updated based on patient status, additional functional criteria and insurance authorization.    Follow Up Recommendations  Skilled nursing-short term rehab (<3 hours/day)     Assistance Recommended at Discharge Frequent or constant Supervision/Assistance  Patient can return home with the following  Two people to help with walking and/or transfers;Two people to help with bathing/dressing/bathroom;Help with stairs or ramp for entrance;Assist for transportation;Direct supervision/assist for financial management;Direct  supervision/assist for medications management;Assistance with cooking/housework   Equipment Recommendations  Other (comment) (defer to next venue)    Recommendations for Other Services      Precautions / Restrictions Precautions Precautions: Fall Precaution Comments: PEG Restrictions Weight Bearing Restrictions: No       Mobility Bed Mobility Overal bed mobility: Needs Assistance Bed Mobility: Rolling, Sidelying to Sit Rolling: Supervision Sidelying to sit: Min guard       General bed mobility comments: verbal cues to initiate and increased time with use of rails to assist    Transfers Overall transfer level: Needs assistance Equipment used: Bilateral platform walker (EVA walker) Transfers: Sit to/from Stand Sit to Stand: Min assist           General transfer comment: verbal and tactile cues to push up to stand and EVA walker used for transfers     Balance Overall balance assessment: Needs assistance Sitting-balance support: Feet supported Sitting balance-Leahy Scale: Fair Sitting balance - Comments: EOB with feet supported   Standing balance support: Bilateral upper extremity supported Standing balance-Leahy Scale: Poor Standing balance comment: stood at sink with EVA walker for support                           ADL either performed or assessed with clinical judgement   ADL Overall ADL's : Needs assistance/impaired Eating/Feeding: Supervision/ safety   Grooming: Wash/dry face;Oral care;Moderate assistance;Standing Grooming Details (indicate cue type and reason): instructions on use of LUE as gross assist to hold toothbrush but continued to require assistance to load toothbrush     Lower Body Bathing: Sitting/lateral leans;Minimal assistance Lower Body Bathing Details (indicate cue type and reason): required min assist to complete washing feet  Upper Body Dressing : Moderate assistance;Sitting Upper Body Dressing Details (indicate cue type and  reason): to change gown Lower Body Dressing: Maximal assistance;Sitting/lateral leans Lower Body Dressing Details (indicate cue type and reason): patient able to pull up socks with RUE from ankle               General ADL Comments: focused on grooming standing at sink and patient urinated while standing at sink and required assistance to doff and donn socks and gown, and to wash feet    Extremity/Trunk Assessment Upper Extremity Assessment LUE Coordination: decreased fine motor;decreased gross motor            Vision       Perception     Praxis      Cognition Arousal/Alertness: Awake/alert Behavior During Therapy: Flat affect Overall Cognitive Status: Impaired/Different from baseline Area of Impairment: Attention, Following commands, Problem solving, Safety/judgement, Awareness                   Current Attention Level: Sustained   Following Commands: Follows one step commands with increased time Safety/Judgement: Decreased awareness of deficits   Problem Solving: Slow processing, Requires verbal cues General Comments: required increased time to follow commands and tactile cues to use LUE        Exercises      Shoulder Instructions       General Comments      Pertinent Vitals/ Pain       Pain Assessment Pain Assessment: Faces Faces Pain Scale: No hurt Pain Intervention(s): Monitored during session  Home Living                                          Prior Functioning/Environment              Frequency  Min 2X/week        Progress Toward Goals  OT Goals(current goals can now be found in the care plan section)  Progress towards OT goals: Progressing toward goals  Acute Rehab OT Goals OT Goal Formulation: Patient unable to participate in goal setting Time For Goal Achievement: 11/04/22 Potential to Achieve Goals: Fair ADL Goals Pt Will Perform Grooming: with min assist;standing Pt Will Perform Upper Body  Dressing: with min assist;sitting Pt Will Perform Lower Body Dressing: with mod assist;sit to/from stand Pt Will Transfer to Toilet: ambulating;bedside commode;with min assist Pt/caregiver will Perform Home Exercise Program: Left upper extremity;With minimal assist;With written HEP provided;Increased strength;Increased ROM Additional ADL Goal #1: Pt will complete 3 consecutive grooming tasks standing at sink without seated rest break Additional ADL Goal #2: Pt will demonstrate upright posture without lateral lean during functional activity and be able to self correct with 1-2 cues  Plan Discharge plan remains appropriate    Co-evaluation                 AM-PAC OT "6 Clicks" Daily Activity     Outcome Measure   Help from another person eating meals?: A Little Help from another person taking care of personal grooming?: A Lot Help from another person toileting, which includes using toliet, bedpan, or urinal?: A Lot Help from another person bathing (including washing, rinsing, drying)?: A Lot Help from another person to put on and taking off regular upper body clothing?: A Lot Help from another person to put on and taking off regular lower body clothing?: Total  6 Click Score: 12    End of Session Equipment Utilized During Treatment: Gait belt;Other (comment) (EVA walker)  OT Visit Diagnosis: Muscle weakness (generalized) (M62.81);Other symptoms and signs involving the nervous system (R29.898);Other symptoms and signs involving cognitive function   Activity Tolerance Patient tolerated treatment well   Patient Left in chair;with call bell/phone within reach;Other (comment) (Posey belt alarm)   Nurse Communication Mobility status;Other (comment) (posey belt applied)        Time: 1132-1201 OT Time Calculation (min): 29 min  Charges: OT General Charges $OT Visit: 1 Visit OT Treatments $Self Care/Home Management : 8-22 mins $Therapeutic Activity: 8-22 mins  Alfonse Flavors,  [facility] Acute Rehabilitation Services  Office [phone]   Dewain Penning 10/25/2022, 1:29 PM

## 2022-10-25 NOTE — Progress Notes (Signed)
[facility]                                                                              Dustin Jones, is a 61 y.o. male, DOB - 1961/06/05, XHB:[id number] Admit date - September 06, 2022    Outpatient Primary MD for the patient is [name], Chales Abrahams, NP  LOS - 54  days  Chief Complaint  Patient presents with   Code Stroke       Brief summary   61 year old with past medical history significant for hypertension, history of DVT admitted 9/28 as a code stroke, admitted to ICU with working diagnosis of intracranial hemorrhage right MCA ruptured aneurysm, has had prolonged hospitalization including multiple procedures, work-up including right ICA coil embolization of right ICA terminus aneurysm on 06-Sep-2022, tracheostomy by CCM on 10/10 and transferred to [facility] on 10/22.   9/28 admitted intracranial hemorrhage right MCA ruptured aneurysm status post CT head. 9/29: MRI brain large acute intraparenchymal hemorrhage extension into the right greater than left lateral ventricle, third ventricle and fourth ventricle. 9/29 developed fever and pneumonia, MSSA and respiratory culture white blood cell up to 56.  Treated. 10/15 transferred to the hospitalist service, transferred back to ICU for worsening respiratory status.  Was placed back on vent. 10/26; IVC filter and PEG tube placed by IR. 10/28: After conference call with family CODE STATUS was changed to DNR/DNI do not placed back on vent support. 11/01: Speech recommend Dysphagia 3 diet.  11/07 started Bactrim to cover for PNA.  11/16: Tracheostomy tube was changed to a #6 Shiley flex cuffless  11/19: trach removed by the patient, assessed by CCM, Dr. 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 -Trach management per CCM, continue routine trach care.  Last seen by CCM on 11/13, no plan for downsizing yet. -On 11/6, patient had low-grade fever, leukocytosis, sputum cultures positive for Staph aureus, was placed on Bactrim.  Due to rising creatinine, Bactrim was changed over to meropenem on 11/11, has completed course of antibiotics. -Tracheostomy tube changed to #6 Shiley flex cuffless on 11/16 -Self decannulated on 11/19, RT was unable to place trach back, was recommended to observe without trach by Dr. Tonia Brooms -Currently no acute issues  Acute kidney injury -Creatinine plateaued at 1.78 on 11/11, was placed on IV fluids, ACE inhibitor held -Creatinine has improved, back to baseline, 0.9 -Continue to hold lisinopril    Dysphagia Status post PEG tube placed by IR 10/26, was placed on tube feedings -Has made significant progress with oral intake, speech therapy recommended dysphagia 3 diet on 11/1 -SLP evaluation done, recommended to continue dysphagia 3 diet  Bilateral DVTs - Not a candidate for anticoagulation due to ICH - status post IVC filter in 09/29/2022 by IR.   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  Has completed antibiotics course. -Now has remained between 50-55 consulted hem-onc. D/w Dr Pamelia Hoit, ordered flow cytometry and CLL FISH panel per recommendations, will evaluate   Diabetes mellitus type 2  Recent Labs    10/24/22 0614 10/24/22 1155 10/24/22 1640 10/24/22 2112  10/25/22 0606 10/25/22 1201  GLUCAP 114* 109* 92 110* 106* 106*   - CBGs controlled, continue sliding scale insulin   Fall on 11/13, again on 11/18 No obvious injuries noted.     Thrombocytopenia: Resolved Nonsustained V. tach: Continue with metoprolol.  Oral thrush: Received Diflucan 11/6 for 5 days.    Obesity Class 3  Estimated body mass index is 37.02 kg/m as calculated from the following:   Height as of this encounter: 5\' 7"  (1.702 m).   Weight as of this encounter: 107.2 kg.  Code Status: DNR DVT Prophylaxis:  heparin injection 5,000 Units Start: 09/03/22 1400 SCD's Start: Sep 15, 2022 0930   Level of Care: Level of care: Med-Surg Family Communication: Updated patient Disposition Plan:      Remains inpatient appropriate: Difficult to place.  Janina Mayo out, hopefully will be able to place now   Procedures:   Consultants:   PCCM  Antimicrobials:   Anti-infectives (From admission, onward)    Start     Dose/Rate Route Frequency Ordered Stop   10/16/22 0900  meropenem (MERREM) 1 g in sodium chloride 0.9 % 100 mL IVPB        1 g 200 mL/hr over 30 Minutes Intravenous Every 8 hours 10/16/22 0803 10/18/22 0700   10/15/22 1000  meropenem (MERREM) 1 g in sodium chloride 0.9 % 100 mL IVPB  Status:  Discontinued        1 g 200 mL/hr over 30 Minutes Intravenous Every 12 hours 10/15/22 0842 10/16/22 0803   10/11/22 1200  sulfamethoxazole-trimethoprim (BACTRIM DS) 800-160 MG per tablet 1 tablet  Status:  Discontinued        1 tablet Oral Every 12 hours 10/11/22 1106 10/15/22 0842   10/10/22 1000  fluconazole (DIFLUCAN) 40 MG/ML suspension 100 mg        100 mg Per Tube Daily 10/10/22 0835 10/14/22 1003   10/05/22 1415  fluconazole (DIFLUCAN) 40 MG/ML suspension 100 mg  Status:  Discontinued        100 mg Oral Daily 10/05/22 1319 10/10/22 0835   09/29/22 1445  ceFAZolin (ANCEF) IVPB 2g/100 mL premix  Status:  Discontinued        2 g 200 mL/hr over 30 Minutes Intravenous  Once  09/29/22 1346 10/12/22 1148   09/29/22 1145  ceFAZolin (ANCEF) IVPB 1 g/50 mL premix  Status:  Discontinued        1 g 100 mL/hr over 30 Minutes Intravenous To Radiology 09/29/22 1057 09/29/22 1059   09/29/22 1145  ceFAZolin (ANCEF) IVPB 2g/100 mL premix        2 g 200 mL/hr over 30 Minutes Intravenous  Once 09/29/22 1059 09/29/22 1159   09/14/22 1400  ceFEPIme (MAXIPIME) 2 g in sodium chloride 0.9 % 100 mL IVPB        2 g 200 mL/hr over 30 Minutes Intravenous Every 8 hours 09/14/22 1312 09/21/22 0488  09/10/22 0830  ceFAZolin (ANCEF) IVPB 1 g/50 mL premix  Status:  Discontinued        1 g 100 mL/hr over 30 Minutes Intravenous Every 8 hours 09/10/22 0738 09/10/22 0821   09/10/22 0830  ceFAZolin (ANCEF) IVPB 2g/100 mL premix  Status:  Discontinued        2 g 200 mL/hr over 30 Minutes Intravenous Every 8 hours 09/10/22 0821 09/14/22 1312   09/07/22 0815  ceFAZolin (ANCEF) IVPB 2g/100 mL premix        2 g 200 mL/hr over 30 Minutes Intravenous Every 8 hours 09/07/22 0807 09/08/22 1742   09/04/22 2200  Ampicillin-Sulbactam (UNASYN) 3 g in sodium chloride 0.9 % 100 mL IVPB  Status:  Discontinued        3 g 200 mL/hr over 30 Minutes Intravenous Every 24 hours 09/04/22 0722 09/04/22 1506   09/04/22 2200  ceFAZolin (ANCEF) IVPB 1 g/50 mL premix  Status:  Discontinued        1 g 100 mL/hr over 30 Minutes Intravenous Every 12 hours 09/04/22 1506 09/07/22 0807   09/02/22 1030  Ampicillin-Sulbactam (UNASYN) 3 g in sodium chloride 0.9 % 100 mL IVPB  Status:  Discontinued        3 g 200 mL/hr over 30 Minutes Intravenous Every 6 hours 09/02/22 1009 09/04/22 0722          Medications  amLODipine  10 mg Per Tube Daily   arformoterol  15 mcg Nebulization BID   famotidine  20 mg Per Tube Daily   feeding supplement (JEVITY 1.5 CAL/FIBER)  237 mL Per Tube TID PC   fiber  1 packet Per Tube BID   free water  200 mL Per Tube Q8H   heparin injection (subcutaneous)  5,000 Units Subcutaneous Q8H    hydrALAZINE  10 mg Per Tube BID   insulin aspart  0-15 Units Subcutaneous TID WC   metoprolol tartrate  50 mg Per Tube BID   mouth rinse  15 mL Mouth Rinse BID   revefenacin  175 mcg Nebulization Daily   saccharomyces boulardii  250 mg Per Tube BID      Subjective:   Dustin Jones was seen and examined today.  Self decannulated on 11/19, has been doing well, no acute issues.    Objective:   Vitals:   10/25/22 0756 10/25/22 0849 10/25/22 1030 10/25/22 1205  BP: (!) 140/73  124/88 (!) 136/92  Pulse: 61   82  Resp: 17   16  Temp: 98.4 F (36.9 C)   (!) 97.4 F (36.3 C)  TempSrc: Oral   Oral  SpO2: 95% 94%  99%  Weight:      Height:        Intake/Output Summary (Last 24 hours) at 10/25/2022 1220 Last data filed at 10/24/2022 2132 Gross per 24 hour  Intake --  Output 1300 ml  Net -1300 ml     Wt Readings from Last 3 Encounters:  10/25/22 107.2 kg   Physical Exam General: Alert and oriented, dressing intact on trach site Cardiovascular: S1 S2 clear, RRR.  Respiratory: CTAB, no wheezing, rales or rhonchi Gastrointestinal: Soft, nontender, nondistended, NBS, PEG tube Ext: no pedal edema bilaterally Neuro: left-sided hemiparesis    Data Reviewed:  I have personally reviewed following labs    CBC Lab Results  Component Value Date   WBC 53.7 (HH) 10/25/2022   RBC 4.15 (L) 10/25/2022   HGB 12.2 (L) 10/25/2022   HCT 38.4 (L) 10/25/2022  MCV 92.5 10/25/2022   MCH 29.4 10/25/2022   PLT 188 10/25/2022   MCHC 31.8 10/25/2022   RDW 13.0 10/25/2022   LYMPHSABS 45.5 (H) 10/25/2022   MONOABS 4.5 (H) 10/25/2022   EOSABS 0.4 10/25/2022   BASOSABS 0.2 (H) [id number]     Last metabolic panel Lab Results  Component Value Date   NA 140 10/24/2022   K 3.8 10/24/2022   CL 105 10/24/2022   CO2 24 10/24/2022   BUN 18 10/24/2022   CREATININE 1.12 10/24/2022   GLUCOSE 115 (H) 10/24/2022   GFRNONAA >60 10/24/2022   CALCIUM 9.3 10/24/2022   PHOS 4.1 09/28/2022    PROT 7.5 09/29/2022   ALBUMIN 3.1 (L) 09/29/2022   BILITOT 0.6 09/29/2022   ALKPHOS 72 09/29/2022   AST 29 09/29/2022   ALT 40 09/29/2022   ANIONGAP 11 10/24/2022    CBG (last 3)  Recent Labs    10/24/22 2112 10/25/22 0606 10/25/22 1201  GLUCAP 110* 106* 106*       Radiology Studies: I have personally reviewed the imaging studies  No results found.     Estill Cotta M.D. [facility] 10/25/2022, 12:20 PM  Available via Epic secure chat 7am-7pm After 7 pm, please refer to night coverage provider listed on amion.

## 2022-10-26 LAB — GLUCOSE, CAPILLARY
Glucose-Capillary: 128 mg/dL — ABNORMAL HIGH (ref 70–99)
Glucose-Capillary: 115 mg/dL — ABNORMAL HIGH (ref 70–99)
Glucose-Capillary: 163 mg/dL — ABNORMAL HIGH (ref 70–99)
Glucose-Capillary: 126 mg/dL — ABNORMAL HIGH (ref 70–99)

## 2022-10-26 NOTE — TOC Progression Note (Signed)
Transition of Care [facility]) - Progression Note    Patient Details  Name: Dustin Jones MRN: [id number] Date of Birth: 1961-10-15  [facility] CM/SW Contact  Carley Hammed, [place] Phone Number: 10/26/2022, 3:51 PM  Clinical Narrative:     TOC continues to monitor and provide updates to facility in [employer] as able. Acceptance pending. Medicaid pending. [facility] to do a bed assessment on Friday to see if pt would be appropriate for any of their facilities. TOC will continue to follow for DC needs.       Expected Discharge Plan and Services                                                 Social Determinants of Health (SDOH) Interventions    Readmission Risk Interventions     No data to display

## 2022-10-26 NOTE — Progress Notes (Signed)
[facility]                                                                              Dustin Jones, is a 61 y.o. male, DOB - [birth year], [id number] Admit date - 08/14/2022    Outpatient Primary MD for the patient is [name], [name], NP  LOS - 39  days  Chief Complaint  Patient presents with   Code Stroke       Brief summary   61 year old with past medical history significant for hypertension, history of DVT admitted 9/28 as a code stroke, admitted to ICU with working diagnosis of intracranial hemorrhage right MCA ruptured aneurysm, has had prolonged hospitalization including multiple procedures, work-up including right ICA coil embolization of right ICA terminus aneurysm on 08/14/2022, tracheostomy by CCM on 10/10 and transferred to [facility] on 10/22.   9/28 admitted intracranial hemorrhage right MCA ruptured aneurysm status post CT head. 9/29: MRI brain large acute intraparenchymal hemorrhage extension into the right greater than left lateral ventricle, third ventricle and fourth ventricle. 9/29 developed fever and pneumonia, MSSA and respiratory culture white blood cell up to 56.  Treated. 10/15 transferred to the hospitalist service, transferred back to ICU for worsening respiratory status.  Was placed back on vent. 10/26; IVC filter and PEG tube placed by IR. 10/28: After conference call with family CODE STATUS was changed to DNR/DNI do not placed back on vent support. 11/01: Speech recommend Dysphagia 3 diet.  11/07 started Bactrim to cover for PNA.  11/16: Tracheostomy tube was changed to a #6 Shiley flex cuffless  11/19: trach removed by the patient, assessed by CCM, Dr. 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 -Patient was followed by CCM for trach management -On 11/6, patient had low-grade fever, leukocytosis, sputum cultures positive for Staph aureus, was placed on Bactrim.  Due to rising creatinine, Bactrim was changed over to meropenem on 11/11, has completed course of antibiotics. -Tracheostomy tube changed to #6 Shiley flex cuffless on 11/16 -Self decannulated on 11/19, RT was unable to place trach back, was recommended to observe without trach by Dr. Valeta Harms, so far tolerating -No acute issues  Acute kidney injury -Creatinine plateaued at 1.78 on 11/11, was placed on IV fluids, ACE inhibitor held -Creatinine has improved, back to baseline, 0.9 -Continue to hold lisinopril    Dysphagia Status post PEG tube placed by IR 10/26, was placed on tube feedings -Has made significant progress with oral intake, speech therapy recommended dysphagia 3 diet on 11/1 -Tolerating dysphagia 3 diet during the day, on nocturnal tube feeds   Bilateral DVTs - Not a candidate for anticoagulation due  to ICH - status post IVC filter in 09/29/2022 by IR.   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  Has completed antibiotics course. -Now has remained between 50-55 consulted hem-onc. D/w Dr Pamelia Hoit, ordered flow cytometry and CLL FISH panel per recommendations, will evaluate   Diabetes mellitus type 2 Recent Labs    10/25/22 0606 10/25/22 1201 10/25/22 1636 10/25/22 2111 10/26/22 0606 10/26/22 1133  GLUCAP 106* 106* 101* 131* 128* 115*      -Continue sliding scale, CBG stable   Fall on 11/13, again on 11/18 No obvious injuries noted.     Thrombocytopenia: Resolved Nonsustained V. tach: Continue with metoprolol.  Oral thrush: Received Diflucan 11/6 for 5 days.    Obesity Class 3  Estimated body mass index is 37.08 kg/m as calculated from the following:   Height as of this encounter: 5\' 7"  (1.702 m).   Weight as of this encounter: 107.4 kg.  Code Status: DNR DVT Prophylaxis:  heparin injection 5,000 Units Start: 09/03/22 1400 SCD's Start: 09-04-2022 0930   Level of Care: Level of care: Med-Surg Family Communication: Updated patient Disposition Plan:      Remains inpatient appropriate: Difficult to place.  Janina Mayo out, hopefully will be able to place now Consultants:   PCCM  Antimicrobials:   Anti-infectives (From admission, onward)    Start     Dose/Rate Route Frequency Ordered Stop   10/16/22 0900  meropenem (MERREM) 1 g in sodium chloride 0.9 % 100 mL IVPB        1 g 200 mL/hr over 30 Minutes Intravenous Every 8 hours 10/16/22 0803 10/18/22 0700   10/15/22 1000  meropenem (MERREM) 1 g in sodium chloride 0.9 % 100 mL IVPB  Status:  Discontinued        1 g 200 mL/hr over 30 Minutes Intravenous Every 12 hours 10/15/22 0842 10/16/22 0803   10/11/22 1200  sulfamethoxazole-trimethoprim (BACTRIM DS) 800-160 MG per tablet 1 tablet  Status:  Discontinued        1 tablet Oral Every 12 hours 10/11/22 1106 10/15/22 0842   10/10/22 1000  fluconazole (DIFLUCAN) 40 MG/ML suspension 100 mg        100 mg Per Tube Daily 10/10/22 0835 10/14/22 1003   10/05/22 1415  fluconazole (DIFLUCAN) 40 MG/ML suspension 100 mg  Status:  Discontinued        100 mg Oral Daily 10/05/22 1319 10/10/22 0835   09/29/22 1445  ceFAZolin (ANCEF) IVPB 2g/100 mL premix  Status:  Discontinued        2 g 200 mL/hr over 30 Minutes Intravenous  Once 09/29/22 1346 10/12/22 1148   09/29/22 1145  ceFAZolin (ANCEF) IVPB 1 g/50 mL premix  Status:  Discontinued         1 g 100 mL/hr over 30 Minutes Intravenous To Radiology 09/29/22 1057 09/29/22 1059   09/29/22 1145  ceFAZolin (ANCEF) IVPB 2g/100 mL premix        2 g 200 mL/hr over 30 Minutes Intravenous  Once 09/29/22 1059 09/29/22 1159   09/14/22 1400  ceFEPIme (MAXIPIME) 2 g in sodium chloride 0.9 % 100 mL IVPB        2 g 200 mL/hr over 30 Minutes Intravenous Every 8 hours 09/14/22 1312 09/21/22 0922   09/10/22 0830  ceFAZolin (ANCEF) IVPB 1 g/50 mL premix  Status:  Discontinued  1 g 100 mL/hr over 30 Minutes Intravenous Every 8 hours 09/10/22 0738 09/10/22 0821   09/10/22 0830  ceFAZolin (ANCEF) IVPB 2g/100 mL premix  Status:  Discontinued        2 g 200 mL/hr over 30 Minutes Intravenous Every 8 hours 09/10/22 0821 09/14/22 1312   09/07/22 0815  ceFAZolin (ANCEF) IVPB 2g/100 mL premix        2 g 200 mL/hr over 30 Minutes Intravenous Every 8 hours 09/07/22 0807 09/08/22 1742   09/04/22 2200  Ampicillin-Sulbactam (UNASYN) 3 g in sodium chloride 0.9 % 100 mL IVPB  Status:  Discontinued        3 g 200 mL/hr over 30 Minutes Intravenous Every 24 hours 09/04/22 0722 09/04/22 1506   09/04/22 2200  ceFAZolin (ANCEF) IVPB 1 g/50 mL premix  Status:  Discontinued        1 g 100 mL/hr over 30 Minutes Intravenous Every 12 hours 09/04/22 1506 09/07/22 0807   09/02/22 1030  Ampicillin-Sulbactam (UNASYN) 3 g in sodium chloride 0.9 % 100 mL IVPB  Status:  Discontinued        3 g 200 mL/hr over 30 Minutes Intravenous Every 6 hours 09/02/22 1009 09/04/22 0722          Medications  amLODipine  10 mg Per Tube Daily   arformoterol  15 mcg Nebulization BID   famotidine  20 mg Per Tube Daily   feeding supplement (JEVITY 1.5 CAL/FIBER)  1,000 mL Per Tube Q24H   free water  150 mL Per Tube Q4H   heparin injection (subcutaneous)  5,000 Units Subcutaneous Q8H   hydrALAZINE  10 mg Per Tube BID   insulin aspart  0-15 Units Subcutaneous TID WC   metoprolol tartrate  50 mg Per Tube BID   mouth rinse  15  mL Mouth Rinse BID   revefenacin  175 mcg Nebulization Daily   saccharomyces boulardii  250 mg Per Tube BID      Subjective:   Dustin Jones was seen and examined today.  Self decannulated on 11/19, has been tolerating so far.  No acute issues.    Objective:   Vitals:   10/26/22 0746 10/26/22 0910 10/26/22 0913 10/26/22 1132  BP: (!) 142/90   (!) 111/99  Pulse: 91   67  Resp: 18   14  Temp: 98.8 F (37.1 C)   99 F (37.2 C)  TempSrc: Oral   Oral  SpO2: 95% 91% 91% 95%  Weight:      Height:        Intake/Output Summary (Last 24 hours) at 10/26/2022 1158 Last data filed at 10/26/2022 0825 Gross per 24 hour  Intake 2025 ml  Output 800 ml  Net 1225 ml     Wt Readings from Last 3 Encounters:  10/26/22 107.4 kg    Physical Exam General: Alert and oriented, NAD, dressing intact on trach site Cardiovascular: S1 S2 clear, RRR.  Respiratory: CTAB, no wheezing Gastrointestinal: Soft, nontender, nondistended, NBS, PEG tube Ext: no pedal edema bilaterally Neuro: left-sided hemiparesis   Data Reviewed:  I have personally reviewed following labs    CBC Lab Results  Component Value Date   WBC 53.7 (HH) 10/25/2022   RBC 4.15 (L) 10/25/2022   HGB 12.2 (L) 10/25/2022   HCT 38.4 (L) 10/25/2022   MCV 92.5 10/25/2022   MCH 29.4 10/25/2022   PLT 188 10/25/2022   MCHC 31.8 10/25/2022   RDW 13.0 10/25/2022   LYMPHSABS 45.5 (H) 10/25/2022  MONOABS 4.5 (H) 10/25/2022   EOSABS 0.4 10/25/2022   BASOSABS 0.2 (H) 10/25/2022     Last metabolic panel Lab Results  Component Value Date   NA 140 10/24/2022   K 3.8 10/24/2022   CL 105 10/24/2022   CO2 24 10/24/2022   BUN 18 10/24/2022   CREATININE 1.12 10/24/2022   GLUCOSE 115 (H) 10/24/2022   GFRNONAA >60 10/24/2022   CALCIUM 9.3 10/24/2022   PHOS 4.1 09/28/2022   PROT 7.5 09/29/2022   ALBUMIN 3.1 (L) 09/29/2022   BILITOT 0.6 09/29/2022   ALKPHOS 72 09/29/2022   AST 29 09/29/2022   ALT 40 09/29/2022   ANIONGAP  11 10/24/2022    CBG (last 3)  Recent Labs    10/25/22 2111 10/26/22 0606 10/26/22 1133  GLUCAP 131* 128* 115*       Radiology Studies: I have personally reviewed the imaging studies  No results found.     Thad Ranger M.D. [facility] 10/26/2022, 11:58 AM  Available via Epic secure chat 7am-7pm After 7 pm, please refer to night coverage provider listed on amion.

## 2022-10-26 NOTE — Progress Notes (Signed)
Physical Therapy Treatment Patient Details Name: Dustin Jones MRN: [id number] DOB: Jun 10, 1961 Today's Date: 10/26/2022   History of Present Illness The pt is a 61 y.o. unidentified homeless male presenting 9/28 after being found down by bystanders. 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 S/P coil embolization of R ICA aneurysm 9/28. Intubated 9/29. Trach placement 10/10.  Back to ICU 10/15 for respiratory failure, had IVC filter placed 10/26 and PEG.  No PMH on file.    PT Comments    Session focused on LE strengthening, balance, and using RW as stabilizer with support to left hand to maintain grip as needed. Will try walker splint with RW next visit.    Recommendations for follow up therapy are one component of a multi-disciplinary discharge planning process, led by the attending physician.  Recommendations may be updated based on patient status, additional functional criteria and insurance authorization.  Follow Up Recommendations  Skilled nursing-short term rehab (<3 hours/day) Can patient physically be transported by private vehicle: No   Assistance Recommended at Discharge Frequent or constant Supervision/Assistance  Patient can return home with the following Two people to help with walking and/or transfers;Two people to help with bathing/dressing/bathroom;Assistance with cooking/housework;Assistance with feeding;Direct supervision/assist for medications management;Direct supervision/assist for financial management;Help with stairs or ramp for entrance;Assist for transportation   Equipment Recommendations  Other (comment) (TBD)    Recommendations for Other Services       Precautions / Restrictions Precautions Precautions: Fall Precaution Comments: PEG Restrictions Weight Bearing Restrictions: No     Mobility  Bed Mobility               General bed  mobility comments: up in chair on arrival    Transfers Overall transfer level: Needs assistance Equipment used: Rolling walker (2 wheels) Transfers: Sit to/from Stand Sit to Stand: Min assist           General transfer comment: verbal and tactile cues to push up to stand; esp cues for use of LUE    Ambulation/Gait               General Gait Details: focused on strengthening--deferred   Stairs             Wheelchair Mobility    Modified Rankin (Stroke Patients Only) Modified Rankin (Stroke Patients Only) Pre-Morbid Rankin Score: No symptoms Modified Rankin: Moderately severe disability     Balance Overall balance assessment: Needs assistance Sitting-balance support: Feet supported Sitting balance-Leahy Scale: Fair Sitting balance - Comments: EOB with feet supported   Standing balance support: Bilateral upper extremity supported Standing balance-Leahy Scale: Poor Standing balance comment: stood with bil UE support on RW without physical assist, but needs UE support                            Cognition Arousal/Alertness: Awake/alert Behavior During Therapy: Flat affect Overall Cognitive Status: Impaired/Different from baseline Area of Impairment: Following commands, Problem solving, Awareness, Safety/judgement                       Following Commands: Follows one step commands with increased time Safety/Judgement: Decreased awareness of deficits   Problem Solving: Slow processing, Requires verbal cues General Comments: required increased time to follow commands and tactile cues to use LUE in functional way on RW        Exercises General Exercises -  Lower Extremity Ankle Circles/Pumps: AROM, Both, 5 reps Long Arc Quad: AROM, Right, Seated, Left, 10 reps (with 3 sec hold) Hip Flexion/Marching: AROM, Right, Left, 10 reps, Standing (holding onto RW) Mini-Sqauts: AROM, Both, 10 reps (holding onto RW with support to maintain Lt  hand) Other Exercises Other Exercises: STSx 10 with left foot slightly posterior to right foot to emphasize use of LLE    General Comments        Pertinent Vitals/Pain Pain Assessment Pain Assessment: Faces Faces Pain Scale: No hurt    Home Living                          Prior Function            PT Goals (current goals can now be found in the care plan section) Acute Rehab PT Goals Patient Stated Goal: agrees to work on LLE strength Time For Goal Achievement: 11/03/22 Potential to Achieve Goals: Good Progress towards PT goals: Progressing toward goals    Frequency    Min 2X/week      PT Plan Current plan remains appropriate    Co-evaluation              AM-PAC PT "6 Clicks" Mobility   Outcome Measure  Help needed turning from your back to your side while in a flat bed without using bedrails?: A Little Help needed moving from lying on your back to sitting on the side of a flat bed without using bedrails?: A Little Help needed moving to and from a bed to a chair (including a wheelchair)?: A Little Help needed standing up from a chair using your arms (e.g., wheelchair or bedside chair)?: A Little Help needed to walk in hospital room?: Total Help needed climbing 3-5 steps with a railing? : Total 6 Click Score: 14    End of Session Equipment Utilized During Treatment: Gait belt Activity Tolerance: Patient tolerated treatment well Patient left: with call bell/phone within reach;in chair;with chair alarm set Nurse Communication: Mobility status PT Visit Diagnosis: Other abnormalities of gait and mobility (R26.89);Hemiplegia and hemiparesis;Other symptoms and signs involving the nervous system (R29.898);Muscle weakness (generalized) (M62.81) Hemiplegia - Right/Left: Left Hemiplegia - dominant/non-dominant: Non-dominant Hemiplegia - caused by: Nontraumatic intracerebral hemorrhage     Time: [id number] PT Time Calculation (min) (ACUTE ONLY): 23  min  Charges:  $Therapeutic Exercise: 23-37 mins                      Arby Barrette, PT Acute Rehabilitation Services  Office [phone]    Rexanne Mano 10/26/2022, 11:40 AM

## 2022-10-26 NOTE — Plan of Care (Signed)
  Problem: Education: Goal: Knowledge of General Education information will improve Description: Including pain rating scale, medication(s)/side effects and non-pharmacologic comfort measures Outcome: Progressing   Problem: Health Behavior/Discharge Planning: Goal: Ability to manage health-related needs will improve Outcome: Progressing   Problem: Clinical Measurements: Goal: Ability to maintain clinical measurements within normal limits will improve Outcome: Progressing Goal: Will remain free from infection Outcome: Progressing Goal: Diagnostic test results will improve Outcome: Progressing Goal: Respiratory complications will improve Outcome: Progressing Goal: Cardiovascular complication will be avoided Outcome: Progressing   Problem: Activity: Goal: Risk for activity intolerance will decrease Outcome: Progressing   Problem: Nutrition: Goal: Adequate nutrition will be maintained Outcome: Progressing   Problem: Coping: Goal: Level of anxiety will decrease Outcome: Progressing   Problem: Elimination: Goal: Will not experience complications related to bowel motility Outcome: Progressing Goal: Will not experience complications related to urinary retention Outcome: Progressing   Problem: Pain Managment: Goal: General experience of comfort will improve Outcome: Progressing   Problem: Safety: Goal: Ability to remain free from injury will improve Outcome: Progressing   Problem: Skin Integrity: Goal: Risk for impaired skin integrity will decrease Outcome: Progressing   Problem: Education: Goal: Ability to describe self-care measures that may prevent or decrease complications (Diabetes Survival Skills Education) will improve Outcome: Progressing Goal: Individualized Educational Video(s) Outcome: Progressing   Problem: Coping: Goal: Ability to adjust to condition or change in health will improve Outcome: Progressing   Problem: Fluid Volume: Goal: Ability to  maintain a balanced intake and output will improve Outcome: Progressing   Problem: Health Behavior/Discharge Planning: Goal: Ability to identify and utilize available resources and services will improve Outcome: Progressing Goal: Ability to manage health-related needs will improve Outcome: Progressing   Problem: Metabolic: Goal: Ability to maintain appropriate glucose levels will improve Outcome: Progressing   Problem: Nutritional: Goal: Maintenance of adequate nutrition will improve Outcome: Progressing Goal: Progress toward achieving an optimal weight will improve Outcome: Progressing   Problem: Skin Integrity: Goal: Risk for impaired skin integrity will decrease Outcome: Progressing   Problem: Tissue Perfusion: Goal: Adequacy of tissue perfusion will improve Outcome: Progressing   Problem: Education: Goal: Knowledge of disease or condition will improve Outcome: Progressing Goal: Knowledge of secondary prevention will improve (SELECT ALL) Outcome: Progressing Goal: Knowledge of patient specific risk factors will improve (INDIVIDUALIZE FOR PATIENT) Outcome: Progressing Goal: Individualized Educational Video(s) Outcome: Progressing   Problem: Health Behavior/Discharge Planning: Goal: Ability to manage health-related needs will improve Outcome: Progressing   Problem: Education: Goal: Knowledge about tracheostomy care/management will improve Outcome: Progressing   Problem: Activity: Goal: Ability to tolerate increased activity will improve Outcome: Progressing   Problem: Health Behavior/Discharge Planning: Goal: Ability to manage tracheostomy will improve Outcome: Progressing   Problem: Respiratory: Goal: Patent airway maintenance will improve Outcome: Progressing   Problem: Role Relationship: Goal: Ability to communicate will improve Outcome: Progressing   Problem: Education: Goal: Knowledge of secondary prevention will improve (SELECT ALL) Outcome:  Progressing Goal: Knowledge of patient specific risk factors will improve (INDIVIDUALIZE FOR PATIENT) Outcome: Progressing   Problem: Intracerebral Hemorrhage Tissue Perfusion: Goal: Complications of Intracerebral Hemorrhage will be minimized Outcome: Progressing

## 2022-10-26 NOTE — Plan of Care (Signed)
  Problem: Clinical Measurements: Goal: Diagnostic test results will improve Outcome: Progressing Goal: Respiratory complications will improve Outcome: Progressing Goal: Cardiovascular complication will be avoided Outcome: Progressing   Problem: Activity: Goal: Risk for activity intolerance will decrease Outcome: Progressing   Problem: Nutrition: Goal: Adequate nutrition will be maintained Outcome: Progressing   Problem: Safety: Goal: Ability to remain free from injury will improve Outcome: Progressing   

## 2022-10-27 LAB — CBC WITH DIFFERENTIAL/PLATELET
Abs Immature Granulocytes: 0 10*3/uL (ref 0.00–0.07)
Basophils Absolute: 1.5 10*3/uL — ABNORMAL HIGH (ref 0.0–0.1)
Basophils Relative: 3 %
Eosinophils Absolute: 1 10*3/uL — ABNORMAL HIGH (ref 0.0–0.5)
Eosinophils Relative: 2 %
HCT: 36.3 % — ABNORMAL LOW (ref 39.0–52.0)
Hemoglobin: 11.6 g/dL — ABNORMAL LOW (ref 13.0–17.0)
Lymphocytes Relative: 80 %
Lymphs Abs: 39.1 10*3/uL — ABNORMAL HIGH (ref 0.7–4.0)
MCH: 29.3 pg (ref 26.0–34.0)
MCHC: 32 g/dL (ref 30.0–36.0)
MCV: 91.7 fL (ref 80.0–100.0)
Monocytes Absolute: 0 10*3/uL — ABNORMAL LOW (ref 0.1–1.0)
Monocytes Relative: 0 %
Neutro Abs: 7.3 10*3/uL (ref 1.7–7.7)
Neutrophils Relative %: 15 %
Platelets: 182 10*3/uL (ref 150–400)
RBC: 3.96 MIL/uL — ABNORMAL LOW (ref 4.22–5.81)
RDW: 13.1 % (ref 11.5–15.5)
WBC: 48.9 10*3/uL — ABNORMAL HIGH (ref 4.0–10.5)
nRBC: 0 % (ref 0.0–0.2)
nRBC: 0 /100 WBC

## 2022-10-27 LAB — COMPREHENSIVE METABOLIC PANEL
ALT: 37 U/L (ref 0–44)
AST: 27 U/L (ref 15–41)
Albumin: 3.2 g/dL — ABNORMAL LOW (ref 3.5–5.0)
Alkaline Phosphatase: 53 U/L (ref 38–126)
Anion gap: 14 (ref 5–15)
BUN: 18 mg/dL (ref 8–23)
CO2: 24 mmol/L (ref 22–32)
Calcium: 9.6 mg/dL (ref 8.9–10.3)
Chloride: 102 mmol/L (ref 98–111)
Creatinine, Ser: 1.02 mg/dL (ref 0.61–1.24)
GFR, Estimated: >60 mL/min (ref >60)
Glucose, Bld: 129 mg/dL — ABNORMAL HIGH (ref 70–99)
Potassium: 3.7 mmol/L (ref 3.5–5.1)
Sodium: 140 mmol/L (ref 135–145)
Total Bilirubin: 0.5 mg/dL (ref 0.3–1.2)
Total Protein: 6.5 g/dL (ref 6.5–8.1)

## 2022-10-27 LAB — GLUCOSE, CAPILLARY
Glucose-Capillary: 124 mg/dL — ABNORMAL HIGH (ref 70–99)
Glucose-Capillary: 153 mg/dL — ABNORMAL HIGH (ref 70–99)
Glucose-Capillary: 102 mg/dL — ABNORMAL HIGH (ref 70–99)
Glucose-Capillary: 115 mg/dL — ABNORMAL HIGH (ref 70–99)
Glucose-Capillary: 105 mg/dL — ABNORMAL HIGH (ref 70–99)

## 2022-10-27 NOTE — Progress Notes (Signed)
Mobility Specialist Progress Note   10/27/22 1225  Mobility  Activity Refused mobility   Patient deferred ambulation despite encouragement. Will f/u as time permits.  [place] Saliou Barnier, BS EXP Mobility Specialist Please contact via SecureChat or Rehab office at [phone]

## 2022-10-27 NOTE — Progress Notes (Signed)
[facility] PROGRESS NOTE   Bjorn Cyndi Lennert BX:[id number] DOB: [birth year] DOA: 08/28/2022  PCP: Marliss Coots, NP  Brief History/Interval Summary: 61 year old with past medical history significant for hypertension, history of DVT admitted 9/28 as a code stroke, admitted to ICU with working diagnosis of intracranial hemorrhage right MCA ruptured aneurysm, has had prolonged hospitalization including multiple procedures, work-up including right ICA coil embolization of right ICA terminus aneurysm on 08/28/2022, tracheostomy by CCM on 10/10 and transferred to [facility] on 10/22.   9/28 admitted intracranial hemorrhage right MCA ruptured aneurysm status post CT head. 9/29: MRI brain large acute intraparenchymal hemorrhage extension into the right greater than left lateral ventricle, third ventricle and fourth ventricle. 9/29 developed fever and pneumonia, MSSA and respiratory culture white blood cell up to 56.  Treated. 10/15 transferred to the hospitalist service, transferred back to ICU for worsening respiratory status.  Was placed back on vent. 10/26; IVC filter and PEG tube placed by IR. 10/28: After conference call with family CODE STATUS was changed to DNR/DNI do not placed back on vent support. 11/01: Speech recommend Dysphagia 3 diet.  11/07 started Bactrim to cover for PNA.  11/16: Tracheostomy tube was changed to a #6 Shiley flex cuffless  11/19: trach removed by the patient, assessed by CCM, Dr. Valeta Harms, patient able to cough and clear his throat, phonate, recommended to observe without trach  Consultants: Critical care medicine   Subjective/Interval History: Patient denies any complaints this morning.  Still has weakness in the left arm and leg.  Denies any shortness of breath.  No nausea or vomiting.  Tolerating his diet.   Assessment/Plan:  ICH (intracerebral hemorrhage) ([place]), right MCA ruptured aneurysm -Status post right ICA coil embolization of the right ICA terminus  aneurysm on 08/09/2022 -Persisting left-sided hemiparesis, no acute issues Continue with physical and Occupational Therapy.   Acute respiratory failure with hypoxia secondary to pneumonia Pneumonia due to MSSA and Enterobacter Underwent tracheostomy by critical care medicine. -Has completed multiple courses of antibiotics. -Patient was followed by CCM for trach management -On 11/6, patient had low-grade fever, leukocytosis, sputum cultures positive for Staph aureus, was placed on Bactrim.  Due to rising creatinine, Bactrim was changed over to meropenem on 11/11, has completed course of antibiotics. -Tracheostomy tube changed to #6 Shiley flex cuffless on 11/16 -Self decannulated on 11/19, RT was unable to place trach back, was recommended to observe without trach by Dr. Valeta Harms. Seems to be doing well without tracheostomy.  Respiratory status is stable.   Acute kidney injury -Creatinine plateaued at 1.78 on 11/11, was placed on IV fluids, ACE inhibitor held -Creatinine has improved, back to baseline, 0.9 -Continue to hold lisinopril   Dysphagia Status post PEG tube placed by IR 10/26, was placed on tube feedings -Has made significant progress with oral intake, speech therapy recommended dysphagia 3 diet on 11/1 -Tolerating dysphagia 3 diet during the day, on nocturnal tube feeds   Bilateral DVTs - Not a candidate for anticoagulation due to [place] - status post IVC filter in 09/29/2022 by IR.   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  Has completed antibiotics course. -Now has remained between 50-55 Previous rounding MD consulted hem-onc. D/w Dr Lindi Adie, ordered flow cytometry and CLL FISH panel per recommendations. Results pending.  Patient will need follow-up with Dr. Lindi Adie after discharge.   Diabetes mellitus  type 2 -Continue  sliding scale, CBG stable   Fall on 11/13, again on 11/18 No obvious injuries noted.     Thrombocytopenia: Resolved Nonsustained V. tach: Continue with metoprolol.  Oral thrush: Received Diflucan 11/6 for 5 days.    Obesity Class 3  Estimated body mass index is 37.08 kg/m as calculated from the following:   Height as of this encounter: 5\' 7"  (1.702 m).   Weight as of this encounter: 107.4 kg.   DVT Prophylaxis: Subcutaneous heparin Code Status: DNR Family Communication: No family at bedside Disposition Plan: Skilled nursing facility  Status is: Inpatient Remains inpatient appropriate because: Intracranial hemorrhage, dysphagia      Medications: Scheduled:  amLODipine  10 mg Per Tube Daily   arformoterol  15 mcg Nebulization BID   famotidine  20 mg Per Tube Daily   feeding supplement (JEVITY 1.5 CAL/FIBER)  1,000 mL Per Tube Q24H   free water  150 mL Per Tube Q4H   heparin injection (subcutaneous)  5,000 Units Subcutaneous Q8H   hydrALAZINE  10 mg Per Tube BID   insulin aspart  0-15 Units Subcutaneous TID WC   metoprolol tartrate  50 mg Per Tube BID   mouth rinse  15 mL Mouth Rinse BID   revefenacin  175 mcg Nebulization Daily   saccharomyces boulardii  250 mg Per Tube BID   Continuous: **OR** acetaminophen (TYLENOL) oral liquid 160 mg/5 mL **OR** acetaminophen, albuterol, fentaNYL (SUBLIMAZE) injection, mouth rinse, oxyCODONE  Antibiotics: Anti-infectives (From admission, onward)    Start     Dose/Rate Route Frequency Ordered Stop   10/16/22 0900  meropenem (MERREM) 1 g in sodium chloride 0.9 % 100 mL IVPB        1 g 200 mL/hr over 30 Minutes Intravenous Every 8 hours 10/16/22 0803 10/18/22 0700   10/15/22 1000  meropenem (MERREM) 1 g in sodium chloride 0.9 % 100 mL IVPB  Status:  Discontinued        1 g 200 mL/hr over 30 Minutes Intravenous Every 12 hours 10/15/22 0842 10/16/22 0803   10/11/22 1200  sulfamethoxazole-trimethoprim (BACTRIM DS)  800-160 MG per tablet 1 tablet  Status:  Discontinued        1 tablet Oral Every 12 hours 10/11/22 1106 10/15/22 0842   10/10/22 1000  fluconazole (DIFLUCAN) 40 MG/ML suspension 100 mg        100 mg Per Tube Daily 10/10/22 0835 10/14/22 1003   10/05/22 1415  fluconazole (DIFLUCAN) 40 MG/ML suspension 100 mg  Status:  Discontinued        100 mg Oral Daily 10/05/22 1319 10/10/22 0835   09/29/22 1445  ceFAZolin (ANCEF) IVPB 2g/100 mL premix  Status:  Discontinued        2 g 200 mL/hr over 30 Minutes Intravenous  Once 09/29/22 1346 10/12/22 1148   09/29/22 1145  ceFAZolin (ANCEF) IVPB 1 g/50 mL premix  Status:  Discontinued        1 g 100 mL/hr over 30 Minutes Intravenous To Radiology 09/29/22 1057 09/29/22 1059   09/29/22 1145  ceFAZolin (ANCEF) IVPB 2g/100 mL premix        2 g 200 mL/hr over 30 Minutes Intravenous  Once 09/29/22 1059 09/29/22 1159   09/14/22 1400  ceFEPIme (MAXIPIME) 2 g in sodium chloride 0.9 % 100 mL IVPB        2 g 200 mL/hr over 30 Minutes Intravenous Every 8 hours 09/14/22 1312 09/21/22 0922   09/10/22 0830  ceFAZolin (  ANCEF) IVPB 1 g/50 mL premix  Status:  Discontinued        1 g 100 mL/hr over 30 Minutes Intravenous Every 8 hours 09/10/22 0738 09/10/22 0821   09/10/22 0830  ceFAZolin (ANCEF) IVPB 2g/100 mL premix  Status:  Discontinued        2 g 200 mL/hr over 30 Minutes Intravenous Every 8 hours 09/10/22 0821 09/14/22 1312   09/07/22 0815  ceFAZolin (ANCEF) IVPB 2g/100 mL premix        2 g 200 mL/hr over 30 Minutes Intravenous Every 8 hours 09/07/22 0807 09/08/22 1742   09/04/22 2200  Ampicillin-Sulbactam (UNASYN) 3 g in sodium chloride 0.9 % 100 mL IVPB  Status:  Discontinued        3 g 200 mL/hr over 30 Minutes Intravenous Every 24 hours 09/04/22 0722 09/04/22 1506   09/04/22 2200  ceFAZolin (ANCEF) IVPB 1 g/50 mL premix  Status:  Discontinued        1 g 100 mL/hr over 30 Minutes Intravenous Every 12 hours 09/04/22 1506 09/07/22 0807   09/02/22 1030   Ampicillin-Sulbactam (UNASYN) 3 g in sodium chloride 0.9 % 100 mL IVPB  Status:  Discontinued        3 g 200 mL/hr over 30 Minutes Intravenous Every 6 hours 09/02/22 1009 09/04/22 0722       Objective:  Vital Signs  Vitals:   10/27/22 0325 10/27/22 0416 10/27/22 0730 10/27/22 0826  BP: 127/86  127/89   Pulse: 74  75 83  Resp: 18  20 (!) 22  Temp: 97.8 F (36.6 C)  98.7 F (37.1 C)   TempSrc: Oral  Oral   SpO2: 93%  99% 95%  Weight:  107.8 kg    Height:        Intake/Output Summary (Last 24 hours) at 10/27/2022 0912 Last data filed at 10/27/2022 0600 Gross per 24 hour  Intake 2150 ml  Output 1000 ml  Net 1150 ml   Filed Weights   10/25/22 0500 10/26/22 0443 10/27/22 0416  Weight: 107.2 kg 107.4 kg 107.8 kg    General appearance: Awake alert.  In no distress Resp: Clear to auscultation bilaterally.  Normal effort Cardio: S1-S2 is normal regular.  No S3-S4.  No rubs murmurs or bruit GI: Abdomen is soft.  Nontender nondistended.  Bowel sounds are present normal.  No masses organomegaly Extremities: No edema.   Neurologic: Left-sided weakness noted   Lab Results:  Data Reviewed: I have personally reviewed following labs and reports of the imaging studies  CBC: Recent Labs  Lab 10/21/22 0716 10/22/22 0341 10/23/22 0420 10/24/22 0359 10/25/22 0405 10/27/22 0221  WBC 52.7* 54.3* 53.7* 52.4* 53.7* 48.9*  NEUTROABS 5.8 3.8 2.7  --  3.0 7.3  HGB 13.1 12.7* 13.2 12.9* 12.2* 11.6*  HCT 40.4 39.0 40.2 40.2 38.4* 36.3*  MCV 91.8 91.3 91.4 92.0 92.5 91.7  PLT 213 202 196 193 188 [id number]    Basic Metabolic Panel: Recent Labs  Lab 10/22/22 0341 10/24/22 0359 10/27/22 0221  NA 141 140 140  K 3.8 3.8 3.7  CL 105 105 102  CO2 23 24 24   GLUCOSE 107* 115* 129*  BUN 14 18 18   CREATININE 0.83 1.12 1.02  CALCIUM 9.6 9.3 9.6    GFR: Estimated Creatinine Clearance: 89.1 mL/min (by C-G formula based on SCr of 1.02 mg/dL).  Liver Function Tests: Recent Labs  Lab  10/27/22 0221  AST 27  ALT 37  ALKPHOS 53  BILITOT  0.5  PROT 6.5  ALBUMIN 3.2*    CBG: Recent Labs  Lab 10/26/22 1133 10/26/22 1638 10/26/22 2111 10/27/22 0613 10/27/22 0829  GLUCAP 115* 163* 126* 124* 153*     Radiology Studies: No results found.     LOS: 57 days   Odis Turck [employer] on [email or web address]  10/27/2022, 9:12 AM

## 2022-10-27 NOTE — Plan of Care (Signed)
  Problem: Education: Goal: Knowledge of General Education information will improve Description: Including pain rating scale, medication(s)/side effects and non-pharmacologic comfort measures Outcome: Progressing   Problem: Health Behavior/Discharge Planning: Goal: Ability to manage health-related needs will improve Outcome: Progressing   Problem: Clinical Measurements: Goal: Ability to maintain clinical measurements within normal limits will improve Outcome: Progressing Goal: Will remain free from infection Outcome: Progressing Goal: Diagnostic test results will improve Outcome: Progressing Goal: Respiratory complications will improve Outcome: Progressing Goal: Cardiovascular complication will be avoided Outcome: Progressing   Problem: Activity: Goal: Risk for activity intolerance will decrease Outcome: Progressing   Problem: Nutrition: Goal: Adequate nutrition will be maintained Outcome: Progressing   Problem: Coping: Goal: Level of anxiety will decrease Outcome: Progressing   Problem: Elimination: Goal: Will not experience complications related to bowel motility Outcome: Progressing Goal: Will not experience complications related to urinary retention Outcome: Progressing   Problem: Pain Managment: Goal: General experience of comfort will improve Outcome: Progressing   Problem: Safety: Goal: Ability to remain free from injury will improve Outcome: Progressing   Problem: Skin Integrity: Goal: Risk for impaired skin integrity will decrease Outcome: Progressing   Problem: Education: Goal: Ability to describe self-care measures that may prevent or decrease complications (Diabetes Survival Skills Education) will improve Outcome: Progressing Goal: Individualized Educational Video(s) Outcome: Progressing   Problem: Coping: Goal: Ability to adjust to condition or change in health will improve Outcome: Progressing   Problem: Fluid Volume: Goal: Ability to  maintain a balanced intake and output will improve Outcome: Progressing   Problem: Health Behavior/Discharge Planning: Goal: Ability to identify and utilize available resources and services will improve Outcome: Progressing Goal: Ability to manage health-related needs will improve Outcome: Progressing   Problem: Metabolic: Goal: Ability to maintain appropriate glucose levels will improve Outcome: Progressing   Problem: Nutritional: Goal: Maintenance of adequate nutrition will improve Outcome: Progressing Goal: Progress toward achieving an optimal weight will improve Outcome: Progressing   Problem: Skin Integrity: Goal: Risk for impaired skin integrity will decrease Outcome: Progressing   Problem: Tissue Perfusion: Goal: Adequacy of tissue perfusion will improve Outcome: Progressing   Problem: Education: Goal: Knowledge of disease or condition will improve Outcome: Progressing Goal: Knowledge of secondary prevention will improve (SELECT ALL) Outcome: Progressing Goal: Knowledge of patient specific risk factors will improve (INDIVIDUALIZE FOR PATIENT) Outcome: Progressing Goal: Individualized Educational Video(s) Outcome: Progressing   Problem: Health Behavior/Discharge Planning: Goal: Ability to manage health-related needs will improve Outcome: Progressing   Problem: Education: Goal: Knowledge about tracheostomy care/management will improve Outcome: Progressing   Problem: Activity: Goal: Ability to tolerate increased activity will improve Outcome: Progressing   Problem: Health Behavior/Discharge Planning: Goal: Ability to manage tracheostomy will improve Outcome: Progressing   Problem: Respiratory: Goal: Patent airway maintenance will improve Outcome: Progressing   Problem: Role Relationship: Goal: Ability to communicate will improve Outcome: Progressing   Problem: Education: Goal: Knowledge of secondary prevention will improve (SELECT ALL) Outcome:  Progressing Goal: Knowledge of patient specific risk factors will improve (INDIVIDUALIZE FOR PATIENT) Outcome: Progressing   Problem: Intracerebral Hemorrhage Tissue Perfusion: Goal: Complications of Intracerebral Hemorrhage will be minimized Outcome: Progressing   

## 2022-10-28 DIAGNOSIS — Z7189 Other specified counseling: Secondary | ICD-10-CM

## 2022-10-28 DIAGNOSIS — Z515 Encounter for palliative care: Secondary | ICD-10-CM

## 2022-10-28 LAB — GLUCOSE, CAPILLARY
Glucose-Capillary: 102 mg/dL — ABNORMAL HIGH (ref 70–99)
Glucose-Capillary: 85 mg/dL (ref 70–99)
Glucose-Capillary: 105 mg/dL — ABNORMAL HIGH (ref 70–99)
Glucose-Capillary: 136 mg/dL — ABNORMAL HIGH (ref 70–99)

## 2022-10-28 LAB — FISH HES LEUKEMIA, 4Q12 REA

## 2022-10-28 LAB — FLOW CYTOMETRY

## 2022-10-28 MED ORDER — HYDRALAZINE HCL 10 MG PO TABS
10.0000 mg | ORAL_TABLET | Freq: Two times a day (BID) | ORAL | Status: DC
  Administered 2022-10-28 – 2022-11-07 (×21): 10 mg via ORAL
  Filled 2022-10-28 (×21): qty 1

## 2022-10-28 MED ORDER — FAMOTIDINE 20 MG PO TABS
20.0000 mg | ORAL_TABLET | Freq: Every day | ORAL | Status: DC
  Administered 2022-10-28 – 2022-11-09 (×13): 20 mg via ORAL
  Filled 2022-10-28 (×12): qty 1

## 2022-10-28 MED ORDER — METOPROLOL TARTRATE 50 MG PO TABS
50.0000 mg | ORAL_TABLET | Freq: Two times a day (BID) | ORAL | Status: DC
  Administered 2022-10-28 – 2022-11-07 (×22): 50 mg via ORAL
  Filled 2022-10-28 (×21): qty 1

## 2022-10-28 MED ORDER — AMLODIPINE BESYLATE 10 MG PO TABS
10.0000 mg | ORAL_TABLET | Freq: Every day | ORAL | Status: DC
  Administered 2022-10-28 – 2022-11-07 (×11): 10 mg via ORAL
  Filled 2022-10-28 (×10): qty 1

## 2022-10-28 NOTE — Progress Notes (Signed)
[facility] PROGRESS NOTE   Martin Cyndi Lennert [id number] DOB: 1961-11-17 DOA: 08/15/2022  PCP: Marliss Coots, NP  Brief History/Interval Summary: 61 year old with past medical history significant for hypertension, history of DVT admitted 9/28 as a code stroke, admitted to ICU with working diagnosis of intracranial hemorrhage right MCA ruptured aneurysm, has had prolonged hospitalization including multiple procedures, work-up including right ICA coil embolization of right ICA terminus aneurysm on 08/08/2022, tracheostomy by CCM on 10/10 and transferred to [facility] on 10/22. 9/28 admitted intracranial hemorrhage right MCA ruptured aneurysm status post CT head. 9/29: MRI brain large acute intraparenchymal hemorrhage extension into the right greater than left lateral ventricle, third ventricle and fourth ventricle. 9/29 developed fever and pneumonia, MSSA and respiratory culture white blood cell up to 56.  Treated. 10/15 transferred to the hospitalist service, transferred back to ICU for worsening respiratory status.  Was placed back on vent. 10/26; IVC filter and PEG tube placed by IR. 10/28: After conference call with family CODE STATUS was changed to DNR/DNI do not placed back on vent support. 11/01: Speech recommend Dysphagia 3 diet.  11/07 started Bactrim to cover for PNA.  11/16: Tracheostomy tube was changed to a #6 Shiley flex cuffless  11/19: trach removed by the patient, assessed by CCM, Dr. Valeta Harms, patient able to cough and clear his throat, phonate, recommended to observe without trach  Consultants: Critical care medicine   Subjective/Interval History: Patient denies any complaints.  Feels well.  No pain issues.  Tolerating his diet.     Assessment/Plan:  ICH (intracerebral hemorrhage) ([place]), right MCA ruptured aneurysm -Status post right ICA coil embolization of the right ICA terminus aneurysm on 08/07/2022 Left-sided weakness appears to be improving.   Continue with  physical and Occupational Therapy.   Acute respiratory failure with hypoxia secondary to pneumonia Pneumonia due to MSSA and Enterobacter Underwent tracheostomy by critical care medicine. Has completed multiple courses of antibiotics. Patient was followed by CCM for trach management On 11/6, patient had low-grade fever, leukocytosis, sputum cultures positive for Staph aureus, was placed on Bactrim.  Due to rising creatinine, Bactrim was changed over to meropenem on 11/11, has completed course of antibiotics. Self decannulated on 11/19, RT was unable to place trach back, was recommended to observe without trach by Dr. Valeta Harms. Seems to be doing well without tracheostomy.  Respiratory status is stable.   Acute kidney injury Creatinine peaked at 1.78 on 11/11.  After the patient was given IV fluids renal function has improved.  Monitor urine output.  Continue to hold ACE inhibitor.   Dysphagia Status post PEG tube placed by IR 10/26, was placed on tube feedings -Has made significant progress with oral intake, speech therapy recommended dysphagia 3 diet on 11/1 -Tolerating dysphagia 3 diet during the day, on nocturnal tube feeds Changed medications to oral route.   Bilateral DVTs - Not a candidate for anticoagulation due to [place] - status post IVC filter in 09/29/2022 by IR.   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  Has completed antibiotics course. -Now has remained between 50-55 Previous rounding MD consulted hem-onc. D/w Dr Lindi Adie, ordered flow cytometry and CLL FISH panel per recommendations. Results pending.   Patient will need follow-up with Dr. Lindi Adie after discharge.   Diabetes mellitus type 2 -Continue sliding scale, CBG stable   Fall on 11/13, again on 11/18  No obvious injuries noted.     Thrombocytopenia:  Resolved Nonsustained V. tach: Continue with metoprolol.  Oral thrush: Received Diflucan 11/6 for 5 days.    Obesity Class 3  Estimated body mass index is 37.08 kg/m as calculated from the following:   Height as of this encounter: 5\' 7"  (1.702 m).   Weight as of this encounter: 107.4 kg.   DVT Prophylaxis: Subcutaneous heparin Code Status: DNR Family Communication: No family at bedside Disposition Plan: Skilled nursing facility  Status is: Inpatient Remains inpatient appropriate because: Intracranial hemorrhage, dysphagia      Medications: Scheduled:  amLODipine  10 mg Oral Daily   arformoterol  15 mcg Nebulization BID   famotidine  20 mg Oral Daily   feeding supplement (JEVITY 1.5 CAL/FIBER)  1,000 mL Per Tube Q24H   free water  150 mL Per Tube Q4H   heparin injection (subcutaneous)  5,000 Units Subcutaneous Q8H   hydrALAZINE  10 mg Oral BID   insulin aspart  0-15 Units Subcutaneous TID WC   metoprolol tartrate  50 mg Oral BID   mouth rinse  15 mL Mouth Rinse BID   revefenacin  175 mcg Nebulization Daily   Continuous: [id number] **OR** acetaminophen (TYLENOL) oral liquid 160 mg/5 mL **OR** acetaminophen, albuterol, fentaNYL (SUBLIMAZE) injection, mouth rinse, oxyCODONE  Antibiotics: Anti-infectives (From admission, onward)    Start     Dose/Rate Route Frequency Ordered Stop   10/16/22 0900  meropenem (MERREM) 1 g in sodium chloride 0.9 % 100 mL IVPB        1 g 200 mL/hr over 30 Minutes Intravenous Every 8 hours 10/16/22 0803 10/18/22 0700   10/15/22 1000  meropenem (MERREM) 1 g in sodium chloride 0.9 % 100 mL IVPB  Status:  Discontinued        1 g 200 mL/hr over 30 Minutes Intravenous Every 12 hours 10/15/22 0842 10/16/22 0803   10/11/22 1200  sulfamethoxazole-trimethoprim (BACTRIM DS) 800-160 MG per tablet 1 tablet  Status:  Discontinued        1 tablet Oral Every 12 hours 10/11/22 1106 10/15/22 0842   10/10/22 1000  fluconazole (DIFLUCAN) 40 MG/ML  suspension 100 mg        100 mg Per Tube Daily 10/10/22 0835 10/14/22 1003   10/05/22 1415  fluconazole (DIFLUCAN) 40 MG/ML suspension 100 mg  Status:  Discontinued        100 mg Oral Daily 10/05/22 1319 10/10/22 0835   09/29/22 1445  ceFAZolin (ANCEF) IVPB 2g/100 mL premix  Status:  Discontinued        2 g 200 mL/hr over 30 Minutes Intravenous  Once 09/29/22 1346 10/12/22 1148   09/29/22 1145  ceFAZolin (ANCEF) IVPB 1 g/50 mL premix  Status:  Discontinued        1 g 100 mL/hr over 30 Minutes Intravenous To Radiology 09/29/22 1057 09/29/22 1059   09/29/22 1145  ceFAZolin (ANCEF) IVPB 2g/100 mL premix        2 g 200 mL/hr over 30 Minutes Intravenous  Once 09/29/22 1059 09/29/22 1159   09/14/22 1400  ceFEPIme (MAXIPIME) 2 g in sodium chloride 0.9 % 100 mL IVPB        2 g 200 mL/hr over 30 Minutes Intravenous Every 8 hours 09/14/22 1312 09/21/22 0922   09/10/22 0830  ceFAZolin (ANCEF) IVPB 1 g/50 mL premix  Status:  Discontinued        1 g 100 mL/hr over 30 Minutes Intravenous Every 8 hours 09/10/22  [id number] 09/10/22 0821   09/10/22 0830  ceFAZolin (ANCEF) IVPB 2g/100 mL premix  Status:  Discontinued        2 g 200 mL/hr over 30 Minutes Intravenous Every 8 hours 09/10/22 0821 09/14/22 1312   09/07/22 0815  ceFAZolin (ANCEF) IVPB 2g/100 mL premix        2 g 200 mL/hr over 30 Minutes Intravenous Every 8 hours 09/07/22 0807 09/08/22 1742   09/04/22 2200  Ampicillin-Sulbactam (UNASYN) 3 g in sodium chloride 0.9 % 100 mL IVPB  Status:  Discontinued        3 g 200 mL/hr over 30 Minutes Intravenous Every 24 hours 09/04/22 0722 09/04/22 1506   09/04/22 2200  ceFAZolin (ANCEF) IVPB 1 g/50 mL premix  Status:  Discontinued        1 g 100 mL/hr over 30 Minutes Intravenous Every 12 hours 09/04/22 1506 09/07/22 0807   09/02/22 1030  Ampicillin-Sulbactam (UNASYN) 3 g in sodium chloride 0.9 % 100 mL IVPB  Status:  Discontinued        3 g 200 mL/hr over 30 Minutes Intravenous Every 6 hours 09/02/22 1009  09/04/22 0722       Objective:  Vital Signs  Vitals:   10/27/22 2322 10/28/22 0330 10/28/22 0731 10/28/22 0807  BP: 129/83 (!) 142/97 (!) 137/95   Pulse: 76 69 71 72  Resp: 17 17 18 18   Temp: 98.7 F (37.1 C) 98.6 F (37 C) 98.5 F (36.9 C)   TempSrc: Oral Oral Oral   SpO2: (!) 88% (!) 88% 92% 97%  Weight:      Height:        Intake/Output Summary (Last 24 hours) at 10/28/2022 1038 Last data filed at 10/28/2022 0900 Gross per 24 hour  Intake 440 ml  Output 1000 ml  Net -560 ml    Filed Weights   10/25/22 0500 10/26/22 0443 10/27/22 0416  Weight: 107.2 kg 107.4 kg 107.8 kg    General appearance: Awake alert.  In no distress Resp: Clear to auscultation bilaterally.  Normal effort Cardio: S1-S2 is normal regular.  No S3-S4.  No rubs murmurs or bruit GI: Abdomen is soft.  Nontender nondistended.  Bowel sounds are present normal.  No masses organomegaly    Lab Results:  Data Reviewed: I have personally reviewed following labs and reports of the imaging studies  CBC: Recent Labs  Lab 10/22/22 0341 10/23/22 0420 10/24/22 0359 10/25/22 0405 10/27/22 0221  WBC 54.3* 53.7* 52.4* 53.7* 48.9*  NEUTROABS 3.8 2.7  --  3.0 7.3  HGB 12.7* 13.2 12.9* 12.2* 11.6*  HCT 39.0 40.2 40.2 38.4* 36.3*  MCV 91.3 91.4 92.0 92.5 91.7  PLT 202 196 193 188 182     Basic Metabolic Panel: Recent Labs  Lab 10/22/22 0341 10/24/22 0359 10/27/22 0221  NA 141 140 140  K 3.8 3.8 3.7  CL 105 105 102  CO2 23 24 24   GLUCOSE 107* 115* 129*  BUN 14 18 18   CREATININE 0.83 1.12 1.02  CALCIUM 9.6 9.3 9.6     GFR: Estimated Creatinine Clearance: 89.1 mL/min (by C-G formula based on SCr of 1.02 mg/dL).  Liver Function Tests: Recent Labs  Lab 10/27/22 0221  AST 27  ALT 37  ALKPHOS 53  BILITOT 0.5  PROT 6.5  ALBUMIN 3.2*     CBG: Recent Labs  Lab 10/27/22 0829 10/27/22 1123 10/27/22 1651 10/27/22 2108 10/28/22 0609  GLUCAP 153* 102* 115* 105* 102*  Radiology Studies: No results found.     LOS: 57 days   Jefry Lesinski [employer] on [email or web address]  10/28/2022, 10:38 AM

## 2022-10-28 NOTE — TOC Progression Note (Signed)
Transition of Care [facility]) - Progression Note    Patient Details  Name: Dustin Jones MRN: [id number] Date of Birth: 16-Oct-1961  [facility] CM/SW Contact  Carley Hammed, [place] Phone Number: 10/28/2022, 2:54 PM  Clinical Narrative:     CSW received a call from intake coordinator for [facility]. They will review pt again on Monday to make sure there are no adverse side effects to pt's decannulation. TOC will continue to follow.        Expected Discharge Plan and Services                                                 Social Determinants of Health (SDOH) Interventions    Readmission Risk Interventions     No data to display

## 2022-10-28 NOTE — Progress Notes (Signed)
Mobility Specialist: Progress Note   10/28/22 1000  Mobility  Activity Ambulated with assistance in hallway  Level of Assistance +2 (takes two people)  Assistive Device Four wheel walker (EVA)  Distance Ambulated (ft) 200 ft (100'x2)  Activity Response Tolerated well  Mobility Referral Yes  $Mobility charge 1 Mobility   Pt received in the bed and agreeable to mobility. Mod I with bed mobility and minA to stand. Stopped x1 for seated break secondary to general fatigue and cross-over gait. +2 for chair follow. Pt is up in the chair with call bell in reach. Chair alarm is on.   Nikalas Bramel Mobility Specialist Please contact via SecureChat or Rehab office at [phone]

## 2022-10-29 LAB — GLUCOSE, CAPILLARY
Glucose-Capillary: 111 mg/dL — ABNORMAL HIGH (ref 70–99)
Glucose-Capillary: 118 mg/dL — ABNORMAL HIGH (ref 70–99)
Glucose-Capillary: 119 mg/dL — ABNORMAL HIGH (ref 70–99)
Glucose-Capillary: 98 mg/dL (ref 70–99)

## 2022-10-29 NOTE — Progress Notes (Signed)
[facility] PROGRESS NOTE   Dustin Jones [id number] DOB: 01/26/1961 DOA: 08/21/2022  PCP: Marliss Coots, NP  Brief History/Interval Summary: 61 year old with past medical history significant for hypertension, history of DVT admitted 9/28 as a code stroke, admitted to ICU with working diagnosis of intracranial hemorrhage right MCA ruptured aneurysm, has had prolonged hospitalization including multiple procedures, work-up including right ICA coil embolization of right ICA terminus aneurysm on 08/19/2022, tracheostomy by CCM on 10/10 and transferred to [facility] on 10/22. 9/28 admitted intracranial hemorrhage right MCA ruptured aneurysm status post CT head. 9/29: MRI brain large acute intraparenchymal hemorrhage extension into the right greater than left lateral ventricle, third ventricle and fourth ventricle. 9/29 developed fever and pneumonia, MSSA and respiratory culture white blood cell up to 56.  Treated. 10/15 transferred to the hospitalist service, transferred back to ICU for worsening respiratory status.  Was placed back on vent. 10/26; IVC filter and PEG tube placed by IR. 10/28: After conference call with family CODE STATUS was changed to DNR/DNI do not placed back on vent support. 11/01: Speech recommend Dysphagia 3 diet.  11/07 started Bactrim to cover for PNA.  11/16: Tracheostomy tube was changed to a #6 Shiley flex cuffless  11/19: trach removed by the patient, assessed by CCM, Dr. Valeta Harms, patient able to cough and clear his throat, phonate, recommended to observe without trach  Consultants: Critical care medicine   Subjective/Interval History: Patient feels well.  No complaints offered.  No events noted overnight.     Assessment/Plan:  ICH (intracerebral hemorrhage) ([place]), right MCA ruptured aneurysm -Status post right ICA coil embolization of the right ICA terminus aneurysm on 08/06/2022 Left-sided weakness appears to be improving.   Continue with physical and  Occupational Therapy.   Acute respiratory failure with hypoxia secondary to pneumonia Pneumonia due to MSSA and Enterobacter Underwent tracheostomy by critical care medicine. Has completed multiple courses of antibiotics. Patient was followed by CCM for trach management On 11/6, patient had low-grade fever, leukocytosis, sputum cultures positive for Staph aureus, was placed on Bactrim.  Due to rising creatinine, Bactrim was changed over to meropenem on 11/11, has completed course of antibiotics. Self decannulated on 11/19, RT was unable to place trach back, was recommended to observe without trach by Dr. Valeta Harms. Seems to be doing well without tracheostomy.  Respiratory status is stable.   Acute kidney injury Creatinine peaked at 1.78 on 11/11.  After the patient was given IV fluids renal function has improved.  Monitor urine output.  Continue to hold ACE inhibitor.   Dysphagia Status post PEG tube placed by IR 10/26, was placed on tube feedings -Has made significant progress with oral intake, speech therapy recommended dysphagia 3 diet on 11/1 -Tolerating dysphagia 3 diet during the day, on nocturnal tube feeds Changed medications to oral route.   Bilateral DVTs - Not a candidate for anticoagulation due to [place] - status post IVC filter in 09/29/2022 by IR.   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  Has completed antibiotics course. -Now has remained between 50-55 Previous rounding MD consulted hem-onc. D/w Dr Lindi Adie, ordered flow cytometry and CLL FISH panel per recommendations. Results pending.   Patient will need follow-up with Dr. Lindi Adie after discharge.   Diabetes mellitus type 2 -Continue sliding scale, CBG stable   Fall on 11/13, again on 11/18 No obvious injuries  noted.     Thrombocytopenia: Resolved Nonsustained V.  tach: Continue with metoprolol.  Oral thrush: Received Diflucan 11/6 for 5 days.    Obesity Class 3  Estimated body mass index is 37.08 kg/m as calculated from the following:   Height as of this encounter: 5\' 7"  (1.702 m).   Weight as of this encounter: 107.4 kg.   DVT Prophylaxis: Subcutaneous heparin Code Status: DNR Family Communication: No family at bedside Disposition Plan: Skilled nursing facility  Status is: Inpatient Remains inpatient appropriate because: Intracranial hemorrhage, dysphagia      Medications: Scheduled:  amLODipine  10 mg Oral Daily   arformoterol  15 mcg Nebulization BID   famotidine  20 mg Oral Daily   feeding supplement (JEVITY 1.5 CAL/FIBER)  1,000 mL Per Tube Q24H   free water  150 mL Per Tube Q4H   heparin injection (subcutaneous)  5,000 Units Subcutaneous Q8H   hydrALAZINE  10 mg Oral BID   insulin aspart  0-15 Units Subcutaneous TID WC   metoprolol tartrate  50 mg Oral BID   mouth rinse  15 mL Mouth Rinse BID   revefenacin  175 mcg Nebulization Daily   Continuous: **OR** acetaminophen (TYLENOL) oral liquid 160 mg/5 mL **OR** acetaminophen, albuterol, fentaNYL (SUBLIMAZE) injection, mouth rinse, oxyCODONE  Antibiotics: Anti-infectives (From admission, onward)    Start     Dose/Rate Route Frequency Ordered Stop   10/16/22 0900  meropenem (MERREM) 1 g in sodium chloride 0.9 % 100 mL IVPB        1 g 200 mL/hr over 30 Minutes Intravenous Every 8 hours 10/16/22 0803 10/18/22 0700   10/15/22 1000  meropenem (MERREM) 1 g in sodium chloride 0.9 % 100 mL IVPB  Status:  Discontinued        1 g 200 mL/hr over 30 Minutes Intravenous Every 12 hours 10/15/22 0842 10/16/22 0803   10/11/22 1200  sulfamethoxazole-trimethoprim (BACTRIM DS) 800-160 MG per tablet 1 tablet  Status:  Discontinued        1 tablet Oral Every 12 hours 10/11/22 1106 10/15/22 0842   10/10/22 1000  fluconazole (DIFLUCAN) 40 MG/ML suspension 100 mg        100 mg  Per Tube Daily 10/10/22 0835 10/14/22 1003   10/05/22 1415  fluconazole (DIFLUCAN) 40 MG/ML suspension 100 mg  Status:  Discontinued        100 mg Oral Daily 10/05/22 1319 10/10/22 0835   09/29/22 1445  ceFAZolin (ANCEF) IVPB 2g/100 mL premix  Status:  Discontinued        2 g 200 mL/hr over 30 Minutes Intravenous  Once 09/29/22 1346 10/12/22 1148   09/29/22 1145  ceFAZolin (ANCEF) IVPB 1 g/50 mL premix  Status:  Discontinued        1 g 100 mL/hr over 30 Minutes Intravenous To Radiology 09/29/22 1057 09/29/22 1059   09/29/22 1145  ceFAZolin (ANCEF) IVPB 2g/100 mL premix        2 g 200 mL/hr over 30 Minutes Intravenous  Once 09/29/22 1059 09/29/22 1159   09/14/22 1400  ceFEPIme (MAXIPIME) 2 g in sodium chloride 0.9 % 100 mL IVPB        2 g 200 mL/hr over 30 Minutes Intravenous Every 8 hours 09/14/22 1312 09/21/22 0922   09/10/22 0830  ceFAZolin (ANCEF) IVPB 1 g/50 mL premix  Status:  Discontinued        1 g 100 mL/hr over 30 Minutes Intravenous Every 8 hours 09/10/22 0738 09/10/22 11/10/22  09/10/22 0830  ceFAZolin (ANCEF) IVPB 2g/100 mL premix  Status:  Discontinued        2 g 200 mL/hr over 30 Minutes Intravenous Every 8 hours 09/10/22 0821 09/14/22 1312   09/07/22 0815  ceFAZolin (ANCEF) IVPB 2g/100 mL premix        2 g 200 mL/hr over 30 Minutes Intravenous Every 8 hours 09/07/22 0807 09/08/22 1742   09/04/22 2200  Ampicillin-Sulbactam (UNASYN) 3 g in sodium chloride 0.9 % 100 mL IVPB  Status:  Discontinued        3 g 200 mL/hr over 30 Minutes Intravenous Every 24 hours 09/04/22 0722 09/04/22 1506   09/04/22 2200  ceFAZolin (ANCEF) IVPB 1 g/50 mL premix  Status:  Discontinued        1 g 100 mL/hr over 30 Minutes Intravenous Every 12 hours 09/04/22 1506 09/07/22 0807   09/02/22 1030  Ampicillin-Sulbactam (UNASYN) 3 g in sodium chloride 0.9 % 100 mL IVPB  Status:  Discontinued        3 g 200 mL/hr over 30 Minutes Intravenous Every 6 hours 09/02/22 1009 09/04/22 0722        Objective:  Vital Signs  Vitals:   10/28/22 2333 10/29/22 0324 10/29/22 0814 10/29/22 0817  BP: 119/68 (!) 141/84 123/64   Pulse: 69 73 67 64  Resp: 20 20 18 18   Temp: 98.2 F (36.8 C) 98.4 F (36.9 C) 98.5 F (36.9 C)   TempSrc: Oral Oral Oral   SpO2: 93% 98% 100% 97%  Weight:      Height:        Intake/Output Summary (Last 24 hours) at 10/29/2022 0923 Last data filed at 10/29/2022 0325 Gross per 24 hour  Intake --  Output 1150 ml  Net -1150 ml    Filed Weights   10/25/22 0500 10/26/22 0443 10/27/22 0416  Weight: 107.2 kg 107.4 kg 107.8 kg    Patient is awake and alert.  Does not appear to be in any discomfort or distress Lungs are clear to auscultation S1-S2 is normal regular PEG tube noted over the abdomen.  Abdomen is benign.    Lab Results:  Data Reviewed: I have personally reviewed following labs and reports of the imaging studies  CBC: Recent Labs  Lab 10/23/22 0420 10/24/22 0359 10/25/22 0405 10/27/22 0221  WBC 53.7* 52.4* 53.7* 48.9*  NEUTROABS 2.7  --  3.0 7.3  HGB 13.2 12.9* 12.2* 11.6*  HCT 40.2 40.2 38.4* 36.3*  MCV 91.4 92.0 92.5 91.7  PLT 196 193 188 182     Basic Metabolic Panel: Recent Labs  Lab 10/24/22 0359 10/27/22 0221  NA 140 140  K 3.8 3.7  CL 105 102  CO2 24 24  GLUCOSE 115* 129*  BUN 18 18  CREATININE 1.12 1.02  CALCIUM 9.3 9.6     GFR: Estimated Creatinine Clearance: 89.1 mL/min (by C-G formula based on SCr of 1.02 mg/dL).  Liver Function Tests: Recent Labs  Lab 10/27/22 0221  AST 27  ALT 37  ALKPHOS 53  BILITOT 0.5  PROT 6.5  ALBUMIN 3.2*     CBG: Recent Labs  Lab 10/28/22 0609 10/28/22 1156 10/28/22 1607 10/28/22 2220 10/29/22 0615  GLUCAP 102* 85 105* 136* 111*      Radiology Studies: No results found.     LOS: 19 days   Belenda [employer] on [email or web address]  10/29/2022, 9:23 AM

## 2022-10-30 LAB — GLUCOSE, CAPILLARY
Glucose-Capillary: 125 mg/dL — ABNORMAL HIGH (ref 70–99)
Glucose-Capillary: 129 mg/dL — ABNORMAL HIGH (ref 70–99)
Glucose-Capillary: 127 mg/dL — ABNORMAL HIGH (ref 70–99)
Glucose-Capillary: 122 mg/dL — ABNORMAL HIGH (ref 70–99)

## 2022-10-30 MED ORDER — OSMOLITE 1.5 CAL PO LIQD
1000.0000 mL | ORAL | Status: AC
  Administered 2022-10-30: 1000 mL

## 2022-10-30 MED ORDER — JEVITY 1.5 CAL/FIBER PO LIQD
1000.0000 mL | ORAL | Status: DC
  Administered 2022-10-31 – 2022-11-01 (×2): 1000 mL
  Filled 2022-10-30 (×4): qty 1000

## 2022-10-30 NOTE — Progress Notes (Signed)
[facility] PROGRESS NOTE   Dustin Jones OIZ:[id number] DOB: 12-Nov-1961 DOA: 08/17/2022  PCP: Marliss Coots, NP  Brief History/Interval Summary: 62 year old with past medical history significant for hypertension, history of DVT admitted 9/28 as a code stroke, admitted to ICU with working diagnosis of intracranial hemorrhage right MCA ruptured aneurysm, has had prolonged hospitalization including multiple procedures, work-up including right ICA coil embolization of right ICA terminus aneurysm on 08/11/2022, tracheostomy by CCM on 10/10 and transferred to [facility] on 10/22. 9/28 admitted intracranial hemorrhage right MCA ruptured aneurysm status post CT head. 9/29: MRI brain large acute intraparenchymal hemorrhage extension into the right greater than left lateral ventricle, third ventricle and fourth ventricle. 9/29 developed fever and pneumonia, MSSA and respiratory culture white blood cell up to 56.  Treated. 10/15 transferred to the hospitalist service, transferred back to ICU for worsening respiratory status.  Was placed back on vent. 10/26; IVC filter and PEG tube placed by IR. 10/28: After conference call with family CODE STATUS was changed to DNR/DNI do not placed back on vent support. 11/01: Speech recommend Dysphagia 3 diet.  11/07 started Bactrim to cover for PNA.  11/16: Tracheostomy tube was changed to a #6 Shiley flex cuffless  11/19: trach removed by the patient, assessed by CCM, Dr. Valeta Harms, patient able to cough and clear his throat, phonate, recommended to observe without trach  Consultants: Critical care medicine   Subjective/Interval History: Patient denies any complaints.  Eating and drinking well.  Having regular bowel movements.  No pain issues.   Assessment/Plan:  ICH (intracerebral hemorrhage) ([place]), right MCA ruptured aneurysm -Status post right ICA coil embolization of the right ICA terminus aneurysm on 08/15/2022 Left-sided weakness appears to be  improving.   Continue with physical and Occupational Therapy.   Acute respiratory failure with hypoxia secondary to pneumonia Pneumonia due to MSSA and Enterobacter Underwent tracheostomy by critical care medicine. Has completed multiple courses of antibiotics. Patient was followed by CCM for trach management On 11/6, patient had low-grade fever, leukocytosis, sputum cultures positive for Staph aureus, was placed on Bactrim.  Due to rising creatinine, Bactrim was changed over to meropenem on 11/11, has completed course of antibiotics. Self decannulated on 11/19, RT was unable to place trach back, was recommended to observe without trach by Dr. Valeta Harms. Seems to be doing well without tracheostomy.  Respiratory status is stable.   Acute kidney injury Creatinine peaked at 1.78 on 11/11.  After the patient was given IV fluids renal function has improved.  Monitor urine output.  Continue to hold ACE inhibitor.   Dysphagia Status post PEG tube placed by IR 10/26, was placed on tube feedings -Has made significant progress with oral intake, speech therapy recommended dysphagia 3 diet on 11/1 -Tolerating dysphagia 3 diet during the day, on nocturnal tube feeds Changed medications to oral route.   Bilateral DVTs - Not a candidate for anticoagulation due to [place] - status post IVC filter in 09/29/2022 by IR.   Essential hypertension -BP stable, continue amlodipine, hydralazine, metoprolol  -Lisinopril was held due to AKI Will order labs for tomorrow.   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  Has completed antibiotics course. Now has remained between 50-55 Previous rounding MD consulted hem-onc. D/w Dr Lindi Adie.  Low cytometry and CLL FISH panel was ordered. Flow cytometry: "CLL RELATED CLONE DETECTED . 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 Results  for CCND1/IGH, chromosome 12, 13q  and TP53 were normal. Deletion of ATM is associated with a less favorable prognosis in patients diagnosed with CLL. SPECIFIC FISH RESULTS:   ATM: ABNORMAL.       nuc ish 11q22.3(ATMx1)[67/100].   CCND1/IGH: NORMAL .       nuc ish 11q13(CCND1x2),14q32(IGHx2)[100].   12cen: NORMAL  .       nuc ish 12cen(D12Z3x2)[100].   13q: NORMAL.       nuc ish 13q14.3(DLEUx2),13q34(TFDP1x2)[100]  .   TP53: NORMAL .       nuc ish 17p13.1(TP53x2)[100]         This analysis is limited to abnormalities detectable by the specific probes included in the study. FISH results should be interpreted within the context of a full cytogenetic analysis and hematologic evaluation. " Will inform Dr. Lindi Adie of these results tomorrow.   Diabetes mellitus type 2 -Continue sliding scale, CBG stable   Fall on 11/13, again on 11/18 No obvious injuries noted.     Thrombocytopenia: Resolved Nonsustained V. tach: Continue with metoprolol.  Oral thrush: Received Diflucan 11/6 for 5 days.    Obesity Class 3  Estimated body mass index is 37.08 kg/m as calculated from the following:   Height as of this encounter: _0  (1.702 m).   Weight as of this encounter: 107.4 kg.   DVT Prophylaxis: Subcutaneous heparin Code Status: DNR Family Communication: No family at bedside Disposition Plan: Skilled nursing facility  Status is: Inpatient Remains inpatient appropriate because: Intracranial hemorrhage, dysphagia      Medications: Scheduled:  amLODipine  10 mg Oral Daily   arformoterol  15 mcg Nebulization BID   famotidine  20 mg Oral Daily   feeding supplement (JEVITY 1.5 CAL/FIBER)  1,000 mL Per Tube Q24H   free water  150 mL Per Tube Q4H   heparin injection (subcutaneous)  5,000 Units Subcutaneous Q8H   hydrALAZINE  10 mg Oral BID   insulin aspart  0-15 Units Subcutaneous TID WC   metoprolol tartrate  50 mg Oral BID   mouth rinse  15 mL Mouth Rinse BID   revefenacin  175 mcg Nebulization Daily    Continuous: CBU:[id number] **OR** acetaminophen (TYLENOL) oral liquid 160 mg/5 mL **OR** acetaminophen, albuterol, fentaNYL (SUBLIMAZE) injection, mouth rinse, oxyCODONE  Antibiotics: Anti-infectives (From admission, onward)    Start     Dose/Rate Route Frequency Ordered Stop   10/16/22 0900  meropenem (MERREM) 1 g in sodium chloride 0.9 % 100 mL IVPB        1 g 200 mL/hr over 30 Minutes Intravenous Every 8 hours 10/16/22 0803 10/18/22 0700   10/15/22 1000  meropenem (MERREM) 1 g in sodium chloride 0.9 % 100 mL IVPB  Status:  Discontinued        1 g 200 mL/hr over 30 Minutes Intravenous Every 12 hours 10/15/22 0842 10/16/22 0803   10/11/22 1200  sulfamethoxazole-trimethoprim (BACTRIM DS) 800-160 MG per tablet 1 tablet  Status:  Discontinued        1 tablet Oral Every 12 hours 10/11/22 1106 10/15/22 0842   10/10/22 1000  fluconazole (DIFLUCAN) 40 MG/ML suspension 100 mg        100 mg Per Tube Daily 10/10/22 0835 10/14/22 1003   10/05/22 1415  fluconazole (DIFLUCAN) 40 MG/ML suspension 100 mg  Status:  Discontinued        100 mg Oral Daily 10/05/22 1319 10/10/22 0835   09/29/22 1445  ceFAZolin (ANCEF) IVPB 2g/100 mL premix  Status:  Discontinued        2 g 200 mL/hr over 30 Minutes Intravenous  Once 09/29/22 1346 10/12/22 1148   09/29/22 1145  ceFAZolin (ANCEF) IVPB 1 g/50 mL premix  Status:  Discontinued        1 g 100 mL/hr over 30 Minutes Intravenous To Radiology 09/29/22 1057 09/29/22 1059   09/29/22 1145  ceFAZolin (ANCEF) IVPB 2g/100 mL premix        2 g 200 mL/hr over 30 Minutes Intravenous  Once 09/29/22 1059 09/29/22 1159   09/14/22 1400  ceFEPIme (MAXIPIME) 2 g in sodium chloride 0.9 % 100 mL IVPB        2 g 200 mL/hr over 30 Minutes Intravenous Every 8 hours 09/14/22 1312 09/21/22 0922   09/10/22 0830  ceFAZolin (ANCEF) IVPB 1 g/50 mL premix  Status:  Discontinued        1 g 100 mL/hr over 30 Minutes Intravenous Every 8 hours 09/10/22 0738 09/10/22 0821   09/10/22  0830  ceFAZolin (ANCEF) IVPB 2g/100 mL premix  Status:  Discontinued        2 g 200 mL/hr over 30 Minutes Intravenous Every 8 hours 09/10/22 0821 09/14/22 1312   09/07/22 0815  ceFAZolin (ANCEF) IVPB 2g/100 mL premix        2 g 200 mL/hr over 30 Minutes Intravenous Every 8 hours 09/07/22 0807 09/08/22 1742   09/04/22 2200  Ampicillin-Sulbactam (UNASYN) 3 g in sodium chloride 0.9 % 100 mL IVPB  Status:  Discontinued        3 g 200 mL/hr over 30 Minutes Intravenous Every 24 hours 09/04/22 0722 09/04/22 1506   09/04/22 2200  ceFAZolin (ANCEF) IVPB 1 g/50 mL premix  Status:  Discontinued        1 g 100 mL/hr over 30 Minutes Intravenous Every 12 hours 09/04/22 1506 09/07/22 0807   09/02/22 1030  Ampicillin-Sulbactam (UNASYN) 3 g in sodium chloride 0.9 % 100 mL IVPB  Status:  Discontinued        3 g 200 mL/hr over 30 Minutes Intravenous Every 6 hours 09/02/22 1009 09/04/22 0722       Objective:  Vital Signs  Vitals:   10/29/22 2028 10/29/22 2344 10/30/22 0319 10/30/22 0748  BP:  (!) 149/98 (!) 126/92 127/84  Pulse:  70 73 69  Resp:  _0 Temp:  98.8 F (37.1 C) 98.1 F (36.7 C) 97.7 F (36.5 C)  TempSrc:  Oral Oral Oral  SpO2: 91% 93% 92% 100%  Weight:      Height:        Intake/Output Summary (Last 24 hours) at 10/30/2022 1049 Last data filed at 10/30/2022 0200 Gross per 24 hour  Intake --  Output 500 ml  Net -500 ml    Filed Weights   10/25/22 0500 10/26/22 0443 10/27/22 0416  Weight: 107.2 kg 107.4 kg 107.8 kg   Patient is awake alert.  In no distress.    Lab Results:  Data Reviewed: I have personally reviewed following labs and reports of the imaging studies  CBC: Recent Labs  Lab 10/24/22 0359 10/25/22 0405 10/27/22 0221  WBC 52.4* 53.7* 48.9*  NEUTROABS  --  3.0 7.3  HGB 12.9* 12.2* 11.6*  HCT 40.2 38.4* 36.3*  MCV 92.0 92.5 91.7  PLT 193 188 182     Basic Metabolic Panel: Recent Labs  Lab 10/24/22 0359 10/27/22 0221  NA 140 140  K  3.8 3.7  CL 105 102  CO2 24 24  GLUCOSE 115* 129*  BUN 18 18  CREATININE 1.12 1.02  CALCIUM 9.3 9.6     GFR: Estimated Creatinine Clearance: 89.1 mL/min (by C-G formula based on SCr of 1.02 mg/dL).  Liver Function Tests: Recent Labs  Lab 10/27/22 0221  AST 27  ALT 37  ALKPHOS 53  BILITOT 0.5  PROT 6.5  ALBUMIN 3.2*     CBG: Recent Labs  Lab 10/29/22 0615 10/29/22 1153 10/29/22 1605 10/29/22 2108 10/30/22 0615  GLUCAP 111* 118* 119* 98 125*      Radiology Studies: No results found.     LOS: 54 days   [employer] on [email or web address]  10/30/2022, 10:49 AM

## 2022-10-30 NOTE — Progress Notes (Addendum)
Mobility Specialist Progress Note   10/30/22 0940  Mobility  Activity Ambulated with assistance in hallway;Stood at bedside  Level of Assistance Minimal assist, patient does 75% or more  Assistive Device Four wheel walker (EVA)  Distance Ambulated (ft) 100 ft (50 + 50)  Range of Motion/Exercises Active;All extremities  Activity Response Tolerated well   Patient received in supine and agreeable to participate. Was independent for bed mobility and stood with min A. Was able to stand for extended period as patient required pericare for stool incontinence. Ambulated min A with EVA walker. Distance limited by fatigue. Returned to room without complaint or incident. Was left in supine with all needs met, call bell in reach.   Dustin Jones, BS EXP Mobility Specialist Please contact via SecureChat or Rehab office at [phone]

## 2022-10-31 LAB — BASIC METABOLIC PANEL
Anion gap: 9 (ref 5–15)
BUN: 12 mg/dL (ref 8–23)
CO2: 23 mmol/L (ref 22–32)
Calcium: 9.1 mg/dL (ref 8.9–10.3)
Chloride: 108 mmol/L (ref 98–111)
Creatinine, Ser: 0.91 mg/dL (ref 0.61–1.24)
GFR, Estimated: >60 mL/min (ref >60)
Glucose, Bld: 116 mg/dL — ABNORMAL HIGH (ref 70–99)
Potassium: 4 mmol/L (ref 3.5–5.1)
Sodium: 140 mmol/L (ref 135–145)

## 2022-10-31 LAB — GLUCOSE, CAPILLARY
Glucose-Capillary: 106 mg/dL — ABNORMAL HIGH (ref 70–99)
Glucose-Capillary: 103 mg/dL — ABNORMAL HIGH (ref 70–99)
Glucose-Capillary: 122 mg/dL — ABNORMAL HIGH (ref 70–99)
Glucose-Capillary: 112 mg/dL — ABNORMAL HIGH (ref 70–99)

## 2022-10-31 LAB — CBC
HCT: 36.8 % — ABNORMAL LOW (ref 39.0–52.0)
Hemoglobin: 11.7 g/dL — ABNORMAL LOW (ref 13.0–17.0)
MCH: 29.5 pg (ref 26.0–34.0)
MCHC: 31.8 g/dL (ref 30.0–36.0)
MCV: 92.9 fL (ref 80.0–100.0)
Platelets: 176 10*3/uL (ref 150–400)
RBC: 3.96 MIL/uL — ABNORMAL LOW (ref 4.22–5.81)
RDW: 13.2 % (ref 11.5–15.5)
WBC: 47.1 10*3/uL — ABNORMAL HIGH (ref 4.0–10.5)
nRBC: 0 % (ref 0.0–0.2)

## 2022-10-31 LAB — MAGNESIUM: Magnesium: 1.8 mg/dL (ref 1.7–2.4)

## 2022-10-31 LAB — PHOSPHORUS: Phosphorus: 3.9 mg/dL (ref 2.5–4.6)

## 2022-10-31 NOTE — Progress Notes (Signed)
[facility] PROGRESS NOTE   Deunte Cyndi Lennert WNI:[id number] DOB: [birth year] DOA: 08/30/2022  PCP: Marliss Coots, NP  Brief History/Interval Summary: 61 year old with past medical history significant for hypertension, history of DVT admitted 9/28 as a code stroke, admitted to ICU with working diagnosis of intracranial hemorrhage right MCA ruptured aneurysm, has had prolonged hospitalization including multiple procedures, work-up including right ICA coil embolization of right ICA terminus aneurysm on 09/03/2022, tracheostomy by CCM on 10/10 and transferred to [facility] on 10/22. 9/28 admitted intracranial hemorrhage right MCA ruptured aneurysm status post CT head. 9/29: MRI brain large acute intraparenchymal hemorrhage extension into the right greater than left lateral ventricle, third ventricle and fourth ventricle. 9/29 developed fever and pneumonia, MSSA and respiratory culture white blood cell up to 56.  Treated. 10/15 transferred to the hospitalist service, transferred back to ICU for worsening respiratory status.  Was placed back on vent. 10/26; IVC filter and PEG tube placed by IR. 10/28: After conference call with family CODE STATUS was changed to DNR/DNI do not placed back on vent support. 11/01: Speech recommend Dysphagia 3 diet.  11/07 started Bactrim to cover for PNA.  11/16: Tracheostomy tube was changed to a #6 Shiley flex cuffless  11/19: trach removed by the patient, assessed by CCM, Dr. Valeta Harms, patient able to cough and clear his throat, phonate, recommended to observe without trach  Consultants: Critical care medicine   Subjective/Interval History: No complaints offered.  No events reported by nursing staff.   Assessment/Plan:  ICH (intracerebral hemorrhage) ([place]), right MCA ruptured aneurysm -Status post right ICA coil embolization of the right ICA terminus aneurysm on 09/03/2022 Left-sided weakness appears to be improving.   Continue with physical and Occupational  Therapy.   Acute respiratory failure with hypoxia secondary to pneumonia Pneumonia due to MSSA and Enterobacter Underwent tracheostomy by critical care medicine. Has completed multiple courses of antibiotics. Patient was followed by CCM for trach management On 11/6, patient had low-grade fever, leukocytosis, sputum cultures positive for Staph aureus, was placed on Bactrim.  Due to rising creatinine, Bactrim was changed over to meropenem on 11/11, has completed course of antibiotics. Self decannulated on 11/19, RT was unable to place trach back, was recommended to observe without trach by Dr. Valeta Harms. Seems to be doing well without tracheostomy.   Respiratory status is stable.   Acute kidney injury Creatinine peaked at 1.78 on 11/11.  After the patient was given IV fluids renal function has improved.  Monitor urine output.  Continue to hold ACE inhibitor.   Dysphagia Status post PEG tube placed by IR 10/26, was placed on tube feedings -Has made significant progress with oral intake, speech therapy recommended dysphagia 3 diet on 11/1 -Tolerating dysphagia 3 diet during the day, on nocturnal tube feeds Changed medications to oral route.   Bilateral DVTs - Not a candidate for anticoagulation due to [place] - status post IVC filter in 09/29/2022 by IR.   Essential hypertension Blood pressure is reasonably well-controlled. Noted to be on amlodipine, hydralazine and metoprolol.   -Lisinopril was held due to AKI Electrolytes are stable.   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  Has completed antibiotics course. Now has remained between 50-55 Previous rounding MD consulted hem-onc. D/w Dr Lindi Adie.  Low cytometry and CLL FISH panel was ordered. Flow cytometry: "CLL RELATED CLONE DETECTED . 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 Results for  CCND1/IGH, chromosome 12, 13q and TP53 were  normal. Deletion of ATM is associated with a less favorable prognosis in patients diagnosed with CLL. SPECIFIC FISH RESULTS:   ATM: ABNORMAL.       nuc ish 11q22.3(ATMx1)[67/100].   CCND1/IGH: NORMAL .       nuc ish 11q13(CCND1x2),14q32(IGHx2)[100].   12cen: NORMAL  .       nuc ish 12cen(D12Z3x2)[100].   13q: NORMAL.       nuc ish 13q14.3(DLEUx2),13q34(TFDP1x2)[100]  .   TP53: NORMAL .       nuc ish 17p13.1(TP53x2)[100]         This analysis is limited to abnormalities detectable by the specific probes included in the study. FISH results should be interpreted within the context of a full cytogenetic analysis and hematologic evaluation. " Will inform Dr. Lindi Adie of these results today.   Diabetes mellitus type 2 -Continue sliding scale, CBG stable   Fall on 11/13, again on 11/18 No obvious injuries noted.     Thrombocytopenia: Resolved Nonsustained V. tach: Continue with metoprolol.  Oral thrush: Received Diflucan 11/6 for 5 days.    Obesity Class 3  Estimated body mass index is 37.08 kg/m as calculated from the following:   Height as of this encounter: _0  (1.702 m).   Weight as of this encounter: 107.4 kg.   DVT Prophylaxis: Subcutaneous heparin Code Status: DNR Family Communication: No family at bedside Disposition Plan: Skilled nursing facility  Status is: Inpatient Remains inpatient appropriate because: Intracranial hemorrhage, dysphagia      Medications: Scheduled:  amLODipine  10 mg Oral Daily   arformoterol  15 mcg Nebulization BID   famotidine  20 mg Oral Daily   free water  150 mL Per Tube Q4H   heparin injection (subcutaneous)  5,000 Units Subcutaneous Q8H   hydrALAZINE  10 mg Oral BID   insulin aspart  0-15 Units Subcutaneous TID WC   metoprolol tartrate  50 mg Oral BID   mouth rinse  15 mL Mouth Rinse BID   revefenacin  175 mcg Nebulization Daily   Continuous:  feeding supplement (JEVITY 1.5 CAL/FIBER)     FMB:[id number] **OR** acetaminophen  (TYLENOL) oral liquid 160 mg/5 mL **OR** acetaminophen, albuterol, fentaNYL (SUBLIMAZE) injection, mouth rinse, oxyCODONE  Antibiotics: Anti-infectives (From admission, onward)    Start     Dose/Rate Route Frequency Ordered Stop   10/16/22 0900  meropenem (MERREM) 1 g in sodium chloride 0.9 % 100 mL IVPB        1 g 200 mL/hr over 30 Minutes Intravenous Every 8 hours 10/16/22 0803 10/18/22 0700   10/15/22 1000  meropenem (MERREM) 1 g in sodium chloride 0.9 % 100 mL IVPB  Status:  Discontinued        1 g 200 mL/hr over 30 Minutes Intravenous Every 12 hours 10/15/22 0842 10/16/22 0803   10/11/22 1200  sulfamethoxazole-trimethoprim (BACTRIM DS) 800-160 MG per tablet 1 tablet  Status:  Discontinued        1 tablet Oral Every 12 hours 10/11/22 1106 10/15/22 0842   10/10/22 1000  fluconazole (DIFLUCAN) 40 MG/ML suspension 100 mg        100 mg Per Tube Daily 10/10/22 0835 10/14/22 1003   10/05/22 1415  fluconazole (DIFLUCAN) 40 MG/ML suspension 100 mg  Status:  Discontinued        100 mg Oral Daily 10/05/22 1319 10/10/22 0835   09/29/22 1445  ceFAZolin (ANCEF) IVPB 2g/100 mL premix  Status:  Discontinued  2 g 200 mL/hr over 30 Minutes Intravenous  Once 09/29/22 1346 10/12/22 1148   09/29/22 1145  ceFAZolin (ANCEF) IVPB 1 g/50 mL premix  Status:  Discontinued        1 g 100 mL/hr over 30 Minutes Intravenous To Radiology 09/29/22 1057 09/29/22 1059   09/29/22 1145  ceFAZolin (ANCEF) IVPB 2g/100 mL premix        2 g 200 mL/hr over 30 Minutes Intravenous  Once 09/29/22 1059 09/29/22 1159   09/14/22 1400  ceFEPIme (MAXIPIME) 2 g in sodium chloride 0.9 % 100 mL IVPB        2 g 200 mL/hr over 30 Minutes Intravenous Every 8 hours 09/14/22 1312 09/21/22 0922   09/10/22 0830  ceFAZolin (ANCEF) IVPB 1 g/50 mL premix  Status:  Discontinued        1 g 100 mL/hr over 30 Minutes Intravenous Every 8 hours 09/10/22 0738 09/10/22 0821   09/10/22 0830  ceFAZolin (ANCEF) IVPB 2g/100 mL premix  Status:   Discontinued        2 g 200 mL/hr over 30 Minutes Intravenous Every 8 hours 09/10/22 0821 09/14/22 1312   09/07/22 0815  ceFAZolin (ANCEF) IVPB 2g/100 mL premix        2 g 200 mL/hr over 30 Minutes Intravenous Every 8 hours 09/07/22 0807 09/08/22 1742   09/04/22 2200  Ampicillin-Sulbactam (UNASYN) 3 g in sodium chloride 0.9 % 100 mL IVPB  Status:  Discontinued        3 g 200 mL/hr over 30 Minutes Intravenous Every 24 hours 09/04/22 0722 09/04/22 1506   09/04/22 2200  ceFAZolin (ANCEF) IVPB 1 g/50 mL premix  Status:  Discontinued        1 g 100 mL/hr over 30 Minutes Intravenous Every 12 hours 09/04/22 1506 09/07/22 0807   09/02/22 1030  Ampicillin-Sulbactam (UNASYN) 3 g in sodium chloride 0.9 % 100 mL IVPB  Status:  Discontinued        3 g 200 mL/hr over 30 Minutes Intravenous Every 6 hours 09/02/22 1009 09/04/22 0722       Objective:  Vital Signs  Vitals:   10/30/22 2322 10/31/22 0328 10/31/22 0805 10/31/22 0833  BP: 121/73 128/83 (!) 145/101   Pulse: 70 72 75   Resp: _0 Temp: 97.8 F (36.6 C) 98.1 F (36.7 C) 98.3 F (36.8 C)   TempSrc: Oral  Oral   SpO2: 91% 94% 95% 95%  Weight:      Height:        Intake/Output Summary (Last 24 hours) at 10/31/2022 1054 Last data filed at 10/30/2022 2149 Gross per 24 hour  Intake 287 ml  Output 800 ml  Net -513 ml    Filed Weights   10/25/22 0500 10/26/22 0443 10/27/22 0416  Weight: 107.2 kg 107.4 kg 107.8 kg   Patient is awake alert.  In no distress.    Lab Results:  Data Reviewed: I have personally reviewed following labs and reports of the imaging studies  CBC: Recent Labs  Lab 10/25/22 0405 10/27/22 0221 10/31/22 0337  WBC 53.7* 48.9* 47.1*  NEUTROABS 3.0 7.3  --   HGB 12.2* 11.6* 11.7*  HCT 38.4* 36.3* 36.8*  MCV 92.5 91.7 92.9  PLT 188 182 176     Basic Metabolic Panel: Recent Labs  Lab 10/27/22 0221 10/31/22 0337  NA 140 140  K 3.7 4.0  CL 102 108  CO2 24 23  GLUCOSE 129* 116*  BUN  18 12  CREATININE 1.02 0.91  CALCIUM 9.6 9.1  MG  --  1.8  PHOS  --  3.9     GFR: Estimated Creatinine Clearance: 99.8 mL/min (by C-G formula based on SCr of 0.91 mg/dL).  Liver Function Tests: Recent Labs  Lab 10/27/22 0221  AST 27  ALT 37  ALKPHOS 53  BILITOT 0.5  PROT 6.5  ALBUMIN 3.2*     CBG: Recent Labs  Lab 10/30/22 0615 10/30/22 1214 10/30/22 1702 10/30/22 2128 10/31/22 0614  GLUCAP 125* 129* 127* 122* 106*      Radiology Studies: No results found.     LOS: 60 days   Japleen Tornow [employer] on [email or web address]  10/31/2022, 10:54 AM

## 2022-10-31 NOTE — TOC Progression Note (Signed)
Transition of Care [facility]) - Progression Note    Patient Details  Name: COLSON BARCO MRN: [id number] Date of Birth: [birth year]  [facility] CM/SW Contact  Carley Hammed, [place] Phone Number: 10/31/2022, 2:29 PM  Clinical Narrative:     CSW followed up with [facility] admissions to follow up on possible placement. They are currently reviewing at the facility level. Pt is no longer has the trach, has a diet, and is ambulating better. Plan remains for SNF placement with LOG. TOC will continue to follow for DC needs.       Expected Discharge Plan and Services                                                 Social Determinants of Health (SDOH) Interventions    Readmission Risk Interventions     No data to display

## 2022-10-31 NOTE — Progress Notes (Signed)
Physical Therapy Treatment Patient Details Name: WITTEN CERTAIN MRN: [id number] DOB: [birth year] Today's Date: 10/31/2022   History of Present Illness The pt is a 61 y.o. unidentified homeless male presenting 9/28 after being found down by bystanders. 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 S/P coil embolization of R ICA aneurysm 9/28. Intubated 9/29. Trach placement 10/10.  Back to ICU 10/15 for respiratory failure, had IVC filter placed 10/26 and PEG.  No PMH on file.    PT Comments    Issued large left hand walker splint for pt use and affixed to walker in his room. Patient able to progress to ambulation with RW (instead of Fara Boros) with noted increased left lateral sway, especially as he fatigued. Patient doing much better with placement of LLE with no scissoring or near-scissoring noted.     Recommendations for follow up therapy are one component of a multi-disciplinary discharge planning process, led by the attending physician.  Recommendations may be updated based on patient status, additional functional criteria and insurance authorization.  Follow Up Recommendations  Skilled nursing-short term rehab (<3 hours/day) Can patient physically be transported by private vehicle: No   Assistance Recommended at Discharge Frequent or constant Supervision/Assistance  Patient can return home with the following Two people to help with walking and/or transfers;Two people to help with bathing/dressing/bathroom;Assistance with cooking/housework;Assistance with feeding;Direct supervision/assist for medications management;Direct supervision/assist for financial management;Help with stairs or ramp for entrance;Assist for transportation   Equipment Recommendations  Other (comment) (TBD)    Recommendations for Other Services       Precautions / Restrictions  Precautions Precautions: Fall Precaution Comments: PEG Restrictions Weight Bearing Restrictions: No     Mobility  Bed Mobility Overal bed mobility: Needs Assistance Bed Mobility: Supine to Sit     Supine to sit: Supervision, HOB elevated     General bed mobility comments: incr time and overuse of RUE to scoot out to EOB and reach feet to floor; eventually functionally uses LUE for boosting hips    Transfers Overall transfer level: Needs assistance Equipment used: Rolling walker (2 wheels) Transfers: Sit to/from Stand Sit to Stand: Min guard           General transfer comment: verbal and tactile cues to push up to stand; esp cues for use of LUE; assist to place left hand on/off walker splint    Ambulation/Gait Ambulation/Gait assistance: Mod assist, +2 safety/equipment Gait Distance (Feet): 80 Feet (seated rest; 180) Assistive device: Rolling walker (2 wheels) (with left walker splint to facilitate grip and weight bearing through LUE) Gait Pattern/deviations: Step-through pattern, Decreased stride length, Narrow base of support, Drifts right/left Gait velocity: decr     General Gait Details: pt required cues for proximity to RW; tends to push RW toward the left and requires assist to maneuver RW throughout gait; chair follow for safety as pt can be a poor judge of how far he can tolerate ambulating. Pt with increasing left lean of torso as fatiguing   Stairs             Wheelchair Mobility    Modified Rankin (Stroke Patients Only) Modified Rankin (Stroke Patients Only) Pre-Morbid Rankin Score: No symptoms Modified Rankin: Moderately severe disability     Balance Overall balance assessment: Needs assistance Sitting-balance support: Feet supported Sitting balance-Leahy Scale: Fair Sitting balance - Comments: EOB with feet supported   Standing balance support: Bilateral upper extremity supported Standing  balance-Leahy Scale: Poor Standing balance  comment: stood with bil UE support on RW without physical assist, but needs UE support                            Cognition Arousal/Alertness: Awake/alert Behavior During Therapy: Flat affect Overall Cognitive Status: Difficult to assess                                          Exercises      General Comments General comments (skin integrity, edema, etc.): Large left walker splint issued to patient and fastened to RW in room.      Pertinent Vitals/Pain Pain Assessment Pain Assessment: Faces Faces Pain Scale: No hurt    Home Living                          Prior Function            PT Goals (current goals can now be found in the care plan section) Acute Rehab PT Goals Patient Stated Goal: agrees to work on ambulation Time For Goal Achievement: 11/03/22 Potential to Achieve Goals: Good Progress towards PT goals: Progressing toward goals    Frequency    Min 2X/week      PT Plan Current plan remains appropriate    Co-evaluation              AM-PAC PT "6 Clicks" Mobility   Outcome Measure  Help needed turning from your back to your side while in a flat bed without using bedrails?: A Little Help needed moving from lying on your back to sitting on the side of a flat bed without using bedrails?: A Little Help needed moving to and from a bed to a chair (including a wheelchair)?: A Little Help needed standing up from a chair using your arms (e.g., wheelchair or bedside chair)?: A Little Help needed to walk in hospital room?: Total Help needed climbing 3-5 steps with a railing? : Total 6 Click Score: 14    End of Session Equipment Utilized During Treatment: Gait belt Activity Tolerance: Patient tolerated treatment well Patient left: with call bell/phone within reach;in chair;with chair alarm set   PT Visit Diagnosis: Other abnormalities of gait and mobility (R26.89);Hemiplegia and hemiparesis;Other symptoms and signs  involving the nervous system (R29.898);Muscle weakness (generalized) (M62.81) Hemiplegia - Right/Left: Left Hemiplegia - dominant/non-dominant: Non-dominant Hemiplegia - caused by: Nontraumatic intracerebral hemorrhage     Time: 1315-1341 PT Time Calculation (min) (ACUTE ONLY): 26 min  Charges:  $Gait Training: 23-37 mins                      Arby Barrette, PT Acute Rehabilitation Services  Office [phone]    Rexanne Mano 10/31/2022, 2:31 PM

## 2022-10-31 NOTE — Progress Notes (Signed)
Speech Language Pathology Treatment: Cognitive-Linquistic  Patient Details Name: Dustin Jones MRN: [id number] DOB: 1961-11-19 Today's Date: 10/31/2022 Time: 1215-1227 SLP Time Calculation (min) (ACUTE ONLY): 12 min  Assessment / Plan / Recommendation Clinical Impression  Pt seen for ongoing cognitive-linguistic treatment.  Pragmatic deficits continue with low vocal intensity, flat affect, and poor eye contact noted today.  Pt responded to questions but required additional processing time. Pt answered orientation questions with 50% accuracy. Pt accurately provided general location (hospital), but not specific (city or name of hospital).  Pt new year and time of day independently.  Pt did not know day of week and required cuing (recent holiday) to determine month.  Pt reassessed for some cognitive linguistic tasks.  Pt completed digit span reliably with 3 digits only, pt was 50% at 4 digits, and 0% at 5 digits.  Pt was able to repeat sentences and phrases with 100% accuracy despite prolonged processing time. Given severity of attention deficits memory and problem solving tasks were not attempted today. Pt agreeable to ongoing cognitive therapy. SLP will begin with lower level functions and hopefully advance as pt is able to engage more and for longer periods of time in therapy.    Pt again voices his contentment with mechanical soft diet.  RN reports no difficulty with POs.  Pt had completed 100% of midday meal on SLP arrival.  PT has no further swallowing needs. We will sign off for swallowing at this time. If pt desires to advance to regular texture diet, please reconsult speech at that time.     HPI HPI: Dustin Jones is a 61 y.o. male who is homeless, presenting Sep 22, 2022 after being found down by bystanders. 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 S/P coil embolization of R ICA  aneurysm 9/28. Intubated 9/29. Trach placement 10/10. Cortrak 10/11; G-tube 10/26.  No PMH on file. MBS 10/05/22 D3/thin, crushed meds      SLP Plan  Goals updated      Recommendations for follow up therapy are one component of a multi-disciplinary discharge planning process, led by the attending physician.  Recommendations may be updated based on patient status, additional functional criteria and insurance authorization.    Recommendations  Diet recommendations: Dysphagia 3 (mechanical soft);Thin liquid Liquids provided via: Cup;Straw Medication Administration: Other (Comment) (as tolerated, no specific precautions) Supervision: Staff to assist with self feeding;Patient able to self feed;Full supervision/cueing for compensatory strategies Compensations: Slow rate;Small sips/bites;Minimize environmental distractions;Follow solids with liquid Postural Changes and/or Swallow Maneuvers: Seated upright 90 degrees                Oral Care Recommendations: Oral care BID Follow Up Recommendations: Skilled nursing-short term rehab (<3 hours/day) Assistance recommended at discharge: Frequent or constant Supervision/Assistance SLP Visit Diagnosis: Cognitive communication deficit (J28.786) Plan: Goals updated           Kerrie Pleasure, MA, CCC-SLP Acute Rehabilitation Services Office: [phone] 10/31/2022, 12:27 PM

## 2022-11-01 LAB — GLUCOSE, CAPILLARY
Glucose-Capillary: 136 mg/dL — ABNORMAL HIGH (ref 70–99)
Glucose-Capillary: 135 mg/dL — ABNORMAL HIGH (ref 70–99)
Glucose-Capillary: 113 mg/dL — ABNORMAL HIGH (ref 70–99)
Glucose-Capillary: 118 mg/dL — ABNORMAL HIGH (ref 70–99)

## 2022-11-01 NOTE — Progress Notes (Signed)
Nutrition Follow-up  DOCUMENTATION CODES:  Obesity unspecified  INTERVENTION:  Continue current diet per SLP recommendations Nursing to assist with feeding patient Pt now with improved intake, will adjust back to prn bolus feeds. Bolus 1 can of Jevity 1.5 if pt eats </= 50% of his meal.  Each carton provides 355kcal, 15g of protein, and 134m of free water Continue 154mof free water q4h for hydration  NUTRITION DIAGNOSIS:  Inadequate oral intake related to inability to eat as evidenced by NPO status. - Remains applicable  GOAL:  Patient will meet greater than or equal to 90% of their needs - Progressing, met via TF and diet  MONITOR:  TF tolerance  REASON FOR ASSESSMENT:  Consult Enteral/tube feeding initiation and management  ASSESSMENT:  Pt with unknown PMH who is reportedly homeless admitted with ICH/R MCA ruptured aneurysm now s/p coiling and acute hypoxic respiratory failure PNA/ALI due to aspiration.  09/29 - TF started 10/10 - s/p trach placement 10/11 - s/p cortrak placement (tip gastric) 10/12 - weaned to trach collar 10/16 - transferred to ICU, back on vent support 10/18 - Initial PMV assessment 10/22 - Transferred out of ICU 10/26 - PEG placed 11/1 - MBS, advanced to DYS3 with thin liquids  Admit weight: 100 kg - unsure of accuracy, appears stated Current weight: 106.7kg  Pt resting in bedside chair states he is eating good. Does not give more than 1 word answers. Discussed intake with RN, reports great intake today and intake has improved over the last week. Will adjust TF back to as needed boluses.  Average Meal Intake: 11/1-11/6: 45% average intake x 12 recorded meals 11/17-11/21: No meals recorded, however per bolus feeding regimen, pt is routinely consuming <50% of his meal 11/22-11/28: 77% intake x 10 recorded meals  Nutritionally Relevant Medications: Scheduled Meds:  famotidine  20 mg Oral Daily   free water  150 mL Per Tube Q4H   insulin  aspart  0-15 Units Subcutaneous TID WC   Continuous Infusions:  JEVITY 1.5 CAL/FIBER 1,000 mL (10/31/22 2031)   Labs Reviewed  Diet Order:   Diet Order             DIET DYS 3 Room service appropriate? Yes with Assist; Fluid consistency: Thin  Diet effective now                   EDUCATION NEEDS:  No education needs have been identified at this time  Skin: WNL  Last BM:  11/27 - type 5  Height:  Ht Readings from Last 1 Encounters:  09/18/22 _0  (1.702 m)   Weight:  Wt Readings from Last 1 Encounters:  11/01/22 106.7 kg   BMI:  Body mass index is 36.84 kg/m.  Estimated Nutritional Needs:  Kcal:  2000-2200 Protein:  100-120 grams Fluid:  >2 L/day    RaRanell PatrickRD, LDN Clinical Dietitian RD pager # available in AMRiverbankAfter hours/weekend pager # available in AMHouston [facility]

## 2022-11-01 NOTE — Progress Notes (Signed)
[facility] PROGRESS NOTE   Dustin Jones EEF:[id number] DOB: 01/29/1961 DOA: 08/17/2022  PCP: Marliss Coots, NP  Brief History/Interval Summary: 61 year old with past medical history significant for hypertension, history of DVT admitted 9/28 as a code stroke, admitted to ICU with working diagnosis of intracranial hemorrhage right MCA ruptured aneurysm, has had prolonged hospitalization including multiple procedures, work-up including right ICA coil embolization of right ICA terminus aneurysm on 08/10/2022, tracheostomy by CCM on 10/10 and transferred to [facility] on 10/22. 9/28 admitted intracranial hemorrhage right MCA ruptured aneurysm status post CT head. 9/29: MRI brain large acute intraparenchymal hemorrhage extension into the right greater than left lateral ventricle, third ventricle and fourth ventricle. 9/29 developed fever and pneumonia, MSSA and respiratory culture white blood cell up to 56.  Treated. 10/15 transferred to the hospitalist service, transferred back to ICU for worsening respiratory status.  Was placed back on vent. 10/26; IVC filter and PEG tube placed by IR. 10/28: After conference call with family CODE STATUS was changed to DNR/DNI do not placed back on vent support. 11/01: Speech recommend Dysphagia 3 diet.  11/07 started Bactrim to cover for PNA.  11/16: Tracheostomy tube was changed to a #6 Shiley flex cuffless  11/19: trach removed by the patient, assessed by CCM, Dr. Valeta Harms, patient able to cough and clear his throat, phonate, recommended to observe without trach  Consultants: Critical care medicine   Subjective/Interval History: No complaints offered.   Assessment/Plan:  ICH (intracerebral hemorrhage) ([place]), right MCA ruptured aneurysm -Status post right ICA coil embolization of the right ICA terminus aneurysm on 08/08/2022 Left-sided weakness appears to be improving.   Continue with physical and Occupational Therapy.   Acute respiratory failure  with hypoxia secondary to pneumonia Pneumonia due to MSSA and Enterobacter Underwent tracheostomy by critical care medicine. Has completed multiple courses of antibiotics. Patient was followed by CCM for trach management On 11/6, patient had low-grade fever, leukocytosis, sputum cultures positive for Staph aureus, was placed on Bactrim.  Due to rising creatinine, Bactrim was changed over to meropenem on 11/11, has completed course of antibiotics. Self decannulated on 11/19, RT was unable to place trach back, was recommended to observe without trach by Dr. Valeta Harms. Seems to be doing well without tracheostomy.   Respiratory status is stable.   Acute kidney injury Creatinine peaked at 1.78 on 11/11.  After the patient was given IV fluids renal function has improved.  Monitor urine output.  Continue to hold ACE inhibitor.   Dysphagia Status post PEG tube placed by IR 10/26, was placed on tube feedings -Has made significant progress with oral intake, speech therapy recommended dysphagia 3 diet on 11/1 -Tolerating dysphagia 3 diet during the day, on nocturnal tube feeds Changed medications to oral route.   Bilateral DVTs - Not a candidate for anticoagulation due to [place] - status post IVC filter in 09/29/2022 by IR.   Essential hypertension Noted to be on amlodipine, hydralazine and metoprolol. Lisinopril was held due to AKI Blood pressure is reasonably well-controlled.   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  Has completed antibiotics course. Now has remained between 50-55 Previous rounding MD consulted hem-onc. D/w Dr Lindi Adie.  Low cytometry and CLL FISH panel was ordered. Flow cytometry: "CLL RELATED CLONE DETECTED . 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 Results for CCND1/IGH, chromosome 12, 13q and TP53 were normal. Deletion of ATM is  associated with a less favorable  prognosis in patients diagnosed with CLL. SPECIFIC FISH RESULTS:   ATM: ABNORMAL.       nuc ish 11q22.3(ATMx1)[67/100].   CCND1/IGH: NORMAL .       nuc ish 11q13(CCND1x2),14q32(IGHx2)[100].   12cen: NORMAL  .       nuc ish 12cen(D12Z3x2)[100].   13q: NORMAL.       nuc ish 13q14.3(DLEUx2),13q34(TFDP1x2)[100]  .   TP53: NORMAL .       nuc ish 17p13.1(TP53x2)[100]         This analysis is limited to abnormalities detectable by the specific probes included in the study. FISH results should be interpreted within the context of a full cytogenetic analysis and hematologic evaluation. " These findings were discussed with Dr. Lindi Adie.  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  Does not need any treatments.  Outpatient follow-up few weeks after discharge.    Diabetes mellitus type 2 Continue sliding scale, CBG stable   Fall on 11/13, again on 11/18 No obvious injuries noted.     Thrombocytopenia: Resolved Nonsustained V. tach: Continue with metoprolol.  Oral thrush: Received Diflucan 11/6 for 5 days.    Obesity Class 3  Estimated body mass index is 37.08 kg/m as calculated from the following:   Height as of this encounter: _0  (1.702 m).   Weight as of this encounter: 107.4 kg.   DVT Prophylaxis: Subcutaneous heparin Code Status: DNR Family Communication: No family at bedside Disposition Plan: Skilled nursing facility  Status is: Inpatient Remains inpatient appropriate because: Intracranial hemorrhage, dysphagia      Medications: Scheduled:  amLODipine  10 mg Oral Daily   arformoterol  15 mcg Nebulization BID   famotidine  20 mg Oral Daily   free water  150 mL Per Tube Q4H   heparin injection (subcutaneous)  5,000 Units Subcutaneous Q8H   hydrALAZINE  10 mg Oral BID   insulin aspart  0-15 Units Subcutaneous TID WC   metoprolol tartrate  50 mg Oral BID   mouth rinse  15 mL Mouth Rinse BID   revefenacin  175 mcg Nebulization Daily   Continuous:  feeding  supplement (JEVITY 1.5 CAL/FIBER) 1,000 mL (10/31/22 2031)   WGY:[id number] **OR** acetaminophen (TYLENOL) oral liquid 160 mg/5 mL **OR** acetaminophen, albuterol, fentaNYL (SUBLIMAZE) injection, mouth rinse, oxyCODONE  Antibiotics: Anti-infectives (From admission, onward)    Start     Dose/Rate Route Frequency Ordered Stop   10/16/22 0900  meropenem (MERREM) 1 g in sodium chloride 0.9 % 100 mL IVPB        1 g 200 mL/hr over 30 Minutes Intravenous Every 8 hours 10/16/22 0803 10/18/22 0700   10/15/22 1000  meropenem (MERREM) 1 g in sodium chloride 0.9 % 100 mL IVPB  Status:  Discontinued        1 g 200 mL/hr over 30 Minutes Intravenous Every 12 hours 10/15/22 0842 10/16/22 0803   10/11/22 1200  sulfamethoxazole-trimethoprim (BACTRIM DS) 800-160 MG per tablet 1 tablet  Status:  Discontinued        1 tablet Oral Every 12 hours 10/11/22 1106 10/15/22 0842   10/10/22 1000  fluconazole (DIFLUCAN) 40 MG/ML suspension 100 mg        100 mg Per Tube Daily 10/10/22 0835 10/14/22 1003   10/05/22 1415  fluconazole (DIFLUCAN) 40 MG/ML suspension 100 mg  Status:  Discontinued        100 mg Oral Daily 10/05/22 1319 10/10/22 0835   09/29/22 1445  ceFAZolin (ANCEF) IVPB 2g/100 mL premix  Status:  Discontinued        2 g 200 mL/hr over 30 Minutes Intravenous  Once 09/29/22 1346 10/12/22 1148   09/29/22 1145  ceFAZolin (ANCEF) IVPB 1 g/50 mL premix  Status:  Discontinued        1 g 100 mL/hr over 30 Minutes Intravenous To Radiology 09/29/22 1057 09/29/22 1059   09/29/22 1145  ceFAZolin (ANCEF) IVPB 2g/100 mL premix        2 g 200 mL/hr over 30 Minutes Intravenous  Once 09/29/22 1059 09/29/22 1159   09/14/22 1400  ceFEPIme (MAXIPIME) 2 g in sodium chloride 0.9 % 100 mL IVPB        2 g 200 mL/hr over 30 Minutes Intravenous Every 8 hours 09/14/22 1312 09/21/22 0922   09/10/22 0830  ceFAZolin (ANCEF) IVPB 1 g/50 mL premix  Status:  Discontinued        1 g 100 mL/hr over 30 Minutes Intravenous Every 8  hours 09/10/22 0738 09/10/22 0821   09/10/22 0830  ceFAZolin (ANCEF) IVPB 2g/100 mL premix  Status:  Discontinued        2 g 200 mL/hr over 30 Minutes Intravenous Every 8 hours 09/10/22 0821 09/14/22 1312   09/07/22 0815  ceFAZolin (ANCEF) IVPB 2g/100 mL premix        2 g 200 mL/hr over 30 Minutes Intravenous Every 8 hours 09/07/22 0807 09/08/22 1742   09/04/22 2200  Ampicillin-Sulbactam (UNASYN) 3 g in sodium chloride 0.9 % 100 mL IVPB  Status:  Discontinued        3 g 200 mL/hr over 30 Minutes Intravenous Every 24 hours 09/04/22 0722 09/04/22 1506   09/04/22 2200  ceFAZolin (ANCEF) IVPB 1 g/50 mL premix  Status:  Discontinued        1 g 100 mL/hr over 30 Minutes Intravenous Every 12 hours 09/04/22 1506 09/07/22 0807   09/02/22 1030  Ampicillin-Sulbactam (UNASYN) 3 g in sodium chloride 0.9 % 100 mL IVPB  Status:  Discontinued        3 g 200 mL/hr over 30 Minutes Intravenous Every 6 hours 09/02/22 1009 09/04/22 0722       Objective:  Vital Signs  Vitals:   11/01/22 0323 11/01/22 0459 11/01/22 0753 11/01/22 0844  BP: 121/75  (!) 153/99   Pulse: 73  66   Resp: 20  18   Temp: 98.2 F (36.8 C)  98.8 F (37.1 C)   TempSrc: Oral  Oral   SpO2: 93%  96% 96%  Weight:  106.7 kg    Height:        Intake/Output Summary (Last 24 hours) at 11/01/2022 1045 Last data filed at 11/01/2022 0636 Gross per 24 hour  Intake --  Output 1400 ml  Net -1400 ml    Filed Weights   10/26/22 0443 10/27/22 0416 11/01/22 0459  Weight: 107.4 kg 107.8 kg 106.7 kg   Patient is awake alert.  In no distress.   Lab Results:  Data Reviewed: I have personally reviewed following labs and reports of the imaging studies  CBC: Recent Labs  Lab 10/27/22 0221 10/31/22 0337  WBC 48.9* 47.1*  NEUTROABS 7.3  --   HGB 11.6* 11.7*  HCT 36.3* 36.8*  MCV 91.7 92.9  PLT 182 176     Basic Metabolic Panel: Recent Labs  Lab 10/27/22 0221 10/31/22 0337  NA 140 140  K 3.7 4.0  CL 102 108  CO2 24  23  GLUCOSE 129* 116*  BUN 18 12  CREATININE 1.02 0.91  CALCIUM 9.6 9.1  MG  --  1.8  PHOS  --  3.9     GFR: Estimated Creatinine Clearance: 99.2 mL/min (by C-G formula based on SCr of 0.91 mg/dL).  Liver Function Tests: Recent Labs  Lab 10/27/22 0221  AST 27  ALT 37  ALKPHOS 53  BILITOT 0.5  PROT 6.5  ALBUMIN 3.2*     CBG: Recent Labs  Lab 10/31/22 0614 10/31/22 1122 10/31/22 1644 10/31/22 2106 11/01/22 0607  GLUCAP 106* 103* 122* 112* 136*      Radiology Studies: No results found.     LOS: 28 days   Min [place] [employer] on [email or web address]  11/01/2022, 10:45 AM

## 2022-11-01 NOTE — Progress Notes (Signed)
Occupational Therapy Treatment Patient Details Name: Dustin Jones MRN: [id number] DOB: December 30, 1960 Today's Date: 11/01/2022   History of present illness The pt is a 61 y.o. unidentified homeless male presenting 9/28 after being found down by bystanders. 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 S/P coil embolization of R ICA aneurysm 9/28. Intubated 9/29. Trach placement 10/10.  Back to ICU 10/15 for respiratory failure, had IVC filter placed 10/26 and PEG.  No PMH on file.   OT comments  Patient received in supine and able to follow directions for getting to EOB with increased time. Patient stood from EOB with min guard assist and used RW with left walker splint to transfer to recliner with min assist. Patient stood at sink for grooming tasks using LUE to aide in support.  Patient led in AAROM to LUE to increase functional use with self care and functional transfers. Acute OT to continue to follow.    Recommendations for follow up therapy are one component of a multi-disciplinary discharge planning process, led by the attending physician.  Recommendations may be updated based on patient status, additional functional criteria and insurance authorization.    Follow Up Recommendations  Skilled nursing-short term rehab (<3 hours/day)     Assistance Recommended at Discharge Frequent or constant Supervision/Assistance  Patient can return home with the following  Two people to help with walking and/or transfers;Help with stairs or ramp for entrance;Assist for transportation;Direct supervision/assist for financial management;Direct supervision/assist for medications management;Assistance with cooking/housework;A lot of help with bathing/dressing/bathroom   Equipment Recommendations  Other (comment) (defer to next venue)    Recommendations for Other Services      Precautions /  Restrictions Precautions Precautions: Fall Precaution Comments: PEG Restrictions Weight Bearing Restrictions: No       Mobility Bed Mobility Overal bed mobility: Needs Assistance Bed Mobility: Supine to Sit     Supine to sit: Supervision, HOB elevated     General bed mobility comments: increased time and able to scoot bottom towards EOB    Transfers Overall transfer level: Needs assistance Equipment used: Rolling walker (2 wheels) Transfers: Sit to/from Stand Sit to Stand: Min guard     Step pivot transfers: Min assist     General transfer comment: walker splint for LUE with assistance for placement     Balance Overall balance assessment: Needs assistance Sitting-balance support: Feet supported Sitting balance-Leahy Scale: Fair     Standing balance support: Single extremity supported, Bilateral upper extremity supported, During functional activity Standing balance-Leahy Scale: Poor Standing balance comment: stood at sink with LUE on sink and patient using RUE for grooming and bathing tasks                           ADL either performed or assessed with clinical judgement   ADL Overall ADL's : Needs assistance/impaired     Grooming: Wash/dry face;Minimal assistance;Standing Grooming Details (indicate cue type and reason): stood at sink for grooming, declined brushing teeth but washed face with min assist     Lower Body Bathing: Minimal assistance;Sit to/from stand Lower Body Bathing Details (indicate cue type and reason): cleaned bottom while standing at sink with min assist to complete                       General ADL Comments: rested LUE on sink while standing at sink  Extremity/Trunk Assessment Upper Extremity Assessment RUE Deficits / Details: decreased strength and coordination. Able to reach face LUE Deficits / Details: 2+/5 shoulder flexion.            Vision       Perception     Praxis      Cognition  Arousal/Alertness: Awake/alert Behavior During Therapy: Flat affect Overall Cognitive Status: Difficult to assess                         Following Commands: Follows one step commands with increased time     Problem Solving: Slow processing, Requires verbal cues General Comments: increased time to follow commands        Exercises Exercises: General Upper Extremity General Exercises - Upper Extremity Shoulder Flexion: AAROM, Left, 10 reps, Seated Shoulder Extension: AAROM, Left Elbow Flexion: AAROM, Left, 10 reps, Seated Elbow Extension: AAROM, Left, 10 reps, Seated Digit Composite Flexion: AROM, Left, 10 reps Composite Extension: AROM, Left, 10 reps    Shoulder Instructions       General Comments      Pertinent Vitals/ Pain       Pain Assessment Pain Assessment: Faces Faces Pain Scale: No hurt Pain Intervention(s): Monitored during session  Home Living                                          Prior Functioning/Environment              Frequency  Min 2X/week        Progress Toward Goals  OT Goals(current goals can now be found in the care plan section)  Progress towards OT goals: Progressing toward goals  Acute Rehab OT Goals OT Goal Formulation: Patient unable to participate in goal setting Time For Goal Achievement: 11/04/22 Potential to Achieve Goals: Fair ADL Goals Pt Will Perform Grooming: with min assist;standing Pt Will Perform Upper Body Dressing: with min assist;sitting Pt Will Perform Lower Body Dressing: with mod assist;sit to/from stand Pt Will Transfer to Toilet: ambulating;bedside commode;with min assist Pt/caregiver will Perform Home Exercise Program: Left upper extremity;With minimal assist;With written HEP provided;Increased strength;Increased ROM Additional ADL Goal #1: Pt will complete 3 consecutive grooming tasks standing at sink without seated rest break Additional ADL Goal #2: Pt will demonstrate  upright posture without lateral lean during functional activity and be able to self correct with 1-2 cues  Plan Discharge plan remains appropriate    Co-evaluation                 AM-PAC OT "6 Clicks" Daily Activity     Outcome Measure   Help from another person eating meals?: A Little Help from another person taking care of personal grooming?: A Lot Help from another person toileting, which includes using toliet, bedpan, or urinal?: A Lot Help from another person bathing (including washing, rinsing, drying)?: A Lot Help from another person to put on and taking off regular upper body clothing?: A Lot Help from another person to put on and taking off regular lower body clothing?: Total 6 Click Score: 12    End of Session Equipment Utilized During Treatment: Gait belt;Rolling walker (2 wheels) (left walker splint for left hand)  OT Visit Diagnosis: Muscle weakness (generalized) (M62.81);Other symptoms and signs involving the nervous system (R29.898);Other symptoms and signs involving cognitive function   Activity Tolerance Patient tolerated  treatment well   Patient Left in chair;with call bell/phone within reach;with chair alarm set;Other (comment) (posey belt alarm)   Nurse Communication Mobility status;Other (comment) (posey belt applied)        Time: [id number] OT Time Calculation (min): 29 min  Charges: OT General Charges $OT Visit: 1 Visit OT Treatments $Self Care/Home Management : 8-22 mins $Therapeutic Exercise: 8-22 mins  Lodema Hong, OTA Acute Rehabilitation Services  Office [place] 11/01/2022, 1:57 PM

## 2022-11-01 NOTE — TOC Progression Note (Addendum)
Transition of Care [facility]) - Progression Note    Patient Details  Name: Dustin Jones MRN: [id number] Date of Birth: 1961/02/25  Transition of Care [facility] CM/SW Contact  Carley Hammed, [place] Phone Number: 11/01/2022, 1:33 PM  Clinical Narrative:     CSW spoke with [facility] today and confirmed pt is still considered skill-able and will likely need LTC after SNF. Facility and CSW discussed LOG and other barriers. Facility to have therapies review pt and if accepted will call back with pricing and next steps. TOC will continue to follow for DC needs.     2:45 CSW notified by [facility] that they can accept pt under an LOG. CSW advised Supervisor and will work to arrange appropriate transportation. CSW updated daughter who has concerns about the facilities ratings. CSW went over the continued barriers and discussed that many facilities had declined. Dtr asked about facilities now that pt is de cannulated. CSW advised that a bed had been offered after the trach was no longer a consideration. Pt had been considered by facilities since and no other offers have been made, other than in [facility]. Dtr noted understanding.TOC will continue to follow for DC needs.  Expected Discharge Plan and Services                                                 Social Determinants of Health (SDOH) Interventions    Readmission Risk Interventions     No data to display

## 2022-11-01 NOTE — Plan of Care (Signed)
  Problem: Safety: Goal: Ability to remain free from injury will improve Outcome: Progressing   Problem: Education: Goal: Knowledge of disease or condition will improve Outcome: Progressing Goal: Knowledge of secondary prevention will improve (SELECT ALL) Outcome: Progressing Goal: Knowledge of patient specific risk factors will improve (INDIVIDUALIZE FOR PATIENT) Outcome: Progressing   Problem: Intracerebral Hemorrhage Tissue Perfusion: Goal: Complications of Intracerebral Hemorrhage will be minimized Outcome: Progressing

## 2022-11-01 NOTE — Progress Notes (Signed)
Mobility Specialist: Progress Note   11/01/22 1600  Mobility  Activity Ambulated with assistance in hallway  Level of Assistance Minimal assist, patient does 75% or more  Assistive Device Front wheel walker  Distance Ambulated (ft) 120 ft  Activity Response Tolerated well  Mobility Referral Yes  $Mobility charge 1 Mobility   Pt received in the bed and agreeable to mobility. Mod I with bed mobility and minA to stand. Cues for RW proximity and upright posture. No c/o pain, dizziness, or SOB. Pt back to bed after session with call bell and phone in reach.   Dustin Jones Mobility Specialist Please contact via SecureChat or Rehab office at [phone]

## 2022-11-02 LAB — GLUCOSE, CAPILLARY
Glucose-Capillary: 119 mg/dL — ABNORMAL HIGH (ref 70–99)
Glucose-Capillary: 107 mg/dL — ABNORMAL HIGH (ref 70–99)
Glucose-Capillary: 91 mg/dL (ref 70–99)
Glucose-Capillary: 92 mg/dL (ref 70–99)

## 2022-11-02 MED ORDER — JEVITY 1.5 CAL/FIBER PO LIQD: 237.0000 mL | Freq: Three times a day (TID) | ORAL | Status: DC | PRN

## 2022-11-02 NOTE — TOC Progression Note (Addendum)
Transition of Care [name]) - Progression Note    Patient Details  Name: DAOUDA LONZO MRN: [id number] Date of Birth: 1961-03-21  Transition of Care [facility]) CM/SW Contact  Carley Hammed, [place] Phone Number: 11/02/2022, 12:42 PM  Clinical Narrative:     4:00 Pt to discharge to [facility] on Friday at 8 AM via [employer] [facility] [employer], who will provide a wheelchair. CSW notified Dtr that [facility] had declined and no other options are available. TOC will continue to follow  CSW received VM from dtr requesting a return call as she had questions regarding discharge. CSW returned call and dtr stated she would prefer the facility in [facility]. Dtr also asked if any facilities had been located in [place], there have not. She confirmed they were sent after pt was decannulated, they were. CSW went over the barriers again including an LOG accepting facility, and the length of time pt will have before his Medicaid is approved. CSW requested [facility] in [place] review again.  CSW received another phone call from dtr, asking who had consented to pt going to this facility. CSW advised that the plan has been for placement during this process, with the family stating they are in agreement. An appropriate facility has been located, so the consent now comes down to placement in a facility or taking the pt home to care for him at home. Dtr asked if this had been discussed and approved by pt. CSW advised that pt is still disoriented and does not have the capacity to consent or make decisions, which is why family is involved. CSW notified dtr that [facility] continues to review, but no other beds have been identified at this time. Pt is medically stable for discharge and has been for a while. TOC will continue to follow.       Expected Discharge Plan and Services                                                 Social Determinants of Health (SDOH) Interventions     Readmission Risk Interventions     No data to display

## 2022-11-02 NOTE — Progress Notes (Signed)
Mobility Specialist: Progress Note   11/02/22 1031  Mobility  Activity Ambulated with assistance in hallway  Level of Assistance Minimal assist, patient does 75% or more  Assistive Device Front wheel walker  Distance Ambulated (ft) 120 ft  Activity Response Tolerated well  Mobility Referral Yes  $Mobility charge 1 Mobility   Pt received in the bed and agreeable to mobility. Mod I with bed mobility and minA to stand. Physical assist for L hand placement on RW. Cues for upright posture and RW proximity. No c/o throughout. Pt to the chair after session with call bell in his lap. Chair alarm is on.   Braylynn Ghan Mobility Specialist Please contact via SecureChat or Rehab office at [phone]

## 2022-11-02 NOTE — Progress Notes (Signed)
[facility] PROGRESS NOTE   Dustin Jones CHE:[id number] DOB: 15-Jul-1961 DOA: 08/21/2022  PCP: Marliss Coots, NP  Brief History/Interval Summary: 61 year old with past medical history significant for hypertension, history of DVT admitted 9/28 as a code stroke, admitted to ICU with working diagnosis of intracranial hemorrhage right MCA ruptured aneurysm, has had prolonged hospitalization including multiple procedures, work-up including right ICA coil embolization of right ICA terminus aneurysm on 08/13/2022, tracheostomy by CCM on 10/10 and [facility]. 9/28 admitted intracranial hemorrhage right MCA ruptured aneurysm status post CT head. 9/29: MRI brain large acute intraparenchymal hemorrhage extension into the right greater than left lateral ventricle, third ventricle and fourth ventricle. 9/29 developed fever and pneumonia, MSSA and respiratory culture white blood cell up to 56.  Treated. 10/15 transferred to the hospitalist service, transferred back to ICU for worsening respiratory status.  Was placed back on vent. 10/26; IVC filter and PEG tube placed by IR. 10/28: After conference call with family CODE STATUS was changed to DNR/DNI do not placed back on vent support. 11/01: Speech recommend Dysphagia 3 diet.  11/07 started Bactrim to cover for PNA.  11/16: Tracheostomy tube was changed to a #6 Shiley flex cuffless  11/19: trach removed by the patient, assessed by CCM recommended to observe without trach 11/20-present: Plan for discharge on 11/04/2022 per case management -patient remains medically stable for discharge  Consultants: Critical care medicine   Subjective/Interval History: No complaints offered.   Assessment/Plan:  ICH (intracerebral hemorrhage) ([place]), right MCA ruptured aneurysm -Status post right ICA coil embolization of the right ICA terminus aneurysm on 09/03/2022 Left-sided weakness appears to be improving somewhat over the past few  weeks.   Continue with physical and Occupational Therapy.   Acute respiratory failure with hypoxia secondary to pneumonia, resolved Pneumonia due to MSSA and Enterobacter Underwent tracheostomy by critical care medicine. Has completed multiple courses of antibiotics. Patient was followed by CCM for trach management On 11/6, patient had low-grade fever, leukocytosis, sputum cultures positive for Staph aureus, was placed on Bactrim.  Due to rising creatinine, Bactrim was changed over to meropenem on 11/11, has completed course of antibiotics. Self decannulated on 11/19, RT was unable to place trach back, was recommended to observe without trach by Dr. Valeta Harms. Seems to be doing well without tracheostomy.   Respiratory status is stable.   Acute kidney injury, resolved Creatinine peaked at 1.78 on 11/11.  After the patient was given IV fluids renal function has improved.  Monitor urine output.  Continue to hold ACE inhibitor.   Dysphagia Status post PEG tube placed by IR 10/26, was placed on tube feedings -Has made significant progress with oral intake, speech therapy recommended dysphagia 3 diet on 11/1 -Tolerating dysphagia 3 diet during the day, on nocturnal tube feeds -Changed medications to oral route.   Bilateral DVTs - Not a candidate for anticoagulation due to [place] - status post IVC filter in 09/29/2022 by IR.   Essential hypertension Noted to be on amlodipine, hydralazine and metoprolol. Lisinopril was held due to AKI Blood pressure is reasonably well-controlled.   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  Has completed antibiotics course. Now has remained between 50-55 Previous rounding MD consulted hem-onc. D/w Dr Lindi Adie.  Low cytometry and CLL FISH panel was ordered. Flow cytometry: "CLL RELATED CLONE DETECTED . 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 Results for  CCND1/IGH, chromosome 12, 13q and TP53 were normal. Deletion of ATM is associated with a less favorable prognosis in patients diagnosed with CLL. SPECIFIC FISH RESULTS:   ATM: ABNORMAL.       nuc ish 11q22.3(ATMx1)[67/100].   CCND1/IGH: NORMAL .       nuc ish 11q13(CCND1x2),14q32(IGHx2)[100].   12cen: NORMAL  .       nuc ish 12cen(D12Z3x2)[100].   13q: NORMAL.       nuc ish 13q14.3(DLEUx2),13q34(TFDP1x2)[100]  .   TP53: NORMAL .       nuc ish 17p13.1(TP53x2)[100]         This analysis is limited to abnormalities detectable by the specific probes included in the study. FISH results should be interpreted within the context of a full cytogenetic analysis and hematologic evaluation. " These findings were discussed with Dr. Lindi Adie.  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  Does not need any treatments.  Outpatient follow-up few weeks after discharge.    Diabetes mellitus type 2 Continue sliding scale, CBG stable   Fall on 11/13, again on 11/18 No obvious injuries noted.     Thrombocytopenia: Resolved Nonsustained V. tach: Continue with metoprolol.  Oral thrush: Received Diflucan 11/6 for 5 days.    Obesity Class 3  Estimated body mass index is 37.08 kg/m as calculated from the following:   Height as of this encounter: _0  (1.702 m).   Weight as of this encounter: 107.4 kg.   DVT Prophylaxis: Subcutaneous heparin Code Status: DNR Family Communication: No family at bedside Disposition Plan: Skilled nursing facility 48 hours  Status is: Inpatient   Medications: Scheduled:  amLODipine  10 mg Oral Daily   arformoterol  15 mcg Nebulization BID   famotidine  20 mg Oral Daily   free water  150 mL Per Tube Q4H   heparin injection (subcutaneous)  5,000 Units Subcutaneous Q8H   hydrALAZINE  10 mg Oral BID   insulin aspart  0-15 Units Subcutaneous TID WC   metoprolol tartrate  50 mg Oral BID   mouth rinse  15 mL Mouth Rinse BID   revefenacin  175 mcg Nebulization Daily    Continuous:  feeding supplement (JEVITY 1.5 CAL/FIBER) 1,000 mL (11/01/22 1956)   CBS:[id number] **OR** acetaminophen (TYLENOL) oral liquid 160 mg/5 mL **OR** acetaminophen, albuterol, fentaNYL (SUBLIMAZE) injection, mouth rinse, oxyCODONE  Antibiotics: Anti-infectives (From admission, onward)    Start     Dose/Rate Route Frequency Ordered Stop   10/16/22 0900  meropenem (MERREM) 1 g in sodium chloride 0.9 % 100 mL IVPB        1 g 200 mL/hr over 30 Minutes Intravenous Every 8 hours 10/16/22 0803 10/18/22 0700   10/15/22 1000  meropenem (MERREM) 1 g in sodium chloride 0.9 % 100 mL IVPB  Status:  Discontinued        1 g 200 mL/hr over 30 Minutes Intravenous Every 12 hours 10/15/22 0842 10/16/22 0803   10/11/22 1200  sulfamethoxazole-trimethoprim (BACTRIM DS) 800-160 MG per tablet 1 tablet  Status:  Discontinued        1 tablet Oral Every 12 hours 10/11/22 1106 10/15/22 0842   10/10/22 1000  fluconazole (DIFLUCAN) 40 MG/ML suspension 100 mg        100 mg Per Tube Daily 10/10/22 0835 10/14/22 1003   10/05/22 1415  fluconazole (DIFLUCAN) 40 MG/ML suspension 100 mg  Status:  Discontinued        100 mg Oral Daily 10/05/22 1319 10/10/22 0835  09/29/22 1445  ceFAZolin (ANCEF) IVPB 2g/100 mL premix  Status:  Discontinued        2 g 200 mL/hr over 30 Minutes Intravenous  Once 09/29/22 1346 10/12/22 1148   09/29/22 1145  ceFAZolin (ANCEF) IVPB 1 g/50 mL premix  Status:  Discontinued        1 g 100 mL/hr over 30 Minutes Intravenous To Radiology 09/29/22 1057 09/29/22 1059   09/29/22 1145  ceFAZolin (ANCEF) IVPB 2g/100 mL premix        2 g 200 mL/hr over 30 Minutes Intravenous  Once 09/29/22 1059 09/29/22 1159   09/14/22 1400  ceFEPIme (MAXIPIME) 2 g in sodium chloride 0.9 % 100 mL IVPB        2 g 200 mL/hr over 30 Minutes Intravenous Every 8 hours 09/14/22 1312 09/21/22 0922   09/10/22 0830  ceFAZolin (ANCEF) IVPB 1 g/50 mL premix  Status:  Discontinued        1 g 100 mL/hr over 30  Minutes Intravenous Every 8 hours 09/10/22 0738 09/10/22 0821   09/10/22 0830  ceFAZolin (ANCEF) IVPB 2g/100 mL premix  Status:  Discontinued        2 g 200 mL/hr over 30 Minutes Intravenous Every 8 hours 09/10/22 0821 09/14/22 1312   09/07/22 0815  ceFAZolin (ANCEF) IVPB 2g/100 mL premix        2 g 200 mL/hr over 30 Minutes Intravenous Every 8 hours 09/07/22 0807 09/08/22 1742   09/04/22 2200  Ampicillin-Sulbactam (UNASYN) 3 g in sodium chloride 0.9 % 100 mL IVPB  Status:  Discontinued        3 g 200 mL/hr over 30 Minutes Intravenous Every 24 hours 09/04/22 0722 09/04/22 1506   09/04/22 2200  ceFAZolin (ANCEF) IVPB 1 g/50 mL premix  Status:  Discontinued        1 g 100 mL/hr over 30 Minutes Intravenous Every 12 hours 09/04/22 1506 09/07/22 0807   09/02/22 1030  Ampicillin-Sulbactam (UNASYN) 3 g in sodium chloride 0.9 % 100 mL IVPB  Status:  Discontinued        3 g 200 mL/hr over 30 Minutes Intravenous Every 6 hours 09/02/22 1009 09/04/22 0722       Objective:  Vital Signs  Vitals:   11/01/22 1946 11/01/22 2328 11/02/22 0337 11/02/22 0500  BP:  114/85 135/89   Pulse:  73 76   Resp:  17 16   Temp:  98.9 F (37.2 C) 98.9 F (37.2 C)   TempSrc:  Oral Oral   SpO2: 96% 96% 90%   Weight:    106.2 kg  Height:        Intake/Output Summary (Last 24 hours) at 11/02/2022 0747 Last data filed at 11/02/2022 0334 Gross per 24 hour  Intake 50 ml  Output 400 ml  Net -350 ml    Filed Weights   10/27/22 0416 11/01/22 0459 11/02/22 0500  Weight: 107.8 kg 106.7 kg 106.2 kg   Patient is awake alert.  In no distress.   Lab Results:  Data Reviewed: I have personally reviewed following labs and reports of the imaging studies  CBC: Recent Labs  Lab 10/27/22 0221 10/31/22 0337  WBC 48.9* 47.1*  NEUTROABS 7.3  --   HGB 11.6* 11.7*  HCT 36.3* 36.8*  MCV 91.7 92.9  PLT 182 176     Basic Metabolic Panel: Recent Labs  Lab 10/27/22 0221 10/31/22 0337  NA 140 140  K 3.7  4.0  CL 102 108  CO2 24 23  GLUCOSE 129* 116*  BUN 18 12  CREATININE 1.02 0.91  CALCIUM 9.6 9.1  MG  --  1.8  PHOS  --  3.9     GFR: Estimated Creatinine Clearance: 99 mL/min (by C-G formula based on SCr of 0.91 mg/dL).  Liver Function Tests: Recent Labs  Lab 10/27/22 0221  AST 27  ALT 37  ALKPHOS 53  BILITOT 0.5  PROT 6.5  ALBUMIN 3.2*     CBG: Recent Labs  Lab 11/01/22 0607 11/01/22 1121 11/01/22 1613 11/01/22 2137 11/02/22 0609  GLUCAP 136* 135* 113* 118* 119*      Radiology Studies: No results found.     LOS: 40 days   [facility] Pager on [email or web address]  11/02/2022, 7:47 AM

## 2022-11-02 NOTE — Plan of Care (Signed)
  Problem: Clinical Measurements: Goal: Diagnostic test results will improve Outcome: Progressing Goal: Respiratory complications will improve Outcome: Progressing Goal: Cardiovascular complication will be avoided Outcome: Progressing   Problem: Activity: Goal: Risk for activity intolerance will decrease Outcome: Progressing   Problem: Nutrition: Goal: Adequate nutrition will be maintained Outcome: Progressing   

## 2022-11-03 ENCOUNTER — Other Ambulatory Visit (HOSPITAL_COMMUNITY): Payer: Self-pay

## 2022-11-03 LAB — GLUCOSE, CAPILLARY
Glucose-Capillary: 104 mg/dL — ABNORMAL HIGH (ref 70–99)
Glucose-Capillary: 112 mg/dL — ABNORMAL HIGH (ref 70–99)
Glucose-Capillary: 94 mg/dL (ref 70–99)
Glucose-Capillary: 110 mg/dL — ABNORMAL HIGH (ref 70–99)

## 2022-11-03 MED ORDER — OXYCODONE HCL 5 MG/5ML PO SOLN: 2.5000 mg | Freq: Two times a day (BID) | ORAL | Status: DC | PRN

## 2022-11-03 MED ORDER — ACETAMINOPHEN 160 MG/5ML PO SOLN
650.0000 mg | ORAL | 0 refills | Status: AC | PRN
  Filled 2022-11-03: qty 120, 1d supply, fill #0

## 2022-11-03 MED ORDER — REVEFENACIN 175 MCG/3ML IN SOLN
175.0000 ug | Freq: Every day | RESPIRATORY_TRACT | 0 refills | Status: AC
  Filled 2022-11-03: qty 90, 30d supply, fill #0

## 2022-11-03 MED ORDER — AMLODIPINE BESYLATE 10 MG PO TABS
10.0000 mg | ORAL_TABLET | Freq: Every day | ORAL | 0 refills | Status: DC
  Filled 2022-11-03: qty 30, 30d supply, fill #0

## 2022-11-03 MED ORDER — METOPROLOL TARTRATE 50 MG PO TABS
50.0000 mg | ORAL_TABLET | Freq: Two times a day (BID) | ORAL | 0 refills | Status: DC
  Filled 2022-11-03: qty 60, 30d supply, fill #0

## 2022-11-03 MED ORDER — FAMOTIDINE 20 MG PO TABS
20.0000 mg | ORAL_TABLET | Freq: Every day | ORAL | 0 refills | Status: AC
  Filled 2022-11-03: qty 30, 30d supply, fill #0

## 2022-11-03 MED ORDER — HYDRALAZINE HCL 10 MG PO TABS
10.0000 mg | ORAL_TABLET | Freq: Two times a day (BID) | ORAL | 0 refills | Status: AC
  Filled 2022-11-03: qty 100, 50d supply, fill #0

## 2022-11-03 NOTE — Progress Notes (Signed)
[facility] PROGRESS NOTE   Dustin Jones KJZ:[id number] DOB: 16-Dec-1960 DOA: 09/03/2022  PCP: Marliss Coots, NP  Brief History/Interval Summary: 61 year old with past medical history significant for hypertension, history of DVT admitted 9/28 as a code stroke, admitted to ICU with working diagnosis of intracranial hemorrhage right MCA ruptured aneurysm, has had prolonged hospitalization including multiple procedures, work-up including right ICA coil embolization of right ICA terminus aneurysm on 08/21/2022, tracheostomy by CCM on 10/10 and transferred to [facility] on 10/22. 9/28 admitted intracranial hemorrhage right MCA ruptured aneurysm status post CT head. 9/29: MRI brain large acute intraparenchymal hemorrhage extension into the right greater than left lateral ventricle, third ventricle and fourth ventricle. 9/29 developed fever and pneumonia, MSSA and respiratory culture white blood cell up to 56.  Treated. 10/15 transferred to the hospitalist service, transferred back to ICU for worsening respiratory status.  Was placed back on vent. 10/26; IVC filter and PEG tube placed by IR. 10/28: After conference call with family CODE STATUS was changed to DNR/DNI do not placed back on vent support. 11/01: Speech recommend Dysphagia 3 diet.  11/07 started Bactrim to cover for PNA.  11/16: Tracheostomy tube was changed to a #6 Shiley flex cuffless  11/19: trach removed by the patient, assessed by CCM recommended to observe without trach 11/20-present: Plan for discharge on 11/04/2022 per case management -patient remains medically stable for discharge  Consultants: Critical care medicine   Subjective/Interval History: No complaints offered.   Assessment/Plan:  ICH (intracerebral hemorrhage) ([place]), right MCA ruptured aneurysm -Status post right ICA coil embolization of the right ICA terminus aneurysm on 08/21/2022 Left-sided weakness appears to be improving somewhat over the past few  weeks.   Continue with physical and Occupational Therapy.   Acute respiratory failure with hypoxia secondary to pneumonia, resolved Pneumonia due to MSSA and Enterobacter Underwent tracheostomy by critical care medicine. Has completed multiple courses of antibiotics. Patient was followed by CCM for trach management On 11/6, patient had low-grade fever, leukocytosis, sputum cultures positive for Staph aureus, was placed on Bactrim.  Due to rising creatinine, Bactrim was changed over to meropenem on 11/11, has completed course of antibiotics. Self decannulated on 11/19, RT was unable to place trach back, was recommended to observe without trach by Dr. Valeta Harms. Seems to be doing well without tracheostomy.   Respiratory status is stable.   Acute kidney injury, resolved Creatinine peaked at 1.78 on 11/11.  After the patient was given IV fluids renal function has improved.  Monitor urine output.  Continue to hold ACE inhibitor.   Dysphagia Status post PEG tube placed by IR 10/26, was placed on tube feedings -Has made significant progress with oral intake, speech therapy recommended dysphagia 3 diet on 11/1 -Tolerating dysphagia 3 diet during the day, on nocturnal tube feeds -Changed medications to oral route.   Bilateral DVTs - Not a candidate for anticoagulation due to [place] - status post IVC filter in 09/29/2022 by IR.   Essential hypertension Noted to be on amlodipine, hydralazine and metoprolol. Lisinopril was held due to AKI Blood pressure is reasonably well-controlled.   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  Has completed antibiotics course. Now has remained between 50-55 Previous rounding MD consulted hem-onc. D/w Dr Lindi Adie.  Low cytometry and CLL FISH panel was ordered. Flow cytometry: "CLL RELATED CLONE DETECTED . 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 Results for  CCND1/IGH, chromosome 12, 13q and TP53 were normal. Deletion of ATM is associated with a less favorable prognosis in patients diagnosed with CLL. SPECIFIC FISH RESULTS:   ATM: ABNORMAL.       nuc ish 11q22.3(ATMx1)[67/100].   CCND1/IGH: NORMAL .       nuc ish 11q13(CCND1x2),14q32(IGHx2)[100].   12cen: NORMAL  .       nuc ish 12cen(D12Z3x2)[100].   13q: NORMAL.       nuc ish 13q14.3(DLEUx2),13q34(TFDP1x2)[100]  .   TP53: NORMAL .       nuc ish 17p13.1(TP53x2)[100]         This analysis is limited to abnormalities detectable by the specific probes included in the study. FISH results should be interpreted within the context of a full cytogenetic analysis and hematologic evaluation. " These findings were discussed with Dr. Lindi Adie.  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  Does not need any treatments.  Outpatient follow-up few weeks after discharge.    Diabetes mellitus type 2 Continue sliding scale, CBG stable   Fall on 11/13, again on 11/18 No obvious injuries noted.     Thrombocytopenia: Resolved Nonsustained V. tach: Continue with metoprolol.  Oral thrush: Received Diflucan 11/6 for 5 days.    Obesity Class 3  Estimated body mass index is 37.08 kg/m as calculated from the following:   Height as of this encounter: _0  (1.702 m).   Weight as of this encounter: 107.4 kg.   DVT Prophylaxis: Subcutaneous heparin Code Status: DNR Family Communication: No family at bedside Disposition Plan: Skilled nursing facility 48 hours  Status is: Inpatient   Medications: Scheduled:  amLODipine  10 mg Oral Daily   arformoterol  15 mcg Nebulization BID   famotidine  20 mg Oral Daily   free water  150 mL Per Tube Q4H   heparin injection (subcutaneous)  5,000 Units Subcutaneous Q8H   hydrALAZINE  10 mg Oral BID   insulin aspart  0-15 Units Subcutaneous TID WC   metoprolol tartrate  50 mg Oral BID   mouth rinse  15 mL Mouth Rinse BID   revefenacin  175 mcg Nebulization Daily    Continuous:  VOZ:[id number] **OR** acetaminophen (TYLENOL) oral liquid 160 mg/5 mL **OR** acetaminophen, albuterol, feeding supplement (JEVITY 1.5 CAL/FIBER), fentaNYL (SUBLIMAZE) injection, mouth rinse, oxyCODONE  Antibiotics: Anti-infectives (From admission, onward)    Start     Dose/Rate Route Frequency Ordered Stop   10/16/22 0900  meropenem (MERREM) 1 g in sodium chloride 0.9 % 100 mL IVPB        1 g 200 mL/hr over 30 Minutes Intravenous Every 8 hours 10/16/22 0803 10/18/22 0700   10/15/22 1000  meropenem (MERREM) 1 g in sodium chloride 0.9 % 100 mL IVPB  Status:  Discontinued        1 g 200 mL/hr over 30 Minutes Intravenous Every 12 hours 10/15/22 0842 10/16/22 0803   10/11/22 1200  sulfamethoxazole-trimethoprim (BACTRIM DS) 800-160 MG per tablet 1 tablet  Status:  Discontinued        1 tablet Oral Every 12 hours 10/11/22 1106 10/15/22 0842   10/10/22 1000  fluconazole (DIFLUCAN) 40 MG/ML suspension 100 mg        100 mg Per Tube Daily 10/10/22 0835 10/14/22 1003   10/05/22 1415  fluconazole (DIFLUCAN) 40 MG/ML suspension 100 mg  Status:  Discontinued        100 mg Oral Daily 10/05/22 1319 10/10/22 0835   09/29/22 1445  ceFAZolin (ANCEF) IVPB  2g/100 mL premix  Status:  Discontinued        2 g 200 mL/hr over 30 Minutes Intravenous  Once 09/29/22 1346 10/12/22 1148   09/29/22 1145  ceFAZolin (ANCEF) IVPB 1 g/50 mL premix  Status:  Discontinued        1 g 100 mL/hr over 30 Minutes Intravenous To Radiology 09/29/22 1057 09/29/22 1059   09/29/22 1145  ceFAZolin (ANCEF) IVPB 2g/100 mL premix        2 g 200 mL/hr over 30 Minutes Intravenous  Once 09/29/22 1059 09/29/22 1159   09/14/22 1400  ceFEPIme (MAXIPIME) 2 g in sodium chloride 0.9 % 100 mL IVPB        2 g 200 mL/hr over 30 Minutes Intravenous Every 8 hours 09/14/22 1312 09/21/22 0922   09/10/22 0830  ceFAZolin (ANCEF) IVPB 1 g/50 mL premix  Status:  Discontinued        1 g 100 mL/hr over 30 Minutes Intravenous Every 8  hours 09/10/22 0738 09/10/22 0821   09/10/22 0830  ceFAZolin (ANCEF) IVPB 2g/100 mL premix  Status:  Discontinued        2 g 200 mL/hr over 30 Minutes Intravenous Every 8 hours 09/10/22 0821 09/14/22 1312   09/07/22 0815  ceFAZolin (ANCEF) IVPB 2g/100 mL premix        2 g 200 mL/hr over 30 Minutes Intravenous Every 8 hours 09/07/22 0807 09/08/22 1742   09/04/22 2200  Ampicillin-Sulbactam (UNASYN) 3 g in sodium chloride 0.9 % 100 mL IVPB  Status:  Discontinued        3 g 200 mL/hr over 30 Minutes Intravenous Every 24 hours 09/04/22 0722 09/04/22 1506   09/04/22 2200  ceFAZolin (ANCEF) IVPB 1 g/50 mL premix  Status:  Discontinued        1 g 100 mL/hr over 30 Minutes Intravenous Every 12 hours 09/04/22 1506 09/07/22 0807   09/02/22 1030  Ampicillin-Sulbactam (UNASYN) 3 g in sodium chloride 0.9 % 100 mL IVPB  Status:  Discontinued        3 g 200 mL/hr over 30 Minutes Intravenous Every 6 hours 09/02/22 1009 09/04/22 0722       Objective:  Vital Signs  Vitals:   11/02/22 1638 11/02/22 1941 11/02/22 2035 11/03/22 0359  BP: (!) 131/95 (!) 123/92  (!) 141/92  Pulse: 68 75  79  Resp: _0 Temp: 99.2 F (37.3 C) 98.6 F (37 C)  98.3 F (36.8 C)  TempSrc: Oral Oral  Oral  SpO2: 98% 93% 96% 95%  Weight:      Height:        Intake/Output Summary (Last 24 hours) at 11/03/2022 0723 Last data filed at 11/03/2022 0300 Gross per 24 hour  Intake 240 ml  Output 1450 ml  Net -1210 ml    Filed Weights   10/27/22 0416 11/01/22 0459 11/02/22 0500  Weight: 107.8 kg 106.7 kg 106.2 kg   Patient is awake alert.  In no distress.   Lab Results:  Data Reviewed: I have personally reviewed following labs and reports of the imaging studies  CBC: Recent Labs  Lab 10/31/22 0337  WBC 47.1*  HGB 11.7*  HCT 36.8*  MCV 92.9  PLT 176     Basic Metabolic Panel: Recent Labs  Lab 10/31/22 0337  NA 140  K 4.0  CL 108  CO2 23  GLUCOSE 116*  BUN 12  CREATININE 0.91  CALCIUM 9.1   MG 1.8  PHOS  3.9     GFR: Estimated Creatinine Clearance: 99 mL/min (by C-G formula based on SCr of 0.91 mg/dL).  Liver Function Tests: No results for input(s): "AST", "ALT", "ALKPHOS", "BILITOT", "PROT", "ALBUMIN" in the last 168 hours.   CBG: Recent Labs  Lab 11/02/22 0609 11/02/22 1134 11/02/22 1639 11/02/22 2258 11/03/22 0624  GLUCAP 119* 107* 91 92 104*      Radiology Studies: No results found.     LOS: 9 days   [place] [facility] Pager on [email or web address]  11/03/2022, 7:23 AM

## 2022-11-03 NOTE — Progress Notes (Signed)
Occupational Therapy Treatment Patient Details Name: Dustin Jones MRN: [id number] DOB: 01/26/1961 Today's Date: 11/03/2022   History of present illness The pt is a 61 y.o. unidentified homeless male presenting 9/28 after being found down by bystanders. 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 S/P coil embolization of R ICA aneurysm 9/28. Intubated 9/29. Trach placement 10/10.  Back to ICU 10/15 for respiratory failure, had IVC filter placed 10/26 and PEG.  No PMH on file.   OT comments  Pt progressing towards established OT goals. Pt performing functional mobility to sink with up to min a for balance. Pt washing hands at sink and observed with max use of compensatory strategies to use RUE rather than LUE due to LUE weakness and and decreased attention to L side of body. Pt requiring max step by step cues for sequencing and technique during LUE exercises to optimize strength and ROM including shoulder flexion/ext, elbow flexion/ext, wrist flexion/ext, composite digit flexion, and shoulder shrugs. OT facilitating decreased tightness and optimized mobility in shoulder girdle/to scapula with scapular elevation, depression, protraction, retraction, upward, and downward rotation.    Recommendations for follow up therapy are one component of a multi-disciplinary discharge planning process, led by the attending physician.  Recommendations may be updated based on patient status, additional functional criteria and insurance authorization.    Follow Up Recommendations  Skilled nursing-short term rehab (<3 hours/day)     Assistance Recommended at Discharge Frequent or constant Supervision/Assistance  Patient can return home with the following  Help with stairs or ramp for entrance;Assist for transportation;Direct supervision/assist for financial management;Direct supervision/assist for  medications management;Assistance with cooking/housework;A lot of help with bathing/dressing/bathroom;A lot of help with walking and/or transfers   Equipment Recommendations  Other (comment) (defer to next venue of care)    Recommendations for Other Services      Precautions / Restrictions Precautions Precautions: Fall Precaution Comments: PEG Restrictions Weight Bearing Restrictions: No       Mobility Bed Mobility               General bed mobility comments: In recliner on arrival and departure    Transfers Overall transfer level: Needs assistance Equipment used: Rolling walker (2 wheels) Transfers: Sit to/from Stand Sit to Stand: Min guard           General transfer comment: walker splint for LUE with assistance for placement     Balance Overall balance assessment: Needs assistance Sitting-balance support: Feet supported Sitting balance-Leahy Scale: Fair Sitting balance - Comments: sitting edge of recliner during UE exercises   Standing balance support: Single extremity supported, Bilateral upper extremity supported, During functional activity Standing balance-Leahy Scale: Poor Standing balance comment: Pt requires min A to stand without UE support                           ADL either performed or assessed with clinical judgement   ADL Overall ADL's : Needs assistance/impaired     Grooming: Wash/dry hands;Minimal assistance;Standing Grooming Details (indicate cue type and reason): Pt demonstrates some reluctance to perform BUE tasks due to decreased balance. Min A for balance when pt using BUE for short bouts.                             Functional mobility during ADLs: Minimal assistance;Rolling walker (2 wheels) General ADL  Comments: Min A with saddle splint on L for grasp. Pt with intermittent min A due to continues decreased coordination of LE    Extremity/Trunk Assessment Upper Extremity Assessment Upper Extremity  Assessment: LUE deficits/detail RUE Deficits / Details: decreased strength and coordination. Able to reach face LUE Deficits / Details: 2+/5 shoulder flexion. edema in hand LUE Coordination: decreased fine motor;decreased gross motor   Lower Extremity Assessment Lower Extremity Assessment: Defer to PT evaluation        Vision       Perception     Praxis      Cognition Arousal/Alertness: Awake/alert Behavior During Therapy: Flat affect Overall Cognitive Status: Difficult to assess Area of Impairment: Following commands, Problem solving, Awareness, Safety/judgement, Attention                   Current Attention Level: Sustained   Following Commands: Follows one step commands with increased time Safety/Judgement: Decreased awareness of deficits   Problem Solving: Slow processing, Requires verbal cues, Requires tactile cues General Comments: increased time to follow commands; despite repeated education, continues to require cues for hand sequencing with sit to stand to RW. Also continues to require, max cues during all UE exercises        Exercises Exercises: General Upper Extremity General Exercises - Upper Extremity Shoulder Flexion: AAROM, Left, 10 reps, Seated Shoulder Extension: AAROM, Left Elbow Flexion: AAROM, Left, 10 reps, Seated Elbow Extension: AAROM, Left, 10 reps, Seated Wrist Flexion: AAROM, Left, Seated Wrist Extension: AAROM, Left, Seated Digit Composite Flexion: AROM, Left, 10 reps Composite Extension: AROM, Left, 10 reps    Shoulder Instructions       General Comments Large L walker splint in room    Pertinent Vitals/ Pain       Pain Assessment Pain Assessment: Faces Faces Pain Scale: Hurts a little bit Pain Location: L hand with ROM Pain Descriptors / Indicators: Grimacing Pain Intervention(s): Limited activity within patient's tolerance, Monitored during session, Relaxation  Home Living                                           Prior Functioning/Environment              Frequency  Min 2X/week        Progress Toward Goals  OT Goals(current goals can now be found in the care plan section)  Progress towards OT goals: Progressing toward goals  Acute Rehab OT Goals Patient Stated Goal: stay in recliner OT Goal Formulation: Patient unable to participate in goal setting ADL Goals Pt Will Perform Grooming: standing;with min guard assist Pt Will Perform Upper Body Dressing: with min assist;sitting Pt Will Perform Lower Body Dressing: with min assist;sit to/from stand;with adaptive equipment Pt Will Transfer to Toilet: ambulating;with supervision;bedside commode Additional ADL Goal #2: Pt will follow simple two step command during ADL or UE exercise with no more than 2 cues  Plan Discharge plan remains appropriate;Frequency remains appropriate    Co-evaluation                 AM-PAC OT "6 Clicks" Daily Activity     Outcome Measure   Help from another person eating meals?: A Little Help from another person taking care of personal grooming?: A Lot Help from another person toileting, which includes using toliet, bedpan, or urinal?: A Lot Help from another person bathing (including washing, rinsing,  drying)?: A Lot Help from another person to put on and taking off regular upper body clothing?: A Lot Help from another person to put on and taking off regular lower body clothing?: Total 6 Click Score: 12    End of Session Equipment Utilized During Treatment: Gait belt;Rolling walker (2 wheels);Other (comment) (L walker splint for L hand)  OT Visit Diagnosis: Muscle weakness (generalized) (M62.81);Other symptoms and signs involving the nervous system (R29.898);Other symptoms and signs involving cognitive function   Activity Tolerance Patient tolerated treatment well   Patient Left in chair;with call bell/phone within reach;with chair alarm set;Other (comment) (Posey belt alarm)    Nurse Communication Mobility status;Other (comment) (posey belt applied. Affect)        Time: 2458-0998 OT Time Calculation (min): 25 min  Charges: OT General Charges $OT Visit: 1 Visit OT Treatments $Self Care/Home Management : 8-22 mins $Therapeutic Exercise: 8-22 mins  Tyler Deis, OTR/L [facility] Acute Rehabilitation Office: [phone]  Myrla Halsted 11/03/2022, 2:00 PM

## 2022-11-03 NOTE — Progress Notes (Signed)
Mobility Specialist: Progress Note   11/03/22 1418  Mobility  Activity Ambulated with assistance in hallway  Level of Assistance Minimal assist, patient does 75% or more  Assistive Device Front wheel walker  Distance Ambulated (ft) 200 ft  Activity Response Tolerated well  Mobility Referral Yes  $Mobility charge 1 Mobility   Pt received in the chair and agreeable to mobility. MinA to stand as well as to place L hand on RW. No c/o throughout. Stopped x1 for brief standing break to adjust L hand on RW. Pt back to the chair per request with call bell at his side. Chair alarm is on.   Casee Knepp Mobility Specialist Please contact via SecureChat or Rehab office at [phone]

## 2022-11-03 NOTE — TOC Progression Note (Signed)
Transition of Care [facility]) - Progression Note    Patient Details  Name: KEAGON GLASCOE MRN: [id number] Date of Birth: 09-01-1961  Transition of Care [facility]) CM/SW Contact  Mearl Latin, LCSW Phone Number: 11/03/2022, 2:37 PM  Clinical Narrative:    12:12pm-CSW received call from [place] at [facility]. She stated that they will need patient's daughter to complete paperwork prior to his arrival and that they have not received the LOG info yet.  CSW spoke with [facility] Director who has no received the LOG form. CSW completed form and CMA emailed to [place] at [email or web address]   CSW Milburn spoke with patient's daughter and obtained her email information and provided it to [place] for paperwork to be sent.  2:45pm-Amelia stated the log needs to state medicaid rate of $319.52/day, PT/OT $200 per discipline and every 15 mins $75. CSW contacted [facility] Director and he will reach out to their regional liaison to make them aware that the LOG only states "Medicaid" rate and does not state specifics as they are supposed to send invoice to [facility].      Expected Discharge Plan: Skilled Nursing Facility Barriers to Discharge: SNF Pending bed offer  Expected Discharge Plan and Services Expected Discharge Plan: Skilled Nursing Facility In-house Referral: Clinical Social Work   Post Acute Care Choice: Skilled Nursing Facility Living arrangements for the past 2 months: Homeless                                       Social Determinants of Health (SDOH) Interventions    Readmission Risk Interventions     No data to display

## 2022-11-03 NOTE — Progress Notes (Signed)
Physical Therapy Treatment Patient Details Name: Dustin Jones MRN: [id number] DOB: 1961-04-11 Today's Date: 11/03/2022   History of Present Illness The pt is a 61 y.o. unidentified homeless male presenting 9/28 after being found down by bystanders. 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 S/P coil embolization of R ICA aneurysm 9/28. Intubated 9/29. Trach placement 10/10.  Back to ICU 10/15 for respiratory failure, had IVC filter placed 10/26 and PEG.  No PMH on file.    PT Comments    Patient continues with no to very little verbalizations throughout session (even when asked an open-ended question). Progressed to ability to walk with +1 assist with use of RW with Left hand walker splint (despite splint, did require assist to keep left hand on walker).  Noted Left hand with incr edema today, however pt did not answer when asked if he was having any pain in wrist or hand.  Finished with balance/strengthening exercises in standing with pt doing well and without loss of balance.    Recommendations for follow up therapy are one component of a multi-disciplinary discharge planning process, led by the attending physician.  Recommendations may be updated based on patient status, additional functional criteria and insurance authorization.  Follow Up Recommendations  Skilled nursing-short term rehab (<3 hours/day) Can patient physically be transported by private vehicle: No   Assistance Recommended at Discharge Frequent or constant Supervision/Assistance  Patient can return home with the following Assistance with cooking/housework;Assistance with feeding;Direct supervision/assist for medications management;Direct supervision/assist for financial management;Help with stairs or ramp for entrance;Assist for transportation;A little help with walking and/or transfers   Equipment  Recommendations  Other (comment) (TBD)    Recommendations for Other Services       Precautions / Restrictions Precautions Precautions: Fall Precaution Comments: PEG Restrictions Weight Bearing Restrictions: No     Mobility  Bed Mobility Overal bed mobility: Needs Assistance Bed Mobility: Supine to Sit Rolling: Supervision         General bed mobility comments: increased time and able to scoot bottom towards EOB    Transfers Overall transfer level: Needs assistance Equipment used: Rolling walker (2 wheels) Transfers: Sit to/from Stand Sit to Stand: Min guard           General transfer comment: walker splint for LUE with assistance for placement    Ambulation/Gait Ambulation/Gait assistance: Min assist Gait Distance (Feet): 160 Feet Assistive device: Rolling walker (2 wheels) (with left walker splint to facilitate grip and weight bearing through LUE) Gait Pattern/deviations: Step-through pattern, Decreased stride length, Narrow base of support, Drifts right/left Gait velocity: decr     General Gait Details: pt required cues for proximity to RW; tends to push RW toward the left and requires assist to maneuver RW throughout gait;   Stairs             Wheelchair Mobility    Modified Rankin (Stroke Patients Only) Modified Rankin (Stroke Patients Only) Pre-Morbid Rankin Score: No symptoms Modified Rankin: Moderately severe disability     Balance Overall balance assessment: Needs assistance Sitting-balance support: Feet supported Sitting balance-Leahy Scale: Fair Sitting balance - Comments: EOB with feet supported   Standing balance support: Single extremity supported, Bilateral upper extremity supported, During functional activity Standing balance-Leahy Scale: Poor Standing balance comment: able to stand without UE support with close-guarding but no hands-on assist  Cognition Arousal/Alertness:  Awake/alert Behavior During Therapy: Flat affect Overall Cognitive Status: Difficult to assess                               Problem Solving: Slow processing, Requires verbal cues, Requires tactile cues General Comments: increased time to follow commands; despite repeated education, continues to require cues for hand sequencing with sit to stand to RW        Exercises General Exercises - Lower Extremity Mini-Sqauts: AROM, Both, 10 reps, Standing (no UE support) Other Exercises Other Exercises: STSx 5 with left foot slightly posterior to right foot to emphasize use of LLE; no use of RW    General Comments        Pertinent Vitals/Pain Pain Assessment Pain Assessment: Faces Faces Pain Scale: No hurt    Home Living                          Prior Function            PT Goals (current goals can now be found in the care plan section) Acute Rehab PT Goals Patient Stated Goal: agrees to work on ambulation Time For Goal Achievement: 11/03/22 Potential to Achieve Goals: Good Progress towards PT goals: Progressing toward goals (new goals to be set if does not discharge 12/1 as planned)    Frequency    Min 2X/week      PT Plan Current plan remains appropriate    Co-evaluation              AM-PAC PT "6 Clicks" Mobility   Outcome Measure  Help needed turning from your back to your side while in a flat bed without using bedrails?: A Little Help needed moving from lying on your back to sitting on the side of a flat bed without using bedrails?: A Little Help needed moving to and from a bed to a chair (including a wheelchair)?: A Little Help needed standing up from a chair using your arms (e.g., wheelchair or bedside chair)?: A Little Help needed to walk in hospital room?: A Little Help needed climbing 3-5 steps with a railing? : Total 6 Click Score: 16    End of Session Equipment Utilized During Treatment: Gait belt Activity Tolerance:  Patient tolerated treatment well Patient left: with call bell/phone within reach;in chair;with chair alarm set Nurse Communication: Mobility status PT Visit Diagnosis: Other abnormalities of gait and mobility (R26.89);Hemiplegia and hemiparesis;Other symptoms and signs involving the nervous system (R29.898);Muscle weakness (generalized) (M62.81) Hemiplegia - Right/Left: Left Hemiplegia - dominant/non-dominant: Non-dominant Hemiplegia - caused by: Nontraumatic intracerebral hemorrhage     Time: 1200-1219 PT Time Calculation (min) (ACUTE ONLY): 19 min  Charges:  $Gait Training: 8-22 mins                      Arby Barrette, PT Acute Rehabilitation Services  Office [phone]    Rexanne Mano 11/03/2022, 12:32 PM

## 2022-11-03 NOTE — TOC Progression Note (Signed)
Transition of Care [facility]) - Progression Note    Patient Details  Name: Dustin Jones MRN: [id number] Date of Birth: 1961-01-25  [facility]) CM/SW Contact  Tory Emerald, [place] Phone Number: 11/03/2022, 4:05 PM  Clinical Narrative:     CSW conversed with [place] throughout the day regarding pt and barriers. CSW conversed with Ian Malkin about next best steps and it's agreed transportation should be canceled until facility has the needed paperwork. CSW called [employer] transportation and canceled transport for 12/01 at 8am.   Expected Discharge Plan: Skilled Nursing Facility Barriers to Discharge: SNF Pending bed offer  Expected Discharge Plan and Services Expected Discharge Plan: Skilled Nursing Facility In-house Referral: Clinical Social Work   Post Acute Care Choice: Skilled Nursing Facility Living arrangements for the past 2 months: Homeless                                       Social Determinants of Health (SDOH) Interventions    Readmission Risk Interventions     No data to display

## 2022-11-04 DIAGNOSIS — I629 Nontraumatic intracranial hemorrhage, unspecified: Secondary | ICD-10-CM

## 2022-11-04 DIAGNOSIS — Z515 Encounter for palliative care: Secondary | ICD-10-CM

## 2022-11-04 DIAGNOSIS — I610 Nontraumatic intracerebral hemorrhage in hemisphere, subcortical: Principal | ICD-10-CM

## 2022-11-04 DIAGNOSIS — J9601 Acute respiratory failure with hypoxia: Secondary | ICD-10-CM

## 2022-11-04 DIAGNOSIS — Z930 Tracheostomy status: Secondary | ICD-10-CM

## 2022-11-04 DIAGNOSIS — Z7189 Other specified counseling: Secondary | ICD-10-CM

## 2022-11-04 DIAGNOSIS — J95851 Ventilator associated pneumonia: Secondary | ICD-10-CM

## 2022-11-04 DIAGNOSIS — L8990 Pressure ulcer of unspecified site, unspecified stage: Secondary | ICD-10-CM

## 2022-11-04 DIAGNOSIS — J690 Pneumonitis due to inhalation of food and vomit: Secondary | ICD-10-CM

## 2022-11-04 DIAGNOSIS — E870 Hyperosmolality and hypernatremia: Secondary | ICD-10-CM

## 2022-11-04 DIAGNOSIS — R509 Fever, unspecified: Secondary | ICD-10-CM

## 2022-11-04 DIAGNOSIS — D72823 Leukemoid reaction: Secondary | ICD-10-CM

## 2022-11-04 LAB — GLUCOSE, CAPILLARY
Glucose-Capillary: 107 mg/dL — ABNORMAL HIGH (ref 70–99)
Glucose-Capillary: 96 mg/dL (ref 70–99)
Glucose-Capillary: 108 mg/dL — ABNORMAL HIGH (ref 70–99)
Glucose-Capillary: 109 mg/dL — ABNORMAL HIGH (ref 70–99)

## 2022-11-04 NOTE — TOC Progression Note (Signed)
Transition of Care [facility]) - Progression Note    Patient Details  Name: Dustin Jones MRN: [id number] Date of Birth: [birth year]  Transition of Care [facility]) CM/SW Contact  Tory Emerald, [place] Phone Number: 11/04/2022, 3:31 PM  Clinical Narrative:     CSW received a call from [place] at [place]. She reports she spoke with the daughter regarding the paperwork needing to be signed and the daughter stated, "I want additional time to read the paperwork since I am a lawyer". Lauris Poag further added she will be meeting with the daughter on Tuesday to tour the facility.  Expected Discharge Plan: Skilled Nursing Facility Barriers to Discharge: SNF Pending bed offer  Expected Discharge Plan and Services Expected Discharge Plan: Skilled Nursing Facility In-house Referral: Clinical Social Work   Post Acute Care Choice: Skilled Nursing Facility Living arrangements for the past 2 months: Homeless Expected Discharge Date: 11/04/22                                     Social Determinants of Health (SDOH) Interventions    Readmission Risk Interventions     No data to display

## 2022-11-04 NOTE — Discharge Summary (Signed)
Physician Discharge Summary  Dustin Jones QRF:[id number] DOB: [birth year] DOA: 08/25/2022  PCP: Marliss Coots, NP  Admit date: 08/20/2022 Discharge date: 11/04/2022  Admitted From: Home Disposition:  SNF  Recommendations for Outpatient Follow-up:  Follow up with PCP in 1-2 weeks Follow up with Neuro/NeuroSx as scheduled Follow up with Pulmonology as scheduled  Discharge Condition:Stable  CODE STATUS:DNR  Diet recommendation:  Dysphagia 3, thin liquids  Brief/Interim Summary: 61 year old with past medical history significant for hypertension, history of DVT admitted 9/28 as a code stroke, admitted to ICU with working diagnosis of intracranial hemorrhage right MCA ruptured aneurysm, has had prolonged hospitalization including multiple procedures, work-up including right ICA coil embolization of right ICA terminus aneurysm on 08/24/2022, tracheostomy by CCM on 10/10 and transferred to [facility] on 10/22.   9/28 admitted intracranial hemorrhage right MCA ruptured aneurysm status post CT head. 9/29: MRI brain large acute intraparenchymal hemorrhage extension into the right greater than left lateral ventricle, third ventricle and fourth ventricle. 9/29 developed fever and pneumonia, MSSA and respiratory culture white blood cell up to 56.  Treated. 10/15 transferred to the hospitalist service, transferred back to ICU for worsening respiratory status.  Was placed back on vent. 10/26; IVC filter and PEG tube placed by IR. 10/28: After conference call with family CODE STATUS was changed to DNR/DNI do not placed back on vent support. 11/01: Speech recommend Dysphagia 3 diet.  11/07 started Bactrim to cover for PNA.  11/16: Tracheostomy tube was changed to a #6 Shiley flex cuffless  11/19: trach removed by the patient, assessed by CCM recommended to observe without trach 11/20-present: Plan for discharge on 11/04/2022 per case management -patient remains medically stable for discharge  Discharge  Diagnoses:  Principal Problem:   ICH (intracerebral hemorrhage) ([address]) Active Problems:   Intracranial hemorrhage (Dustin Jones)   Pressure injury of skin   Acute respiratory failure with hypoxia (HCC)   Aspiration pneumonia of both lungs (HCC)   Hypernatremia   Tracheostomy dependence (Dustin Jones)   Ventilator associated pneumonia (Dustin Jones)   Leukocytosis   Fever   Palliative care by specialist   Goals of care, counseling/discussion  ICH (intracerebral hemorrhage) (Dustin Jones), right MCA ruptured aneurysm -Status post right ICA coil embolization of the right ICA terminus aneurysm on 08/14/2022 Left-sided weakness appears to be improving somewhat over the past few weeks.   Continue with physical and Occupational Therapy.   Acute respiratory failure with hypoxia secondary to pneumonia, resolved Pneumonia due to MSSA and Enterobacter Underwent tracheostomy by critical care medicine. Has completed multiple courses of antibiotics. Patient was followed by CCM for trach management On 11/6, patient had low-grade fever, leukocytosis, sputum cultures positive for Staph aureus, was placed on Bactrim.  Due to rising creatinine, Bactrim was changed over to meropenem on 11/11, has completed course of antibiotics. Self decannulated on 11/19, RT was unable to place trach back, was recommended to observe without trach by Dr. Valeta Harms. Seems to be doing well without tracheostomy.   Respiratory status is stable.   Acute kidney injury, resolved Creatinine peaked at 1.78 on 11/11.  After the patient was given IV fluids renal function has improved.  Monitor urine output.  Continue to hold ACE inhibitor.   Dysphagia Status post PEG tube placed by IR 10/26, was placed on tube feedings -Has made significant progress with oral intake, speech therapy recommended dysphagia 3 diet on 11/1 -Tolerating dysphagia 3 diet during the day, on nocturnal tube feeds -Changed medications to oral route.   Bilateral DVTs - Not  a candidate for  anticoagulation due to [place] - status post IVC filter in 09/29/2022 by IR.   Essential hypertension Noted to be on amlodipine, hydralazine and metoprolol. Lisinopril was held due to AKI Blood pressure is reasonably well-controlled.   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  Has completed antibiotics course. Now has remained between 50-55 Previous rounding MD consulted hem-onc. D/w Dr Lindi Adie.  Low cytometry and CLL FISH panel was ordered. He will follow up in clinic as scheduled per our last discussion. Flow cytometry: "CLL RELATED CLONE DETECTED . 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 Results for CCND1/IGH, chromosome 12, 13q and TP53 were normal. Deletion of ATM is associated with a less favorable prognosis in patients diagnosed with CLL. SPECIFIC FISH RESULTS:   ATM: ABNORMAL.       nuc ish 11q22.3(ATMx1)[67/100].   CCND1/IGH: NORMAL .       nuc ish 11q13(CCND1x2),14q32(IGHx2)[100].   12cen: NORMAL  .       nuc ish 12cen(D12Z3x2)[100].   13q: NORMAL.       nuc ish 13q14.3(DLEUx2),13q34(TFDP1x2)[100]  .   TP53: NORMAL .       nuc ish 17p13.1(TP53x2)[100]         This analysis is limited to abnormalities detectable by the specific probes included in the study. FISH results should be interpreted within the context of a full cytogenetic analysis and hematologic evaluation. " These findings were discussed with Dr. Lindi Adie.  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  Does not need any treatments.  Outpatient follow-up few weeks after discharge.    Diabetes mellitus type 2 Continue low carb diet   Fall on 11/13, again on 11/18 No obvious injuries noted.     Thrombocytopenia: Resolved Nonsustained V. tach: Continue with metoprolol.  Oral thrush: Completed Diflucan    Obesity Class 3  Estimated body mass index is 37.08 kg/m as calculated from the following:    Height as of this encounter: _0  (1.702 m).   Weight as of this encounter: 107.4 kg.  Discharge Instructions   Allergies as of 11/04/2022   Not on File      Medication List     STOP taking these medications    cloNIDine 0.3 MG tablet Commonly known as: CATAPRES   cyclobenzaprine 10 MG tablet Commonly known as: FLEXERIL   lisinopril 40 MG tablet Commonly known as: ZESTRIL   meloxicam 15 MG tablet Commonly known as: MOBIC   simvastatin 20 MG tablet Commonly known as: ZOCOR       TAKE these medications    acetaminophen 160 MG/5ML solution Commonly known as: TYLENOL Place 20.3 mLs (650 mg total) into feeding tube every 4 (four) hours as needed for mild pain (or temp > 37.5 C (99.5 F)).   amLODipine 10 MG tablet Commonly known as: NORVASC Take 1 tablet (10 mg total) by mouth daily.   famotidine 20 MG tablet Commonly known as: PEPCID Take 1 tablet (20 mg total) by mouth daily.   hydrALAZINE 10 MG tablet Commonly known as: APRESOLINE Take 1 tablet (10 mg total) by mouth 2 (two) times daily. What changed:  medication strength how much to take when to take this   metoprolol tartrate 50 MG tablet Commonly known as: LOPRESSOR Take 1 tablet (50 mg total) by mouth 2 (two) times daily.   revefenacin 175 MCG/3ML nebulizer solution Commonly known as:  YUPELRI Take 3 mLs (175 mcg total) by nebulization daily.        Not on File  Consultations: ICU, Neuro, NeuroSx   Procedures/Studies: DG Chest 1 View  Result Date: 10/11/2022 CLINICAL DATA:  Fever, history of trach. EXAM: CHEST  1 VIEW COMPARISON:  Chest x-ray September 25, 2022. FINDINGS: Low lung volumes with increased mild patchy opacities bilaterally. No visible pleural effusions or pneumothorax. Cardiomediastinal silhouette is accentuated by technique. Tracheostomy 2 tip projects over the tracheal air column at the level of the clavicular heads. IMPRESSION: Low lung volumes with increased mild patchy  opacities bilaterally, which could represent atelectasis, aspiration, and/or pneumonia. Electronically Signed   By: Margaretha Sheffield M.D.   On: 10/11/2022 10:02   DG Swallowing Func-Speech Pathology  Result Date: 10/05/2022 Table formatting from the original result was not included. Objective Swallowing Evaluation: Type of Study: MBS-Modified Barium Swallow Study  Patient Details Name: GEMINI BEAUMIER MRN: [id number] Date of Birth: [birth year] Today's Date: 10/05/2022 Time: SLP Start Time (ACUTE ONLY): 0910 -SLP Stop Time (ACUTE ONLY): 0930 SLP Time Calculation (min) (ACUTE ONLY): 20 min Past Medical History: No past medical history on file. Past Surgical History: Past Surgical History: Procedure Laterality Date  IR ANGIO INTRA EXTRACRAN SEL INTERNAL CAROTID BILAT MOD SED  08/20/2022  IR ANGIOGRAM FOLLOW UP STUDY  08/30/2022  IR ANGIOGRAM FOLLOW UP STUDY  08/12/2022  IR GASTROSTOMY TUBE MOD SED  09/29/2022  IR IVC FILTER PLMT / S&I /IMG GUID/MOD SED  09/29/2022  IR TRANSCATH/EMBOLIZ  08/28/2022  RADIOLOGY WITH ANESTHESIA N/A 09/02/2022  Procedure: IR WITH ANESTHESIA;  Surgeon: Consuella Lose, MD;  Location: [facility];  Service: Radiology;  Laterality: N/A; HPI: Travus Penny is a 62 y.o. male who is homeless, presenting 9/28 after being found down by bystanders. 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 S/P coil embolization of R ICA aneurysm 9/28. Intubated 9/29. Trach placement 10/10. Cortrak 10/11, G-tube 10/26.  No PMH on file.  Subjective: alert, f/c  Recommendations for follow up therapy are one component of a multi-disciplinary discharge planning process, led by the attending physician.  Recommendations may be updated based on patient status, additional functional criteria and insurance authorization. Assessment / Plan / Recommendation   10/05/2022   9:36 AM Clinical Impressions Clinical Impression  The study was completed with PMSV both on and off with thin liquids, but it was donned for all subsequent trials. Pt presents with mild oral phase dyspahgia characterized by impaired posterior propulsion with the 45m barium tablet, left-sided anterior spillage with thin liquids, and mildly prolonged mastication. Pt's swallow mechanism was otherwise WFL without evidence of penetration/aspiration. A dysphagia 3 diet with thin liquids is recommended at this time and SLP will continue to follow pt. SLP Visit Diagnosis Dysphagia, oral phase (R13.11) Impact on safety and function Mild aspiration risk     10/05/2022   9:36 AM Treatment Recommendations Treatment Recommendations Therapy as outlined in treatment plan below     10/05/2022   9:36 AM Prognosis Prognosis for Safe Diet Advancement Good Barriers to Reach Goals Cognitive deficits   10/05/2022   9:36 AM Diet Recommendations SLP Diet Recommendations Dysphagia 3 (Mech soft) solids;Thin liquid Liquid Administration via Cup;Straw Medication Administration Crushed with puree Compensations Slow rate Postural Changes Seated upright at 90 degrees     10/05/2022   9:36 AM Other Recommendations Oral Care Recommendations Oral care BID Follow Up Recommendations Skilled nursing-short term  rehab (<3 hours/day) Assistance recommended at discharge Frequent or constant Supervision/Assistance Functional Status Assessment Patient has had a recent decline in their functional status and demonstrates the ability to make significant improvements in function in a reasonable and predictable amount of time.   10/05/2022   9:36 AM Frequency and Duration  Speech Therapy Frequency (ACUTE ONLY) min 2x/week Treatment Duration 2 weeks     10/05/2022   9:36 AM Oral Phase Oral Phase Impaired Oral - Thin Cup Left anterior bolus loss Oral - Thin Straw Left anterior bolus loss Oral - Puree WFL Oral - Regular Impaired mastication Oral - Pill Reduced posterior propulsion    10/05/2022   9:36 AM Pharyngeal Phase  Pharyngeal Phase [facility]    10/05/2022   9:36 AM Cervical Esophageal Phase  Cervical Esophageal Phase [facility] [facility] Negus, [place], [place] Office number [phone] [name] 10/05/2022, 10:18 AM                       Subjective: No acute issues/events overnight   Discharge Exam: Vitals:   11/04/22 0405 11/04/22 0424  BP: (!) 143/106   Pulse: 73   Resp: 16   Temp: 98.8 F (37.1 C)   SpO2: (!) 86% 96%   Vitals:   11/03/22 2324 11/04/22 0405 11/04/22 0424 11/04/22 0633  BP: 138/89 (!) 143/106    Pulse: 65 73    Resp: 16 16    Temp: 99.2 F (37.3 C) 98.8 F (37.1 C)    TempSrc: Oral Oral    SpO2: 94% (!) 86% 96%   Weight:    104.2 kg  Height:        General: Pt is alert, awake, not in acute distress   The results of significant diagnostics from this hospitalization (including imaging, microbiology, ancillary and laboratory) are listed below for reference.     Microbiology: No results found for this or any previous visit (from the past 240 hour(s)).   Labs: BNP (last 3 results) No results for input(s): "BNP" in the last 8760 hours. Basic Metabolic Panel: Recent Labs  Lab 10/31/22 0337  NA 140  K 4.0  CL 108  CO2 23  GLUCOSE 116*  BUN 12  CREATININE 0.91  CALCIUM 9.1  MG 1.8  PHOS 3.9   Liver Function Tests: No results for input(s): "AST", "ALT", "ALKPHOS", "BILITOT", "PROT", "ALBUMIN" in the last 168 hours. No results for input(s): "LIPASE", "AMYLASE" in the last 168 hours. No results for input(s): "AMMONIA" in the last 168 hours. CBC: Recent Labs  Lab 10/31/22 0337  WBC 47.1*  HGB 11.7*  HCT 36.8*  MCV 92.9  PLT 176   Cardiac Enzymes: No results for input(s): "CKTOTAL", "CKMB", "CKMBINDEX", "TROPONINI" in the last 168 hours. BNP: Invalid input(s): "POCBNP" CBG: Recent Labs  Lab 11/03/22 0624 11/03/22 1139 11/03/22 1621 11/03/22 2142 11/04/22 0628  GLUCAP 104* 112* 94 110* 107*   D-Dimer No results for  input(s): "DDIMER" in the last 72 hours. Hgb A1c No results for input(s): "HGBA1C" in the last 72 hours. Lipid Profile No results for input(s): "CHOL", "HDL", "LDLCALC", "TRIG", "CHOLHDL", "LDLDIRECT" in the last 72 hours. Thyroid function studies No results for input(s): "TSH", "T4TOTAL", "T3FREE", "THYROIDAB" in the last 72 hours.  Invalid input(s): "FREET3" Anemia work up No results for input(s): "VITAMINB12", "FOLATE", "FERRITIN", "TIBC", "IRON", "RETICCTPCT" in the last 72 hours. Urinalysis    Component Value Date/Time   COLORURINE AMBER (A) 09/04/2022 1658  APPEARANCEUR CLOUDY (A) 09/04/2022 1658   LABSPEC 1.023 09/04/2022 1658   PHURINE 5.0 09/04/2022 1658   GLUCOSEU NEGATIVE 09/04/2022 1658   HGBUR MODERATE (A) 09/04/2022 1658   BILIRUBINUR NEGATIVE 09/04/2022 1658   KETONESUR NEGATIVE 09/04/2022 1658   PROTEINUR 100 (A) 09/04/2022 1658   NITRITE NEGATIVE 09/04/2022 1658   LEUKOCYTESUR SMALL (A) 09/04/2022 1658   Sepsis Labs Recent Labs  Lab 10/31/22 0337  WBC 47.1*   Microbiology No results found for this or any previous visit (from the past 240 hour(s)).   Time coordinating discharge: Over 30 minutes  SIGNED:   Little Ishikawa, DO [facility] 11/04/2022, 7:43 AM Pager   If 7PM-7AM, please contact night-coverage [email or web address]

## 2022-11-05 LAB — GLUCOSE, CAPILLARY: Glucose-Capillary: 99 mg/dL (ref 70–99)

## 2022-11-05 NOTE — Progress Notes (Signed)
Physician Discharge Summary  Dustin Jones HYQ:[id number] DOB: 1961-06-22 DOA: 08/28/2022  PCP: Marliss Coots, NP  Admit date: 08/27/2022 Discharge date: 11/05/2022  Admitted From: Home Disposition:  SNF  Recommendations for Outpatient Follow-up:  Follow up with PCP in 1-2 weeks Follow up with Neuro/NeuroSx as scheduled Follow up with Pulmonology as scheduled  Discharge Condition:Stable  CODE STATUS:DNR  Diet recommendation:  Dysphagia 3, thin liquids  Brief/Interim Summary: 61 year old with past medical history significant for hypertension, history of DVT admitted 9/28 as a code stroke, admitted to ICU with working diagnosis of intracranial hemorrhage right MCA ruptured aneurysm, has had prolonged hospitalization including multiple procedures, work-up including right ICA coil embolization of right ICA terminus aneurysm on 08/29/2022, tracheostomy by CCM on 10/10 and transferred to [facility] on 10/22.   9/28 admitted intracranial hemorrhage right MCA ruptured aneurysm status post CT head. 9/29: MRI brain large acute intraparenchymal hemorrhage extension into the right greater than left lateral ventricle, third ventricle and fourth ventricle. 9/29 developed fever and pneumonia, MSSA and respiratory culture white blood cell up to 56.  Treated. 10/15 transferred to the hospitalist service, transferred back to ICU for worsening respiratory status.  Was placed back on vent. 10/26; IVC filter and PEG tube placed by IR. 10/28: After conference call with family CODE STATUS was changed to DNR/DNI do not placed back on vent support. 11/01: Speech recommend Dysphagia 3 diet.  11/07 started Bactrim to cover for PNA.  11/16: Tracheostomy tube was changed to a #6 Shiley flex cuffless  11/19: trach removed by the patient, assessed by CCM recommended to observe without trach 11/20-12/1/23: Plan for discharge on 11/04/2022 per case management -patient remains medically stable for discharge 12/1-present  Patient remains medically stable for discharge - unfortunately family did not fill out necessary paperwork for discharge and they are currently traveling and unable/unwilling to complete paperwork at this time. Once proper paperwork has been completed he can transition to SNF.  Discharge Diagnoses:  Principal Problem:   ICH (intracerebral hemorrhage) (HCC) Active Problems:   Intracranial hemorrhage (HCC)   Pressure injury of skin   Acute respiratory failure with hypoxia (HCC)   Aspiration pneumonia of both lungs (HCC)   Hypernatremia   Tracheostomy dependence (HCC)   Ventilator associated pneumonia (HCC)   Leukocytosis   Fever   Palliative care by specialist   Goals of care, counseling/discussion  ICH (intracerebral hemorrhage) ([place]), right MCA ruptured aneurysm -Status post right ICA coil embolization of the right ICA terminus aneurysm on 08/06/2022 Left-sided weakness appears to be improving somewhat over the past few weeks.   Continue with physical and Occupational Therapy.   Acute respiratory failure with hypoxia secondary to pneumonia, resolved Pneumonia due to MSSA and Enterobacter Underwent tracheostomy by critical care medicine. Has completed multiple courses of antibiotics. Patient was followed by CCM for trach management On 11/6, patient had low-grade fever, leukocytosis, sputum cultures positive for Staph aureus, was placed on Bactrim.  Due to rising creatinine, Bactrim was changed over to meropenem on 11/11, has completed course of antibiotics. Self decannulated on 11/19, RT was unable to place trach back, was recommended to observe without trach by Dr. Valeta Harms. Seems to be doing well without tracheostomy.   Respiratory status is stable.   Acute kidney injury, resolved Creatinine peaked at 1.78 on 11/11.  After the patient was given IV fluids renal function has improved.  Monitor urine output.  Continue to hold ACE inhibitor.   Dysphagia Status post PEG tube placed by IR  10/26, was placed on tube feedings -Has made significant progress with oral intake, speech therapy recommended dysphagia 3 diet on 11/1 -Tolerating dysphagia 3 diet during the day, on nocturnal tube feeds -Changed medications to oral route.   Bilateral DVTs - Not a candidate for anticoagulation due to [place] - status post IVC filter in 09/29/2022 by IR.   Essential hypertension Noted to be on amlodipine, hydralazine and metoprolol. Lisinopril was held due to AKI Blood pressure is reasonably well-controlled.   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  Has completed antibiotics course. Now has remained between 50-55 Previous rounding MD consulted hem-onc. D/w Dr Lindi Adie.  Low cytometry and CLL FISH panel was ordered. He will follow up in clinic as scheduled per our last discussion. Flow cytometry: "CLL RELATED CLONE DETECTED . 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 Results for CCND1/IGH, chromosome 12, 13q and TP53 were normal. Deletion of ATM is associated with a less favorable prognosis in patients diagnosed with CLL. SPECIFIC FISH RESULTS:   ATM: ABNORMAL.       nuc ish 11q22.3(ATMx1)[67/100].   CCND1/IGH: NORMAL .       nuc ish 11q13(CCND1x2),14q32(IGHx2)[100].   12cen: NORMAL  .       nuc ish 12cen(D12Z3x2)[100].   13q: NORMAL.       nuc ish 13q14.3(DLEUx2),13q34(TFDP1x2)[100]  .   TP53: NORMAL .       nuc ish 17p13.1(TP53x2)[100]         This analysis is limited to abnormalities detectable by the specific probes included in the study. FISH results should be interpreted within the context of a full cytogenetic analysis and hematologic evaluation. " These findings were discussed with Dr. Lindi Adie.  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  Does not need any treatments.  Outpatient follow-up few weeks after discharge.    Diabetes mellitus type 2 Continue low carb  diet   Fall on 11/13, again on 11/18 No obvious injuries noted.     Thrombocytopenia: Resolved Nonsustained V. tach: Continue with metoprolol.  Oral thrush: Completed Diflucan    Obesity Class 3  Estimated body mass index is 37.08 kg/m as calculated from the following:   Height as of this encounter: _0  (1.702 m).   Weight as of this encounter: 107.4 kg.  Discharge Instructions   Allergies as of 11/05/2022   Not on File      Medication List     STOP taking these medications    cloNIDine 0.3 MG tablet Commonly known as: CATAPRES   cyclobenzaprine 10 MG tablet Commonly known as: FLEXERIL   lisinopril 40 MG tablet Commonly known as: ZESTRIL   meloxicam 15 MG tablet Commonly known as: MOBIC   simvastatin 20 MG tablet Commonly known as: ZOCOR       TAKE these medications    acetaminophen 160 MG/5ML solution Commonly known as: TYLENOL Place 20.3 mLs (650 mg total) into feeding tube every 4 (four) hours as needed for mild pain (or temp > 37.5 C (99.5 F)).   amLODipine 10 MG tablet Commonly known as: NORVASC Take 1 tablet (10 mg total) by mouth daily.   famotidine 20 MG tablet Commonly known as: PEPCID Take 1 tablet (20 mg total) by mouth daily.   hydrALAZINE 10 MG tablet Commonly known as: APRESOLINE Take 1 tablet (10 mg total) by mouth 2 (two) times daily. What changed:  medication  strength how much to take when to take this   metoprolol tartrate 50 MG tablet Commonly known as: LOPRESSOR Take 1 tablet (50 mg total) by mouth 2 (two) times daily.   revefenacin 175 MCG/3ML nebulizer solution Commonly known as: YUPELRI Take 3 mLs (175 mcg total) by nebulization daily.        Not on File  Consultations: ICU, Neuro, NeuroSx   Procedures/Studies: DG Chest 1 View  Result Date: 10/11/2022 CLINICAL DATA:  Fever, history of trach. EXAM: CHEST  1 VIEW COMPARISON:  Chest x-ray September 25, 2022. FINDINGS: Low lung volumes with increased mild  patchy opacities bilaterally. No visible pleural effusions or pneumothorax. Cardiomediastinal silhouette is accentuated by technique. Tracheostomy 2 tip projects over the tracheal air column at the level of the clavicular heads. IMPRESSION: Low lung volumes with increased mild patchy opacities bilaterally, which could represent atelectasis, aspiration, and/or pneumonia. Electronically Signed   By: Margaretha Sheffield M.D.   On: 10/11/2022 10:02     Subjective: No acute issues/events overnight   Discharge Exam: Vitals:   11/04/22 2306 11/05/22 0352  BP: 119/87 121/71  Pulse: 75 64  Resp: 20 16  Temp: 98.9 F (37.2 C) 99.5 F (37.5 C)  SpO2: 91% 95%   Vitals:   11/04/22 2006 11/04/22 2019 11/04/22 2306 11/05/22 0352  BP: 123/86 118/81 119/87 121/71  Pulse: 81 75 75 64  Resp: _0 Temp: 99.7 F (37.6 C) 99.6 F (37.6 C) 98.9 F (37.2 C) 99.5 F (37.5 C)  TempSrc: Oral Oral Oral Oral  SpO2: 100% 92% 91% 95%  Weight:      Height:        General: Pt is alert, awake, not in acute distress   The results of significant diagnostics from this hospitalization (including imaging, microbiology, ancillary and laboratory) are listed below for reference.     Microbiology: No results found for this or any previous visit (from the past 240 hour(s)).   Labs: BNP (last 3 results) No results for input(s): "BNP" in the last 8760 hours. Basic Metabolic Panel: Recent Labs  Lab 10/31/22 0337  NA 140  K 4.0  CL 108  CO2 23  GLUCOSE 116*  BUN 12  CREATININE 0.91  CALCIUM 9.1  MG 1.8  PHOS 3.9    Liver Function Tests: No results for input(s): "AST", "ALT", "ALKPHOS", "BILITOT", "PROT", "ALBUMIN" in the last 168 hours. No results for input(s): "LIPASE", "AMYLASE" in the last 168 hours. No results for input(s): "AMMONIA" in the last 168 hours. CBC: Recent Labs  Lab 10/31/22 0337  WBC 47.1*  HGB 11.7*  HCT 36.8*  MCV 92.9  PLT 176    Cardiac Enzymes: No results for  input(s): "CKTOTAL", "CKMB", "CKMBINDEX", "TROPONINI" in the last 168 hours. BNP: Invalid input(s): "POCBNP" CBG: Recent Labs  Lab 11/04/22 0628 11/04/22 1202 11/04/22 1623 11/04/22 2133 11/05/22 0609  GLUCAP 107* 96 108* 109* 99    D-Dimer No results for input(s): "DDIMER" in the last 72 hours. Hgb A1c No results for input(s): "HGBA1C" in the last 72 hours. Lipid Profile No results for input(s): "CHOL", "HDL", "LDLCALC", "TRIG", "CHOLHDL", "LDLDIRECT" in the last 72 hours. Thyroid function studies No results for input(s): "TSH", "T4TOTAL", "T3FREE", "THYROIDAB" in the last 72 hours.  Invalid input(s): "FREET3" Anemia work up No results for input(s): "VITAMINB12", "FOLATE", "FERRITIN", "TIBC", "IRON", "RETICCTPCT" in the last 72 hours. Urinalysis    Component Value Date/Time   COLORURINE AMBER (A) 09/04/2022 1658  APPEARANCEUR CLOUDY (A) 09/04/2022 1658   LABSPEC 1.023 09/04/2022 1658   PHURINE 5.0 09/04/2022 1658   GLUCOSEU NEGATIVE 09/04/2022 1658   HGBUR MODERATE (A) 09/04/2022 1658   BILIRUBINUR NEGATIVE 09/04/2022 1658   KETONESUR NEGATIVE 09/04/2022 1658   PROTEINUR 100 (A) 09/04/2022 1658   NITRITE NEGATIVE 09/04/2022 1658   LEUKOCYTESUR SMALL (A) 09/04/2022 1658   Sepsis Labs Recent Labs  Lab 10/31/22 0337  WBC 47.1*    Microbiology No results found for this or any previous visit (from the past 240 hour(s)).   Time coordinating discharge: Over 30 minutes  SIGNED:   Little Ishikawa, DO [facility] 11/05/2022, 7:16 AM Pager   If 7PM-7AM, please contact night-coverage [email or web address]

## 2022-11-06 NOTE — Progress Notes (Signed)
Physician Discharge Summary  Dustin Jones ZOX:[id number] DOB: [birth year] DOA: 08/23/2022  PCP: Marliss Coots, NP  Admit date: 08/20/2022 Discharge date: 11/04/2022  Admitted From: Home Disposition:  SNF  Recommendations for Outpatient Follow-up:  Follow up with PCP in 1-2 weeks Follow up with Neuro/NeuroSx as scheduled Follow up with Pulmonology as scheduled  Discharge Condition:Stable  CODE STATUS:DNR  Diet recommendation:  Dysphagia 3, thin liquids  Brief/Interim Summary: 61 year old with past medical history significant for hypertension, history of DVT admitted 9/28 as a code stroke, admitted to ICU with working diagnosis of intracranial hemorrhage right MCA ruptured aneurysm, has had prolonged hospitalization including multiple procedures, work-up including right 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  9/28: admitted intracranial hemorrhage right MCA ruptured aneurysm status post CT head. 9/29: MRI brain large acute intraparenchymal hemorrhage extension into the right greater than left lateral ventricle, third ventricle and fourth ventricle. 9/29 developed fever and pneumonia, MSSA and respiratory culture white blood cell up to 56. Treated. 10/15 transferred to the hospitalist service, transferred back to ICU for worsening respiratory status.  Was placed back on vent. 10/26; IVC filter and PEG tube placed by IR. 10/28: After conference call with family CODE STATUS was changed to DNR/DNI do not placed back on vent support. 11/01: Speech recommend Dysphagia 3 diet.  11/07 started Bactrim to cover for PNA.  11/16: Tracheostomy tube was changed to a #6 Shiley flex cuffless  11/19: trach removed by the patient, assessed by CCM recommended to observe without trach 11/20-12/1/23: Plan for discharge on 11/04/2022 per case management -patient remains medically stable for discharge 12/1-present  Patient remains medically stable for discharge - unfortunately family did not fill out necessary paperwork for discharge and they are currently traveling and unable/unwilling to complete paperwork at this time. Once proper paperwork has been completed he can transition to SNF.  Discharge Diagnoses:  Principal Problem:   ICH (intracerebral hemorrhage) (HCC) Active Problems:   Intracranial hemorrhage (HCC)   Pressure injury of skin   Acute respiratory failure with hypoxia (HCC)   Aspiration pneumonia of both lungs (HCC)   Hypernatremia   Tracheostomy dependence (HCC)   Ventilator associated pneumonia (HCC)   Leukocytosis   Fever   Palliative care by specialist   Goals of care, counseling/discussion  ICH (intracerebral hemorrhage) ([place]), right MCA ruptured aneurysm -Status post right ICA coil embolization of the right ICA terminus aneurysm on 08/14/2022 Left-sided weakness appears to be improving somewhat over the past few weeks.   Continue with physical and Occupational Therapy.   Acute respiratory failure with hypoxia secondary to pneumonia, resolved Pneumonia due to MSSA and Enterobacter Underwent tracheostomy by critical care medicine. Has completed multiple courses of antibiotics. Patient was followed by CCM for trach management On 11/6, patient had low-grade fever, leukocytosis, sputum cultures positive for Staph aureus, was placed on Bactrim.  Due to rising creatinine, Bactrim was changed over to meropenem on 11/11, has completed course of antibiotics. Self decannulated on 11/19, RT was unable to place trach back, was recommended to observe without trach by Dr. Valeta Harms. Seems to be doing well without tracheostomy.   Respiratory status is stable.   Acute kidney injury, resolved Creatinine peaked at 1.78 on 11/11.  After the patient was given IV fluids renal function has improved.  Monitor urine output.  Continue to hold ACE inhibitor.   Dysphagia Status post PEG tube placed by IR  10/26, was  placed on tube feedings -Has made significant progress with oral intake, speech therapy recommended dysphagia 3 diet on 11/1 -Tolerating dysphagia 3 diet during the day, on nocturnal tube feeds -Changed medications to oral route.   Bilateral DVTs - Not a candidate for anticoagulation due to [place] - status post IVC filter in 09/29/2022 by IR.   Essential hypertension Noted to be on amlodipine, hydralazine and metoprolol. Lisinopril was held due to AKI Blood pressure is reasonably well-controlled.   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  Has completed antibiotics course. Now has remained between 50-55 Previous rounding MD consulted hem-onc. D/w Dr Lindi Adie.  Low cytometry and CLL FISH panel was ordered. He will follow up in clinic as scheduled per our last discussion. Flow cytometry: "CLL RELATED CLONE DETECTED . 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 Results for CCND1/IGH, chromosome 12, 13q and TP53 were normal. Deletion of ATM is associated with a less favorable prognosis in patients diagnosed with CLL. SPECIFIC FISH RESULTS:   ATM: ABNORMAL.       nuc ish 11q22.3(ATMx1)[67/100].   CCND1/IGH: NORMAL .       nuc ish 11q13(CCND1x2),14q32(IGHx2)[100].   12cen: NORMAL  .       nuc ish 12cen(D12Z3x2)[100].   13q: NORMAL.       nuc ish 13q14.3(DLEUx2),13q34(TFDP1x2)[100]  .   TP53: NORMAL .       nuc ish 17p13.1(TP53x2)[100]         This analysis is limited to abnormalities detectable by the specific probes included in the study. FISH results should be interpreted within the context of a full cytogenetic analysis and hematologic evaluation. " These findings were discussed with Dr. Lindi Adie.  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  Does not need any treatments.  Outpatient follow-up few weeks after discharge.    Diabetes mellitus type 2 Continue low carb  diet   Fall on 11/13, again on 11/18 No obvious injuries noted.     Thrombocytopenia: Resolved Nonsustained V. tach: Continue with metoprolol.  Oral thrush: Completed Diflucan    Obesity Class 3  Estimated body mass index is 37.08 kg/m as calculated from the following:   Height as of this encounter: _0  (1.702 m).   Weight as of this encounter: 107.4 kg.  Discharge Instructions   Allergies as of 11/06/2022   Not on File      Medication List     STOP taking these medications    cloNIDine 0.3 MG tablet Commonly known as: CATAPRES   cyclobenzaprine 10 MG tablet Commonly known as: FLEXERIL   lisinopril 40 MG tablet Commonly known as: ZESTRIL   meloxicam 15 MG tablet Commonly known as: MOBIC   simvastatin 20 MG tablet Commonly known as: ZOCOR       TAKE these medications    acetaminophen 160 MG/5ML solution Commonly known as: TYLENOL Place 20.3 mLs (650 mg total) into feeding tube every 4 (four) hours as needed for mild pain (or temp > 37.5 C (99.5 F)).   amLODipine 10 MG tablet Commonly known as: NORVASC Take 1 tablet (10 mg total) by mouth daily.   famotidine 20 MG tablet Commonly known as: PEPCID Take 1 tablet (20 mg total) by mouth daily.   hydrALAZINE 10 MG tablet Commonly known as: APRESOLINE Take 1 tablet (10 mg total) by mouth 2 (two) times daily. What changed:  medication strength how  much to take when to take this   metoprolol tartrate 50 MG tablet Commonly known as: LOPRESSOR Take 1 tablet (50 mg total) by mouth 2 (two) times daily.   revefenacin 175 MCG/3ML nebulizer solution Commonly known as: YUPELRI Take 3 mLs (175 mcg total) by nebulization daily.        Not on File  Consultations: ICU, Neuro, NeuroSx   Procedures/Studies: DG Chest 1 View  Result Date: 10/11/2022 CLINICAL DATA:  Fever, history of trach. EXAM: CHEST  1 VIEW COMPARISON:  Chest x-ray September 25, 2022. FINDINGS: Low lung volumes with increased mild  patchy opacities bilaterally. No visible pleural effusions or pneumothorax. Cardiomediastinal silhouette is accentuated by technique. Tracheostomy 2 tip projects over the tracheal air column at the level of the clavicular heads. IMPRESSION: Low lung volumes with increased mild patchy opacities bilaterally, which could represent atelectasis, aspiration, and/or pneumonia. Electronically Signed   By: Margaretha Sheffield M.D.   On: 10/11/2022 10:02     Subjective: No acute issues/events overnight   Discharge Exam: Vitals:   11/05/22 2318 11/06/22 0333  BP: (!) 137/91 (!) 134/93  Pulse: 69 94  Resp: 16 17  Temp: 98.8 F (37.1 C) 98.9 F (37.2 C)  SpO2: 97% 100%   Vitals:   11/05/22 1951 11/05/22 2318 11/06/22 0333 11/06/22 0500  BP: 124/79 (!) 137/91 (!) 134/93   Pulse: 80 69 94   Resp: _0 Temp: 99.8 F (37.7 C) 98.8 F (37.1 C) 98.9 F (37.2 C)   TempSrc: Oral Oral Axillary   SpO2: 92% 97% 100%   Weight:    110.6 kg  Height:        General: Pt is alert, awake, not in acute distress   The results of significant diagnostics from this hospitalization (including imaging, microbiology, ancillary and laboratory) are listed below for reference.     Microbiology: No results found for this or any previous visit (from the past 240 hour(s)).   Labs: BNP (last 3 results) No results for input(s): "BNP" in the last 8760 hours. Basic Metabolic Panel: Recent Labs  Lab 10/31/22 0337  NA 140  K 4.0  CL 108  CO2 23  GLUCOSE 116*  BUN 12  CREATININE 0.91  CALCIUM 9.1  MG 1.8  PHOS 3.9    Liver Function Tests: No results for input(s): "AST", "ALT", "ALKPHOS", "BILITOT", "PROT", "ALBUMIN" in the last 168 hours. No results for input(s): "LIPASE", "AMYLASE" in the last 168 hours. No results for input(s): "AMMONIA" in the last 168 hours. CBC: Recent Labs  Lab 10/31/22 0337  WBC 47.1*  HGB 11.7*  HCT 36.8*  MCV 92.9  PLT 176    Cardiac Enzymes: No results for  input(s): "CKTOTAL", "CKMB", "CKMBINDEX", "TROPONINI" in the last 168 hours. BNP: Invalid input(s): "POCBNP" CBG: Recent Labs  Lab 11/04/22 0628 11/04/22 1202 11/04/22 1623 11/04/22 2133 11/05/22 0609  GLUCAP 107* 96 108* 109* 99    D-Dimer No results for input(s): "DDIMER" in the last 72 hours. Hgb A1c No results for input(s): "HGBA1C" in the last 72 hours. Lipid Profile No results for input(s): "CHOL", "HDL", "LDLCALC", "TRIG", "CHOLHDL", "LDLDIRECT" in the last 72 hours. Thyroid function studies No results for input(s): "TSH", "T4TOTAL", "T3FREE", "THYROIDAB" in the last 72 hours.  Invalid input(s): "FREET3" Anemia work up No results for input(s): "VITAMINB12", "FOLATE", "FERRITIN", "TIBC", "IRON", "RETICCTPCT" in the last 72 hours. Urinalysis    Component Value Date/Time   COLORURINE AMBER (A) 09/04/2022 1658  APPEARANCEUR CLOUDY (A) 09/04/2022 1658   LABSPEC 1.023 09/04/2022 1658   PHURINE 5.0 09/04/2022 1658   GLUCOSEU NEGATIVE 09/04/2022 1658   HGBUR MODERATE (A) 09/04/2022 1658   BILIRUBINUR NEGATIVE 09/04/2022 1658   KETONESUR NEGATIVE 09/04/2022 1658   PROTEINUR 100 (A) 09/04/2022 1658   NITRITE NEGATIVE 09/04/2022 1658   LEUKOCYTESUR SMALL (A) 09/04/2022 1658   Sepsis Labs Recent Labs  Lab 10/31/22 0337  WBC 47.1*    Microbiology No results found for this or any previous visit (from the past 240 hour(s)).   Time coordinating discharge: Over 30 minutes  SIGNED:   Little Ishikawa, DO [facility] 11/06/2022, 7:28 AM Pager   If 7PM-7AM, please contact night-coverage [email or web address]

## 2022-11-07 LAB — GLUCOSE, CAPILLARY: Glucose-Capillary: 108 mg/dL — ABNORMAL HIGH (ref 70–99)

## 2022-11-07 MED ORDER — SODIUM CHLORIDE 0.9 % IV BOLUS
500.0000 mL | Freq: Once | INTRAVENOUS | Status: AC
  Administered 2022-11-07: 500 mL via INTRAVENOUS

## 2022-11-07 NOTE — Progress Notes (Signed)
Physician Discharge Summary  Dustin Jones FBX:[id number] DOB: [birth year] DOA: 08/11/2022  PCP: Marliss Coots, NP  Admit date: 08/18/2022 Discharge date: 11/04/2022  Admitted From: Home Disposition:  SNF  Recommendations for Outpatient Follow-up:  Follow up with PCP in 1-2 weeks Follow up with Neuro/NeuroSx as scheduled Follow up with Pulmonology as scheduled  Discharge Condition:Stable  CODE STATUS:DNR  Diet recommendation:  Dysphagia 3, thin liquids  Brief/Interim Summary: 61 year old with past medical history significant for hypertension, history of DVT admitted 9/28 as a code stroke, admitted to ICU with working diagnosis of intracranial hemorrhage right MCA ruptured aneurysm, has had prolonged hospitalization including multiple procedures, work-up including right ICA coil embolization of right ICA terminus aneurysm on 08/25/2022, tracheostomy by CCM on 10/10 and transferred to [facility] on 10/22.  9/28: admitted intracranial hemorrhage right MCA ruptured aneurysm status post CT head. 9/29: MRI brain large acute intraparenchymal hemorrhage extension into the right greater than left lateral ventricle, third ventricle and fourth ventricle. 9/29 developed fever and pneumonia, MSSA and respiratory culture white blood cell up to 56. Treated. 10/15 transferred to the hospitalist service, transferred back to ICU for worsening respiratory status.  Was placed back on vent. 10/26; IVC filter and PEG tube placed by IR. 10/28: After conference call with family CODE STATUS was changed to DNR/DNI do not placed back on vent support. 11/01: Speech recommend Dysphagia 3 diet.  11/07 started Bactrim to cover for PNA.  11/16: Tracheostomy tube was changed to a #6 Shiley flex cuffless  11/19: trach removed by the patient, assessed by CCM recommended to observe without trach 11/20-12/1/23: Plan for discharge on 11/04/2022 per case management -patient remains medically stable for discharge 12/1-present  Patient remains medically stable for discharge - unfortunately family did not fill out necessary paperwork for discharge and they are currently traveling and unable/unwilling to complete paperwork at this time. Once proper paperwork has been completed he can transition to SNF.  Discharge Diagnoses:  Principal Problem:   ICH (intracerebral hemorrhage) (HCC) Active Problems:   Intracranial hemorrhage (HCC)   Pressure injury of skin   Acute respiratory failure with hypoxia (HCC)   Aspiration pneumonia of both lungs (HCC)   Hypernatremia   Tracheostomy dependence (HCC)   Ventilator associated pneumonia (HCC)   Leukocytosis   Fever   Palliative care by specialist   Goals of care, counseling/discussion  ICH (intracerebral hemorrhage) ([place]), right MCA ruptured aneurysm -Status post right ICA coil embolization of the right ICA terminus aneurysm on 08/08/2022 Left-sided weakness appears to be improving somewhat over the past few weeks.   Continue with physical and Occupational Therapy.   Acute respiratory failure with hypoxia secondary to pneumonia, resolved Pneumonia due to MSSA and Enterobacter Underwent tracheostomy by critical care medicine. Has completed multiple courses of antibiotics. Patient was followed by CCM for trach management On 11/6, patient had low-grade fever, leukocytosis, sputum cultures positive for Staph aureus, was placed on Bactrim.  Due to rising creatinine, Bactrim was changed over to meropenem on 11/11, has completed course of antibiotics. Self decannulated on 11/19, RT was unable to place trach back, was recommended to observe without trach by Dr. Valeta Harms. Seems to be doing well without tracheostomy.   Respiratory status is stable.   Acute kidney injury, resolved Creatinine peaked at 1.78 on 11/11.  After the patient was given IV fluids renal function has improved.  Monitor urine output.  Continue to hold ACE inhibitor.   Dysphagia Status post PEG tube placed by IR  10/26, was  placed on tube feedings -Has made significant progress with oral intake, speech therapy recommended dysphagia 3 diet on 11/1 -Tolerating dysphagia 3 diet during the day, on nocturnal tube feeds -Changed medications to oral route.   Bilateral DVTs - Not a candidate for anticoagulation due to [place] - status post IVC filter in 09/29/2022 by IR.   Essential hypertension Noted to be on amlodipine, hydralazine and metoprolol. Lisinopril was held due to AKI Blood pressure is reasonably well-controlled.   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  Has completed antibiotics course. Now has remained between 50-55 Previous rounding MD consulted hem-onc. D/w Dr Lindi Adie.  Low cytometry and CLL FISH panel was ordered. He will follow up in clinic as scheduled per our last discussion. Flow cytometry: "CLL RELATED CLONE DETECTED . 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 Results for CCND1/IGH, chromosome 12, 13q and TP53 were normal. Deletion of ATM is associated with a less favorable prognosis in patients diagnosed with CLL. SPECIFIC FISH RESULTS:   ATM: ABNORMAL.       nuc ish 11q22.3(ATMx1)[67/100].   CCND1/IGH: NORMAL .       nuc ish 11q13(CCND1x2),14q32(IGHx2)[100].   12cen: NORMAL  .       nuc ish 12cen(D12Z3x2)[100].   13q: NORMAL.       nuc ish 13q14.3(DLEUx2),13q34(TFDP1x2)[100]  .   TP53: NORMAL .       nuc ish 17p13.1(TP53x2)[100]         This analysis is limited to abnormalities detectable by the specific probes included in the study. FISH results should be interpreted within the context of a full cytogenetic analysis and hematologic evaluation. " These findings were discussed with Dr. Lindi Adie.  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  Does not need any treatments.  Outpatient follow-up few weeks after discharge.    Diabetes mellitus type 2 Continue low carb  diet   Fall on 11/13, again on 11/18 No obvious injuries noted.     Thrombocytopenia: Resolved Nonsustained V. tach: Continue with metoprolol.  Oral thrush: Completed Diflucan    Obesity Class 3  Estimated body mass index is 37.08 kg/m as calculated from the following:   Height as of this encounter: _0  (1.702 m).   Weight as of this encounter: 107.4 kg.  Discharge Instructions   Allergies as of 11/07/2022   Not on File      Medication List     STOP taking these medications    cloNIDine 0.3 MG tablet Commonly known as: CATAPRES   cyclobenzaprine 10 MG tablet Commonly known as: FLEXERIL   lisinopril 40 MG tablet Commonly known as: ZESTRIL   meloxicam 15 MG tablet Commonly known as: MOBIC   simvastatin 20 MG tablet Commonly known as: ZOCOR       TAKE these medications    acetaminophen 160 MG/5ML solution Commonly known as: TYLENOL Place 20.3 mLs (650 mg total) into feeding tube every 4 (four) hours as needed for mild pain (or temp > 37.5 C (99.5 F)).   amLODipine 10 MG tablet Commonly known as: NORVASC Take 1 tablet (10 mg total) by mouth daily.   famotidine 20 MG tablet Commonly known as: PEPCID Take 1 tablet (20 mg total) by mouth daily.   hydrALAZINE 10 MG tablet Commonly known as: APRESOLINE Take 1 tablet (10 mg total) by mouth 2 (two) times daily. What changed:  medication strength how  much to take when to take this   metoprolol tartrate 50 MG tablet Commonly known as: LOPRESSOR Take 1 tablet (50 mg total) by mouth 2 (two) times daily.   revefenacin 175 MCG/3ML nebulizer solution Commonly known as: YUPELRI Take 3 mLs (175 mcg total) by nebulization daily.        Not on File  Consultations: ICU, Neuro, NeuroSx   Procedures/Studies: DG Chest 1 View  Result Date: 10/11/2022 CLINICAL DATA:  Fever, history of trach. EXAM: CHEST  1 VIEW COMPARISON:  Chest x-ray September 25, 2022. FINDINGS: Low lung volumes with increased mild  patchy opacities bilaterally. No visible pleural effusions or pneumothorax. Cardiomediastinal silhouette is accentuated by technique. Tracheostomy 2 tip projects over the tracheal air column at the level of the clavicular heads. IMPRESSION: Low lung volumes with increased mild patchy opacities bilaterally, which could represent atelectasis, aspiration, and/or pneumonia. Electronically Signed   By: Margaretha Sheffield M.D.   On: 10/11/2022 10:02     Subjective: No acute issues/events overnight   Discharge Exam: Vitals:   11/06/22 2355 11/07/22 0410  BP: (!) 112/91 (!) 120/95  Pulse: 79 72  Resp: 17 18  Temp: 99 F (37.2 C) 98.7 F (37.1 C)  SpO2: 93% 99%   Vitals:   11/06/22 1640 11/06/22 2156 11/06/22 2355 11/07/22 0410  BP: (!) 139/93 (!) 120/96 (!) 112/91 (!) 120/95  Pulse: 84 86 79 72  Resp: _0 Temp: 98.8 F (37.1 C) 98.9 F (37.2 C) 99 F (37.2 C) 98.7 F (37.1 C)  TempSrc: Oral Oral Oral Oral  SpO2: 95% 94% 93% 99%  Weight:      Height:        General: Pt is alert, awake, not in acute distress   The results of significant diagnostics from this hospitalization (including imaging, microbiology, ancillary and laboratory) are listed below for reference.     Microbiology: No results found for this or any previous visit (from the past 240 hour(s)).   Labs: BNP (last 3 results) No results for input(s): "BNP" in the last 8760 hours. Basic Metabolic Panel: No results for input(s): "NA", "K", "CL", "CO2", "GLUCOSE", "BUN", "CREATININE", "CALCIUM", "MG", "PHOS" in the last 168 hours.  Liver Function Tests: No results for input(s): "AST", "ALT", "ALKPHOS", "BILITOT", "PROT", "ALBUMIN" in the last 168 hours. No results for input(s): "LIPASE", "AMYLASE" in the last 168 hours. No results for input(s): "AMMONIA" in the last 168 hours. CBC: No results for input(s): "WBC", "NEUTROABS", "HGB", "HCT", "MCV", "PLT" in the last 168 hours.  Cardiac Enzymes: No results  for input(s): "CKTOTAL", "CKMB", "CKMBINDEX", "TROPONINI" in the last 168 hours. BNP: Invalid input(s): "POCBNP" CBG: Recent Labs  Lab 11/04/22 0628 11/04/22 1202 11/04/22 1623 11/04/22 2133 11/05/22 0609  GLUCAP 107* 96 108* 109* 99    D-Dimer No results for input(s): "DDIMER" in the last 72 hours. Hgb A1c No results for input(s): "HGBA1C" in the last 72 hours. Lipid Profile No results for input(s): "CHOL", "HDL", "LDLCALC", "TRIG", "CHOLHDL", "LDLDIRECT" in the last 72 hours. Thyroid function studies No results for input(s): "TSH", "T4TOTAL", "T3FREE", "THYROIDAB" in the last 72 hours.  Invalid input(s): "FREET3" Anemia work up No results for input(s): "VITAMINB12", "FOLATE", "FERRITIN", "TIBC", "IRON", "RETICCTPCT" in the last 72 hours. Urinalysis    Component Value Date/Time   COLORURINE AMBER (A) 09/04/2022 1658   APPEARANCEUR CLOUDY (A) 09/04/2022 1658   LABSPEC 1.023 09/04/2022 1658   PHURINE 5.0 09/04/2022 1658   GLUCOSEU NEGATIVE 09/04/2022 1658  HGBUR MODERATE (A) 09/04/2022 [place] 09/04/2022 1658   KETONESUR NEGATIVE 09/04/2022 1658   PROTEINUR 100 (A) 09/04/2022 1658   NITRITE NEGATIVE 09/04/2022 1658   LEUKOCYTESUR SMALL (A) 09/04/2022 1658   Sepsis Labs No results for input(s): "WBC" in the last 168 hours.  Invalid input(s): "PROCALCITONIN", "LACTICIDVEN"  Microbiology No results found for this or any previous visit (from the past 240 hour(s)).   Time coordinating discharge: Over 30 minutes  SIGNED:   Little Ishikawa, DO [facility] 11/07/2022, 7:25 AM Pager   If 7PM-7AM, please contact night-coverage [email or web address]

## 2022-11-07 NOTE — Significant Event (Signed)
Patient was working with Building control surveyor ambulating in hall and upon returning to room, RN was Radio broadcast assistant and Thayer Ohm asked the patient if he needed to to sit while his sheets were being changed and he said yes. While patient was being assisted to chair, his knees gave out on him and he began to stare off and had an incontinent event of bowel and bladder. Thayer Ohm was able to hold him up  while nurse moved chair behind he but due to his buttock hovering well below the height of the chair, the patient needed to be gently assisted to the ground. VS taken, glucose check, and Rapid called and Dr. Natale Milch notified. Patient was assisted to bed and he was given a 500c NS bolus per Dr. Natale Milch and full assessment completed. After 5 mins patient able to speak and was ao x 4. NIH completed no changes from previous assessment.

## 2022-11-07 NOTE — TOC Progression Note (Signed)
Transition of Care [facility]) - Progression Note    Patient Details  Name: Dustin Jones MRN: [id number] Date of Birth: [birth year]  Transition of [facility]) CM/SW Contact  Carley Hammed, [place] Phone Number: 11/07/2022, 2:55 PM  Clinical Narrative:    CSW notified [facility] supervisor that pt's daughter would like a call, as she has concerns about placement. TOC supervisor spoke with daughter and notified CSW that daughter is now agreeable to plan. Daughter to visit facility tomorrow. CSW completed update and hand off to the oncoming Child psychotherapist. TOC will continue to follow for DC needs.   Expected Discharge Plan: Skilled Nursing Facility Barriers to Discharge: SNF Pending bed offer  Expected Discharge Plan and Services Expected Discharge Plan: Skilled Nursing Facility In-house Referral: Clinical Social Work   Post Acute Care Choice: Skilled Nursing Facility Living arrangements for the past 2 months: Homeless Expected Discharge Date: 11/04/22                                     Social Determinants of Health (SDOH) Interventions    Readmission Risk Interventions     No data to display

## 2022-11-07 NOTE — Significant Event (Signed)
Rapid Response Event Note   Reason for Call :  Syncope Per staff: he had just ambulated in the hall.  When he was back in the room standing he suddenly became to weak to stand and was assisted to lying on the floor.  Initial Focused Assessment:  He is lying in the bed.  He is holding his eyes closed but following commands and answering questions.  Per staff he has a "flat affect" at baseline. No new focal deficit noted. He denies dizziness at this time.  BP 80/60  HR 86  RR 20  O2 sat 100% on RA  Temp 98.5  Lung sounds with faint crackles in left base   Interventions:  Dr Natale Milch came to bedside to assess patient 22 ga PIV 500 cc NS bolus  Plan of Care:  Orthostatic VS  Event Summary:   MD Notified: Natale Milch Call Time: 1333 Arrival Time: 1335 End Time: 1415  Marcellina Millin, RN

## 2022-11-07 NOTE — Progress Notes (Signed)
Mobility Specialist: Progress Note   11/07/22 1355  Mobility  Activity Ambulated with assistance in hallway  Level of Assistance Minimal assist, patient does 75% or more  Assistive Device Front wheel walker  Distance Ambulated (ft) 140 ft  Activity Response Tolerated fair  Mobility Referral Yes  $Mobility charge 1 Mobility   Pt received in the bed and agreeable to mobility. Required minA with bed mobility as well as to stand. No c/o during ambulation but after returning to the room RN changing pts bedding. Pt requesting to sit in the chair. When ambulating to the chair pt had episode of BLE weakness with bowel and urinary incontinence. Unable to make it to the chair requiring controlled descent to the floor. +3 assist to the chair and back to bed. Rapid called and RN present in the room. Vitals assessed by nursing staff.  Chiquita Heckert Mobility Specialist Please contact via SecureChat or Rehab office at [phone]

## 2022-11-08 ENCOUNTER — Other Ambulatory Visit (HOSPITAL_COMMUNITY): Payer: Self-pay

## 2022-11-08 MED ORDER — METOPROLOL TARTRATE 25 MG PO TABS
25.0000 mg | ORAL_TABLET | Freq: Two times a day (BID) | ORAL | Status: DC
  Administered 2022-11-08 – 2022-11-09 (×3): 25 mg via ORAL
  Filled 2022-11-08 (×3): qty 1

## 2022-11-08 MED ORDER — AMLODIPINE BESYLATE 5 MG PO TABS
5.0000 mg | ORAL_TABLET | Freq: Every day | ORAL | Status: DC
  Administered 2022-11-08 – 2022-11-09 (×2): 5 mg via ORAL
  Filled 2022-11-08 (×2): qty 1

## 2022-11-08 MED ORDER — AMLODIPINE BESYLATE 5 MG PO TABS
5.0000 mg | ORAL_TABLET | Freq: Every day | ORAL | 1 refills | Status: AC
  Filled 2022-11-08: qty 30, 30d supply, fill #0

## 2022-11-08 MED ORDER — JEVITY 1.5 CAL/FIBER PO LIQD
237.0000 mL | Freq: Three times a day (TID) | ORAL | Status: DC
  Administered 2022-11-09: 237 mL
  Filled 2022-11-08 (×6): qty 237

## 2022-11-08 MED ORDER — JEVITY 1.5 CAL/FIBER PO LIQD
237.0000 mL | Freq: Three times a day (TID) | ORAL | 0 refills | Status: AC
  Filled 2022-11-08: qty 21330, 30d supply, fill #0

## 2022-11-08 MED ORDER — METOPROLOL TARTRATE 25 MG PO TABS
25.0000 mg | ORAL_TABLET | Freq: Two times a day (BID) | ORAL | 1 refills | Status: AC
  Filled 2022-11-08: qty 60, 30d supply, fill #0

## 2022-11-08 NOTE — Plan of Care (Signed)
  Problem: Education: Goal: Knowledge of General Education information will improve Description: Including pain rating scale, medication(s)/side effects and non-pharmacologic comfort measures Outcome: Progressing   Problem: Health Behavior/Discharge Planning: Goal: Ability to manage health-related needs will improve Outcome: Progressing   Problem: Clinical Measurements: Goal: Ability to maintain clinical measurements within normal limits will improve Outcome: Progressing Goal: Will remain free from infection Outcome: Progressing Goal: Diagnostic test results will improve Outcome: Progressing Goal: Respiratory complications will improve Outcome: Progressing Goal: Cardiovascular complication will be avoided Outcome: Progressing   Problem: Activity: Goal: Risk for activity intolerance will decrease Outcome: Progressing   Problem: Nutrition: Goal: Adequate nutrition will be maintained Outcome: Progressing   Problem: Coping: Goal: Level of anxiety will decrease Outcome: Progressing   Problem: Elimination: Goal: Will not experience complications related to bowel motility Outcome: Progressing Goal: Will not experience complications related to urinary retention Outcome: Progressing   Problem: Pain Managment: Goal: General experience of comfort will improve Outcome: Progressing   Problem: Safety: Goal: Ability to remain free from injury will improve Outcome: Progressing   Problem: Skin Integrity: Goal: Risk for impaired skin integrity will decrease Outcome: Progressing   Problem: Education: Goal: Ability to describe self-care measures that may prevent or decrease complications (Diabetes Survival Skills Education) will improve Outcome: Progressing Goal: Individualized Educational Video(s) Outcome: Progressing   Problem: Coping: Goal: Ability to adjust to condition or change in health will improve Outcome: Progressing   Problem: Fluid Volume: Goal: Ability to  maintain a balanced intake and output will improve Outcome: Progressing   Problem: Health Behavior/Discharge Planning: Goal: Ability to identify and utilize available resources and services will improve Outcome: Progressing Goal: Ability to manage health-related needs will improve Outcome: Progressing   Problem: Metabolic: Goal: Ability to maintain appropriate glucose levels will improve Outcome: Progressing   Problem: Nutritional: Goal: Maintenance of adequate nutrition will improve Outcome: Progressing Goal: Progress toward achieving an optimal weight will improve Outcome: Progressing   Problem: Skin Integrity: Goal: Risk for impaired skin integrity will decrease Outcome: Progressing   Problem: Tissue Perfusion: Goal: Adequacy of tissue perfusion will improve Outcome: Progressing   Problem: Education: Goal: Knowledge of disease or condition will improve Outcome: Progressing Goal: Knowledge of secondary prevention will improve (SELECT ALL) Outcome: Progressing Goal: Knowledge of patient specific risk factors will improve (INDIVIDUALIZE FOR PATIENT) Outcome: Progressing Goal: Individualized Educational Video(s) Outcome: Progressing   Problem: Health Behavior/Discharge Planning: Goal: Ability to manage health-related needs will improve Outcome: Progressing   Problem: Education: Goal: Knowledge about tracheostomy care/management will improve Outcome: Progressing   Problem: Activity: Goal: Ability to tolerate increased activity will improve Outcome: Progressing   Problem: Health Behavior/Discharge Planning: Goal: Ability to manage tracheostomy will improve Outcome: Progressing   Problem: Respiratory: Goal: Patent airway maintenance will improve Outcome: Progressing   Problem: Role Relationship: Goal: Ability to communicate will improve Outcome: Progressing   Problem: Education: Goal: Knowledge of secondary prevention will improve (SELECT ALL) Outcome:  Progressing Goal: Knowledge of patient specific risk factors will improve (INDIVIDUALIZE FOR PATIENT) Outcome: Progressing   Problem: Intracerebral Hemorrhage Tissue Perfusion: Goal: Complications of Intracerebral Hemorrhage will be minimized Outcome: Progressing   

## 2022-11-08 NOTE — TOC Progression Note (Signed)
Transition of Care [facility]) - Progression Note    Patient Details  Name: Dustin Jones MRN: [id number] Date of Birth: December 31, 1960  Transition of Care [facility]) CM/SW Contact  Tory Emerald, [place] Phone Number: 11/08/2022, 4:23 PM  Clinical Narrative:     CSW conversed with Lauris Poag regarding pt's daughter's visit. Lauris Poag reports daughter signed consents; however, facility had questions regarding pt's episode yesterday during PT. CSW called back with RN CM, Tresa Endo to further explain. Lauris Poag reports they need the corrected LOG with the correct amounts before accepting patient. CSW sent Zack, [facility] Director a message via text.   Expected Discharge Plan: Skilled Nursing Facility Barriers to Discharge: SNF Pending bed offer  Expected Discharge Plan and Services Expected Discharge Plan: Skilled Nursing Facility In-house Referral: Clinical Social Work   Post Acute Care Choice: Skilled Nursing Facility Living arrangements for the past 2 months: Homeless Expected Discharge Date: 11/04/22                                     Social Determinants of Health (SDOH) Interventions    Readmission Risk Interventions     No data to display

## 2022-11-08 NOTE — TOC Progression Note (Signed)
Transition of Care [facility]) - Progression Note    Patient Details  Name: Dustin Jones MRN: [id number] Date of Birth: 1960/12/23  Transition of Care [facility]) CM/SW Contact  Tory Emerald, [place] Phone Number: 11/08/2022, 2:30 PM  Clinical Narrative:    CSW spoke with Lauris Poag at [facility]. Lauris Poag reports she has not heard from patient's daughter, further stating pt's daughter was supposed to arrive at 11am. CSW contacted Clyde, pt's daughter who reports she is on the way to facility, running late, and apt was at [address]. CSW called Lauris Poag back to inform her Leavy Cella is in route. CSW later followed up with Lauris Poag who reported daughter was viewing the facility.    Expected Discharge Plan: Skilled Nursing Facility Barriers to Discharge: SNF Pending bed offer  Expected Discharge Plan and Services Expected Discharge Plan: Skilled Nursing Facility In-house Referral: Clinical Social Work   Post Acute Care Choice: Skilled Nursing Facility Living arrangements for the past 2 months: Homeless Expected Discharge Date: 11/04/22                                     Social Determinants of Health (SDOH) Interventions    Readmission Risk Interventions     No data to display

## 2022-11-08 NOTE — Progress Notes (Signed)
Physician Discharge Summary  Dustin Jones XTG:[id number] DOB: 03/27/1961 DOA: 08/10/2022  PCP: Marliss Coots, NP  Admit date: 08/21/2022 Discharge date: 11/04/2022  Admitted From: Home Disposition:  SNF  Recommendations for Outpatient Follow-up:  Follow up with PCP in 1-2 weeks Follow up with Neuro/NeuroSx as scheduled Follow up with Pulmonology as scheduled  Discharge Condition:Stable  CODE STATUS:DNR  Diet recommendation:  Dysphagia 3, thin liquids  Brief/Interim Summary: 61 year old with past medical history significant for hypertension, history of DVT admitted 9/28 as a code stroke, admitted to ICU with working diagnosis of intracranial hemorrhage right MCA ruptured aneurysm, has had prolonged hospitalization including multiple procedures, work-up including right ICA coil embolization of right ICA terminus aneurysm on 08/10/2022, tracheostomy by CCM on 10/10 and transferred to [facility] on 10/22.  9/28: admitted intracranial hemorrhage right MCA ruptured aneurysm status post CT head. 9/29: MRI brain large acute intraparenchymal hemorrhage extension into the right greater than left lateral ventricle, third ventricle and fourth ventricle. 9/29 developed fever and pneumonia, MSSA and respiratory culture white blood cell up to 56. Treated. 10/15 transferred to the hospitalist service, transferred back to ICU for worsening respiratory status.  Was placed back on vent. 10/26; IVC filter and PEG tube placed by IR. 10/28: After conference call with family CODE STATUS was changed to DNR/DNI do not placed back on vent support. 11/01: Speech recommend Dysphagia 3 diet.  11/07 started Bactrim to cover for PNA.  11/16: Tracheostomy tube was changed to a #6 Shiley flex cuffless  11/19: trach removed by the patient, assessed by CCM recommended to observe without trach 11/20-12/1/23: Plan for discharge on 11/04/2022 per case management -patient remains medically stable for discharge 12/1-present  Patient remains medically stable for discharge - unfortunately family did not fill out necessary paperwork for discharge as previously planned 12/1. Awaiting completion of paperwork and family approval for discharge.  Discharge Diagnoses:  Principal Problem:   ICH (intracerebral hemorrhage) (HCC) Active Problems:   Intracranial hemorrhage (HCC)   Pressure injury of skin   Acute respiratory failure with hypoxia (HCC)   Aspiration pneumonia of both lungs (HCC)   Hypernatremia   Tracheostomy dependence (HCC)   Ventilator associated pneumonia (HCC)   Leukocytosis   Fever   Palliative care by specialist   Goals of care, counseling/discussion  ICH (intracerebral hemorrhage) ([place]), right MCA ruptured aneurysm -Status post right ICA coil embolization of the right ICA terminus aneurysm on 08/24/2022 Left-sided weakness appears to be improving somewhat over the past few weeks.   Continue with physical and Occupational Therapy.   Acute respiratory failure with hypoxia secondary to pneumonia, resolved Pneumonia due to MSSA and Enterobacter Underwent tracheostomy by critical care medicine. Has completed multiple courses of antibiotics. Patient was followed by CCM for trach management On 11/6, patient had low-grade fever, leukocytosis, sputum cultures positive for Staph aureus, was placed on Bactrim.  Due to rising creatinine, Bactrim was changed over to meropenem on 11/11, has completed course of antibiotics. Self decannulated on 11/19, RT was unable to place trach back, was recommended to observe without trach by Dr. Valeta Harms. Seems to be doing well without tracheostomy.   Respiratory status is stable.   Acute kidney injury, resolved Creatinine peaked at 1.78 on 11/11.  After the patient was given IV fluids renal function has improved.  Monitor urine output.  Continue to hold ACE inhibitor.   Dysphagia Status post PEG tube placed by IR 10/26, was placed on tube feedings -Has made significant  progress with oral intake,  speech therapy recommended dysphagia 3 diet on 11/1 -Tolerating dysphagia 3 diet during the day, on nocturnal tube feeds -Changed medications to oral route.   Bilateral DVTs - Not a candidate for anticoagulation due to [place] - status post IVC filter in 09/29/2022 by IR.   Essential hypertension Noted to be on amlodipine, hydralazine and metoprolol. Lisinopril was held due to AKI Blood pressure is reasonably well-controlled.   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  Has completed antibiotics course. Now has remained between 50-55 Previous rounding MD consulted hem-onc. D/w Dr Lindi Adie.  Low cytometry and CLL FISH panel was ordered. He will follow up in clinic as scheduled per our last discussion. Flow cytometry: "CLL RELATED CLONE DETECTED . 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 Results for CCND1/IGH, chromosome 12, 13q and TP53 were normal. Deletion of ATM is associated with a less favorable prognosis in patients diagnosed with CLL. SPECIFIC FISH RESULTS:   ATM: ABNORMAL.       nuc ish 11q22.3(ATMx1)[67/100].   CCND1/IGH: NORMAL .       nuc ish 11q13(CCND1x2),14q32(IGHx2)[100].   12cen: NORMAL  .       nuc ish 12cen(D12Z3x2)[100].   13q: NORMAL.       nuc ish 13q14.3(DLEUx2),13q34(TFDP1x2)[100]  .   TP53: NORMAL .       nuc ish 17p13.1(TP53x2)[100]         This analysis is limited to abnormalities detectable by the specific probes included in the study. FISH results should be interpreted within the context of a full cytogenetic analysis and hematologic evaluation. " These findings were discussed with Dr. Lindi Adie.  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  Does not need any treatments.  Outpatient follow-up few weeks after discharge.    Diabetes mellitus type 2 Continue low carb diet   Fall on 11/13, again on 11/18 No obvious  injuries noted.     Thrombocytopenia: Resolved Nonsustained V. tach: Continue with metoprolol.  Oral thrush: Completed Diflucan    Obesity Class 3  Estimated body mass index is 37.08 kg/m as calculated from the following:   Height as of this encounter: _0  (1.702 m).   Weight as of this encounter: 107.4 kg.  Discharge Instructions   Allergies as of 11/08/2022   Not on File      Medication List     STOP taking these medications    cloNIDine 0.3 MG tablet Commonly known as: CATAPRES   cyclobenzaprine 10 MG tablet Commonly known as: FLEXERIL   lisinopril 40 MG tablet Commonly known as: ZESTRIL   meloxicam 15 MG tablet Commonly known as: MOBIC   simvastatin 20 MG tablet Commonly known as: ZOCOR       TAKE these medications    acetaminophen 160 MG/5ML solution Commonly known as: TYLENOL Place 20.3 mLs (650 mg total) into feeding tube every 4 (four) hours as needed for mild pain (or temp > 37.5 C (99.5 F)).   amLODipine 5 MG tablet Commonly known as: NORVASC Take 1 tablet (5 mg total) by mouth daily. Start taking on: November 09, 2022   famotidine 20 MG tablet Commonly known as: PEPCID Take 1 tablet (20 mg total) by mouth daily.   feeding supplement (JEVITY 1.5 CAL/FIBER) Liqd Place 237 mLs into feeding tube 3 (three) times daily with meals.   hydrALAZINE 10 MG tablet Commonly known as: APRESOLINE Take 1 tablet (  10 mg total) by mouth 2 (two) times daily. What changed:  medication strength how much to take when to take this   metoprolol tartrate 25 MG tablet Commonly known as: LOPRESSOR Take 1 tablet (25 mg total) by mouth 2 (two) times daily.   revefenacin 175 MCG/3ML nebulizer solution Commonly known as: YUPELRI Take 3 mLs (175 mcg total) by nebulization daily.       Not on File  Consultations: ICU, Neuro, NeuroSx   Procedures/Studies: DG Chest 1 View  Result Date: 10/11/2022 CLINICAL DATA:  Fever, history of trach. EXAM: CHEST  1  VIEW COMPARISON:  Chest x-ray September 25, 2022. FINDINGS: Low lung volumes with increased mild patchy opacities bilaterally. No visible pleural effusions or pneumothorax. Cardiomediastinal silhouette is accentuated by technique. Tracheostomy 2 tip projects over the tracheal air column at the level of the clavicular heads. IMPRESSION: Low lung volumes with increased mild patchy opacities bilaterally, which could represent atelectasis, aspiration, and/or pneumonia. Electronically Signed   By: Margaretha Sheffield M.D.   On: 10/11/2022 10:02     Subjective: No acute issues/events overnight   Discharge Exam: Vitals:   11/08/22 0730 11/08/22 0801  BP:  120/85  Pulse:  86  Resp:  18  Temp:  98.8 F (37.1 C)  SpO2: 100% 95%   Vitals:   11/08/22 0316 11/08/22 0728 11/08/22 0730 11/08/22 0801  BP: 101/78   120/85  Pulse: 82   86  Resp: 17   18  Temp: 98.2 F (36.8 C)   98.8 F (37.1 C)  TempSrc: Oral     SpO2: 97% 100% 100% 95%  Weight:      Height:        General: Pt is alert, awake, not in acute distress   The results of significant diagnostics from this hospitalization (including imaging, microbiology, ancillary and laboratory) are listed below for reference.     Microbiology: No results found for this or any previous visit (from the past 240 hour(s)).   Labs: BNP (last 3 results) No results for input(s): "BNP" in the last 8760 hours. Basic Metabolic Panel: No results for input(s): "NA", "K", "CL", "CO2", "GLUCOSE", "BUN", "CREATININE", "CALCIUM", "MG", "PHOS" in the last 168 hours.  Liver Function Tests: No results for input(s): "AST", "ALT", "ALKPHOS", "BILITOT", "PROT", "ALBUMIN" in the last 168 hours. No results for input(s): "LIPASE", "AMYLASE" in the last 168 hours. No results for input(s): "AMMONIA" in the last 168 hours. CBC: No results for input(s): "WBC", "NEUTROABS", "HGB", "HCT", "MCV", "PLT" in the last 168 hours.  Cardiac Enzymes: No results for input(s):  "CKTOTAL", "CKMB", "CKMBINDEX", "TROPONINI" in the last 168 hours. BNP: Invalid input(s): "POCBNP" CBG: Recent Labs  Lab 11/04/22 1202 11/04/22 1623 11/04/22 2133 11/05/22 0609 11/07/22 1332  GLUCAP 96 108* 109* 99 108*   D-Dimer No results for input(s): "DDIMER" in the last 72 hours. Hgb A1c No results for input(s): "HGBA1C" in the last 72 hours. Lipid Profile No results for input(s): "CHOL", "HDL", "LDLCALC", "TRIG", "CHOLHDL", "LDLDIRECT" in the last 72 hours. Thyroid function studies No results for input(s): "TSH", "T4TOTAL", "T3FREE", "THYROIDAB" in the last 72 hours.  Invalid input(s): "FREET3" Anemia work up No results for input(s): "VITAMINB12", "FOLATE", "FERRITIN", "TIBC", "IRON", "RETICCTPCT" in the last 72 hours. Urinalysis    Component Value Date/Time   COLORURINE AMBER (A) 09/04/2022 1658   APPEARANCEUR CLOUDY (A) 09/04/2022 1658   LABSPEC 1.023 09/04/2022 1658   PHURINE 5.0 09/04/2022 1658   GLUCOSEU NEGATIVE 09/04/2022 1658  HGBUR MODERATE (A) 09/04/2022 [place] 09/04/2022 1658   KETONESUR NEGATIVE 09/04/2022 1658   PROTEINUR 100 (A) 09/04/2022 1658   NITRITE NEGATIVE 09/04/2022 1658   LEUKOCYTESUR SMALL (A) 09/04/2022 1658   Sepsis Labs No results for input(s): "WBC" in the last 168 hours.  Invalid input(s): "PROCALCITONIN", "LACTICIDVEN"  Microbiology No results found for this or any previous visit (from the past 240 hour(s)).   Time coordinating discharge: Over 30 minutes  SIGNED:   Little Ishikawa, DO [facility] 11/08/2022, 9:57 AM Pager   If 7PM-7AM, please contact night-coverage [email or web address]

## 2022-11-08 NOTE — Progress Notes (Signed)
Physical Therapy Treatment Patient Details Name: TILFORD DEATON MRN: [id number] DOB: 1961/09/18 Today's Date: 11/08/2022   History of Present Illness The pt is a 61 y.o. unidentified homeless male presenting 9/28 after being found down by bystanders. 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 S/P coil embolization of R ICA aneurysm 9/28. Intubated 9/29. Trach placement 10/10.  Back to ICU 10/15 for respiratory failure, had IVC filter placed 10/26 and PEG.  No PMH on file.    PT Comments    Patient noted to have fallen yesterday after ambulating with suspected orthostasis. Patient agreed to complete orthostatic BPs (see vitals flowsheet). Overall, BP did start 133/103 and end at 115/82. Pt denied dizziness throughout. After completed orthostatics, pt refused to ambulate and would not verbalize why. Pt rarely verbalizes and prefers to nod or shake head for yes/no. Offered multiple potential reasons why he did not want to ambulate with pt reporting "no" to all reasons. Agreed to get up in chair and assisted with RW to step-pivot to chair with min assist (maneuvering RW in tight space).     Recommendations for follow up therapy are one component of a multi-disciplinary discharge planning process, led by the attending physician.  Recommendations may be updated based on patient status, additional functional criteria and insurance authorization.  Follow Up Recommendations  Skilled nursing-short term rehab (<3 hours/day) Can patient physically be transported by private vehicle: No   Assistance Recommended at Discharge Frequent or constant Supervision/Assistance  Patient can return home with the following Assistance with cooking/housework;Assistance with feeding;Direct supervision/assist for medications management;Direct supervision/assist for financial management;Help with stairs or ramp for  entrance;Assist for transportation;A little help with walking and/or transfers   Equipment Recommendations  Other (comment) (TBD)    Recommendations for Other Services       Precautions / Restrictions Precautions Precautions: Fall Precaution Comments: PEG Restrictions Weight Bearing Restrictions: No     Mobility  Bed Mobility Overal bed mobility: Needs Assistance Bed Mobility: Supine to Sit     Supine to sit: Supervision, HOB elevated     General bed mobility comments: incr time and effort to scoot out to EOB once sitting upright    Transfers Overall transfer level: Needs assistance Equipment used: Rolling walker (2 wheels) Transfers: Sit to/from Stand, Bed to chair/wheelchair/BSC Sit to Stand: Min guard, Min assist   Step pivot transfers: Min assist       General transfer comment: walker splint for LUE with assistance for placement    Ambulation/Gait               General Gait Details: pt refused to ambulate and would not verbalize why not; shakes head no to all possible reasons (dizzy, in pain, sleepy)   Stairs             Wheelchair Mobility    Modified Rankin (Stroke Patients Only) Modified Rankin (Stroke Patients Only) Pre-Morbid Rankin Score: No symptoms Modified Rankin: Moderately severe disability     Balance Overall balance assessment: Needs assistance Sitting-balance support: Feet supported Sitting balance-Leahy Scale: Fair Sitting balance - Comments: sitting edge of recliner during UE exercises   Standing balance support: Single extremity supported, Bilateral upper extremity supported, During functional activity Standing balance-Leahy Scale: Poor Standing balance comment: Pt requires min guard A to stand without UE support  Cognition Arousal/Alertness: Awake/alert Behavior During Therapy: Flat affect Overall Cognitive Status: Difficult to assess Area of Impairment: Following commands,  Problem solving, Awareness                       Following Commands: Follows one step commands with increased time Safety/Judgement: Decreased awareness of deficits   Problem Solving: Slow processing, Requires verbal cues, Requires tactile cues General Comments: increased time to follow commands; despite repeated education, continues to require cues for hand sequencing with sit to stand to RW        Exercises Other Exercises Other Exercises: refusing further exercises; again would not verbally state why; shaking head no but did agree he wanted to stay up in the chair    General Comments General comments (skin integrity, edema, etc.): see orthostatic BPs      Pertinent Vitals/Pain Pain Assessment Pain Assessment: Faces Faces Pain Scale: No hurt    Home Living                          Prior Function            PT Goals (current goals can now be found in the care plan section) Acute Rehab PT Goals Patient Stated Goal: agrees to get OOB PT Goal Formulation: Patient unable to participate in goal setting (chooses not to verbalize) Time For Goal Achievement: 11/22/22 Potential to Achieve Goals: Good Progress towards PT goals:  (goals updated based on timeframe)    Frequency    Min 2X/week      PT Plan Current plan remains appropriate    Co-evaluation              AM-PAC PT "6 Clicks" Mobility   Outcome Measure  Help needed turning from your back to your side while in a flat bed without using bedrails?: A Little Help needed moving from lying on your back to sitting on the side of a flat bed without using bedrails?: A Little Help needed moving to and from a bed to a chair (including a wheelchair)?: A Little Help needed standing up from a chair using your arms (e.g., wheelchair or bedside chair)?: A Little Help needed to walk in hospital room?: A Little Help needed climbing 3-5 steps with a railing? : Total 6 Click Score: 16    End of  Session Equipment Utilized During Treatment: Gait belt Activity Tolerance: Patient tolerated treatment well Patient left: with call bell/phone within reach;in chair;with chair alarm set Nurse Communication: Mobility status PT Visit Diagnosis: Other abnormalities of gait and mobility (R26.89);Hemiplegia and hemiparesis;Other symptoms and signs involving the nervous system (R29.898);Muscle weakness (generalized) (M62.81) Hemiplegia - Right/Left: Left Hemiplegia - dominant/non-dominant: Non-dominant Hemiplegia - caused by: Nontraumatic intracerebral hemorrhage     Time: 1450-1517 PT Time Calculation (min) (ACUTE ONLY): 27 min  Charges:  $Therapeutic Activity: 23-37 mins                      Arby Barrette, PT Acute Rehabilitation Services  Office [phone]    Rexanne Mano 11/08/2022, 3:32 PM

## 2022-11-08 NOTE — Progress Notes (Signed)
Mobility Specialist Progress Note   11/08/22 1300  Mobility  Activity Refused mobility (Bed level exercises)  Level of Assistance Standby assist, set-up cues, supervision of patient - no hands on  Range of Motion/Exercises Active  Activity Response Tolerated well   Patient received in supine asleep and easily aroused. Very reluctant to participate, declined ambulation or getting to recliner for unspecified reasons, nonverbal and only answering with head nods. Agreed to LE bed level exercises in which he completed x2 but deferred further. Tolerated without complaint or incident. Was left in supine with all needs met, call bell in reach.   [place] Iann Rodier, BS EXP Mobility Specialist Please contact via SecureChat or Rehab office at [phone]

## 2022-11-09 LAB — GLUCOSE, CAPILLARY: Glucose-Capillary: 119 mg/dL — ABNORMAL HIGH (ref 70–99)

## 2022-11-10 ENCOUNTER — Encounter (HOSPITAL_COMMUNITY): Payer: Self-pay | Admitting: *Deleted

## 2022-12-05 NOTE — Death Summary Note (Addendum)
DEATH SUMMARY   Patient Details  Name: Dustin Jones MRN: [id number] DOB: 18-Jul-1961 IRC:VELFYB, [name], NP Admission/Discharge Information   Admit Date:  09-16-22  Date of Death: Date of Death: November 24, 2022  Time of Death: Time of Death: 02-02-1638  Length of Stay: 60   Principle Cause of death: Intracranial Hemorrhage  Hospital Diagnoses:   ICH (intracerebral hemorrhage) ([place])   Pressure injury of skin   Acute respiratory failure with hypoxia (HCC)   Aspiration pneumonia of both lungs (HCC)   Hypernatremia   Tracheostomy dependence ([place])   Ventilator associated pneumonia ([place]) DVT   Brief History: 62 year old with past medical history significant for hypertension, history of DVT admitted Sep 17, 2023 as a code stroke, admitted to ICU with working diagnosis of intracranial hemorrhage right MCA ruptured aneurysm, has had prolonged hospitalization including multiple procedures, work-up including right ICA coil embolization of right ICA terminus aneurysm on Sep 16, 2022, tracheostomy by CCM on 10/10 and transferred to [facility] on 10/222024/10/13 admitted intracranial hemorrhage right MCA ruptured aneurysm status post CT head. 9/29: MRI brain large acute intraparenchymal hemorrhage extension into the right greater than left lateral ventricle, third ventricle and fourth ventricle. 9/29 developed fever and pneumonia, MSSA and respiratory culture white blood cell up to 56.  Treated. 10/15 transferred to the hospitalist service, transferred back to ICU for worsening respiratory status.  Was placed back on vent. 10/26; IVC filter and PEG tube placed by IR. 10/28: After conference call with family CODE STATUS was changed to DNR/DNI do not placed back on vent support. 11/01: Speech recommend Dysphagia 3 diet.  11/07 started Bactrim to cover for PNA.  11/16: Tracheostomy tube was changed to a #6 Shiley flex cuffless  11/19: trach removed by the patient, assessed by CCM, Dr. Valeta Harms, patient able to cough and clear  his throat, phonate, recommended to observe without trach   Hospital Course:  On 2022/11/24 Called by the nursing staff that patient was unresponsive.  Patient apparently was walking with the mobility team when he felt weak and slumped down.  He did not fall onto the floor.  He was helped onto the chair.  Noted to be unresponsive when I arrived at the bedside.  Glucose levels were greater than 100.  Blood pressure noted to be in the 50s and 60s.  Pulse was weak.  Patient was moved onto the bed.  He remained unresponsive.  IV fluids were ordered.  Patient was DNR so no other resuscitative measures were initiated.  Subsequently became apneic and went into cardiac arrest.  Time of death was 4:39pm on 2022/11/24.   Patient's daughter who is the next of kin was notified.  She lives in [place].  She mentioned that she might be interested in an autopsy. After speaking further with ME/Pathologist family decided against autopsy.   ICH (intracerebral hemorrhage) ([place]), right MCA ruptured aneurysm -Status post right ICA coil embolization of the right ICA terminus aneurysm on 2022-09-16   Acute respiratory failure with hypoxia secondary to pneumonia Pneumonia due to MSSA and Enterobacter Underwent tracheostomy by critical care medicine. Has completed multiple courses of antibiotics. Patient was followed by CCM for trach management On 11/6, patient had low-grade fever, leukocytosis, sputum cultures positive for Staph aureus, was placed on Bactrim.  Due to rising creatinine, Bactrim was changed over to meropenem on 11/11, has completed course of antibiotics. Self decannulated on 11/19, RT was unable to place trach back, was recommended to observe without trach by Dr. Valeta Harms.   Acute kidney injury  Creatinine peaked at 1.78 on 11/11.  After the patient was given IV fluids renal function has improved.    Dysphagia Status post PEG tube placed by IR 10/26, was placed on tube feedings -Had made significant  progress with oral intake   Bilateral DVTs - Not a candidate for anticoagulation due to [place] - status post IVC filter in 09/29/2022 by IR.   Essential hypertension   CLL Flow cytometry: "CLL RELATED CLONE DETECTED . 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 Results for CCND1/IGH, chromosome 12, 13q and TP53 were normal. Deletion of ATM is associated with a less favorable prognosis in patients diagnosed with CLL. SPECIFIC FISH RESULTS:   ATM: ABNORMAL.       nuc ish 11q22.3(ATMx1)[67/100].   CCND1/IGH: NORMAL .       nuc ish 11q13(CCND1x2),14q32(IGHx2)[100].   12cen: NORMAL  .       nuc ish 12cen(D12Z3x2)[100].   13q: NORMAL.       nuc ish 13q14.3(DLEUx2),13q34(TFDP1x2)[100]  .   TP53: NORMAL .       nuc ish 17p13.1(TP53x2)[100]         This analysis is limited to abnormalities detectable by the specific probes included in the study. FISH results should be interpreted within the context of a full cytogenetic analysis and hematologic evaluation. " These findings were discussed with Dr. Lindi Adie.  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  Does not need any treatments.     Diabetes mellitus type 2  Thrombocytopenia Nonsustained V. tach   Obesity Class 3  Estimated body mass index is 37.08 kg/m as calculated from the following:   Height as of this encounter: _0  (1.702 m).   Weight as of this encounter: 107.4 kg.      Signed: Bonnielee Haff, MD

## 2022-12-05 NOTE — Progress Notes (Signed)
[facility] PROGRESS NOTE   Cha Cyndi Lennert WUJ:[id number] DOB: March 05, 1961 DOA: 08/18/2022  PCP: Marliss Coots, NP  Brief History/Interval Summary: 62 year old with past medical history significant for hypertension, history of DVT admitted 9/28 as a code stroke, admitted to ICU with working diagnosis of intracranial hemorrhage right MCA ruptured aneurysm, has had prolonged hospitalization including multiple procedures, work-up including right ICA coil embolization of right ICA terminus aneurysm on 08/17/2022, tracheostomy by CCM on 10/10 and transferred to [facility] on 10/22. 9/28 admitted intracranial hemorrhage right MCA ruptured aneurysm status post CT head. 9/29: MRI brain large acute intraparenchymal hemorrhage extension into the right greater than left lateral ventricle, third ventricle and fourth ventricle. 9/29 developed fever and pneumonia, MSSA and respiratory culture white blood cell up to 56.  Treated. 10/15 transferred to the hospitalist service, transferred back to ICU for worsening respiratory status.  Was placed back on vent. 10/26; IVC filter and PEG tube placed by IR. 10/28: After conference call with family CODE STATUS was changed to DNR/DNI do not placed back on vent support. 11/01: Speech recommend Dysphagia 3 diet.  11/07 started Bactrim to cover for PNA.  11/16: Tracheostomy tube was changed to a #6 Shiley flex cuffless  11/19: trach removed by the patient, assessed by CCM, Dr. Valeta Harms, patient able to cough and clear his throat, phonate, recommended to observe without trach   Subjective/Interval History: Patient denies any complaints.  Did have an episode of dizziness while working with physical therapist on 12/4.  Did not have recurrence of symptoms when reevaluated on 12/5.   Assessment/Plan:  ICH (intracerebral hemorrhage) ([place]), right MCA ruptured aneurysm -Status post right ICA coil embolization of the right ICA terminus aneurysm on 08/10/2022 Left-sided  weakness appears to be improving.   Continue with physical and Occupational Therapy.  Waiting on placement to SNF.   Acute respiratory failure with hypoxia secondary to pneumonia Pneumonia due to MSSA and Enterobacter Underwent tracheostomy by critical care medicine. Has completed multiple courses of antibiotics. Patient was followed by CCM for trach management On 11/6, patient had low-grade fever, leukocytosis, sputum cultures positive for Staph aureus, was placed on Bactrim.  Due to rising creatinine, Bactrim was changed over to meropenem on 11/11, has completed course of antibiotics. Self decannulated on 11/19, RT was unable to place trach back, was recommended to observe without trach by Dr. Valeta Harms. Patient is doing well after removal of tracheostomy tube.  Respiratory status is stable.   Acute kidney injury Creatinine peaked at 1.78 on 11/11.  After the patient was given IV fluids renal function has improved.  Monitor urine output.  Continue to hold ACE inhibitor. Labs were normal when checked on 11/27.  Will recheck tomorrow.   Dysphagia Status post PEG tube placed by IR 10/26, was placed on tube feedings -Has made significant progress with oral intake, speech therapy recommended dysphagia 3 diet on 11/1 -Tolerating dysphagia 3 diet . He was on nocturnal tube feedings which have been changed over to 3 times a day.   Bilateral DVTs - Not a candidate for anticoagulation due to [place] - status post IVC filter in 09/29/2022 by IR.   Essential hypertension Noted to be on amlodipine, hydralazine and metoprolol. Lisinopril was held due to AKI Blood pressure has been reasonably well-controlled.  Did have that episode of orthostatic hypotension 2 days ago.  Continue to monitor.  May need adjustments to his blood pressure medication regimen.   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  Has completed antibiotics course. Now has remained between 50-55 Previous  rounding MD consulted hem-onc. D/w Dr Lindi Adie.  Low cytometry and CLL FISH panel was ordered. Flow cytometry: "CLL RELATED CLONE DETECTED . 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 Results for CCND1/IGH, chromosome 12, 13q and TP53 were normal. Deletion of ATM is associated with a less favorable prognosis in patients diagnosed with CLL. SPECIFIC FISH RESULTS:   ATM: ABNORMAL.       nuc ish 11q22.3(ATMx1)[67/100].   CCND1/IGH: NORMAL .       nuc ish 11q13(CCND1x2),14q32(IGHx2)[100].   12cen: NORMAL  .       nuc ish 12cen(D12Z3x2)[100].   13q: NORMAL.       nuc ish 13q14.3(DLEUx2),13q34(TFDP1x2)[100]  .   TP53: NORMAL .       nuc ish 17p13.1(TP53x2)[100]         This analysis is limited to abnormalities detectable by the specific probes included in the study. FISH results should be interpreted within the context of a full cytogenetic analysis and hematologic evaluation. " These findings were discussed with Dr. Lindi Adie.  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  Does not need any treatments.   Outpatient follow-up few weeks after discharge.    Diabetes mellitus type 2 Continue sliding scale, CBG stable   Fall on 11/13, again on 11/18 No obvious injuries noted.     Thrombocytopenia: Resolved Nonsustained V. tach: Continue with metoprolol.  Oral thrush: Completed 5-day course of Diflucan.   Obesity Class 3  Estimated body mass index is 37.08 kg/m as calculated from the following:   Height as of this encounter: _0  (1.702 m).   Weight as of this encounter: 107.4 kg.   DVT Prophylaxis: Subcutaneous heparin Code Status: DNR Family Communication: No family at bedside Disposition Plan: Skilled nursing facility  Status is: Inpatient Remains inpatient appropriate because: Intracranial hemorrhage, dysphagia      Medications: Scheduled:  amLODipine  5 mg Oral Daily   arformoterol  15 mcg  Nebulization BID   famotidine  20 mg Oral Daily   feeding supplement (JEVITY 1.5 CAL/FIBER)  237 mL Per Tube TID WC   heparin injection (subcutaneous)  5,000 Units Subcutaneous Q8H   metoprolol tartrate  25 mg Oral BID   mouth rinse  15 mL Mouth Rinse BID   revefenacin  175 mcg Nebulization Daily   Continuous:  EGB:[id number] **OR** acetaminophen (TYLENOL) oral liquid 160 mg/5 mL **OR** acetaminophen, albuterol, fentaNYL (SUBLIMAZE) injection, mouth rinse, oxyCODONE   Objective:  Vital Signs  Vitals:   11/04/2022 0339 11/07/2022 0825 11/15/2022 0913 11/25/2022 0916  BP: (!) 127/92 (!) 126/98    Pulse: 93 98 (!) 110   Resp:  18 18   Temp: 97.8 F (36.6 C) 98.7 F (37.1 C)    TempSrc: Oral     SpO2: (!) 89% 95% 95% 95%  Weight:      Height:       No intake or output data in the 24 hours ending 11/07/2022 0955  Filed Weights   11/02/22 0500 11/04/22 0633 11/06/22 0500  Weight: 106.2 kg 104.2 kg 110.6 kg   General appearance: Awake alert.  In no distress Resp: Clear to auscultation bilaterally.  Normal effort Cardio: S1-S2 is normal regular.  No S3-S4.  No rubs murmurs or bruit GI: Abdomen is soft.  Nontender nondistended.  Bowel sounds are present normal.  No  masses organomegaly   Lab Results:  Data Reviewed: I have personally reviewed following labs and reports of the imaging studies  CBC: No results for input(s): "WBC", "NEUTROABS", "HGB", "HCT", "MCV", "PLT" in the last 168 hours.   Basic Metabolic Panel: No results for input(s): "NA", "K", "CL", "CO2", "GLUCOSE", "BUN", "CREATININE", "CALCIUM", "MG", "PHOS" in the last 168 hours.   GFR: Estimated Creatinine Clearance: 101.2 mL/min (by C-G formula based on SCr of 0.91 mg/dL).  Liver Function Tests: No results for input(s): "AST", "ALT", "ALKPHOS", "BILITOT", "PROT", "ALBUMIN" in the last 168 hours.   CBG: Recent Labs  Lab 11/04/22 1202 11/04/22 1623 11/04/22 2133 11/05/22 0609 11/07/22 1332  GLUCAP 96  108* 109* 99 108*      Radiology Studies: No results found.     LOS: 52 days   Shayna Eblen [employer] on [email or web address]  11/26/2022, 9:55 AM

## 2022-12-05 NOTE — TOC Progression Note (Addendum)
Transition of Care [facility]) - Progression Note    Patient Details  Name: Dustin Jones MRN: [id number] Date of Birth: 14-Jan-1961  Transition of Care [facility]) CM/SW Contact  Tory Emerald, [place] Phone Number: 11/04/2022, 8:36 AM  Clinical Narrative:    CSW went to Tina's office, TOC admin to discuss the LOG log and it's components, according to [place]. CSW sent Amelia's email to [place], to give to [place]. CSW sent a text to Lauris Poag to confirm email. CSW attempted to call pt's daughter, phone went straight to voicemail.  CSW confirmed with Inetta Fermo that LOG was emailed. CSW attempted to call Lauris Poag; no answer and yet to receive any returned text. CSW was able to converse with pt's daughter regarding current status. Pt's daughter asked about pt's "fall" and wanting restraints. CSW informed dtr she should ask any medical questions to medical treatment team and will f/u with her once transportation is scheduled. CSW contacted [facility] to schedule transportation; however, they report they're down a van and will not be able to transport patient.  CSW called [facility]: no answer CSW called [facility]- no answer CSW called [employer]- they do not have the capacity for a wheelchair CSW called [employer] and left a message for the owner. [facility] called back stating they can do the transport and they have a wheelchair pt can use for transport. CSW scheduled transportation for 8:30am on 12/7 and provided Zack-TOC Director's email for invoice.  CSW called [employer]- they do not do invoices; prepaid only CSW called [employer]; they do not have a wheelchair for use.  CSW spoke to the daughter about the transport to release liability form. She preferred to review the form and can not make it in person. Form was emailed to [email or web address]. Confirmed email address LOG was emailed to and corrected email address for Lauris Poag and asked for receipt of  confirmation.  Pt's daughter reports she received the transport release of liability form and refused to sign the form stating, "I'm not signing this. This is unreasonable for an incapacitated person that is being transported 1.5 hr away." CSW messaged with Timothy Lasso, [facility] Director regarding barriers and discussed response to pt's daughter. CSW asked dtr what her concerns are with the transportation and if she is planning to private pay for transportation, further stating a plan needs to be made if not agreeable to the waiver. Dtr stated, " the plan should be that my dad is transported as planned without the waiver. Im not signing the waiver. As I said before its unreasonable for an incapacitated person that is being transported 1.5 hr away and in the even something happens liability is absolved." Amelia from the facility also reports concerns with the waiver stating the waiver. CSW asked Lauris Poag to have her businenss manager call [facility] to discuss the contract; she reports she will not have time today to make the call.    Expected Discharge Plan: Skilled Nursing Facility Barriers to Discharge: SNF Pending bed offer  Expected Discharge Plan and Services Expected Discharge Plan: Skilled Nursing Facility In-house Referral: Clinical Social Work   Post Acute Care Choice: Skilled Nursing Facility Living arrangements for the past 2 months: Homeless Expected Discharge Date: 11/04/22                                     Social Determinants of Health (SDOH) Interventions  Readmission Risk Interventions     No data to display

## 2022-12-05 NOTE — Progress Notes (Signed)
Mobility Specialist: Progress Note   11/13/2022 1723  Mobility  Activity Ambulated with assistance in hallway  Level of Assistance +2 (takes two people)  Press photographer wheel walker  Distance Ambulated (ft) 120 ft  Activity Response RN notified  Mobility Referral Yes  $Mobility charge 1 Mobility   Post-Mobility: 54/45 (50) BP  Pt received in the bed on bed pan, BM successful, and assisted with pericare. After pericare pt agreeable to mobility. Mod I with bed mobility and modA to stand. Pt completed ADLs at the sink with OT and then ambulated in the hallway. Stopped x1 for seated break secondary to fatigue. Upon entering his room at the end of ambulation pt had BLE buckling with Lt lateral lean requiring maxA to maintain balance and be assisted to the chair. Pt urinary incontinent, fixed gaze to superior L field, significantly increased work of breathing, and unresponsive to verbal stimuli. RN notified and transition care at this time with MD entering ~1 minute after transition of care to RN. Mobility specialist remaining present and assisting to obtain vitals before exiting room as additional nursing/medical staff arrived.   Nimrat Woolworth Mobility Specialist Please contact via SecureChat or Rehab office at [phone]

## 2022-12-05 NOTE — Progress Notes (Signed)
Called by the nursing staff that patient was unresponsive.  Patient apparently was walking with the mobility team when he felt weak and slumped down.  He did not fall onto the floor.  He was helped onto the chair.  Noted to be unresponsive when I arrived at the bedside.  Glucose levels were greater than 100.  Blood pressure noted to be in the 50s and 60s.  Pulse was weak.  Patient was moved onto the bed.  He remained unresponsive.  IV fluids were ordered.  Patient was DNR so no other resuscitative measures were initiated.  Subsequently became apneic and went into cardiac arrest.  Time of death was 4:39pm on 2022/11/28.  Patient's daughter who is the next of kin was notified.  She lives in [place].  She mentioned that she might be interested in an autopsy.  Osvaldo Shipper 2022-11-28

## 2022-12-05 NOTE — Progress Notes (Incomplete Revision)
Called by the nursing staff that patient was unresponsive.  Patient apparently was walking with the mobility team when he felt weak and slumped down.  He did not fall onto the floor.  He was helped onto the chair.  Noted to be unresponsive when I arrived at the bedside.  Glucose levels were greater than 100.  Blood pressure noted to be in the 50s and 60s.  Pulse was weak.  Patient was moved onto the bed.  He remained unresponsive.  IV fluids were ordered.  Patient was DNR so no other resuscitative initiated.  Subsequently became apneic and went into cardiac arrest.  Time of death was 4:39pm on 12-08-2022.  Patient's daughter who is the next of kin was notified.  She lives in [place].  She mentioned that she might be interested in an autopsy.  Osvaldo Shipper Dec 08, 2022

## 2022-12-05 NOTE — Progress Notes (Signed)
Occupational Therapy Treatment Patient Details Name: Dustin Jones MRN: [id number] DOB: 1961/08/16 Today's Date: 11/10/2022   History of present illness The pt is a 62 y.o. unidentified homeless male presenting 9/28 after being found down by bystanders. 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 S/P coil embolization of R ICA aneurysm 9/28. Intubated 9/29. Trach placement 10/10.  Back to ICU 10/15 for respiratory failure, had IVC filter placed 10/26 and PEG.  No PMH on file.   OT comments  Pt continues to progress towards established OT goals. Supine and agreeable to OT session on arrival. Co-treatment with OT and mobility specialist due to report of assist to floor on 12/4. Pt performing STS transfers with mod A. Pt performing grooming tasks at sink with facilitation or LUE weightbearing on sink. Seated rest between washing face and brushing teeth on pt request; OT inquiring regarding dizziness, headache, or pain, but pt denying and reporting lightly fatigued. Pt performing functional mobility with min A with use of RW with L handed saddle splint and chair follow this session. Pt with BLE buckling upon entry back into room, and urinary incontinent, requiring max-total A to sit in chair. PT with fixed gaze, not responding and labored breathing. OT utilizing call bell to request RN who arrived quickly after request in which care was transitioned to RN and then MD. Assisted with obtaining vitals with BP of 55/45 and respiratory rate of 14 just prior to MD arrival; sheet transfer for back to bed. OT and mobility specialist present until additional nursing staff arrived after transition back to bed.    Recommendations for follow up therapy are one component of a multi-disciplinary discharge planning process, led by the attending physician.  Recommendations may be updated based on patient status,  additional functional criteria and insurance authorization.    Follow Up Recommendations  Skilled nursing-short term rehab (<3 hours/day)     Assistance Recommended at Discharge Frequent or constant Supervision/Assistance  Patient can return home with the following  Help with stairs or ramp for entrance;Assist for transportation;Direct supervision/assist for financial management;Direct supervision/assist for medications management;Assistance with cooking/housework;A lot of help with bathing/dressing/bathroom;A lot of help with walking and/or transfers   Equipment Recommendations  Other (comment) (defer to next venue of care)    Recommendations for Other Services      Precautions / Restrictions Precautions Precautions: Fall Precaution Comments: PEG Restrictions Weight Bearing Restrictions: No       Mobility Bed Mobility Overal bed mobility: Needs Assistance Bed Mobility: Supine to Sit Rolling: Supervision         General bed mobility comments: coming to EOB without physical assist. Supervision approaching mod I for safety.    Transfers Overall transfer level: Needs assistance Equipment used: Rolling walker (2 wheels) Transfers: Sit to/from Stand Sit to Stand: +2 safety/equipment, Mod assist           General transfer comment: walker splint for LUE with assistance for placement. Pt required mod A for rise.     Balance Overall balance assessment: Needs assistance Sitting-balance support: Feet supported Sitting balance-Leahy Scale: Fair Sitting balance - Comments: Sitting EOB prior to mobility   Standing balance support: Single extremity supported, Bilateral upper extremity supported, During functional activity Standing balance-Leahy Scale: Poor Standing balance comment: Min A to stand without support  ADL either performed or assessed with clinical judgement   ADL Overall ADL's : Needs assistance/impaired     Grooming:  Wash/dry face;Oral care;Minimal assistance;Sitting Grooming Details (indicate cue type and reason): Min A for balance while standing at sink during washing face and oral care. Pt siting to open toothpaste on request. Min A for problem solving to open toothpaste but with greater ability to grasp toothpaste tube with L hand this session.                 Toilet Transfer: +2 for safety/equipment;Ambulation;Moderate assistance Toilet Transfer Details (indicate cue type and reason): Min A to rise; simulated in room Toileting- Clothing Manipulation and Hygiene: Total assistance;+2 for safety/equipment;+2 for physical assistance;Bed level Toileting - Clothing Manipulation Details (indicate cue type and reason): soiled on arrival/needing to get off of bed pan     Functional mobility during ADLs: Minimal assistance;Rolling walker (2 wheels) General ADL Comments: Min A for fimctional mobility for balance and safety with RW. Use of L handed saddle splint to improve grasp on RW.    Extremity/Trunk Assessment Upper Extremity Assessment Upper Extremity Assessment: LUE deficits/detail RUE Deficits / Details: decreased strength and coordination overall, but able to reach face, brush teeth, etc. LUE Deficits / Details: 2+/5 shoulder flexion. edema in hand improved as compared to prior session LUE Coordination: decreased fine motor;decreased gross motor   Lower Extremity Assessment Lower Extremity Assessment: Defer to PT evaluation        Vision   Additional Comments: Continued decreased attention to R side of environment   Perception Perception Perception: Not tested   Praxis Praxis Praxis: Not tested    Cognition Arousal/Alertness: Awake/alert Behavior During Therapy: Flat affect Overall Cognitive Status: Difficult to assess Area of Impairment: Following commands, Problem solving, Awareness, Safety/judgement                       Following Commands: Follows one step commands  with increased time Safety/Judgement: Decreased awareness of deficits Awareness:  (cont decreased awareness of R side of environment.) Problem Solving: Slow processing, Requires verbal cues, Requires tactile cues General Comments: Increased time to follow commands; cues for sequencing and safety during transfers. Cues to protect LUE during descent to avoid hitting on arm of chair. Pt with greater verbalizations this session. Reporting he feels good and agreeable to mobility.        Exercises      Shoulder Instructions       General Comments Pt reporting he feels good, not dizzy, not in pain following functional mobility into hallway. Pt with one seated rest break and reporting ready to ambulate again. As pt began to turn into room, pt with BLE buckling, urinary incontinence requring max-total A to sit in chair. Pt with decreased eye contact with gaze toward L superior field, increased work of breathing with loud breath sounds. Using call bell to contact RN and RN responding immediately. Transition of care from OT/mobility specialist to RN, while OT and mobility specialist remaining present with RN to obtain vitals and transition to bed. MD entering room during obtaining vitals. OT/mobility specialist remaining present to assist until additional nursing staff entered room. RR of 14 and BP 54/45 (50) just before MD arrival.    Pertinent Vitals/ Pain       Pain Assessment Pain Assessment: No/denies pain Pain Intervention(s): Monitored during session  Home Living  Prior Functioning/Environment              Frequency  Min 2X/week        Progress Toward Goals  OT Goals(current goals can now be found in the care plan section)  Progress towards OT goals: Progressing toward goals  Acute Rehab OT Goals Patient Stated Goal: Get off of bed pan OT Goal Formulation: With patient Time For Goal Achievement: 11/23/22 Potential to  Achieve Goals: Fair ADL Goals Pt Will Perform Grooming: standing;with min guard assist Pt Will Perform Upper Body Dressing: with min assist;sitting Pt Will Perform Lower Body Dressing: with min assist;sit to/from stand;with adaptive equipment Pt Will Transfer to Toilet: ambulating;with supervision;bedside commode Pt/caregiver will Perform Home Exercise Program: Left upper extremity;With minimal assist;With written HEP provided;Increased strength;Increased ROM Additional ADL Goal #1: Pt will complete 3 consecutive grooming tasks standing at sink without seated rest break Additional ADL Goal #2: Pt will follow simple two step command during ADL or UE exercise with no more than 2 cues  Plan Discharge plan remains appropriate;Frequency remains appropriate    Co-evaluation                 AM-PAC OT "6 Clicks" Daily Activity     Outcome Measure   Help from another person eating meals?: A Little Help from another person taking care of personal grooming?: A Lot Help from another person toileting, which includes using toliet, bedpan, or urinal?: A Lot Help from another person bathing (including washing, rinsing, drying)?: A Lot Help from another person to put on and taking off regular upper body clothing?: A Lot Help from another person to put on and taking off regular lower body clothing?: Total 6 Click Score: 12    End of Session Equipment Utilized During Treatment: Gait belt;Rolling walker (2 wheels)  OT Visit Diagnosis: Muscle weakness (generalized) (M62.81);Other symptoms and signs involving the nervous system (R29.898);Other symptoms and signs involving cognitive function   Activity Tolerance Treatment limited secondary to medical complications (Comment) (Decreased responsiveness after walking. RN and MD present.)   Patient Left in bed;with call bell/phone within reach;Other (comment) (with medical team in room)   Nurse Communication Other (comment) (RN in room at end of  session. RN informed regarding session and series of events leading to unresponsiveness.)        Time: 1554-1630 OT Time Calculation (min): 36 min  Charges: OT General Charges $OT Visit: 1 Visit OT Treatments $Self Care/Home Management : 23-37 mins  Tyler Deis, OTR/L [facility] Acute Rehabilitation Office: [phone]    Myrla Halsted 11/06/2022, 5:59 PM

## 2022-12-05 DEATH — deceased
